# Patient Record
Sex: Female | Born: 1980
Health system: Midwestern US, Academic
[De-identification: ages and names within clinical notes are randomized; demographics above are authoritative.]

## PROBLEM LIST (undated history)

## (undated) DIAGNOSIS — E119 Type 2 diabetes mellitus without complications: Secondary | ICD-10-CM

## (undated) DIAGNOSIS — M1990 Unspecified osteoarthritis, unspecified site: Secondary | ICD-10-CM

## (undated) DIAGNOSIS — S92352A Displaced fracture of fifth metatarsal bone, left foot, initial encounter for closed fracture: Secondary | ICD-10-CM

## (undated) DIAGNOSIS — N939 Abnormal uterine and vaginal bleeding, unspecified: Secondary | ICD-10-CM

## (undated) DIAGNOSIS — D497 Neoplasm of unspecified behavior of endocrine glands and other parts of nervous system: Secondary | ICD-10-CM

## (undated) DIAGNOSIS — N83202 Unspecified ovarian cyst, left side: Secondary | ICD-10-CM

## (undated) HISTORY — PX: OTHER SURGICAL HISTORY: SHX169

## (undated) MED FILL — OXYCODONE-ACETAMINOPHEN 5-325 TABS: 5-325 MG | 7 days supply | Qty: 28 | Fill #0 | Status: AC

---

## 2006-06-20 NOTE — Unmapped (Signed)
Signed by   LinkLogic on 06/20/2006 at 13:59:37    Appointment status changed to no show by  LinkLogic on 06/20/2006 1:59 PM.    No Show Comments  ----------------  (ABI) RE-EVALUATION    Appointment Information  -----------------------  Appt Type:         Date:  Monday, June 20, 2006       Time:  1:20 PM for 20 min    Urgency:  Routine    Made By:  LinkLogic   To Visit:  Jamise Pentland L MD     Reason:  (ABI) RE-EVALUATION    Appt Comments  -------------  -- 06/20/06 13:59: (ROYSEMM) NO SHOW --  (ABI) RE-EVALUATION  3 MONTH F/U    -- 03/21/06 15:54: (ROYSEMM) BOOKED --  Routine  at 06/20/2006 1:20 PM for 20 min  (ABI) RE-EVALUATION  3 MONTH F/U

## 2006-07-01 NOTE — Unmapped (Signed)
Signed by   LinkLogic on 07/01/2006 at 05:23:07  Patient: Anne Goodwin  Note: All result statuses are Final unless otherwise noted.    Tests: (1)  (MR)    Order Note:                                        THE [facility]     PATIENT NAME:   Anne Goodwin, Anne Goodwin              MR #:  [id number]  DATE OF BIRTH:  Oct 25, 1981                         ACCOUNT #:  [id number]  ED PHYSICIAN:   Cherly Anderson, M.D.                  ROOM #:  PRIMARY:        [name]. [name], M.D.         NURSING UNIT:  ED  REFERRING:      [name]. [name], M.D.         [facility] : [facility]:  D  DICTATED BY:    Cherly Anderson, M.D.                  ADMIT DATE:  07/01/2006  VISIT DATE:     07/01/2006                         DISCHARGE DATE:                           EMERGENCY DEPARTMENT DISCHARGE NOTE     TIME OF INITIAL EVALUATION:  4:00 a.m.     FINAL IMPRESSION:     1.  Dental pain with a fractured tooth.     HISTORY OF PRESENT ILLNESS:  The patient is a 25 year old patient who  presented to the emergency department with tooth pain.  She has had  intermittent tooth pain of her right molar which has been going on for the  last two to three month period.  She denies any fevers or chills, no  shortness of breath, denies difficulty with breathing.  Denies any problems  with dizziness or lightheadedness.  Denies any problem passing out, blacking  out, fevers or chills.  She has had progressive problems with pain and  discomfort over her right jaw and molar area.  For that reason she came to  the ER to be seen today.     ALLERGIES:  None.     MEDICATIONS:  None.     PAST MEDICAL HISTORY:     1.  Last menstrual flow was one month ago.     SOCIAL HISTORY:  The patient does smoke.  Denies alcohol or drugs of abuse.     FAMILY HISTORY:  Negative for diabetes, coronary artery disease.     REVIEW OF SYSTEMS:  All other review of systems as it pertains to the  patient's chief complaint are negative and noncontributory.     PHYSICAL EXAMINATION:     VITAL SIGNS:   Temperature 98, blood pressure 120/70, pulse 61, respirations  18.  GENERAL:  Well-developed, well-nourished 25 year old.  Awake, alert, lucid,  cooperative with examination.  HEENT:  TMs are clear.  Nares are patent.  Oral mucosa:  She does have right  molar area of focal tenderness on examination.  No signs of any erythema.  No  signs of acute infection and her airway is patent.  Trachea is midline.     EMERGENCY DEPARTMENT COURSE:  Here in the ER after initial history and  physical here, the patient was given Toradol for pain.  She will be given  outpatient treatment with penicillin, Motrin, Tylenol #3 for breakthrough  pain.     PLAN:     1. Outpatient follow with dental clinic.  I have given her a dental clinic     referral.  2. Follow-up as stated above for further care and treatment as outpatient.                                                       ________________________________________  AW/vwf                                ____  D:  07/01/2006 04:56                  Cherly Anderson, M.D.  T:  07/01/2006 05:15  Job #:  [id number]                              EMERGENCY DEPARTMENT DISCHARGE NOTE                                        COPY                   PAGE    1 of 1    Note: An exclamation mark (!) indicates a result that was not dispersed into   the flowsheet.  Document Creation Date: 07/01/2006 5:23 AM  _______________________________________________________________________    (1) Order result status: Final  Collection or observation date-time: 07/01/2006 00:00  Requested date-time:   Receipt date-time:   Reported date-time:   Referring Physician: Oletta Lamas  Ordering Physician:  Reviewed In [facility])  Specimen Source:   Source: DBS  Filler Order Number: [id number]  Lab site:

## 2006-08-01 NOTE — Unmapped (Signed)
Signed by   LinkLogic on 08/01/2006 at 16:43:47  Patient: Shasha CAMPBELL MOXLEY  Note: All result statuses are Final unless otherwise noted.    Tests: (1)  (MR)    Order Note:                                        THE [facility]     PATIENT NAME:   NIKCOLE, EISCHEID              MR #:  [id number]  DATE OF BIRTH:  02-15-1981                         ACCOUNT #:  [id number]  ED PHYSICIAN:   Berneda Rose, M.D.            ROOM #:  PRIMARY:        [name]. [name], M.D.         NURSING UNIT:  ED  REFERRING:      [name]. [name], M.D.         [facility] Hoops:  D  DICTATED BY:    Haskel Khan, P.A.               ADMIT DATE:  08/01/2006  VISIT DATE:                                        DISCHARGE DATE:                           EMERGENCY DEPARTMENT DISCHARGE NOTE     CHIEF COMPLAINT:  Tooth pain.     HISTORY OF PRESENT ILLNESS:  This is a 25 year old Caucasian female with a  past medical history significant for depression who presents to the emergency  department with the above complaint.  The patient states that over the last  couple of months she has had tooth pain from the back right upper molar,  keeps breaking off.  The patient states she was seen here approximately  three weeks to a month ago and given antibiotics and anti-inflammatories.  The patient states that these were helping, however, a few days ago she was  eating a Snickers ice cream when a portion fractured off more significantly.  The patient states she does have an appointment on October 24th with  [facility], however, would like another dental clinic list  to try for an earlier appointment.  The patient states that she ran out of  the anti-inflammatory and has had increased pain.  The patient denies any  fever or chills.  The patient also denies any nausea or vomiting.  The  patient denies any purulent drainage from this area or airway obstruction.  The patient stated to the triage nurse that she was having some signs  of  depression, however, denies any suicidal ideation to me and states that she  has discussed this with her primary physician who has set her up with Dr.  Adair Patter of psychiatry.  The patient had an appointment with Dr. Adair Patter on Friday  and was placed back on antidepressants.  The patient states she already  feels change.  The patient denies any suicidal or homicidal ideation to me.  PAST MEDICAL HISTORY:     1. Depression.     CURRENT MEDICATIONS:     1. Cymbalta 30 mg.  2. Clonazepam 0.5 mg as needed.     ALLERGIES:  No known drug allergies.     SOCIAL HISTORY:  The patient smokes ten cigarettes a day, denies the use of  alcohol, illicit or IV drugs.     FAMILY HISTORY:  Noncontributory.     REVIEW OF SYSTEMS:  As stated above, otherwise negative per patient.     PHYSICAL EXAMINATION:     VITAL SIGNS:  Blood pressure 107/69, pulse 104, repeat 92, respirations 16,  temperature 98.1, O2 saturation 98% on room air.  GENERAL:  This is a well-developed, well-nourished 25 year old female who is  alert and oriented x3 and appears to be in no acute distress.  The patient is  cooperative, communicates well and was ambulatory into the emergency  department.  SKIN:  Warm and dry to touch.  HEENT:  Normocephalic, atraumatic.  Eyes:  Equal, round and reactive to light  and accommodation.  Extraocular movements are intact bilaterally.  Conjunctivae are pink without discharge.  Sclerae are nonicteric.  TMs appear  clear.  Buccal mucosa is pink and moist.  Pharynx is without erythema or  exudate.  The patient overall has good dentition, however, has a few dental  caries noted.  The patient does have a portion of her upper right back second  molar in which a significant portion has fractured off.  The patient has no  gumline erythema or edema noted.  The patient has no purulent drainage noted.  The patient has no evidence of abscess, has a midline uvula with a patent  airway.  NECK:  Supple without lymphadenopathy.  Trachea is  midline.  LUNGS:  Clear to auscultation bilaterally.  No wheezing, rales or rhonchi  noted.  HEART:  Regular rate and rhythm.  No murmurs, rubs or gallops noted.  EXTREMITIES:  The patient moves all extremities without difficulty.  NEUROLOGIC:  Cranial nerves II-XII are intact.  Patient is alert and oriented  x3.  No deficits noted.     EMERGENCY DEPARTMENT COURSE:  The patient was examined by myself and  presented to my attending who also examined the patient.     IMPRESSION:  This is a 25 year old female who presents to the emergency  department with dental pain.  Upon examination, the patient does have a  portion of a back right upper molar which is fractured away most recently a  few days ago and for this I will discharge her with Pen-Vee K.  The patient  is currently three months pregnant and therefore cannot have  anti-inflammatories so I will discharge her with Tylenol No. 3 and have  warned her that this will cause drowsiness.  She is not to drive or drink  alcohol while taking.     DIAGNOSIS:     1. Dental pain.     PLAN:     Penicillin VK 500 mg.  1. Tylenol #3 quantity 15.  2. Warm salt water gargles.  3. Follow up with oral surgery.  A new list was given.  4. Return for any fever, swelling,  airway obstruction or other concerns.     DISPOSITION:  The patient was discharged home in stable condition.     The patient was also seen by Dr. Delford Field who agrees with this assessment and  plan.  _______________________________________  CB/ljw                                 _____  D:  08/01/2006 16:04                   Haskel Khan, P.A.  T:  08/01/2006 16:39  Job #:  [id number]                                         _______________________________________                                         _____                                         Berneda Rose, M.D.  c:   [name]. [name], M.D.                           EMERGENCY DEPARTMENT DISCHARGE  NOTE                                        COPY                   PAGE    1 of 1    Note: An exclamation mark (!) indicates a result that was not dispersed into   the flowsheet.  Document Creation Date: 08/01/2006 4:43 PM  _______________________________________________________________________    (1) Order result status: Final  Collection or observation date-time: 08/01/2006 00:00  Requested date-time:   Receipt date-time:   Reported date-time:   Referring Physician: Oletta Lamas  Ordering Physician:  Reviewed In [facility])  Specimen Source:   Source: DBS  Filler Order Number: [id number]  Lab site:

## 2006-08-01 NOTE — Unmapped (Signed)
Signed by   LinkLogic on 08/01/2006 at 16:28:57  Patient: Zahari CAMPBELL MOXLEY  Note: All result statuses are Final unless otherwise noted.    Tests: (1)  (MR)    Order Note:                                        THE [facility]     PATIENT NAME:   CHANAH, TIDMORE              MR #:  [id number]  DATE OF BIRTH:  [birth year]                         ACCOUNT #:  [id number]  ED PHYSICIAN:   Berneda Rose, M.D.            ROOM #:  PRIMARY:        [name]. [name], M.D.         NURSING UNIT:  ED  REFERRING:      [name]. [name], M.D.         FC:  D  DICTATED BY:    Berneda Rose, M.D.            ADMIT DATE:  08/01/2006  VISIT DATE:                                        DISCHARGE DATE:                           EMERGENCY DEPARTMENT DISCHARGE NOTE     ADDENDUM     Seen and examined the patient, discussed with the PA, agree with plan and  disposition.                                                                   ________________________________________  SWW/ljw                               ____  D:  08/01/2006 15:51                  Berneda Rose, M.D.  T:  08/01/2006 16:24  Job #:  [id number]                              EMERGENCY DEPARTMENT DISCHARGE NOTE                                        COPY                   PAGE    1 of 1    Note: An exclamation mark (!) indicates a result that was not dispersed into   the flowsheet.  Document Creation Date: 08/01/2006 4:28 PM  _______________________________________________________________________    (1) Order  result status: Final  Collection or observation date-time: 08/01/2006 00:00  Requested date-time:   Receipt date-time:   Reported date-time:   Referring Physician: Oletta Lamas  Ordering Physician:  Reviewed In Hospital [facility])  Specimen Source:   Source: DBS  Filler Order Number: [id number]  Lab site:

## 2006-08-10 NOTE — Unmapped (Signed)
Signed by   LinkLogic on 08/10/2006 at 11:27:14    Appointment status changed to no show by  LinkLogic on 08/10/2006 11:27 AM.    No Show Comments  ----------------  (ABI) FOLLOW UP    Appointment Information  -----------------------  Appt Type:         Date:  Wednesday, August 10, 2006       Time:  10:40 AM for 20 min    Urgency:  Routine    Made By:  LinkLogic   To Visit:  Cidney Kirkwood L MD     Reason:  (ABI) FOLLOW UP    Appt Comments  -------------  -- 08/10/06 11:27: (ROYSEMM) NO SHOW --  (ABI) FOLLOW UP  FOLLOW UP    -- 08/01/06 8:54: (FISCHETC) BOOKED --  Routine  at 08/10/2006 10:40 AM for 20 min  (ABI) FOLLOW UP  FOLLOW UP

## 2006-08-29 ENCOUNTER — Inpatient Hospital Stay: Primary: Family

## 2006-09-13 NOTE — Unmapped (Signed)
Signed by   LinkLogic on 09/13/2006 at 16:12:30    Appointment status changed to no show by  LinkLogic on 09/13/2006 4:12 PM.    No Show Comments  ----------------  (ABI) RE-EVALUATION    Appointment Information  -----------------------  Appt Type:         Date:  Tuesday, September 13, 2006       Time:  3:20 PM for 20 min    Urgency:  Routine    Made By:  LinkLogic   To Visit:  Darienne Belleau L MD     Reason:  (ABI) RE-EVALUATION    Appt Comments  -------------  -- 09/13/06 16:12: (PEAKDL) NO SHOW --  (ABI) RE-EVALUATION  F/U    -- 08/29/06 10:26: (ROYSEMM) BOOKED --  Routine  at 09/13/2006 3:20 PM for 20 min  (ABI) RE-EVALUATION  F/U

## 2006-10-20 ENCOUNTER — Inpatient Hospital Stay: Primary: Family

## 2006-12-15 NOTE — Unmapped (Signed)
Signed by   LinkLogic on 12/15/2006 at 15:47:11  Patient: Anne Goodwin  Note: All result statuses are Final unless otherwise noted.    Tests: (1) DIAG-C-SPINE 2 OR 3-VIEWS [phone])    Order NotePricilla Handler Order Number: [id number]    Non-EMR Ordering Provider: Octavia Bruckner     Order Note:     *** VERIFIED [facility]  Reason:  UPPER BACK PAIN  Dict.Staff: Fulton Reek [phone]  Dict.Res: Beryl Meager [id number]  Verified By: Fulton Reek        Ver: 12/15/06   3:47 pm  Exams:  DIAG-C-SPINE 2 OR 3-VIEWS      Thoracic spine, 2 views 12/15/2006.    Indication: Upper back pain.    Comparison: None.    Findings:    The thoracic spine is visualized from T4 to T12. No fracture or  malalignment is identified. Visualized portions of the lungs are  clear.    Impression:    No fractures or malalignment from T4 to T12 are seen.  **** end of result ****    Note: An exclamation mark (!) indicates a result that was not dispersed into   the flowsheet.  Document Creation Date: 12/15/2006 3:47 PM  _______________________________________________________________________    (1) Order result status: Final  Collection or observation date-time: 12/15/2006 10:02:21  Requested date-time: 12/15/2006 09:43:00  Receipt date-time:   Reported date-time: 12/15/2006 15:47:05  Referring Physician: Nelida Gores REFERRAL PT  Ordering Physician:  Non-EMR Physician [facility])  Specimen Source:   Source: QRS  Filler Order Number: [id number]  Lab site: [facility]
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
## 2006-12-15 NOTE — Unmapped (Signed)
Signed by   LinkLogic on 12/16/2006 at 06:02:41  Patient: Anne Goodwin  Note: All result statuses are Final unless otherwise noted.    Tests: (1) DIAG-T-SPINE 2-VIEWS [phone])    Order Note: Pricilla Handler Order Number: [id number]    Non-EMR Ordering Provider: Octavia Bruckner     Order Note:     *** VERIFIED [facility]  Reason:  UPPER BACK PAIN  Dict.Staff: Harvel Ricks [phone]  Dict.Res: Beryl Meager [id number]  Verified By: Harvel Ricks       Ver: 12/16/06   6:02 am  Exams:  DIAG-T-SPINE 2-VIEWS      Cervical spine, 3 views on 12/15/2006.    Indication: Upper back pain.    Comparison: None.    Findings:    The cervical spine is visualized in the skullbase to the  superior endplate of C7, C7 and T1 are incompletely seen. There  has been posterior atlantooccipital fusion using lateral mass  screws at C2. Cervical vertebral body height and alignment is  maintained from the skullbase through C6. No fractures are  identified. The dens is not visualized secondary to overlying  fixation hardware.    Impression cervical spine:    Limited cervical spine with no evidence of fracture or  malalignment from the skull base to C6, however the dens is not  seen secondary to overlying hardware. C7-T1 are excluded.  **** end of result ****    Note: An exclamation mark (!) indicates a result that was not dispersed into   the flowsheet.  Document Creation Date: 12/16/2006 6:02 AM  _______________________________________________________________________    (1) Order result status: Final  Collection or observation date-time: 12/15/2006 10:02:25  Requested date-time: 12/15/2006 09:43:00  Receipt date-time:   Reported date-time: 12/16/2006 06:02:36  Referring Physician: Nelida Gores REFERRAL PT  Ordering Physician:  Non-EMR Physician [facility])  Specimen Source:   Source: QRS  Filler Order Number: [id number]  Lab site: [facility]

## 2006-12-16 ENCOUNTER — Observation Stay
Admit: 2006-12-16 | Discharge: 2006-12-16 | Disposition: A | Discharge status: Home / Self Care | Source: Home / Self Care
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
## 2007-04-20 NOTE — Unmapped (Signed)
Signed by   [employer] on 04/20/2007 at 17:30:28  Patient: Anne Goodwin  Note: All result statuses are Final unless otherwise noted.    Tests: (1) DIAG-C-SPINE 2 OR 3-VIEWS [phone])    Order NotePricilla Handler Order Number: [id number]    Non-EMR Ordering Provider: Melody Comas      Order Note:     *** VERIFIED [facility]  Reason:  H/O SPINAL FUSION  Dict.Staff: Azucena Cecil [phone]  Dict.Res: Marjo Bicker [id number]  Verified By: Azucena Cecil        Ver: 04/20/07   5:30 pm  Exams:  DIAG-C-SPINE 2 OR 3-VIEWS      Three views of the cervical spine 04/20/2007    Indication: Spinal fusion    Comparison: 12/16/2006 and multiple prior studies.    Findings:  The cervical spine is visualized and skullbase through the top  of T1. Postsurgical changes are again noted with posterior  fusion of C2 to the base of the occiput. No acute fracture or  gross alignment is seen. There is straightening of the normal  cervical lordosis. No prevertebral soft tissue swelling is  present. 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    Impression:  Posterior fusion of C2 to the base of the occiput is again  noted. No fracture or malalignment.  **** end of result ****    Note: An exclamation mark (!) indicates a result that was not dispersed into   the flowsheet.  Document Creation Date: 04/20/2007 5:30 PM  _______________________________________________________________________    (1) Order result status: Final  Collection or observation date-time: 04/20/2007 17:27:16  Requested date-time: 04/20/2007 14:46:00  Receipt date-time:   Reported date-time: 04/20/2007 17:30:22  Referring Physician: Nelida Gores REFERRAL PT  Ordering Physician:  Non-EMR Physician [facility])  Specimen Source:   Source: QRS  Filler Order Number: [id number]  Lab site: [facility]
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
## 2007-04-24 NOTE — Unmapped (Signed)
Signed by   LinkLogic on 04/25/2007 at 00:11:38  Patient: Anne Goodwin  Note: All result statuses are Final unless otherwise noted.    Tests: (1)  (MR)    Order Note:                                   [facility]     PATIENT NAME:   Anne, Goodwin             MR #:  [id number]  DATE OF BIRTH:  Feb 11, 1981                        ACCOUNT #:  [id number]  ED PHYSICIAN:   Frederik Schmidt, M.D.       ROOM #:  PRIMARY:        [name]. [name], M.D.        NURSING UNIT:  ED  REFERRING:      Referring Nonstaff                FC:  S  DICTATED BY:    Misty Stanley (Kym Groom) Shadix, P.A.     ADMIT DATE:  04/24/2007  VISIT DATE:     04/24/2007                        DISCHARGE DATE:                           EMERGENCY DEPARTMENT DISCHARGE NOTE     CHIEF COMPLAINT:     1. Headache.  2. Neck pain.     HISTORY OF PRESENT ILLNESS:  This is a 26 year old female who presents with a  migraine headache over the last six hours.  The patient reports she has had a  history of headaches previously and this one is in her right frontal area.  She states she does have photophobia as well some slight nausea but denies  any vomiting.  The patient also complains of some neck pain which has been  occurring over the last several years.  She states she was seen here for this  last week and at that time given Valium and ibuprofen.  She states she was  only given six Valium and is now out of that.  She has been attempting to  make appointments with community clinics but they are unable to see her until  the end of July.  She denies any numbness, tingling or weakness of her  extremities.  She denies any bowel or bladder incontinence or retention.     PAST MEDICAL HISTORY:     1. Bipolar disorder.  2. C1-C2 fusion.     MEDICATIONS:     1. Klonopin.  2. Lithium.  3. Seroquel.  4. Remeron.  5. Ibuprofen.  6. Valium.     ALLERGIES:     1. Latex.     FAMILY HISTORY:  Noncontributory.     SOCIAL HISTORY:  Smokes half a pack of  cigarettes per day but denies alcohol  or illicit drug use.     REVIEW OF SYSTEMS:  As above, otherwise negative per patient.     PHYSICAL EXAMINATION:     VITAL SIGNS:  Blood pressure 116/70, pulse 90, respirations 16, temperature  98.8, and O2 saturation 98% on room air.  GENERAL:  This  is a well-developed, well-nourished female in no acute  distress.  HEENT:  Normocephalic and atraumatic.  PERRLA.  EOMI.  Posterior oropharynx  is pink and moist without erythema or exudate.  NECK:  Supple.  No midline tenderness but she does have some right-sided  paraspinous muscle tenderness and slight spasm noted.  HEART:  Regular rate and rhythm.  No murmurs, gallops or rubs.  LUNGS:  Clear to auscultation bilaterally.  EXTREMITIES:  She has full range of motion of all extremities with 2+ pulses  in all extremities.  No clubbing, cyanosis or edema.  NEUROLOGIC:  She is alert and oriented x 4.  Gross sensation is intact.  Strength is 5/5 bilaterally.     EMERGENCY DEPARTMENT COURSE:  The patient's nursing notes and [facility]  records were reviewed.  She received two Percocet po for pain as well as 60  mg of Toradol IM.     IMPRESSION/MEDICAL DECISION MAKING:  The patient presents with a headache  that started this morning which she believes was due to her chronic neck  pain.  There is no evidence of any infectious etiology of her headache and  there are no meningeal signs.  She will be discharged with some Fioricet for  her pain.  In addition, she states she is out of her Valium and needs some  type of muscle relaxer for her neck pain.  I advised her that we do not  refill Valium here in the emergency department but I would discharge her with  some Flexeril.  She is advised to keep her appointment at the end of July.     DIAGNOSES:     1. Cephalgia.  2. Neck pain.     PLAN:     1. The patient is given Fioricet number 12 and Flexeril number 30.  2. She is to apply ice or heat as needed.  3. Follow up with the community clinic as  scheduled.  4. Return to the ER for any worsening symptoms.     DISPOSITION:  The patient was discharged home in good condition.     The patient was also seen and evaluated by Dr. Clarene Critchley who agrees with the  assessment and plan.                                                                ______________________________________  SS/sm                                 ______  D:  04/24/2007 17:55                  Stacey (Kym Groom) [employer], P.A.  T:  04/24/2007 23:55  Job #:  [id number]                                        ______________________________________                                        ______  Frederik Schmidt, M.D.                              EMERGENCY DEPARTMENT DISCHARGE NOTE                                       COPY                    PAGE    1 of   1    Note: An exclamation mark (!) indicates a result that was not dispersed into   the flowsheet.  Document Creation Date: 04/25/2007 12:11 AM  _______________________________________________________________________    (1) Order result status: Final  Collection or observation date-time: 04/24/2007 00:00  Requested date-time:   Receipt date-time:   Reported date-time:   Referring Physician: Referring Nonstaff  Ordering Physician:  Reviewed In Hospital [facility])  Specimen Source:   Source: DBS  Filler Order Number: [id number]  Lab site:

## 2007-04-25 NOTE — Unmapped (Signed)
THE [facility]     PATIENT NAME:   Anne Goodwin, Anne Goodwin             MR #:  [id number]   DATE OF BIRTH:  [birth year]                        ACCOUNT #:  [id number]   ED PHYSICIAN:   Frederik Schmidt, M.D.       ROOM #:   PRIMARY:        [name]. [name], M.D.        NURSING UNIT:  ED   REFERRING:      Referring Nonstaff                FC:  S   DICTATED BY:    Misty Stanley (Kym Groom) Alyx Gee, P.A.     ADMIT DATE:  04/24/2007   VISIT DATE:     04/24/2007                        DISCHARGE DATE:                           EMERGENCY DEPARTMENT DISCHARGE NOTE     CHIEF COMPLAINT:     1. Headache.   2. Neck pain.     HISTORY OF PRESENT ILLNESS:  This is a 26 year old female who presents with a   migraine headache over the last six hours.  The patient reports she has had a   history of headaches previously and this one is in her right frontal area.   She states she does have photophobia as well some slight nausea but denies   any vomiting.  The patient also complains of some neck pain which has been   occurring over the last several years.  She states she was seen here for this   last week and at that time given Valium and ibuprofen.  She states she was   only given six Valium and is now out of that.  She has been attempting to   make appointments with community clinics but they are unable to see her until   the end of July.  She denies any numbness, tingling or weakness of her   extremities.  She denies any bowel or bladder incontinence or retention.     PAST MEDICAL HISTORY:     1. Bipolar disorder.   2. C1-C2 fusion.     MEDICATIONS:     1. Klonopin.   2. Lithium.   3. Seroquel.   4. Remeron.   5. Ibuprofen.   6. Valium.     ALLERGIES:     1. Latex.     FAMILY HISTORY:  Noncontributory.     SOCIAL HISTORY:  Smokes half a pack of cigarettes per day but denies alcohol   or illicit drug use.     REVIEW OF SYSTEMS:  As above, otherwise negative per patient.     PHYSICAL EXAMINATION:     VITAL  SIGNS:  Blood pressure 116/70, pulse 90, respirations 16, temperature   98.8, and O2 saturation 98% on room air.   GENERAL:  This is a well-developed, well-nourished female in no acute   distress.   HEENT:  Normocephalic and atraumatic.  PERRLA.  EOMI.  Posterior oropharynx   is pink and moist without erythema or exudate.   NECK:  Supple.  No midline tenderness  but she does have some right-sided   paraspinous muscle tenderness and slight spasm noted.   HEART:  Regular rate and rhythm.  No murmurs, gallops or rubs.   LUNGS:  Clear to auscultation bilaterally.   EXTREMITIES:  She has full range of motion of all extremities with 2+ pulses   in all extremities.  No clubbing, cyanosis or edema.   NEUROLOGIC:  She is alert and oriented x 4.  Gross sensation is intact.   Strength is 5/5 bilaterally.     EMERGENCY DEPARTMENT COURSE:  The patient's nursing notes and [facility]   records were reviewed.  She received two Percocet po for pain as well as 60   mg of Toradol IM.     IMPRESSION/MEDICAL DECISION MAKING:  The patient presents with a headache   that started this morning which she believes was due to her chronic neck   pain.  There is no evidence of any infectious etiology of her headache and   there are no meningeal signs.  She will be discharged with some Fioricet for   her pain.  In addition, she states she is out of her Valium and needs some   type of muscle relaxer for her neck pain.  I advised her that we do not   refill Valium here in the emergency department but I would discharge her with   some Flexeril.  She is advised to keep her appointment at the end of July.     DIAGNOSES:     1. Cephalgia.   2. Neck pain.     PLAN:     1. The patient is given Fioricet number 12 and Flexeril number 30.   2. She is to apply ice or heat as needed.   3. Follow up with the community clinic as scheduled.   4. Return to the ER for any worsening symptoms.     DISPOSITION:  The patient was discharged home in good condition.     The  patient was also seen and evaluated by Dr. Clarene Critchley who agrees with the   assessment and plan.                                                         ______________________________________   SS/sm                                 ______   D:  04/24/2007 17:55                  Lylianna Fraiser (Kym Groom) Sumpter, P.A.   T:  04/24/2007 23:55   Job #:  [id number]                                         ______________________________________                                         ______  Frederik Schmidt, M.D.                             EMERGENCY DEPARTMENT DISCHARGE NOTE                                        COPY                    PAGE    1 of   1                                         Frederik Schmidt, M.D.                             EMERGENCY DEPARTMENT DISCHARGE NOTE                                        COPY                    PAGE    1 of   1

## 2007-05-04 ENCOUNTER — Inpatient Hospital Stay: Primary: Family

## 2007-05-09 ENCOUNTER — Inpatient Hospital Stay: Primary: Family

## 2007-05-09 LAB — LIPID PANEL
Cholesterol, Total: 196 mg/dL (ref 125–200)
HDL: 40 mg/dL (ref >=40)
LDL Cholesterol: 88 mg/dL (ref <130)
Triglycerides: 338 mg/dL — ABNORMAL HIGH (ref <150)

## 2007-05-09 LAB — TSH: TSH: 1.27 u[IU]/mL

## 2007-05-09 LAB — LITHIUM LEVEL: Lithium Lvl: 0.8 meq/L (ref 0.5–1.3)

## 2007-05-09 NOTE — Unmapped (Signed)
Signed by   LinkLogic on 05/09/2007 at 20:56:57  Patient: Anne Goodwin  Note: All result statuses are Final unless otherwise noted.    Tests: (1) LIPID PROFILE (FATS)    Triglyceride         [H]  338 mg/dL                   <093      Test performed at [facility]      [address]      [place], [place]  [address]      Anselm Pancoast BECKER,D.O.    Cholesterol               196 mg/dL                   254-270      Test performed at [facility]      [address]      [place], [place]  [address]      Anselm Pancoast BECKER,D.O.    HDL                       40 mg/dL                    > OR = 40      Test performed at [facility]      [address]      [place], [place]  [address]      Anselm Pancoast BECKER,D.O.    LDL, calc                 88 mg/dL (calc)             <710      DESIRABLE RANGE <100 MG/DL FOR PATIENTS WITH CHD OR      DIABETES AND <70 MG/DL FOR DIABETIC PATIENTS WITH      KNOWN HEART DISEASE.      Test performed at [facility]      [address]      [place], [place]  [address]      Anselm Pancoast BECKER,D.O.      The value 88 was changed by IF on 05/09/2007 20:56 from: no value  ! CHOL/HDLC RATIO           4.9 (calc)                  < OR = 5.0      Test performed at [facility]      [address]      [place], [place]  [address]      Anselm Pancoast BECKER,D.O.      The value 4.9 was changed by IF on 05/09/2007 20:56 from: no value    Note: An exclamation mark (!) indicates a result that was not dispersed into   the flowsheet.  Document Creation Date: 05/09/2007 8:56 PM  _______________________________________________________________________    (1) Order result status: Final  Collection or observation date-time: 05/09/2007 12:26  Requested date-time: 05/09/2007 12:26  Receipt date-time: 05/09/2007 20:56  Reported date-time: 05/09/2007 20:56  Referring Physician:    Ordering Physician: Gardiner Coins [phone])  Specimen  Source: S&SERUM     SST  ROOM T&SST (serum Rm Tmp)  Source: Butler Denmark Order Number: [id number]  Lab site: [facility]      [address]      [place] [address]  [phone]      -----------------  The following lab values were dispersed to the flowsheet  with no units conversion:      LDL, calc, 88 MG/DL (CALC), (F)  expected units: mg/dL

## 2007-05-09 NOTE — Unmapped (Signed)
Signed by   LinkLogic on 05/10/2007 at 01:43:39  Patient: Anne Goodwin  Note: All result statuses are Final unless otherwise noted.    Tests: (1) LITHIUM (LI)    Lithium                   0.8 mEq/L                   0.5-1.3      Test performed at [facility]      [address]      [place], [place]  [address]      Anselm Pancoast BECKER,D.O.    Note: An exclamation mark (!) indicates a result that was not dispersed into   the flowsheet.  Document Creation Date: 05/10/2007 1:43 AM  _______________________________________________________________________    (1) Order result status: Final  Collection or observation date-time: 05/09/2007 12:26  Requested date-time: 05/09/2007 12:26  Receipt date-time: 05/10/2007 01:43  Reported date-time: 05/10/2007 01:43  Referring Physician:    Ordering Physician: Gardiner Coins [phone])  Specimen Source: S&SERUM     SST  ROOM T&SST (serum Rm Tmp)  Source: Butler Denmark Order Number: [id number]  Lab site: [facility]      [address]      [place] [address]  [phone]

## 2007-05-09 NOTE — Unmapped (Signed)
Signed by Lindwood Qua, LISW-S on 05/10/2007 at 09:08:27    Demographic Update     Social Work Consultation   Referred by: Lorenza Chick, NP  Practice: [facility]  Reason for Referral:     Assessment and referral to mental health resource  Assessment:  Living Situation/Support System     This 26 y.o. currently resides with her father in [place]. A former cocaine and M.J. user, she participates in [facility] outpatient drug treatment, having recently completed their 48 day [facility]) program.  Sees counselor daily and attends daily group sessions.      Has 2 daughters: 50 month old, Tora Perches, who is in the custody of her biological father, and 58 month old, Guadlupe Spanish, who has spina bifida and is in foster care.  Children's Services is involved.    Ms. Bonsall was diagnosed with Bipolar Affective Disorder while at [facility] and sees psychiatrist-Dr. Lu-who prescribes psychotropics. Reports she has been feeling much better since starting her psychotropic medications.  Frequents [facility] for neck pain, dental pain, headaches.  Most recently in [facility] yesterday because she ran out of her psych medications and unable to reach her psychiatrist.  Social work contact with Alinda Dooms, LISW, in [facility], reflects patient was given 2 week supply of Seroquel and Remeron and referred to [facility] for ongoing mental health services following completion of her [facility] program.  Patient reports that she has already contacted [facility] and is waitlisted for services there.      The youongest of 3 siblings, Ms. Campbell-Moxley supports herself on food stamps.  Her father, friends assist with purchasing gasoline, other needs. Father is said to be alcoholic.   IDENTIFIED NEEDS    Case Management/Care Coord.   Chemical Dependency   Mental Health/Dev. Disability  SOCIAL WORK INTERVENTIONS    Brief Counseling/Support   Publishing copy)   Education/Information  Other  interventions:  Strongly encouraged to get her psych med refills through her [facility] psychiatrist  SOCIAL WORK FOLLOW UP PLAN    No further intervention needed.   Patient/family to follow up with SW as needed.  Given phone #.  ADDITIONAL COMMENTS; RECOMMENDATIONS      Encouraged follow through on [facility] mental health services.   Pt/Family aware of options   Patient/Family participated in decision making/plan   Discussed with MD/staff      Today's Orders   Complex Data Collection   (2 points) [IMS-11111]  Emotional Support  (1 point) [IMS-11111]  Multiple Topics  (2 points) [IMS-11111]

## 2007-05-09 NOTE — Unmapped (Signed)
Signed by   LinkLogic on 05/10/2007 at 01:12:12  Patient: Anne Goodwin  Note: All result statuses are Final unless otherwise noted.    Tests: (1) THYROID STIMULATING HORMONE (TSH)    TSH, 3rd Generation       1.27 mIU/L      REFERENCE RANGE:       > OR = 20 YEARS: 0.40-4.50            PREGNANCY RANGES       FIRST TRIMESTER    0.20-4.70       SECOND TRIMESTER   0.30-4.10       THIRD TRIMESTER    0.40-2.70      Test performed at [facility]      [address]      [place], [place]  [address]      Anselm Pancoast BECKER,D.O.    Note: An exclamation mark (!) indicates a result that was not dispersed into   the flowsheet.  Document Creation Date: 05/10/2007 1:12 AM  _______________________________________________________________________    (1) Order result status: Final  Collection or observation date-time: 05/09/2007 12:26  Requested date-time: 05/09/2007 12:26  Receipt date-time: 05/10/2007 01:11  Reported date-time: 05/10/2007 01:11  Referring Physician:    Ordering Physician: Gardiner Coins [phone])  Specimen Source: S&SERUM     SST  ROOM T&SST (serum Rm Tmp)  Source: Butler Denmark Order Number: [id number]  Lab site: [facility]      [address]      [place] [address]  [phone]

## 2007-07-04 ENCOUNTER — Inpatient Hospital Stay: Primary: Family

## 2007-07-06 ENCOUNTER — Inpatient Hospital Stay: Primary: Family
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
## 2007-07-10 NOTE — Unmapped (Signed)
Signed by Gardiner Coins NP on 07/10/2007 at 10:52:00    Phone Note   Patient Call  Call back at Home Phone: [phone]  Caller: patient  Call for: Kwinton Maahs    Reason for Call: speak with provider  Summary of Call: seen week ago. verapmil makes me dizzy. on lithium for bipolar. was told it can decrease my lithium level. ankle pain and knee pain and shoulder pain. not want to take it anymore. i don't like the way it makes me feel. started new job at [employer] so stand on feet all day. what to do? can i stop the med?     Initial call taken by: Olin Pia RN,  July 10, 2007 10:20 AM      Follow-up for Phone Call   Spoke with patient regarding her new medications.  Plan is to stop Verapimil, Ibuprofen and Excedrin Migraine as they can all interfere with Litihium.  Pt has no suicidal ideation, but she does feel more on edge (her friends around her have noticed this).  Advised pt to make f/u appt to see [place] and also to f/u with he rtherapist and psychiatrist as asap.  She has appt with [facility] 09/24, asked pt to call for sooner appt.   phone call completed  Follow-up by: Gardiner Coins NP,  July 10, 2007 10:51 AM

## 2007-07-12 ENCOUNTER — Inpatient Hospital Stay: Primary: Family
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
## 2007-07-20 NOTE — Unmapped (Signed)
Signed by Gardiner Coins NP on 07/21/2007 at 18:35:27    Phone Note   Patient Call  Caller: patient  Call for: Anne Goodwin    Reason for Call: acute illness  Reason for call: headaches  Complaint: headache  Summary of Call: pt having bad headaches more in the morning and at night what to do waiting to hear from headache clinic     Initial call taken by: Merri Ray,  July 20, 2007 9:43 AM      New Medications:  AMITRIPTYLINE HCL 25 MG TABS (AMITRIPTYLINE HCL) Take half a tab at bedtime for one week then start taking one whole tab qhs    Follow-up for Phone Call   [facility],  can you call the patient back and tell her that if she is having these bad of headaces and they are intolerable, she can go to the ER, or if she wants she can come in for a visit to be seen on the acute list.  Follow-up by: Gardiner Coins NP,  July 20, 2007 10:56 AM    Additional Follow-up for Phone Call   pt called back, she wants to go to pain clinic - she got a call and was told it would take 4 month to get an appoint. She is wondering if you talk to the pain clinic yet. She want to talk to [name]. She can't just go to the er she has a child to take care of.  please call [phone]  Additional Follow-up by: Aurea Graff,  July 21, 2007 1:47 PM    Additional Follow-up for Phone Call   Called patient at [phone], emphasized pt trying to obtain tax levy so we can get her into headache clinic, pm&r.  Called in elavil for patient for headache prevention.  Discussed side effects.  Pt has appt with psychiatry 09/24.  Pt to stop medications if she feels manic/moody.  Warned that headache prevention may take a few weeks.  phone call completed  Additional Follow-up by: Gardiner Coins NP,  July 21, 2007 6:21 PM    Prescriptions:  AMITRIPTYLINE HCL 25 MG TABS (AMITRIPTYLINE HCL) Take half a tab at bedtime for one week then start taking one whole tab qhs  #30 x 1   Entered and Authorized by: Gardiner Coins NP   Signed  by: Gardiner Coins NP on 07/21/2007   Method used: Telephoned to ...        RxID: [id number]

## 2007-08-07 ENCOUNTER — Inpatient Hospital Stay: Primary: Family

## 2007-10-20 NOTE — Unmapped (Signed)
Signed by   LinkLogic on 10/20/2007 at 16:07:24    Appointment status changed to no show by  LinkLogic on 10/20/2007 4:07 PM.    No Show Comments  ----------------      Appointment Information  -----------------------  Appt Type:         Date:  Friday, October 20, 2007       Time:  2:20 PM for 20 min    Urgency:  Routine    Made By:  Jannett Celestine   To Visit:  Gardiner Coins NP     Reason:      Appt Comments  -------------  -- 10/20/07 16:07: ([facility]) NO SHOW --  ;    -- 10/20/07 6:11: [facility]) BOOKED --  Routine  at 10/20/2007 2:20 PM for 20 min  migraines, neck pain; per pt- will bring required fanan. info    -- 10/20/07 6:11: [facility]) BOOKED --  Routine  at 10/20/19

## 2008-07-24 NOTE — Unmapped (Signed)
Signed by   LinkLogic on 08/03/2008 at 04:27:50  Patient: Anne Goodwin  Note: All result statuses are Final unless otherwise noted.    Tests: (1)  (MR)    Order Note:                                   [facility]     PATIENT NAME:   NABA, SNEED             MR #:  [id number]  DATE OF BIRTH:  Mar 20, 1981                        ACCOUNT #:  [id number]  ED PHYSICIAN:   Deno Lunger. Clelia Croft, M.D.              ROOM #:  PRIMARY:        Referring Nonstaff                NURSING UNIT:  ED  REFERRING:      Selected Referral Pt              FC:  S  DICTATED BY:    Deno Lunger. Clelia Croft, M.D.              ADMIT DATE:  07/24/2008  VISIT DATE:     07/24/2008                        DISCHARGE DATE:                           EMERGENCY DEPARTMENT DISCHARGE NOTE        ADDENDUM, 08/02/2008, gs.     The patient was seen and examined, discussed with resident.  I agree with  plan.                                                             _______________________________________  GJS/mm                                 _____  D:  08/02/2008 17:36                  Deno Lunger. Clelia Croft, M.D.  T:  08/03/2008 04:16  Job #:  [id number]                                 EMERGENCY DEPARTMENT DISCHARGE NOTE                                                               PAGE    1 of   1    Note: An exclamation mark (!) indicates a result that was not dispersed into   the flowsheet.  Document Creation Date: 08/03/2008 4:27 AM  _______________________________________________________________________    (  1) Order result status: Final  Collection or observation date-time: 07/24/2008 00:00  Requested date-time:   Receipt date-time:   Reported date-time:   Referring Physician: Selected Pt  Ordering Physician:  Reviewed In Hospital [facility])  Specimen Source:   Source: DBS  Filler Order Number: [id number]  Lab site:

## 2008-07-24 NOTE — Unmapped (Signed)
THE [facility]     PATIENT NAME:   Anne Goodwin, Anne Goodwin             MR #:  [id number]   DATE OF BIRTH:  1980/12/22                        ACCOUNT #:  [id number]   ED PHYSICIAN:   Deno Lunger. Clelia Croft, M.D.              ROOM #:   PRIMARY:        Referring Nonstaff                NURSING UNIT:  ED   REFERRING:      Selected Referral Pt              FC:  S   DICTATED BY:    Kenichi Cassada H. Ralph Leyden, M.D.            ADMIT DATE:  07/24/2008   VISIT DATE:                                       DISCHARGE DATE:                           EMERGENCY DEPARTMENT DISCHARGE NOTE       R4 RESIDENT PHYSICIAN:  Dr. Lily Peer.     HISTORY OF PRESENT ILLNESS:  This is a 27 year old female who comes into the   emergency department complaining of Heroin withdrawal.  The patient states   that she has been an opiate abuser for years now and she has not had any   Heroin for the last two days.  When asked how much she normally did in the   past, she admits to one to two grams of Heroin, which she would sniff.  She   admits also to smoking 10 cigarettes a day.  No alcohol.  She admits to no   other drug use.  Today she says that she is feeling nauseous.   She has not   yet vomited or had any diarrhea.  She does feel tremulous and shaky.  She has   been sweating and has a very runny nose.  She does not have any chest pain or   any shortness of breath.  She denies diarrhea or vomiting.  She denies any   headache or blurry vision.  She does have some cramping abdominal pain and   nothing has been making her feel better in the last two days.     PAST MEDICAL HISTORY:     1. Bipolar disorder.   2. Denies any other medical history.     ALLERGIES: She denies any allergies.     FAMILY HISTORY:  Noncontributory.     SOCIAL HISTORY:  She smokes 10 cigarettes per day.  She denies alcohol use.   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  No other drugs.     MEDICATIONS:     1. The patient admits to being on Seroquel.   2. Ritalin.   3.  Valium.  (Unknown doses.)     REVIEW OF SYSTEMS:  She admits to nausea.  No vomiting.  No diarrhea.  She   admits to tremulousness, shakiness, sweating and a runny nose.  She denies   any other symptoms.  PHYSICAL EXAMINATION:     VITAL SIGNS:  The patient has a blood pressure 128/86.  Pulse of 100.   Respirations 18.  Temperature 97.2.  Oxygen saturation is 98% on room air.   GENERAL:  This is a 27 year old female who is lying in bed and looking in no   apparent distress though in some discomfort.   HEENT:  The patient's eyes show extraocular movements are intact.  Her pupils   are equally reactive to light.  ENT:  Her tympanic membranes show no bulging   and no retraction.  Pharynx shows no erythema, no edema and no exudates.   NECK:  Supple.  No JVD.  No lymphadenopathy.   RESPIRATORY:  Clear to auscultation bilaterally.   CARDIOVASCULAR:  S1, S2.  Regular rate and rhythm.   GASTROINTESTINAL:  Bowel sounds are in all four quadrants.  Nontender.   Nondistended.   NEUROLOGIC:  Cranial nerves VII through XII are intact.  No deficits.     LABORATORY DATA/RADIOLOGICAL STUDIES:  None.     EKG:  EKG does show no acute findings though it does show chronic findings of   right atrial hypertrophy, some right axis deviation and S-wave enlargement   showing possible chronic pulmonary disease.     EMERGENCY DEPARTMENT COURSE:  This is a 27 year old female who comes in   looking to feel better during Heroin withdrawal.   The patient is having   symptoms of nausea, shakiness, sweating and runny nose.  While in the   emergency department she was given 1 liter bolus of IV fluid.  She was also   given 25 mg of Phenergan IV.  The patient's vitals were monitored and   remained stable.     DISPOSITION:  At this point, I am going to end the dictation and the   dictation will be  followed up with an addendum.                                                 _______________________________________   VHD/rc                                  _____   D:  07/24/2008 06:17                  Erie Noe H. Ralph Leyden, M.D.   T:  07/24/2008 14:26   Job #:  [id number]                                         _______________________________________                                          _____                                         Deno Lunger. Liliane Shi.D.  EMERGENCY DEPARTMENT DISCHARGE NOTE                                                                PAGE    1 of   1                                          _____                                         Deno Lunger. Clelia Croft, M.D.                             EMERGENCY DEPARTMENT DISCHARGE NOTE                                                                PAGE    1 of   1

## 2008-07-24 NOTE — Unmapped (Signed)
Signed by   LinkLogic on 07/24/2008 at 14:41:29  Patient: Anne Goodwin  Note: All result statuses are Final unless otherwise noted.    Tests: (1)  (MR)    Order Note:                                   [facility]     PATIENT NAME:   SALIA, CANGEMI             MR #:  [id number]  DATE OF BIRTH:  1981-04-13                        ACCOUNT #:  [id number]  ED PHYSICIAN:   Deno Lunger. Clelia Croft, M.D.              ROOM #:  PRIMARY:        Referring Nonstaff                NURSING UNIT:  ED  REFERRING:      Selected Referral Pt              FC:  S  DICTATED BY:    Vanessa H. Ralph Leyden, M.D.            ADMIT DATE:  07/24/2008  VISIT DATE:                                       DISCHARGE DATE:                           EMERGENCY DEPARTMENT DISCHARGE NOTE        R4 RESIDENT PHYSICIAN:  Dr. Lily Peer.     HISTORY OF PRESENT ILLNESS:  This is a 27 year old female who comes into the  emergency department complaining of Heroin withdrawal.  The patient states  that she has been an opiate abuser for years now and she has not had any  Heroin for the last two days.  When asked how much she normally did in the  past, she admits to one to two grams of Heroin, which she would sniff.  She  admits also to smoking 10 cigarettes a day.  No alcohol.  She admits to no  other drug use.  Today she says that she is feeling nauseous.   She has not  yet vomited or had any diarrhea.  She does feel tremulous and shaky.  She has  been sweating and has a very runny nose.  She does not have any chest pain or  any shortness of breath.  She denies diarrhea or vomiting.  She denies any  headache or blurry vision.  She does have some cramping abdominal pain and  nothing has been making her feel better in the last two days.     PAST MEDICAL HISTORY:     1. Bipolar disorder.  2. Denies any other medical history.     ALLERGIES: She denies any allergies.     FAMILY HISTORY:  Noncontributory.     SOCIAL HISTORY:  She smokes 10 cigarettes per day.   She denies alcohol use.  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  No other drugs.     MEDICATIONS:     1. The patient admits to being  on Seroquel.  2. Ritalin.  3. Valium.  (Unknown doses.)     REVIEW OF SYSTEMS:  She admits to nausea.  No vomiting.  No diarrhea.  She  admits to tremulousness, shakiness, sweating and a runny nose.  She denies  any other symptoms.     PHYSICAL EXAMINATION:     VITAL SIGNS:  The patient has a blood pressure 128/86.  Pulse of 100.  Respirations 18.  Temperature 97.2.  Oxygen saturation is 98% on room air.  GENERAL:  This is a 27 year old female who is lying in bed and looking in no  apparent distress though in some discomfort.  HEENT:  The patient's eyes show extraocular movements are intact.  Her pupils  are equally reactive to light.  ENT:  Her tympanic membranes show no bulging  and no retraction.  Pharynx shows no erythema, no edema and no exudates.  NECK:  Supple.  No JVD.  No lymphadenopathy.  RESPIRATORY:  Clear to auscultation bilaterally.  CARDIOVASCULAR:  S1, S2.  Regular rate and rhythm.  GASTROINTESTINAL:  Bowel sounds are in all four quadrants.  Nontender.  Nondistended.  NEUROLOGIC:  Cranial nerves VII through XII are intact.  No deficits.     LABORATORY DATA/RADIOLOGICAL STUDIES:  None.     EKG:  EKG does show no acute findings though it does show chronic findings of  right atrial hypertrophy, some right axis deviation and S-wave enlargement  showing possible chronic pulmonary disease.     EMERGENCY DEPARTMENT COURSE:  This is a 27 year old female who comes in  looking to feel better during Heroin withdrawal.   The patient is having  symptoms of nausea, shakiness, sweating and runny nose.  While in the  emergency department she was given 1 liter bolus of IV fluid.  She was also  given 25 mg of Phenergan IV.  The patient's vitals were monitored and  remained stable.     DISPOSITION:  At this point, I am going to end the dictation and the  dictation will be  followed up with  an addendum.                                                    _______________________________________  VHD/rc                                 _____  D:  07/24/2008 06:17                  Erie Noe H. Ralph Leyden, M.D.  T:  07/24/2008 14:26  Job #:  [id number]                                        _______________________________________                                         _____  Deno Lunger. Clelia Croft, M.D.                              EMERGENCY DEPARTMENT DISCHARGE NOTE                                                               PAGE    1 of   1    Note: An exclamation mark (!) indicates a result that was not dispersed into   the flowsheet.  Document Creation Date: 07/24/2008 2:41 PM  _______________________________________________________________________    (1) Order result status: Final  Collection or observation date-time: 07/24/2008 00:00  Requested date-time:   Receipt date-time:   Reported date-time:   Referring Physician: Selected Pt  Ordering Physician:  Reviewed In Hospital [facility])  Specimen Source:   Source: DBS  Filler Order Number: [id number]  Lab site:

## 2008-08-03 NOTE — Unmapped (Signed)
THE [facility]     PATIENT NAME:   Anne Goodwin, Anne Goodwin             MR #:  [id number]   DATE OF BIRTH:  Nov 01, 1981                        ACCOUNT #:  [id number]   ED PHYSICIAN:   Deno Lunger. Clelia Croft, M.D.              ROOM #:   PRIMARY:        Referring Nonstaff                NURSING UNIT:  ED   REFERRING:      Selected Referral Pt              FC:  S   DICTATED BY:    Deno Lunger. Clelia Croft, M.D.              ADMIT DATE:  07/24/2008   VISIT DATE:     07/24/2008                        DISCHARGE DATE:                           EMERGENCY DEPARTMENT DISCHARGE NOTE       ADDENDUM, 08/02/2008, gs.     The patient was seen and examined, discussed with resident.  I agree with   plan.                                                       _______________________________________   GJS/mm                                 _____   D:  08/02/2008 17:36                  Deno Lunger. Clelia Croft, M.D.   T:  08/03/2008 04:16   Job #:  [id number]                               EMERGENCY DEPARTMENT DISCHARGE NOTE                                                                PAGE    1 of   1

## 2009-05-17 NOTE — ED Provider Notes (Unsigned)
PATIENT NAME                  PA #             MR #                  MISA, FEDORKO                  [id number]       [id number]            EMERGENCY ROOM PHYSICIAN                 ADM DATE                     Jerl Santos, MD                        05/17/2009                   DATE OF BIRTH    AGE            PATIENT TYPE      RM #               [birth year]       28             ERK                                     CLINICAL TESTING:  This patients C-spine films were basically negative, no  evidence of any hardware dysfunction or new fractures.  She was given a  tetanus booster.  Wounds were cleaned.     FINAL CLINICAL IMPRESSION:   1.  Cervical neck strain.  2.  Lower extremity abrasions.     PLAN:    1.  Routine wound care with [facility].  2.  Lortab number 12.  3.  Follow up with PCP in 1-week.                                Jerl Santos, MD     RG /[id number]  DD: 05/17/2009 18:34  DT: 05/18/2009 13:19  Job #: [id number]  CC:
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
## 2009-06-14 NOTE — ED Provider Notes (Unsigned)
PATIENT NAME                  PA #             MR #                  [name] [name]                  [id number]       [id number]            EMERGENCY ROOM PHYSICIAN                 ADM DATE                     [name] Santos, MD                        06/14/2009                   DATE OF BIRTH    AGE            PATIENT TYPE      RM #               [birth year]       28             ERK                                     DICTATED BY:  Barbarann Ehlers, PA-C     CHIEF COMPLAINT:  Fall down steps.     HISTORY OF PRESENT ILLNESS:  The patient is a pleasant 28 year old female who  comes in with pain in the right shoulder, right elbow and neck.  Apparently,  she fell down some stairs.  She has a history of cervical spinal fusion.  She  denies any muscle weakness or paresthesias.  She did not strike her head or  any loss of consciousness.  She denies any nausea or vomiting.       DRUG ALLERGIES:  LATEX.     CURRENT MEDICATIONS:  Seroquel.  Celexa.  Valium.  Lithium.     PAST MEDICAL AND SURGICAL HISTORY:    1.  C2 fusion at L4.  2.  Bipolar.  3.  Panic attacks, anxiety and paranoia.     SOCIAL HISTORY:  The patient smokes several cigarettes a day.  She drinks  occasionally.       FAMILY HISTORY:  Reviewed and noncontributory.       REVIEW OF SYSTEMS:  General:  The patient denies any history of fever,  chills, fatigue, weakness, or weight loss.  Neurologic:  The patient denies  any headache, dizziness, weakness, confusion, dysphasia, numbness, tingling  or balance problems.   Musculoskeletal:  Pain in the arm.  The patient denies any muscle weakness,  arthralgias, myalgias, or edema.       PHYSICAL EXAMINATION:  GENERAL:  The patient is a 28 year old female who is  alert and oriented x3.  Well-developed, well-nourished, well-hydrated, in no  acute distress.  VITAL SIGNS:  Within normal limits.  NEUROLOGIC:  Alert and  oriented to person, place and time.  Cranial nerves II through XII grossly  intact.  Normal muscular strength, +5  in all proximal and distal extremities.   All deep tendon reflexes 2+ and symmetrical. Negative for  gross sensory  defect.  Romberg test negative.  Toes downward pointing with Babinski.  NECK:   There is diffuse tenderness to the cervical spine.  She has pain with  movement of the neck as well.  RIGHT UPPER EXTREMITY:  She has diffuse  tenderness throughout the entire shoulder.  She has limited range of motion  in the shoulder due to pain.  She has a large amount of radial head  tenderness and pain in the radial head area of the elbow with supination and  pronation of the hand and wrist.  _____ is negative for any tenderness of the  wrist or hand.  Distal extremity is completely neurovascularly intact.       EMERGENCY DEPARTMENT COURSE AND MEDICAL DECISION MAKING:  X-rays of the  C-spine were read as negative.  X-rays of the shoulder were read as negative.   X-rays of the right elbow do show a questionable area of anterior fat pad  sign indicative of occult radial head fracture.  At this point, we will  discharge her in a posterior long-arm splint and sling.  We will give her  prescriptions for Vicodin and Robaxin to take as needed for pain and spasm.   She was instructed to stay in the splint.  Ice and elevation.  We will have  her follow up with Orthopaedics for further evaluation of possible radial  head fracture as well as occult radial head fracture.       Dr. Kristen Cardinal did provide face-to-face time with the patient.                                   [name] Santos, MD     [name] [name] /[id number]  DD: 06/14/2009 16:20  DT: 06/15/2009 08:30  Job #: [id number]  CC:

## 2009-06-17 NOTE — ED Provider Notes (Unsigned)
PATIENT NAME                  PA #             MR #                  [name] [name], [name] [name]                  [id number]       [id number]            EMERGENCY ROOM PHYSICIAN                 ADM DATE                     Jerl Santos, MD                        06/17/2009                   DATE OF BIRTH    AGE            PATIENT TYPE      RM #               February 07, 1981       28             ERK                                     Dictated by Esperanza Richters, Nurse Practitioner     CHIEF COMPLAINT:  Arm injury.     HISTORY OF PRESENT ILLNESS:  This 28 year old Caucasian female presents to  the emergency room stating that she has had increase in swelling in her right  hand, and her ring is stuck on her finger.  She was here on 08/14 with a  possible radial head fracture, and she is due to see the orthopedist  tomorrow.  She states she is out of the Vicodin that was originally  prescribed to her.  She denies any numbness and tingling.  No nausea,  vomiting, fever, diarrhea, or dizziness.  She rates the pain as an 8/10.       ALLERGIES:  LATEX.     OB/GYN:  Her last menstrual cycle was 08/09.     HOME MEDICATIONS:    Robaxin.  Vicodin, which she is out of.  Seroquel.  Valium.  Citalopram.  Lithium.     PAST MEDICAL HISTORY:    1.  Depression.  2.  Anxiety.  3.  Bipolar.  4.  Paranoia.     PSYCHOSOCIAL HISTORY:  A one-quarter pack a day smoker.  Occasional alcohol.   No illicit drug use.     REVIEW OF SYSTEMS:  General:  Patient denies recent history of fever, chills,  fatigue, weakness, or weight loss.  Cardiovascular:  Patient denies chest  pain or tightness, palpitations, or dyspnea on exertion.  Respiratory:   Patient denies shortness of breath, wheezing, cough, or pleuritic pain.  Gastrointestinal:  Patient denies abdominal pain, nausea, vomiting, diarrhea,  constipation, hematochezia, or melena.  Genitourinary:  No suprapubic or CVA  tenderness.  Musculoskeletal:  Complains of right arm pain and swelling.   Skin:  ________       .      PHYSICAL EXAMINATION:  CARDIOVASCULAR:  Regular rate and rhythm.  Normal S1,  S2 sounds.  No murmurs, rubs, or gallops. Peripheral pulses are symmetric and  strong. No lower extremity edema.  RESPIRATORY:  Respirations are visibly  unlabored and regular. No retractions or accessory muscle use is appreciated.   Lungs are clear to auscultation bilaterally, without wheezing, rales, or  rhonchi.  GASTROINTESTINAL:  Soft, non-tender, non-distended with positive  bowel sounds.  No masses, hepatosplenomegaly, or ascites noted. No rebound  tenderness or guarding.  GENITOURINARY:  No suprapubic or CVA tenderness.   MUSCULOSKELETAL:  Patient has full range of motion to her right wrist.   Pulses are positive.  She is currently in a long splint with a sling.  SKIN:   Moderate soft tissue swelling is noted to her hand.  She does have brisk  capillary refill.  There is no erythema, no ecchymosis noted.  Skin is  intact.  VITAL SIGNS:  Temperature 97.8, blood pressure 117/76, heart rate  101, respirations 20, saturations 98%.     EMERGENCY ROOM COURSE:  The patient was evaluated by Dr. Kristen Cardinal and myself.   The Ace wrap had been removed from the patient for the evaluation.  It was  loosely placed back on.  She does have an appointment with the orthopedist  tomorrow at 9 a.m.  We also did remove the ring with a ring cutter, and it  was given back to the patient.  She was instructed to continue applying ice,  elevate, and to keep her appointment tomorrow with the orthopedist.  She was  given a prescription for Vicodin 12 tablets.  She was instructed to return to  the emergency room with increased pain or other concerns.  She will be  discharged home in good condition, without restriction.     IMPRESSION:  Right arm swelling.      The above assessment and plan was discussed with Dr. Kristen Cardinal; he is in full  agreement.                                     Jerl Santos, MD     Edmonia Caprio /[id number]  DD: 06/17/2009 16:23  DT: 06/18/2009 13:23  Job  #: [id number]  CC:

## 2009-06-18 MED ORDER — HYDROCODONE-ACETAMINOPHEN 5-500 MG PO TABS
5-500 MG | ORAL_TABLET | Freq: Four times a day (QID) | ORAL | Status: DC | PRN
Start: 2009-06-18 — End: 2012-06-22

## 2009-06-19 NOTE — Progress Notes (Signed)
Subjective:      Patient ID: [name] [name] is a 28 y.o. Caucasian female.    Chief Complaint   Patient presents with   ??? Arm Injury     right arm pain fell down 9 stairs on saturday. went to the er at [facility]       Elbow Pain  Patient here for follow up of a right elbow pain. The pain has been present for 4 days. Pain described as sharp.  Pain confined to lateral and  also associated with pain radiating to the forearm and swelling.  Mechanism of injury : falling.  Patient denies history of prior problems with this elbow.    Additional Complaints:  Wrist Pain  Patient complaints of right wrist pain. This is evaluated as a personal injury. The pain began 4 days ago. The pain is located primarily in the radial area.  She describes the symptoms as sharp. Symptoms improve with rest, ice. The symptoms are worse with movement. The patient  has neck pain.     Social History   Occupational History   ??? Not on file.   Social History Main Topics   ??? Tobacco Use: Yes -- 0.5 packs/day for 6 years   ??? Alcohol Use: Yes   ??? Drug Use: No   ??? Sexually Active: Not on file        Review of Systems   Constitutional: Negative.    HENT: Positive for neck pain.    Eyes: Negative.    Respiratory: Positive for shortness of breath.    Cardiovascular: Positive for chest pain.   Gastrointestinal: Negative.    Genitourinary: Negative.    Skin: Negative.    Neurological: Positive for dizziness, seizures and headaches.   Endo/Heme/Allergies: Negative.    Psychiatric/Behavioral: Positive for depression.       Objective:   Physical Exam   Constitutional: She appears well-developed and well-nourished. No distress.   HENT:   Head: Normocephalic.   Eyes: Conjunctivae are normal.   Musculoskeletal:        Right shoulder: Normal.        Right elbow: She exhibits decreased range of motion, swelling and effusion. She exhibits no deformity and no laceration. tenderness found. Radial head tenderness noted. No medial epicondyle, no lateral epicondyle and no  olecranon process tenderness noted.        Right wrist: She exhibits decreased range of motion, tenderness and bony tenderness (distal radius). She exhibits no swelling, no effusion and no deformity.     X rays in the ER  Probable radial head fracture, non displaced.  Right wrist- No acute fractures.    Assessment:     1. Wrist Sprain (842.00B)    2. Radial Head Fracture (813.05H)            Plan:     I believe the radial head fracture is present on x ray.    I discussed that the overall alignment of this fracture is good and that the treatment is non-operative.  We discussed the risk of nonunion and or malunion.  We applied a sling today in the office and instructed her  in care.  We will see her  back in 1-2 weeks at which time we will get a new xray of the right elbow and if the wrist is still pain full we will obtain x rays of he wrist..  I will continue to keep you apprised of any further progress.  Thank you.  Vicodin for pain.

## 2009-06-25 NOTE — Progress Notes (Signed)
[name] [name] returns today for follow-up of a right radial head fracture and right wrist sprain..  Date of injury was 06-14-09.  she reports getting a little bit better.Marland Kitchen    Physical Exam:  tender to palpation at the area of the fracture site.          ROM   Right elbow -20 ext and 100* flexion          The skin overlying the elbow is intact without evidence of scar, lesion, laceration or abrasion.          Pulses are intact and symmetric bilaterally     Mild distal radius tenderness with ROM. No snuff box tenderness.              Xrays: see XRAY report dated 06/25/2009    Impression: healing radial head fracture  Resolving wrist sprain.     Plan: Out Patient PT.  Recheck in 4 weeks.

## 2009-11-28 NOTE — ED Notes (Unsigned)
PATIENT NAME:                 PA #:            MR #[name] [name], [name] [name]         [id number]       [id number]            EMERGENCY ROOM PHYSICIAN:                ADM DATE:                    Emergency Department Physician           11/28/2009                   PRIMARY CARE PHYSICIAN:                  REFERRING PHYSICIAN:         SEND NO RESULT NO PCP                                                 DATE OF BIRTH:   AGE:           PATIENT TYPE:     RM #:              10-18-1981       28             ERJ                                     CHIEF COMPLAINT:  Chest pain.       HISTORY OF PRESENT ILLNESS:  The patient is a 29 year old female, otherwise  healthy, coming in today with complaints of 4 to 5 day history of migratory  anterior chest wall pain.  She states it started on the right and moved to  the left and was back to the right and migrating toward her right axilla.   She has not noted any rash.  The pain is worse when she breathes, moves, or  touches the area.  She is not complaining of any fevers or chills.  She does  report a cough, but nothing productive.  She also reports a sore throat that  has been going on for 5 days as well and a recent viral illness.  She also  complains of some mild headache and also a rash on the right buttock.  The  rash is itchy.       PAST MEDICAL HISTORY:  None.       MEDICATIONS:  None.       ALLERGIES:  LATEX CAUSED A RASH WITH ITCHING.       SOCIAL HISTORY:  She is a 1-1/2 pack per day smoking, occasional alcohol.       REVIEW OF SYSTEMS:  As above, otherwise negative for nausea and vomiting.   She has no shortness of breath.  All other systems negative.       PHYSICAL EXAMINATION:    VITAL SIGNS:  Blood pressure 118/79, pulse 87, respirations 18, temperature  98, oxygen saturation 98%.    GENERAL:  The patient is awake, alert, oriented appears in no acute distress.   I had to wake her up.    SKIN:  Skin is warm and dry.  She has a small patch of slightly  raised  erythematous rash on her right buttocks measuring about the size of a nickel,  which is pruritic.  Appears to be a dermatitis.    HEENT:  Head normocephalic, atraumatic.  Eyes normal.  Oropharynx:  There is  slight cobblestone appearance of the posterior pharyngeal wall, but there is  no exudate noted.    NECK:  Supple without any adenopathy.    LUNGS:  Clear and symmetric chest.  There is no wheezing.    HEART:  Regular rate and rhythm.  She is not tachycardic.  Chest wall tender  to palpation over the whole anterior upper chest wall reproducing  identically, her described chest pain.                EMERGENCY DEPARTMENT COURSE:  X-ray of the chest PA and lateral was obtained  and shows no evidence of any abnormalities as read by me initially overnight.   She was given 800 of ibuprofen with no change in her pain.       MEDICAL DECISION-MAKING:   It is my impression the patient has a chest wall  pain, possibly a costochondritis that appears to be migratory.  Negative  chest x-ray.  I did not feel any further tests were necessary given her  normal vital signs.  She does not appear to have infectious process that  warrants antibiotics.  At this time, therefore, I think he can be discharged  home on a course of antiinflammatories.       IMPRESSION/DIAGNOSIS(ES):  Chest wall pain.       PLAN:  Naprosyn b.i.d.  Obtain primary care physician.  Return if anything  worsens.  Topical hydrocortisone cream recommended for her buttocks rash.                                  [name] Bussing Andyn Sales, MD     [id number]  DD: 11/28/2009 03:16  DT: 11/28/2009 06:46  Job #: [id number]  CC:

## 2009-12-15 NOTE — ED Notes (Unsigned)
PATIENT NAME:                 PA #:            MR #[name] [name], [name] [name]         [id number]       [id number]            EMERGENCY ROOM PHYSICIAN:                ADM DATE:                    Emergency Department Physician           12/15/2009                   PRIMARY CARE PHYSICIAN:                  REFERRING PHYSICIAN:         SEND NO RESULT NO PCP                                                 DATE OF BIRTH:   AGE:           PATIENT TYPE:     RM #:              April 30, 1981       29             ERJ                                     CHIEF COMPLAINT:  Abdominal pain.       HISTORY OF PRESENT ILLNESS:  This patient is a 29 year old African American  female who presents to the emergency room complaining of abdominal pain and  vaginal bleeding.  She says it has been going on for the last 2 days.  She  initially thought it was her menstrual cramps, but the pain became more  severe, so that prompted her to take a pregnancy test yesterday which was  positive, and she decided today to come to the emergency department.  She has  not seen an OB/GYN since she found out she was pregnant only yesterday.  She  denies any nausea or vomiting.  She denies any burning with urination, pain  with urination or increased urinary frequency.       PAST MEDICAL HISTORY:  Patient has a history of bipolar disorder.       ALLERGIES:  LATEX.       CURRENT MEDICATIONS:  Seroquel.       SOCIAL HISTORY:  Patient admits to 1/2 pack of cigarettes per day.  Admits to  occasional ETOH.  She does admit to occasional marijuana and cocaine use.   Last use was yesterday.  Denies any IV drug use.       GYNECOLOGIC HISTORY:  Patient is G5, P2-0-2-0 with 1 miscarriage and 1  ectopic pregnancy which was treated with methotrexate.       FAMILY HISTORY:  Unremarkable.       REVIEW OF SYSTEMS:    CONSTITUTIONAL:  Patient denies fevers, chills or weakness.  CARDIOVASCULAR:  Patient denies chest pain or palpitations.    RESPIRATORY:  Patient  denies shortness of breath or cough.       Otherwise review of systems was negative.       PHYSICAL EXAMINATION:    VITAL SIGNS:  Blood pressure 129/75, pulse of 84, respiratory rate 18,  temperature 97.7, O2 saturation 100% on room air.    GENERAL:  A thin African American female not in acute distress.  She appears  comfortable lying in bed.    HEENT:  Head is normocephalic, atraumatic.  Pupils equal, round, reactive to  light and accommodation.  Extraocular muscles are intact.  No nystagmus  noted.  No scleral icterus noted.  Oral mucosa was moist and pink without  erythema or exudate.    NECK:  Supple.  No JVD, no cervical lymphadenopathy noted.    RESPIRATORY:  Clear to auscultation bilaterally with equal chest wall  expansion.    CARDIOVASCULAR:  Regular rate and rhythm.  Normal S1 and S2.  No murmurs,  rubs or gallops noted.    ABDOMEN:  Soft.  She is complaining of tenderness to palpation in the lower  portion of the abdomen with some voluntary guarding present.  There is no  rigidity present.    EXTREMITIES:  No clubbing, cyanosis or edema noted.    NEUROLOGICAL:  Patient has GCS of 15.  Cranial nerves 2 through 12 intact.   No focal neurologic deficits.        EMERGENCY DEPARTMENT COURSE:  Patient placed in a bed, IV access was  obtained.  CBC, type and screen, quantitative hCG were written for.   Urinalysis _____.  Beta hCG was positive.  Pelvic ultrasound was written for.   At this time, care of this patient is being turned over to the oncoming  physician who will complete the patient workup and disposition pending  results of the ultrasound and quantitative hCG.                                  Leota Sauers, MD     [id number]  DD: 12/15/2009 06:49  DT: 12/15/2009 13:59  Job #: [id number]  CC:

## 2009-12-15 NOTE — ED Notes (Unsigned)
PATIENT NAME:                 PA #:            MR #ALLIEN, BEARUP         [id number]       [id number]            EMERGENCY ROOM PHYSICIAN:       ADMIT DATE:     DIS DATE:             Emergency Department Physician  12/15/2009                            PRIMARY CARE PHYSICIAN:         REFERRING PHYSICIAN:                   SEND NO RESULT NO PCP                                                  DATE OF BIRTH:   AGE:           PATIENT TYPE:     RM #:              1980/12/21       28             ERJ                                     This patient was seen primarily by Dr. Cliffton Asters and turned over to me.      HISTORY OF PRESENT ILLNESS:  In brief, she was awaiting her labs and pelvic  ultrasound. The pelvic ultrasound showed an intrauterine pregnancy and a  hemorrhagic cyst on the right ovary consistent with her pain.       PHYSICAL EXAMINATION:  On pelvic exam, she had no bleeding.  She had slight  discharge.  She had pain in the right adnexa on palpation where I could feel  the ovary.  This is a very thin lady, so the exam was very easy.  No cervical  motion tenderness, no pain on the left.       IMPRESSION/DIAGNOSIS(ES):    1.  Intrauterine pregnancy.  2.  Hemorrhagic cyst.       PLAN:  The patient is Rh negative, and I have written for RhoGAM.  She has  required 3 doses of morphine 4 mg for pain control.  I will send her home on  Percocet.  She is going to follow up with [facility] and feel she  can make that appointment herself.  She is going to return here if worse.                                  Berneda Rose, MD     [id number]  DD: 12/15/2009 11:02  DT: 12/15/2009 11:08  Job #: [id number]  CC:

## 2009-12-29 NOTE — ED Provider Notes (Signed)
**THIS DOCUMENT IS UNAUTHENTICATED AND IS NOT THE FINAL LEGAL DOCUMENT OR   MEDICAL OPINION.  PLEASE REFERENCE THE MEDICAL RECORD TAB IN DOCVIEW FOR ALL   EDITED AND SIGNED REPORT UPDATES.**     PATIENT NAME:                 PA #:            MR #[name] [name], PETRIELLO                  [id number]       [id number]            EMERGENCY ROOM PHYSICIAN:                ADM DATE:                    Octavio Graves, MD                     12/29/2009                   DATE OF BIRTH:   AGE:           PATIENT TYPE:     RM #:              January 27, 1981       28             ERK                                     CHIEF COMPLAINT:  Abdominal pain.     HISTORY OF PRESENT ILLNESS:  This is a 29 year old female who comes in  complaining of lower abdominal pain.  It is rated as severe.  It is basically  bilateral.  She was seen at [facility] 2 weeks ago and had an  ultrasound, which showed an intrauterine pregnancy at about 5 weeks.  She  said there was no fetal heart activity at that point.  It also showed she had  an ovarian cyst.  She has an appointment to see [facility], but not  until January 28, 2010, and did not call this past week or last week to make an  appointment earlier.  She was given a prescription for 30 Percocet when she  was seen at [facility] and has gone through all of these over the last 2  weeks.  No reported fevers, no vaginal bleeding.  She has had some nausea and  vomiting.       PAST MEDICAL HISTORY:  Depression, endometriosis, ovarian cyst, chronic neck  pain with C1, C2 fusion.       OB HISTORY:  G5 2-0-2-2 with 2 live births, 1 vaginal, 1 C-section, 1 tubal  pregnancy, which was treated it sounds with methotrexate and 1 spontaneous  abortion.       FAMILY HISTORY:  Noncontributory.     SOCIAL HISTORY:  She smokes cigarettes 1/4 pack a day, drinks socially,  unclear whether or not she is doing that recently.     ALLERGIES:  LATEX.     REVIEW OF SYSTEMS:  All other systems reviewed and  negative except as  mentioned above.     PHYSICAL EXAMINATION:  VITAL SIGNS:  Afebrile.  Pulse  96, respirations 16, blood pressure 105/65.    GENERAL:  This is a thin female, lying in bed, appears uncomfortable but  nontoxic.  ENT:  Mucous membranes are slightly dry.  There is no focal ENT infection.  NECK:  Supple.  CARDIOVASCULAR:  Normal S1, S2.  No murmurs, rubs or gallops.    LUNGS:  Sounds are clear without wheezing.  ABDOMEN:  Soft, mildly tender to deep palpation in the pelvic region  bilaterally with no guarding, rebound or masses, no CVA tenderness.  SKIN:  Intact, no generalized rash.    NEUROLOGIC:  Nonfocal.     MEDICAL DECISION MAKING AND ER COURSE:  The patient just recently had a  pelvic examination and ultrasound and without any reported vaginal discharge  or bleeding, I did not feel that doing a pelvic exam at this point would lend  much to her management.                                     Dalbert Batman, MD     [id number]  DD: 12/29/2009 20:44   DT: 12/30/2009 00:03   Job #: [id number]  CC:

## 2009-12-29 NOTE — ED Provider Notes (Signed)
**THIS DOCUMENT IS UNAUTHENTICATED AND IS NOT THE FINAL LEGAL DOCUMENT OR   MEDICAL OPINION.  PLEASE REFERENCE THE MEDICAL RECORD TAB IN DOCVIEW FOR ALL   EDITED AND SIGNED REPORT UPDATES.**     PATIENT NAME:                 PA #:            MR #[name] [name], [name] [name]                  [id number]       [id number]            EMERGENCY ROOM PHYSICIAN:                ADM DATE:                    Octavio Graves, MD                     12/29/2009                   DATE OF BIRTH:   AGE:           PATIENT TYPE:     RM #:              [birth year]       28             ERK                                     ADDENDUM     PHYSICAL EXAMINATION:  EXTREMITIES:  There is no edema.  NEUROLOGIC:  Exam is nonfocal.     MEDICAL DECISION MAKING/EMERGENCY ROOM COURSE:  Urinalysis is negative for  infection.  Again, she has a nonsurgical abdomen.  I obtained records from  [facility] regarding her visit there 2 weeks ago.  She did have a 5-week  intrauterine pregnancy and was diagnosed with an ovarian cyst, as well.   Apparently patient had a little bit of bleeding at that time and was given  RhoGAM and morphine, and discharged with again 30 Percocet.  At this point  again I believe her abdomen is benign.  She is probably having some ongoing  ovarian cyst pain, but she is hemodynamically stable, not orthostatic.  She  was given 1 L of fluids, 1 mg of IV Dilaudid, and 4 mg of Zofran, and is  comfortable.  I will also discharge her home at this time with a prescription  for Norco, 20 of these.  She can use these sparingly for pain.  She also  should be taking a multivitamin.  She is instructed by discharge instructions  to avoid any smoking or alcohol use, use minimal caffeine and eat regular  meals, and she is to call her doctors at [facility] obstetric[facility]gy[facility]ologic  clinic tomorrow for a followup this week.       IMPRESSION:    1.  Acute abdominal pain.  2.  Intrauterine pregnancy.  3.  Ovarian cyst.     ADDENDUM:  Patient  actually had trace leukocytes in her urine.  Though this  is likely insignificant pending culture, I am going to treat her empirically  with Macrobid for 7 days.  Dalbert Batman, MD     [id number]  DD: 12/29/2009 20:49   DT: 12/30/2009 00:16   Job #: [id number]  CC:

## 2010-01-11 LAB — CBC WITH AUTO DIFFERENTIAL
Basophils %: 0.4 % (ref 0.0–2.0)
Basophils Absolute: 0 10*3 (ref 0.0–0.2)
Eosinophils %: 0.1 % (ref 0.0–5.0)
Eosinophils Absolute: 0 10*3 (ref 0.0–0.6)
Granulocyte Absolute Count: 6.6 10*3 (ref 1.7–7.7)
Hematocrit: 35.8 % — ABNORMAL LOW (ref 36.0–48.0)
Hemoglobin: 12.5 g/dL (ref 12.0–16.0)
Lymphocytes %: 16.7 % — ABNORMAL LOW (ref 25.0–40.0)
Lymphocytes Absolute: 1.4 10*3 (ref 1.0–5.1)
MCH: 31.9 pg (ref 26–34)
MCHC: 34.8 g/dL (ref 31–36)
MCV: 91.5 fl (ref 80–100)
MPV: 8.2 fl (ref 5.0–10.5)
Monocytes %: 6.1 %
Monocytes Absolute: 0.5 10*3
Platelets: 174 10*3 (ref 135–450)
RBC: 3.92 10*6 — ABNORMAL LOW (ref 4.0–5.2)
RDW: 13 % (ref 11.5–14.5)
Segs Relative: 76.7 % — ABNORMAL HIGH (ref 42.0–63.0)
WBC: 8.6 10*3 (ref 4.0–11.0)

## 2010-01-11 LAB — BASIC METABOLIC PANEL
BUN: 11 mg/dL (ref 7–18)
CO2: 22 meq/L (ref 21–32)
Calcium: 9.1 mg/dL (ref 8.3–10.6)
Chloride: 108 meq/L (ref 99–110)
Creatinine: 0.5 mg/dL — ABNORMAL LOW (ref 0.6–1.1)
GFR Est, African/Amer: >60
GFR, Estimated: >60 (ref >60)
Glucose: 144 mg/dL — ABNORMAL HIGH (ref 70–99)
Potassium: 3.8 meq/L (ref 3.5–5.1)
Sodium: 140 meq/L (ref 136–145)

## 2010-01-11 LAB — WET PREP, GENITAL
Trichomonas Prep: NONE SEEN
Yeast, Wet Prep: NONE SEEN

## 2010-01-11 NOTE — ED Provider Notes (Signed)
**THIS DOCUMENT IS UNAUTHENTICATED AND IS NOT THE FINAL LEGAL DOCUMENT OR   MEDICAL OPINION.  PLEASE REFERENCE THE MEDICAL RECORD TAB IN DOCVIEW FOR ALL   EDITED AND SIGNED REPORT UPDATES.**     PATIENT NAME:                 PA #:            MR #[name] [name], NAGAI                  [id number]       [id number]            EMERGENCY ROOM PHYSICIAN:                ADM DATE:                    Windell Moment, MD                   01/11/2010                   DATE OF BIRTH:   AGE:           PATIENT TYPE:     RM #:              05-26-1981       29             ERK                                     CHIEF COMPLAINT:  Abdominal pain.       HISTORY OF PRESENT ILLNESS:   This is a 29 year old female with a positive  pregnancy; she is about [redacted] weeks pregnant.  She comes in with severe abdominal  pain, right lower quadrant, suprapubic and severe vaginal discharge.  Does  report some vomiting as well. She denies any chest pain, shortness of breath.   She has an OB/GYN doctor, Dr. Freida Busman, but does not have an appointment until  next week.  She denies any fevers, chills, headaches.  She rates the pain as  8/10, sharp.  She reports no back pain or other complaints at this time.   Denies dysuria, urgency or frequency.        PRIMARY CARE PHYSICIAN:  Dr. Freida Busman.       PAST MEDICAL HISTORY:  Depression.      MEDICATIONS:  Seroquel.      ALLERGIES:  LATEX.       SOCIAL HISTORY:  She has a history of cigarette smoking, no alcohol or drug  use.      LAST PERIOD:  9 weeks ago.     PHYSICAL EXAMINATION:       VITAL SIGNS:  Blood pressure 92/48, pulse 102, respirations 18, temperature  98.4, and O2 saturation 99%.    HEENT:  Oral mucosa is moist on exam.   NECK:  Supple, trachea is midline.   CARDIAC:  Regular rate and rhythm.   ABDOMEN:  Soft, _____ right lower quadrant.  No rebound or guarding is noted.     GU:  Shows tenderness in the adnexa bilaterally with a thick whitish  discharge.    EXTREMITIES:  Sensation symmetrical.     NEUROLOGIC:  No focal deficits on examination.  MEDICAL DECISION MAKING:  The patient does have wet prep positive for clue  cells and bacteria, consistent with bacterial vaginosis.  Urine is negative.   Pregnancy is obviously positive.  CBC, electrolytes are all within normal  limits.  At this time the patient has symptoms consistent with bacterial  vaginosis.  It does not appear to be PID.  She has no cervical motion  tenderness, fever or white count.  We are going to treat her with IV medicine  for pain and nausea here.  She will be discharged home with some Vicodin for  pain, Flagyl and follow up with her OB/GYN doctor.      FINAL IMPRESSION:  Bacterial vaginosis.                                               Clovia Cuff, MD     [id number]  DD: 01/11/2010 16:22   DT: 01/11/2010 17:27   Job #: [id number]  CC:

## 2010-01-18 LAB — CBC WITH AUTO DIFFERENTIAL
Basophils %: 0.4 % (ref 0.0–2.0)
Basophils Absolute: 0 10*3 (ref 0.0–0.2)
Eosinophils %: 0 % (ref 0.0–5.0)
Eosinophils Absolute: 0 10*3 (ref 0.0–0.6)
Granulocyte Absolute Count: 6.5 10*3 (ref 1.7–7.7)
Hematocrit: 37.9 % (ref 36.0–48.0)
Hemoglobin: 13 g/dL (ref 12.0–16.0)
Lymphocytes %: 22.8 % — ABNORMAL LOW (ref 25.0–40.0)
Lymphocytes Absolute: 2.1 10*3 (ref 1.0–5.1)
MCH: 31.4 pg (ref 26–34)
MCHC: 34.3 g/dL (ref 31–36)
MCV: 91.7 fl (ref 80–100)
MPV: 9 fl (ref 5.0–10.5)
Monocytes %: 5.5 % (ref 0.0–12.0)
Monocytes Absolute: 0.5 10*3 (ref 0.0–1.3)
Platelets: 206 10*3 (ref 135–450)
RBC: 4.13 10*6 (ref 4.0–5.2)
RDW: 13.5 % (ref 11.5–14.5)
Segs Relative: 71.3 % — ABNORMAL HIGH (ref 42.0–63.0)
WBC: 9.1 10*3 (ref 4.0–11.0)

## 2010-01-18 LAB — COMPREHENSIVE METABOLIC PANEL
ALT: 16 U/L (ref 10–40)
AST: 20 U/L (ref 15–37)
Albumin/Globulin Ratio: 2 (ref 1.1–2.2)
Albumin: 4.7 g/dL (ref 3.4–5.0)
Alkaline Phosphatase: 58 U/L (ref 45–129)
BUN: 15 mg/dL (ref 7–18)
CO2: 24 meq/L (ref 21–32)
Calcium: 9.8 mg/dL (ref 8.3–10.6)
Chloride: 105 meq/L (ref 99–110)
Creatinine: 0.5 mg/dL — ABNORMAL LOW (ref 0.6–1.1)
GFR Est, African/Amer: >60
GFR, Estimated: >60 (ref >60)
Glucose: 91 mg/dL (ref 70–99)
Potassium: 3.5 meq/L (ref 3.5–5.1)
Sodium: 139 meq/L (ref 136–145)
Total Bilirubin: 0.3 mg/dL (ref 0.0–1.0)
Total Protein: 7.1 g/dL (ref 6.4–8.2)

## 2010-01-18 LAB — LIPASE: Lipase: 45 U/L (ref 5.6–51.3)

## 2010-01-18 LAB — AMYLASE: Amylase: 59 U/L (ref 25–115)

## 2010-01-18 NOTE — ED Provider Notes (Signed)
**THIS DOCUMENT IS UNAUTHENTICATED AND IS NOT THE FINAL LEGAL DOCUMENT OR   MEDICAL OPINION.  PLEASE REFERENCE THE MEDICAL RECORD TAB IN DOCVIEW FOR ALL   EDITED AND SIGNED REPORT UPDATES.**     PATIENT NAME:                 PA #:            MR #[name] [name], [name] [name]                  [id number]       [id number]            EMERGENCY ROOM PHYSICIAN:                ADM DATE:                    Sloka Volante C. Elon Alas, MD                     01/17/2010                   DATE OF BIRTH:   AGE:           PATIENT TYPE:     RM #:              [birth year]       29             ERK                                     REASON FOR VISIT:  Multiple complaints.     HISTORY OF PRESENT ILLNESS:  The patient is a 29 year old patient who  initially came in complaining that she started Flagyl today and now she has  tongue swelling and difficulty breathing.  She states she had recently been  diagnosed with bacterial vaginosis.  She is pregnant.  She also states that  she was having some right lower quadrant pain, which she has been having for  over 2 months and was recently diagnosed with a right ovarian cyst, but the  pain has been there constant for almost 2 months now.  No fevers or chills.   No nausea or vomiting.  No _____.  No back pain.  Denies vaginal bleeding or  discharge.  No discomfort with urination.      PAST MEDICAL HISTORY:  Right ovarian cyst on ultrasound here about 10 days  ago.     MEDICATIONS:  Flagyl.     ALLERGIES:  LATEX.     SOCIAL HISTORY:  The patient does not smoke, does drink.     FAMILY HISTORY:  Noncontributory.     REVIEW OF SYSTEMS:  As above.   CONSTITUTIONAL:  No fevers or chills.   HEENT:  No headache, ears, nose or throat pain.  Denies visual complaints.   NECK:  No pain.  CHEST:  No chest pain or palpitations.  LUNGS:  No cough, congestion or shortness of breath.   ABDOMEN:  Complains of abdominal pain on the right side.  No nausea or  vomiting, no diarrhea.  No incontinence of bowel or bladder.     EXTREMITIES:  No myalgias.  No DVT.      PHYSICAL EXAMINATION:  VITAL SIGNS:  Temp of 97.7,  pulse 89, respirations 20, blood pressure 136/73,  O2 sat is _____% on room air.   GENERAL:  She is awake, alert, well-developed, well-nourished, in no  distress.  HEENT:  Normocephalic, atraumatic.  Pupils equal, round, and reactive.  Ears,  nose, and throat were clear.  Tongue questionably enlarged, not very obvious.     NECK:  Supple, nontender.   HEART:  Regular rate and rhythm.   LUNGS:  Clear.  ABDOMEN:  Soft, nondistended with positive bowel sounds.  Some discomfort  palpated in the right suprapubic region.    BACK:  Normal to inspection.  EXTREMITIES:  No clubbing, no cyanosis.   Rest of the exam was unremarkable.      LABORATORIES:  CBC and CMP were unremarkable.  Urinalysis positive for  pregnancy, otherwise negative.       ASSESSMENT AND PLAN:  This is a 29 year old with an allergic reaction  complaining of chronic abdominal pain for almost 2 months and was diagnosed  with a cyst on her right ovary.  She had an ultrasound done a week ago that  she a 9 week intrauterine pregnancy.  She has nothing that I would obviously  pick up as tongue swelling, but she states it feels enlarged to her, so I  gave Benadryl and Solu-Medrol.  I did give her morphine for the pain.  I am  going to continue her on Benadryl and Solu-Medrol and have her follow up with  her primary care physician.  I will switch her to Keflex.                                              Burr Medico, MD     [id number]  DD: 01/18/2010 05:25   DT: 01/18/2010 11:28   Job #: [id number]  CC:

## 2010-01-19 NOTE — ED Provider Notes (Signed)
**THIS DOCUMENT IS UNAUTHENTICATED AND IS NOT THE FINAL LEGAL DOCUMENT OR   MEDICAL OPINION.  PLEASE REFERENCE THE MEDICAL RECORD TAB IN DOCVIEW FOR ALL   EDITED AND SIGNED REPORT UPDATES.**     PATIENT NAME:                 PA #:            MR #:                 [name] [name]                  [id number]       [id number]            EMERGENCY ROOM PHYSICIAN:                ADM DATE:                    Muzamil Harker C. Ulus Hazen, MD                     01/19/2010                   DATE OF BIRTH:   AGE:           PATIENT TYPE:     RM #:              05/06/1981       29             ERK                                        REASON FOR VISIT:  Allergic reaction.     HISTORY OF PRESENT ILLNESS:  The patient is a 29-year-old who took a dose of  KEFLEX and felt like her tongue was swelling and she got short of breath.  No  fevers or chills.  No nausea or vomiting.  No headaches or neck pain.  No  chest pain.  No abdominal pain.  She is pregnant.     PAST MEDICAL HISTORY:  Bipolar.     MEDICATIONS:  1.  KEFLEX.  2.  Tylenol.  3.  Seroquel.     ALLERGIES:  FLAGYL, LASIX, and KEFLEX.     SOCIAL HISTORY:  The patient does smoke.     FAMILY HISTORY:  Noncontributory.     REVIEW OF SYSTEMS:  As above.  CONSTITUTIONAL:  No fever or chills.  HEENT:  No headache or ears, nose, or throat pain.  NECK:  Denies neck pain.  CHEST:  Denies chest pain or palpitations.  LUNGS:  No cough, congestion, or shortness of breath.  ABDOMEN:  No anorexia, nausea, or vomiting.     PHYSICAL EXAMINATION:    VITAL SIGNS:  Temperature 97, pulse 90, respirations 18, blood pressure  107/60, O2 saturation 100% on room air.  GENERAL:  She is awake and alert.  She is in no obvious distress.  HEENT:  Normocephalic, atraumatic.  Pupils equal, round, and reactive.  Ears,  nose, and throat were clear.  She does have some tongue swelling noted.    NECK:  Supple, nontender.  HEART:  Regular.  LUNGS:  Clear.  ABDOMEN:  Soft.  SKIN:  No rashes.  The rest of the exam is  unremarkable.     ASSESSMENT/PLAN:  A 29-year-old   with angioedema.  I gave her Solu-Medrol,  Pepcid, and Benadryl.  Her tongue swelling has improved quite a bit.  I am  going to stop the KEFLEX and keep her on a Medrol Dosepak, Pepcid, and  Benadryl.                                            Trayson Stitely, MD     [id number]  DD: 01/19/2010 03:28   DT: 01/19/2010 06:41   Job #: [id number]  CC:

## 2010-01-19 NOTE — ED Provider Notes (Signed)
**THIS DOCUMENT IS UNAUTHENTICATED AND IS NOT THE FINAL LEGAL DOCUMENT OR   MEDICAL OPINION.  PLEASE REFERENCE THE MEDICAL RECORD TAB IN DOCVIEW FOR ALL   EDITED AND SIGNED REPORT UPDATES.**     PATIENT NAME:                 PA #:            MR #[name] [name], [name] [name]                  [id number]       [id number]            EMERGENCY ROOM PHYSICIAN:                ADM DATE:                    Celester Lech C. Elon Alas, MD                     01/19/2010                   DATE OF BIRTH:   AGE:           PATIENT TYPE:     RM #:              [birth year]       29             ERK                                        REASON FOR VISIT:  Allergic reaction.     HISTORY OF PRESENT ILLNESS:  The patient is a 29 year old who took a dose of  KEFLEX and felt like her tongue was swelling and she got short of breath.  No  fevers or chills.  No nausea or vomiting.  No headaches or neck pain.  No  chest pain.  No abdominal pain.  She is pregnant.     PAST MEDICAL HISTORY:  Bipolar.     MEDICATIONS:  1.  KEFLEX.  2.  Tylenol.  3.  Seroquel.     ALLERGIES:  FLAGYL, LASIX, and KEFLEX.     SOCIAL HISTORY:  The patient does smoke.     FAMILY HISTORY:  Noncontributory.     REVIEW OF SYSTEMS:  As above.  CONSTITUTIONAL:  No fever or chills.  HEENT:  No headache or ears, nose, or throat pain.  NECK:  Denies neck pain.  CHEST:  Denies chest pain or palpitations.  LUNGS:  No cough, congestion, or shortness of breath.  ABDOMEN:  No anorexia, nausea, or vomiting.     PHYSICAL EXAMINATION:    VITAL SIGNS:  Temperature 97, pulse 90, respirations 18, blood pressure  107/60, O2 saturation 100% on room air.  GENERAL:  She is awake and alert.  She is in no obvious distress.  HEENT:  Normocephalic, atraumatic.  Pupils equal, round, and reactive.  Ears,  nose, and throat were clear.  She does have some tongue swelling noted.    NECK:  Supple, nontender.  HEART:  Regular.  LUNGS:  Clear.  ABDOMEN:  Soft.  SKIN:  No rashes.  The rest of the exam is  unremarkable.     ASSESSMENT/PLAN:  A 29 year old  with angioedema.  I gave her Solu-Medrol,  Pepcid, and Benadryl.  Her tongue swelling has improved quite a bit.  I am  going to stop the [facility] and keep her on a Medrol Dosepak, Pepcid, and  Benadryl.                                            Burr Medico, MD     [id number]  DD: 01/19/2010 03:28   DT: 01/19/2010 06:41   Job #: [id number]  CC:

## 2010-02-01 NOTE — ED Provider Notes (Signed)
**THIS DOCUMENT IS UNAUTHENTICATED AND IS NOT THE FINAL LEGAL DOCUMENT OR   MEDICAL OPINION.  PLEASE REFERENCE THE MEDICAL RECORD TAB IN DOCVIEW FOR ALL   EDITED AND SIGNED REPORT UPDATES.**     PATIENT NAME:                 PA #:            MR #[name] [name], [name] [name]         [id number]       [id number]            EMERGENCY ROOM PHYSICIAN:                ADM DATE:                    Thayer Headings, MD                01/21/2010                   DATE OF BIRTH:   AGE:           PATIENT TYPE:     RM #:              1981/10/25       29             ERK                                     DICTATED BY:  Parke Poisson, NP dictating for Romelle Starcher, MD     CHIEF COMPLAINT:  Allergic reaction.     HISTORY OF PRESENT ILLNESS:  This is a 29 year old female who comes to the  emergency room, states that she had an allergic reaction on the 21st.  She  was seen here in the emergency room.  She was given a Medrol Dosepak and a  prescription for Pepcid.  She states that she did not get those prescriptions  filled.  She has not been taking the medicine but she feels like her tongue  is still swollen.  She rates her abdominal pain as 7/10.     ALLERGIES:  KEFLEX, FLAGYL and LATEX.     She is G5, P2.     HOME MEDICATIONS:  1.  Seroquel.  2.  Medrol Dosepak.  3.  Prilosec.  However, she has not filled those prescriptions.     PAST MEDICAL HISTORY:  Psych, ovarian cyst, bipolar and pregnancy.     PSYCHOSOCIAL HISTORY:  She smokes 3 cigarettes a day.  No alcohol, no illicit  drug use.  She is approximately 11-weeks pregnant.     REVIEW OF SYSTEMS:  GENERAL:  Patient denies recent history of fever, chills, fatigue, weakness,  or weight loss.  OROPHARYNX:  She states like she feels like her tongue is swollen.     All other systems are negative.     PHYSICAL EXAMINATION:  VITAL SIGNS:  97.8, blood pressure 111/72, heart rate 101, respirations 18.  GENERAL:  Well developed, well nourished. Alert and oriented x 3, in  no  apparent distress.   EYES:  Pupils are equal, round, and reactive. Extraocular movements intact.   Sclera anicteric. No conjunctival erythema or discharge.   OROPHARYNX:  Oral mucosa is moist.  There  are no lesions on the tongue, soft  palate, or buccal mucosa.  Posterior pharynx is benign.  Tonsils are not  enlarged.  There is no erythema or exudates.  Uvula is not deviated.  CARDIOVASCULAR:  Regular rate and rhythm.  Normal S1, S2 sounds.  No murmurs,  rubs, or gallops. Peripheral pulses are symmetric and strong. No lower  extremity edema.   RESPIRATORY:  Respirations are visibly unlabored and regular. No retractions  or accessory muscle use is appreciated.  Lungs are clear to auscultation  bilaterally, without wheezing, rales, or rhonchi.   SKIN:  Warm and dry with good color.  There is no rash.  No significant  abrasions or lacerations.     EMERGENCY ROOM COURSE:  The patient was evaluated by Dr. Ezequiel Kayser and myself.   We did give the patient one Pepcid.  She has been instructed to get her  medications filled, to take them appropriately, that she can also take  Benadryl q.6 x4 doses.  She was instructed on the importance of taking her  medications as prescribed, given the fact that we are trying to stop the  allergic reaction.  The patient was unsure of what she was exposed to, to  begin with.  I instructed the patient to try and figure out originally what  she could have been exposed to so she does not have a second exposure.  She  will be instructed to follow up with her primary care physician in 1 to 2  days if no improvement, return for increased swelling.  She will be  discharged home in good condition without restriction.     IMPRESSION:  Allergic reaction.     The above assessment and plan was discussed with Dr. Ezequiel Kayser and he is in  agreement.                                            Thayer Headings, MD     [id number]  DD: 02/01/2010 20:21   DT: 02/02/2010 04:19   Job #: [id number]  CC:

## 2010-03-09 NOTE — Unmapped (Signed)
[facility]     PATIENT NAME:   Anne Goodwin, Anne Goodwin             MR #:  [id number]   DATE OF BIRTH:  09/26/1981                        ACCOUNT #:  [id number]   ED PHYSICIAN:   Loralie Champagne, M.D.          ROOM #:   PRIMARY:        No Pcp No Pcp                     NURSING UNIT:  ED   REFERRING:      Selected Referral Pt              FC:  D   DICTATED BY:    Melissa Montane, M.D.        ADMIT DATE:  02/25/2010   VISIT DATE:                                       DISCHARGE DATE:                               EMERGENCY DEPARTMENT NOTE     *-*-*      CHIEF COMPLAINT:  Right lower and left lower quadrant pain in the context of    pregnancy.      HISTORY OF PRESENT ILLNESS:  The patient is a 29 year old female who is now    G5 P2 (she has two living children with one spontaneous abortion in 2005 and    one ectopic pregnancy in 2008) who presents [redacted] weeks pregnant with right    lower to left lower quadrant pain that has been worse over the past few   days.    In the past two days, she has vomited three times total.  Other than this,    she is tolerating pretty good oral intake.  She really has slight decrease   in    her urine output.  She is having no diarrhea.  She is having no fevers.  She    is sexually active with the partner with which she is pregnant only.  She   had    no new partners recently.  She does have history of sexually transmitted    infections in the past.  Her last one that she knows of was in 2001.  She    believes it was gonorrhea.  She has been tested since, but there has been    nothing positive.  She has never had pelvic inflammatory disease that she    knows of.  She has had no vaginal bleeding.  She does say that her vaginal    discharge has been more elevated than it has been thicker.      REVIEW OF SYSTEMS:  A 12-point review of systems is pertinent for the points    discussed previously and is otherwise unremarkable.      ALLERGIES:      1.  Keflex.    2. Latex.    3. Flagyl.      MEDICATIONS:      1. Multivitamin.      PAST MEDICAL AND SURGICAL HISTORY:  1. Bipolar disorder.    2. Ectopic pregnancy.    3. Gestational diabetes with a fingerstick blood sugar of 72.      FAMILY HISTORY:  Noncontributory.      SOCIAL HISTORY:  The patient smokes two cigarettes a day.  She denies   alcohol    or other drugs.      PHYSICAL EXAMINATION:      VITAL SIGNS:  Temperature 97.7 degrees Fahrenheit, pulse 77, respiratory   rate    18, blood pressure 105/65, oxygen saturation 100% on room air.    GENERAL:  The patient is a well-appearing, alert, oriented, 29 year old   woman    in no acute distress, but she does appear mildly uncomfortable lying on the    bed.    HEENT:  Conjunctivae and sclerae are clear without injection.  Oropharynx is    clear without erythema, lesion or exudate.    HEART:  Regular rate and rhythm without murmur.    LUNGS:  Clear to auscultation bilaterally without wheezes, rales or rhonchi.    ABDOMEN:  Soft, nondistended.  She has both left lower and right lower    quadrant tenderness to palpation.  At this time, her right lower quadrant    tenderness is slightly worse than her left.  She has no rebound or guarding    present.  She has no peritoneal signs present.    GENITOURINARY:  A pelvic exam was performed and she does have a moderate    amount of white to yellow purulent discharge present in her vagina and   coming    from her cervical os.  She also has a few strawberry spots on her cervix.  A    bimanual exam reveals no cervical motion or adnexal tenderness.  Repeat exam    of her abdomen reveals that she really is equally tender on her left and    right lower quadrants.    EXTREMITIES:  Warm and well-perfused with a brisk capillary refill.  She has    good peripheral perfusion.  There is no extremity edema noted.      EMERGENCY DEPARTMENT COURSE/MEDICAL DECISION MAKING:  The patient was   brought    to A pod where an IV was  established and she was given normal saline,    Phenergan and Tylenol for her pain.  Tylenol did help her pain, but after   her    pelvic exam, she is having more pain, so she was given a dose of oxycodone    with some good response to decrease her pain.  I believe that her symptoms    and her pelvic exam are consistent with a sexually transmitted infection or    cervicitis at this time.  There is no concern for pelvic inflammatory    disease.  Urine pregnancy was indeed positive.  A urinalysis showed large    leukocyte esterase, no nitrite, occasional bacteria, 6 white cells, 1 red    cell and 3 squamous cells.  This urinalysis on the context of someone who is    not having urinary symptoms certainly can be consistent with a sexually    transmitted infection, which I believe that she has.  The patient was given   a    one time dose of ceftriaxone IM and azithromycin by mouth.  I was noted that    she had an allergy to Keflex.  She did complain of mild tongue swelling    without  any lip swelling, difficulty breathing, hives or other problems    approximately 15-20 minutes after she received the ceftriaxone.  Due to the    nursing demands in the emergency department at this time, the patient was    unable to receive any Benadryl or prednisone for approximately one hour   after    she developed these symptoms.  During this time, the symptoms also began to    resolve on their own and she was then given prednisone and Benadryl.  By the    time that she left approximately 20 minutes after she received these    medications, she was only having minimal tongue swelling and she continued   to    have no respiratory difficulties, no hives, no lip or other swelling, had    only subjective tongue swelling.  On my exam, I could not really appreciate    her tongue being swollen.  I am reassured that the patient has been seen by    her OB already and that they saw intrauterine pregnancy.  Given that she is    not having any  vaginal bleeding at this time, we did not feel that further    evaluation with ultrasound or other studies to evaluate her pregnancy was    warranted at this time.  I believe that all her pain could be explained by    her likely sexually transmitted infection.  A wet prep showed no Trichomonas    and no yeast.  GC and chlamydia are pending.  The patient is being followed    by the [facility].  I asked that she call them today or    tomorrow to make an appointment for followup with them and that she should    certainly return to the emergency department sooner if she is having    worsening or new concerns.  She verbalized understanding of this and agrees    with that plan.      IMPRESSION:      1. Cervicitis.      CONDITION:  Stable.      DISPOSITION:  Home.      PLAN:      1. The patient is to follow up with her OB/GYN in the near future.  I asked      that she call her OB to make an appointment.    2. Certainly, she is having worsening or new concerns, she should not      hesitate to return to the emergency department for further evaluation.      This patient was discussed and evaluated by the attending physician, Dr.    Loralie Champagne, who is in agreement with the medical decision making and    the plan.     *-*-*                   *** THIS IS AN ELECTRONICALLY VERIFIED REPORT ***   Melissa Montane, M.D. at 03/09/2010 8:41 AM                                           _______________________________________   DJS/cm                                 _____   D:  02/25/2010 17:46                  Melissa Montane, M.D.   T:  02/26/2010 04:37   Job #:  [id number]                                         _______________________________________                                          _____                                         Loralie Champagne, M.D.                                  EMERGENCY DEPARTMENT NOTE                                                                PAGE    1 of   1

## 2010-04-07 LAB — POC URINALYSIS
Bilirubin, Urine: NEGATIVE mg/dL
Blood, Urine: NEGATIVE
Glucose, UA: NEGATIVE mg/dL
Ketones, Urine: NEGATIVE mg/dL
Leukocyte Esterase, Urine: NEGATIVE
Nitrite, Urine: NEGATIVE
Protein, UA: NEGATIVE mg/dL
Specific Gravity, UA: 1.025 (ref 1.005–1.035)
Urobilinogen, Urine: 0.2 EU/dL (ref 0.2–1.0)
pH, UA: 5.5 (ref 5.0–8.0)

## 2010-04-07 NOTE — ED Notes (Unsigned)
PATIENT NAME:                 PA #:            MR #[name] [name], [name] [name]         [id number]       [id number]            EMERGENCY ROOM PHYSICIAN:                ADM DATE:                    Emergency Department Physician           04/07/2010                   PRIMARY CARE PHYSICIAN:                  REFERRING PHYSICIAN:         Reginia Naas, MD                                                DATE OF BIRTH:   AGE:           PATIENT TYPE:     RM #:              [birth year]       29             ERJ                                     CHIEF COMPLAINT:  Back pain.      HISTORY OF PRESENT ILLNESS:  The patient is a 29 year old, G3, P1-0-0-1, at  approximately [redacted] weeks gestational age, who had an ultrasound at [redacted] weeks  gestational age that confirmed intrauterine pregnancy and thus far has had no  complications with her pregnancy.  She said she was moving over the last  weekend, moving multiple boxes, and thinks she may have pulled a muscle in  her back.  She said that she has been having increasing amount of pains to  her left lower back region that radiate down the posterior aspect of her  thigh and occasionally to the anterior part.  There are times when she is  sitting that it feels like the left buttock region is tingling and she is  having more and more significant pain.  She says she has been trying to take  ibuprofen, but it has not eased her symptoms.  Otherwise, she has no other  complaints.  No other injury or trauma to report.  She denies any vaginal  gush of fluid or vaginal bleeding.  She does report she still feels the baby  move.  She said she has noticed a little bit more vaginal discharge lately  than normal.  No odor.  She complained of a little bit of dysuria.  No  hematuria.  No fevers or chills, nausea, vomiting, diarrhea or abdominal  pain, runny nose, cough or congestion, chest pain, difficulty breathing.  She  has no swelling in one leg or the other.  She has no other  complaints or  questions or concerns to report at this time.      PAST MEDICAL HISTORY:  Bipolar disorder, gestational diabetes. The remainder  as stated in the history of present illness.      ALLERGIES:  KEFLEX, FLAGYL and LATEX.     MEDICATIONS:    1.  Prenatal vitamins.  2.  Ibuprofen.   3.  Folic acid.      SOCIAL HISTORY:  She smokes 2 cigarettes a day.  No alcohol.  No drug use.      FAMILY HISTORY:  Noncontributory.      REVIEW OF SYSTEMS:  As stated in the history of present illness.  All systems  are reviewed and are negative.      PHYSICAL EXAMINATION:    VITAL SIGNS:  On ED admission, blood pressure 113/64, pulse 83, respiratory  rate  20, temperature 98.3, oxygen 100% on room air.   GENERAL:  The patient is a well-developed, well-nourished-appearing female  who is seated comfortably on the stretcher.  She is awake, alert and oriented  to the circumstances surrounding her ED presentation today.   HEENT:  Pupils are equally round and reactive to accommodation.  Extraocular  muscles are intact.  Oropharynx is clear and moist without petechia,  induration or exudate.   NECK:  Soft and supple without any lymphadenopathy.  She has pain-free full  range of motion.  RESPIRATORY:  Lungs are clear to auscultation bilaterally with equal air  entry.  Respiratory effort is unlabored.   CARDIOVASCULAR:  Regular rate and rhythm without murmurs, rubs or gallops.   She has 2+ pulses in all 4 extremities.  ABDOMEN:  Gravid, soft, nontender, nondistended with normoactive bowel  sounds.   SKIN:  There is no cyanosis, clubbing or edema, petechia,  or purpura in the  lower extremities.   NEUROLOGIC:  Cranial nerves 2-12 are grossly intact.  Motor is intact in all  4 extremities.  She had 2+ out of 4 deep tendon reflexes in the Achilles.   Motor is 5/5 in the upper and lower extremities proximally and distally  bilaterally.  Sensation is  intact in upper and lower extremities proximally  and distally bilaterally.  The patient  is able to ambulate without antalgia  or ataxia.    MUSCULOSKELETAL:  She has no CVA tenderness to percussion.  She has no  cervical, thoracic or lumbosacral tenderness to palpation of any spinous  process or step-offs.  She has 2+ dorsalis pedis and posterior tibial pulse  to her lower extremities bilaterally.  GENITOURINARY:  Normal external female genitalia without external lesions or  inguinal lymphadenopathy.  Speculum examination reveals a non-friable cervix  with minimal what appears to be normal physiologic discharge.  She had no  cervical motion tenderness, adnexal tenderness or fullness on examination.       Urinalysis and wet prep are pending.  Gonorrhea and chlamydia cultures have  also been sent.       MEDICAL DECISION-MAKING:  The patients physical exam does _____ with what  appears to be low back pain, likely musculoskeletal in nature.  Once the  patients urinalysis and trichomonas have been returned, if there is anything  positive, will need to be treated; otherwise, if they are negative, she will  be safely discharged home.  She already has a followup appointment with her  obstetrician within the next week.  She has already been advised of range of  motion and strengthening exercises for her back, and otherwise symptomatic  care.  It was advised to the patient that we can prescribe her Percocet, but  any narcotic pain medication will go directly to the baby, that it will  affect the baby as well, that it should only be used if absolutely necessary,  and she stated understanding regarding this.       IMPRESSION/DIAGNOSIS(ES):  Low back pain, likely musculoskeletal in nature.      PLAN:  The patient will be discharged home in stable condition.  She will be  advised to follow up with her gynecologist.  There will be an addendum to  this dictation only if her urinalysis or her trichomonas swab should become  positive.  She is advised that the cultures for gonorrhea and chlamydia will  be outstanding and  pending, and she will need to follow up on these results  with her obstetrician.  She stated understanding and agreed to comply with  the plan as outlined above.       DISCHARGE MEDICATIONS:  At this point in time will include 15 tablets of  Percocet.                                             Tally Joe, MD     /[id number]  DD: 04/07/2010 02:43  DT: 04/09/2010 18:19  Job #: [id number]  CC:

## 2010-04-08 LAB — C.TRACHOMATIS N.GONORRHOEAE DNA
C. Trachomatis Amplified: NEGATIVE
N. Gonorrhoeae Amplified: NEGATIVE

## 2010-05-13 ENCOUNTER — Inpatient Hospital Stay: Disposition: A | Discharge status: Home / Self Care

## 2010-05-13 LAB — POCT GLUCOSE MONITORING DEVICE: POC Glucose: 142 mg/dL — ABNORMAL HIGH (ref 65–99)

## 2010-05-13 LAB — POC URINALYSIS
Bilirubin, Urine: NEGATIVE mg/dL
Blood, Urine: NEGATIVE
Glucose, UA: NEGATIVE mg/dL
Ketones, Urine: NEGATIVE mg/dL
Leukocyte Esterase, Urine: NEGATIVE
Nitrite, Urine: NEGATIVE
Protein, UA: NEGATIVE mg/dL
Specific Gravity, UA: 1.02 (ref 1.005–1.035)
Urobilinogen, Urine: 0.2 EU/dL (ref 0.2–1.0)
pH, UA: 7 (ref 5.0–8.0)

## 2010-05-13 MED ORDER — HYDROCODONE-ACETAMINOPHEN 5-500 MG PO TABS
5-500 MG | ORAL_TABLET | Freq: Four times a day (QID) | ORAL | Status: AC | PRN
Start: 2010-05-13 — End: 2010-05-20

## 2010-05-13 MED ORDER — HYDROCODONE-ACETAMINOPHEN 5-500 MG PO TABS
5-500 MG | Freq: Once | ORAL | Status: AC
Start: 2010-05-13 — End: 2010-05-13
  Administered 2010-05-13: 18:00:00 1 via ORAL

## 2010-05-13 MED FILL — HYDROCODONE-ACETAMINOPHEN 5-500 MG PO TABS: 5-500 MG | ORAL | Qty: 1

## 2010-05-13 NOTE — ED Notes (Signed)
To ED 22.  Dr. Glynis Smiles at bedside with patient.     Zollie Beckers, RN  05/13/10 1400

## 2010-05-13 NOTE — ED Provider Notes (Signed)
HPI  Chief Complaint:    Chief Complaint   Patient presents with   ??? Back Pain     Lower back pain       Nursing Notes, Past Medical Hx, Past Surgical Hx, Social Hx, Allergies, and Family Hx were all reviewed and agreed with or any disagreements were addressed in the HPI.    HPI:    [name] [name] is a 29 y.o. female that is [redacted] weeks pregnant who presents to the emergency department with a complaint of  Lower back pain that radiates to the left buttock and down the left posterior leg times several days. The patient states that she has been pregnant previously however she has not had back pain similar to this in the past. She states that it is made worse with sitting directly onto her buttocks. She describes it as a burning, shooting pain that is relieved by keeping pressure off of the left side of the buttock. She denies any vaginal bleeding, abdominal pain or cramps, she denies any chest pain or shortness of breath. She states that the pain she is having in her back does not feel like abdominal cramps due to labor. She does have an OB at [facility] who she has followup with on the 26th. She states that she has been seen in the past for this complaint of back pain, was admitted overnight to the hospital with continuous fetal monitoring, and that the physician determined that her pain and symptoms is not due to an OB etiology.      Review of Systems   Constitutional: Negative for fever and chills.   Respiratory: Negative for cough, chest tightness and shortness of breath.    Cardiovascular: Negative for chest pain.   Gastrointestinal: Negative for nausea, vomiting, abdominal pain, diarrhea and constipation.   Genitourinary: Negative for dysuria, frequency, hematuria, flank pain, vaginal bleeding, vaginal discharge, difficulty urinating, vaginal pain and pelvic pain.   Musculoskeletal: Positive for back pain.   [all other systems reviewed and are negative        Physical Exam   [nursing  notereviewed.  Constitutional: She is oriented to person, place, and time. She appears well-developed and well-nourished. No distress.   HENT:   Head: Normocephalic and atraumatic.   Right Ear: External ear normal.   Left Ear: External ear normal.   Nose: Nose normal.   Eyes: Right eye exhibits no discharge. Left eye exhibits no discharge.   Neck: Normal range of motion. Neck supple.   Cardiovascular: Regular rhythm.    Pulmonary/Chest: Effort normal and breath sounds normal.   Abdominal: Bowel sounds are normal. No tenderness.        Fetal heart tones are 150 he eats per minute.   Genitourinary: Vagina normal and uterus normal.        The cervix is normal appearance. The cervical os is closed. No blood in the vaginal vault.   Musculoskeletal: Normal range of motion. She exhibits tenderness. She exhibits no edema.        Lumbar back: She exhibits normal range of motion, no tenderness, no bony tenderness, no deformity and no pain.        The patient has no midline tenderness of the lumbar spine. She has paraspinal muscle tenderness in the left lumbar region.   Neurological: She is alert and oriented to person, place, and time.   Skin: Skin is warm and dry.   Psychiatric: She has a normal mood and affect. Her behavior  is normal.       Procedures    MDM  MEDICAL DECISION MAKING    Vitals:    Filed Vitals:    05/13/10 1337   BP: 110/67   Pulse: 110   Temp: 97.5 ??F (36.4 ??C)   TempSrc: Oral   Resp: 16   Height: 5\' 5"  (1.651 m)   Weight: 130 lb (58.968 kg)   SpO2: 100%       LABS:   Labs Reviewed   POCT GLUCOSE MONITORING DEVICE - Abnormal; Notable for the following:     POC Glucose 142 (*)     All other components within normal limits   POC URINALYSIS   POCT GLUCOSE        Remainder of labs reviewed and were negative at this time or not returned yet.      EKG: N/A    X-RAYS: The attending, Leighton Roach, MD, and I have reviewed radiologic plain film image(s).  The plain films will be read or overread by the  radiologist.  ALL OTHER NON-PLAIN FILM IMAGES SUCH AS CT, ULTRASOUND AND MRI HAVE BEEN READ BY THE RADIOLOGIST.        No results found.     PROCEDURES: N/A    CRITICAL CARE:  As a Corporate treasurer I am not authorized to provide.  Please see the attending note for any critical care.    CONSULTATIONS: N/A    MEDICAL DECISION MAKING / ED COURSE:      In the ED today the patient was seen and evaluated by my self as well as my attending. The patient's pain is felt to be due to sciatica rather than obstetric in origin. She does have a history of gestational diabetes and because of this was considered high risk so she was recently advised by her OB to obtain a new OB doctor and follow her. [facility] and has a followup appointment for July 26. Immediate today she had a fingerstick blood sugar that was 142 with a UA that had no urine glucose. She had normal fetal heart tones. She was given one Vicodin p.o. For her pain immediately. As well as a prescription for 5 Vicodin to take only if needed for severe pain. The patient has been explained the risks and benefits of taking this medication while pregnant. All of her questions were answered prior to discharge.    The patient tolerated their visit well.  They were seen and evaluated by the attending physician who agreed with the assessment and plan.  The patient and / or the family were informed of the results of any tests, a time was given to answer questions, a plan was proposed and they agreed with plan.      DISCHARGE DIAGNOSIS:  1. Sciatica        DISCHARGE MEDICATIONS:  New Prescriptions    HYDROCODONE-ACETAMINOPHEN (VICODIN) 5-500 MG PER TABLET    Take 1 tablet by mouth every 6 hours as needed for Pain for 5 doses. WARNING:  May cause drowsiness.  May impair ability to operate vehicles or machinery.  Do not use in combination with alcohol.         (Please note the MDM and HPI sections of this note were completed with a voice recognition program.   Efforts were made to edit the dictations but occasionally words are mis-transcribed.)        [place], [place]  05/13/10 1543    Jomarie Longs  Durene Romans, MD  05/13/10 2246

## 2010-05-13 NOTE — ED Notes (Signed)
Finger stick glucose 142 and L Bloand pac aware.    [place] Aronda Burford, RN  05/13/10 1446

## 2010-05-13 NOTE — ED Notes (Signed)
Fetal heart beat 150 middle of lower abdomen    [name] Lameka Disla, RN  05/13/10 [phone]

## 2010-05-13 NOTE — ED Notes (Signed)
This patient was seen by the mid-level provider.  I have seen and examined the patient, agree with the workup, evaluation, management and diagnosis.  Care plan has been discussed.      Leighton Roach, MD  05/13/10 [phone]

## 2010-05-13 NOTE — Discharge Instructions (Signed)
Lumbar Radiculopathy, Sciatica     Sciatica is a weakness and/or changes in sensation (tingling, jolts, hot and cold, numbness) along the path the sciatic nerve travels. Irritation or damage to lumbar nerve roots is often also referred to as lumbar radiculopathy.      Lumbar radiculopathy (Sciatica) is the most common form of this problem. Radiculopathy can occur in any of the nerves coming out of the spinal cord. The problems caused depend on which nerves are involved. The sciatic nerve is the large nerve supplying the branches of nerves going from the hip to the toes. It often causes a numbness or weakness in the skin and/or muscles that the sciatic nerve serves. It also may cause symptoms (problems) of pain, burning, tingling, or electric shock-like feelings in the path of this nerve. This usually comes from injury to the fibers that make up the sciatic nerve. Some of these symptoms are low back pain and/or unpleasant feelings in the following areas:   From the mid-buttock down the back of the leg to the back of the knee.   And/or the outside of the calf and top of the foot.   And/or behind the inner ankle to the sole of the foot.     CAUSES   Herniated or slipped disc. Discs are the little cushions between the bones in the back.   Pressure by the piriformis muscle in the buttock on the sciatic nerve (Piriformis Syndrome).   Misalignment of the bones in the lower back and buttocks (Sacroiliac Joint Derangement).   Narrowing of the spinal canal that puts pressure on or pinches the fibers that make up the sciatic nerve.   A slipped vertebra that is out of line with those above or beneath it.   Abnormality of the nervous system itself so that nerve fibers do not transmit signals properly, especially to feet and calves (neuropathy).   Tumor (this is rare).     Your caregiver can usually determine the cause of your sciatica and begin the treatment most likely to help you.     TREATMENT  Taking  over-the-counter painkillers, physical therapy, rest, exercise, spinal manipulation, and injections of anesthetics, steroids and/or Botulinum toxins may be used. Surgery, acupuncture, and Yoga can also be effective. Mind over matter techniques, mental imagery, and changing factors such as your bed, chair, desk height, posture, and activities are other treatments that may be helpful. You and your caregiver can help determine what is best for you. With proper diagnosis (learning what is wrong), the cause of most sciatica can be identified and removed. Communication and cooperation between your caregiver and you is essential. If you are not successful immediately, do not be discouraged. With time, a proper treatment can be found that will make you comfortable.     HOME CARE INSTRUCTIONS   If the pain is coming from a problem in the back, applying ice to that area for 15 to 20 minutes, four times per day while awake, may be helpful. Put the ice in a plastic bag. Place a towel between the bag of ice and your skin.   You may exercise or perform your usual activities if these do not aggravate your pain, or as suggested by your caregiver.   Only take over-the-counter or prescription medicines for pain, discomfort, or fever as directed by your caregiver.    If your caregiver has given you a follow-up appointment, it is very important to keep that appointment. Not keeping the appointment could result in   a chronic or permanent injury, pain, and disability. If there is any problem keeping the appointment, you must call back to this facility for assistance.      SEEK IMMEDIATE MEDICAL CARE IF:   You experience loss of control of bowel or bladder.   You have increasing weakness in the trunk, buttocks, or legs.   There is numbness in any areas from the hip down to the toes.   You have difficulty walking or keeping your balance.   You have any of the above, with fever or forceful vomiting.     Document Released: 11/07/2007   Document Re-Released: 01/14/2009  [employer] Patient Information 2011 [employer].

## 2010-05-13 NOTE — ED Notes (Signed)
Pain number 8 out of 10, medicated for pain with vicodin 5/500 mg as per order    [place] Delorice Bannister, RN  05/13/10 1433

## 2010-05-13 NOTE — ED Notes (Signed)
Patient is [redacted] weeks pregnant. Dr Glynis Smiles and Phineas Douglas pac in with patient    [place] Juni Glaab, RN  05/13/10 1348

## 2010-08-19 ENCOUNTER — Inpatient Hospital Stay
Admit: 2010-08-19 | Discharge: 2010-08-19 | Disposition: A | Discharge status: Home / Self Care | Attending: Emergency Medicine

## 2010-08-19 MED ORDER — MORPHINE SULFATE 4 MG/ML IJ SOLN
4 MG/ML | Freq: Once | INTRAMUSCULAR | Status: AC
Start: 2010-08-19 — End: 2010-08-19
  Administered 2010-08-19: 18:00:00 4 mg via INTRAMUSCULAR

## 2010-08-19 MED ORDER — IBUPROFEN 800 MG PO TABS
800 MG | Freq: Once | ORAL | Status: AC
Start: 2010-08-19 — End: 2010-08-19
  Administered 2010-08-19: 18:00:00 800 mg via ORAL

## 2010-08-19 MED ORDER — HYDROCODONE-ACETAMINOPHEN 5-500 MG PO TABS
5-500 MG | ORAL_TABLET | Freq: Four times a day (QID) | ORAL | Status: DC | PRN
Start: 2010-08-19 — End: 2010-10-08

## 2010-08-19 MED FILL — MORPHINE SULFATE 4 MG/ML IJ SOLN: 4 mg/mL | INTRAMUSCULAR | Qty: 1

## 2010-08-19 MED FILL — IBUPROFEN 800 MG PO TABS: 800 MG | ORAL | Qty: 1

## 2010-08-19 NOTE — Discharge Instructions (Signed)
Headaches, FAQ's  (Frequently Asked Questions, NHF)     MIGRAINE HEADACHES  Q: What is migraine? What causes it? How can I treat it?  A: Generally, migraine headaches begin as a dull ache. Then they develop into a constant, throbbing, and pulsating pain. You may experience pain at the temples. You may experience pain at the front or back of one or both sides of the head. The pain is usually accompanied by a combination of:   Nausea.  Vomiting.   Sensitivity to light and noise.   Some people (about 15%) experience an aura (see below) before an attack. The cause of migraine is believed to be chemical reactions in the brain. Treatment for migraine may include over-the-counter or prescription medications. It may also include self-help techniques. These include relaxation training and biofeedback.      Q: What is an aura?   A: About 15% of people with migraine get an "aura". This is a sign of neurological symptoms that occur before a migraine headache. You may see wavy or jagged lines, dots, or flashing lights. You might experience tunnel vision or blind spots in one or both eyes. The aura can include visual or auditory hallucinations (something imagined). It may include disruptions in smell (such as strange odors), taste or touch. Other symptoms include:    Numbness.    A "pins and needles" sensation.    Difficulty in recalling or speaking the correct word.  These neurological events may last as long as 60 minutes. These symptoms will fade as the headache begins.     Q: What is a trigger?  A: Certain physical or environmental factors can lead to or "trigger" a migraine. These include:   Foods.    Hormonal changes.   Weather.   Stress.    It is important to remember that triggers are different for everyone. To help prevent migraine attacks, you need to figure out which triggers affect you. Keep a headache diary. This is a good way to track triggers. The diary will help you talk to your healthcare professional  about your condition.     Q: Does weather affect migraines?  A: Bright sunshine, hot, humid conditions, and drastic changes in barometric pressure may lead to, or "trigger," a migraine attack in some people. But studies have shown that weather does not act as a trigger for everyone with migraines.     Q: What is the link between migraine and hormones?  A: Hormones start and regulate many of your body's functions. Hormones keep your body in balance within a constantly changing environment. The levels of hormones in your body are unbalanced at times. Examples are during menstruation, pregnancy, or menopause. That can lead to a migraine attack. In fact, about three quarters of all women with migraine report that their attacks are related to the menstrual cycle.      Q: Is there an increased risk of stroke for migraine sufferers?  A: The likelihood of a migraine attack causing a stroke is very remote. That is not to say that migraine sufferers cannot have a stroke associated with their migraines. In persons under age 11, the most common associated factor for stroke is migraine headache. But over the course of a person's normal life span, the occurrence of migraine headache may actually be associated with a reduced risk of dying from cerebrovascular disease due to stroke.      Q: What are acute medications for migraine?  A: Acute medications are used to treat  the pain of the headache after it has started. Examples over-the-counter medications, NSAIDs, ergots, and triptans.      Q: What are the triptans?  A: Triptans are the newest class of abortive medications. They are specifically targeted to treat migraine. Triptans are vasoconstrictors. They moderate some chemical reactions in the brain. The triptans work on receptors in your brain. Triptans help to restore the balance of a neurotransmitter called serotonin. Fluctuations in levels of serotonin are thought to be a main cause of migraine.      Q: Are over-the-counter  medications for migraine effective?  A: Over-the-counter, or "OTC," medications may be effective in relieving mild to moderate pain and associated symptoms of migraine. But you should see your caregiver before beginning any treatment regimen for migraine.      Q: What are preventive medications for migraine?  A: Preventive medications for migraine are sometimes referred to as "prophylactic" treatments. They are used to reduce the frequency, severity, and length of migraine attacks.  Examples of preventive medications include antiepileptic medications, antidepressants, beta-blockers, calcium channel blockers, and NSAIDs (nonsteroidal anti-inflammatory drugs).     Q: Why are anticonvulsants used to treat migraine?  A: During the past few years, there has been an increased interest in antiepileptic drugs for the prevention of migraine. They are sometimes referred to as "anticonvulsants". Both epilepsy and migraine may be caused by similar reactions in the brain.      Q: Why are antidepressants used to treat migraine?  A: Antidepressants are typically used to treat people with depression. They may reduce migraine frequency by regulating chemical levels, such as serotonin,  in the brain.      Q: What alternative therapies are used to treat migraine?  A: The term "alternative therapies" is often used to describe treatments considered outside the scope of conventional Western medicine. Examples of alternative therapy include acupuncture, acupressure, and yoga. Another common alternative treatment is herbal therapy. Some herbs are believed to relieve headache pain. Always discuss alternative therapies with your caregiver before proceeding. Some herbal products contain arsenic and other toxins.     TENSION HEADACHES  Q: What is a tension-type headache? What causes it? How can I treat it?  A: Tension-type headaches occur randomly. They are often the result of temporary stress, anxiety, fatigue, or anger. Symptoms include  soreness in your temples, a tightening band-like sensation around your head (a "vice-like" ache). Symptoms can also include a pulling feeling, pressure sensations, and contracting head and neck muscles. The headache begins in your forehead, temples, or the back of your head and neck. Treatment for tension-type headache may include over-the-counter or prescription medications. Treatment may also include self-help techniques such as relaxation training and biofeedback.     CLUSTER HEADACHES  Q: What is a cluster headache? What causes it? How can I treat it?  A: Cluster headache gets its name because the attacks come in groups. The pain arrives with little, if any, warning. It is usually on one side of the head. A tearing or bloodshot eye and a runny nose on the same side of the headache may also accompany the pain. Cluster headaches are believed to be caused by chemical reactions in the brain. They have been described as the most severe and intense of any headache type. Treatment for cluster headache includes prescription medication and oxygen.     SINUS HEADACHES  Q: What is a sinus headache? What causes it? How can I treat it?  A: When a cavity in  the bones of the face and skull (a sinus) becomes inflamed, the inflammation will cause localized pain. This condition is usually the result of an allergic reaction, a tumor, or an infection. If your headache is caused by a sinus blockage, such as an infection, you will probably have a fever. An x-ray will confirm a sinus blockage. Your caregiver's treatment might include antibiotics for the infection, as well as antihistamines or decongestants.      REBOUND HEADACHES  Q: What is a rebound headache? What causes it? How can I treat it?  A: A pattern of taking acute headache medications too often can lead to a condition known as "rebound headache." A pattern of taking too much headache medication includes taking it more than 2 days per week or in excessive amounts. That  means more than the label or a caregiver advises. With rebound headaches, your medications not only stop relieving pain, they actually begin to cause headaches. Doctors treat rebound headache by tapering the medication that is being overused. Sometimes your caregiver will gradually substitute a different type of treatment or medication. Stopping may be a challenge. Regularly overusing a medication increases the potential for serious side effects. Consult a caregiver if you regularly use headache medications more than 2 days per week or more than the label advises.     ADDITIONAL QUESTIONS AND ANSWERS  Q: What is biofeedback?  A: Biofeedback is a self-help treatment. Biofeedback uses special equipment to monitor your body's involuntary physical responses. Biofeedback monitors:   Breathing.    Pulse.    Heart rate.   Temperature.    Muscle tension.    Brain activity.    Biofeedback helps you refine and perfect your relaxation exercises. You learn to control the physical responses that are related to stress. Once the technique has been mastered, you do not need the equipment any more.     Q: Are headaches hereditary?  A: Four out of five (80%) of people that suffer report a family history of migraine. Scientists are not sure if this is genetic or a family predisposition. Despite the uncertainty, a child has a 50% chance of having migraine if one parent suffers. The child has a 75% chance if both parents suffer.      Q: Can children get headaches?  A: By the time they reach high school, most young people have experienced some type of headache. Many safe and effective approaches or medications can prevent a headache from occurring or stop it after it has begun.      Q: What type of doctor should I see to diagnose and treat my headache?  A: Start with your primary caregiver. Discuss his or her experience and approach to headaches. Discuss methods of classification, diagnosis, and treatment. Your caregiver may decide  to recommend you to a headache specialist, depending upon your symptoms or other physical conditions. Having diabetes, allergies, etc., may require a more comprehensive and inclusive approach to your headache. The National Headache Foundation will provide, upon request, a list of [facility] physician members in your state.      This information courtesy of the [facility] Headache Foundation 1-888-NHF-5552  internet: www.headaches.org     Document Released: 01/08/2004  Document Re-Released: 11/09/2009  [facility] Patient Information 2011 [place], [place]

## 2010-08-19 NOTE — ED Provider Notes (Signed)
HPI  Chief Complaint:    Chief Complaint   Patient presents with   ??? Headache       Nursing Notes, Past Medical Hx, Past Surgical Hx, Social Hx, Allergies, and Family Hx were all reviewed and agreed with or any disagreements were addressed in the HPI.    HPI:    [name] [name] is a 29 y.o. female Planning of a headache. She does have a history migraine headaches. She had a baby several weeks ago and had an epidural. She states she did not have any headaches at that time. However yesterday she developed the onset of her usual typical frontal migraine headache. She also has pain in the left upper neck. She denies any nausea or vomiting. She denies any fever. She denies any weakness or numbness. She is not currently breast-feeding.    Review of Systems   Constitutional: Negative for fever and chills.   HENT: Positive for neck pain. Negative for ear pain, sore throat, neck stiffness and ear discharge.    Respiratory: Negative for cough and shortness of breath.    Cardiovascular: Negative for chest pain.   Gastrointestinal: Negative for abdominal pain.   Genitourinary: Negative for dysuria, urgency, flank pain, difficulty urinating and pelvic pain.   Musculoskeletal: Negative for back pain.   Neurological: Positive for headaches. Negative for dizziness, weakness and numbness.   All other systems reviewed and are negative.        Physical Exam   Nursing note and vitals reviewed.  Constitutional: She is oriented to person, place, and time. She appears well-developed and well-nourished. No distress.   HENT:   Head: Normocephalic and atraumatic.   Right Ear: External ear normal.   Left Ear: External ear normal.   Nose: Nose normal.   Eyes: EOM are normal. Pupils are equal, round, and reactive to light. Right eye exhibits no discharge. Left eye exhibits no discharge.   Neck: Normal range of motion. Neck supple.   Cardiovascular: Normal rate, regular rhythm and normal heart sounds.    Pulmonary/Chest: Effort normal and  breath sounds normal. No respiratory distress. She has no wheezes.   Musculoskeletal: Normal range of motion. She exhibits no edema.   Neurological: She is alert and oriented to person, place, and time. She displays normal reflexes. No cranial nerve deficit. Coordination normal.   Skin: Skin is warm and dry.   Psychiatric: She has a normal mood and affect. Her behavior is normal.       Procedures    MDM    MEDICAL DECISION MAKING    Vitals:  BP 131/84   Pulse 81   Temp(Src) 97.3 ??F (36.3 ??C) (Oral)   Resp 18   Ht 5\' 3"  (1.6 m)   Wt 140 lb (63.504 kg)   BMI 24.80 kg/m2   SpO2 99%   Breastfeeding? No    Labs:  Labs Reviewed - No data to display    Remainder of labs reviewed and were negative at this time.    EKG:       X-RAYS:        PROCEDURES: N/A    CRITICAL CARE:  Not applicable    CONSULTATIONS: N/A    MEDICAL DECISION MAKING / ED COURSE:    She was given a shot of morphine for pain and is feeling better.    The patient tolerated their visit well.  The patient and / or the family were informed of the results of any tests, a time was  given to answer questions, a plan was proposed and they agreed with plan.      DISCHARGE DIAGNOSIS:  1. Migraine        (Please note the MDM and HPI sections of this note were completed with a voice recognition program.  Efforts were made to edit the dictations but occasionally words are mis-transcribed.)          Fenton Malling, MD  08/19/10 1422

## 2010-08-19 NOTE — ED Notes (Signed)
Upon discharge, pt requesting to have 800 mg of Ibuprofen. MD notified. New orders placed.     Berneice Heinrich, RN  08/19/10 1424

## 2010-09-23 NOTE — ED Provider Notes (Signed)
Patient was seen examined, and discussed with the mid-level provider, I agree with their assessment and plan.      Vasiliy Mccarry T Chrisopher Pustejovsky, MD  09/23/10 2240

## 2010-09-23 NOTE — ED Provider Notes (Signed)
HPI Comments: Chief Complaint:  Patient presents with:    Ankle Pain - chronic over last few months      Nursing Notes, Past Medical Hx, Past Surgical Hx, Social Hx, Allergies, and Family Hx were all reviewed and agreed with or any disagreements were addressed in the HPI.    HPI:    [name] [name] is a 29 y.o. female This is to the ER complaining of right ankle pain. She states she has had right ankle pain for many months which is chronic in nature. However today she had an incident where she rolled her right ankle when she was walking and heels. She states that she denies immediate effusion or a pop to the ankle. She is able to ambulate with minimal pain. She denies numbness or tingling. She denies any pain in her past. She states that she has been seen by her primary care physician for this in the past and she is scheduled to see a pain specialist for her chronic neck pain. She takes Vicodin at home which did not help her pain. She has no other complaints.         Review of Systems   All other systems reviewed and are negative.        Physical Exam   Nursing note and vitals reviewed.  Constitutional: She is oriented to person, place, and time. She appears well-developed and well-nourished. No distress.   HENT:   Head: Normocephalic.   Eyes: EOM are normal. Pupils are equal, round, and reactive to light.   Neck: Neck supple.   Cardiovascular: Normal rate, regular rhythm, normal heart sounds and intact distal pulses.    Pulmonary/Chest: Breath sounds normal. She has no rales.   Abdominal: Soft. Bowel sounds are normal. She exhibits no distension. No tenderness. She has no guarding.   Musculoskeletal: She exhibits no edema.        There is no tenderness to the lateral or medial ankle. She has minimal tenderness of the ATF ligament on the right. No metatarsal tenderness. She has 2+ pulses and reflexes. Neurovascularly intact. Negative Thompson Squeeze. Able to bear weight.   Lymphadenopathy:     She has no  cervical adenopathy.   Neurological: She is alert and oriented to person, place, and time.   Skin: Skin is warm and dry. She is not diaphoretic.   Psychiatric: She has a normal mood and affect.       Procedures    MDM  Number of Diagnoses or Management Options  Diagnosis management comments: MEDICAL DECISION MAKING    Vitals:  BP 130/90   Pulse 67   Temp(Src) 98.1 ??F (36.7 ??C) (Oral)   Resp 18   Ht 5\' 5"  (1.651 m)   Wt 132 lb (59.875 kg)   BMI 21.97 kg/m2   SpO2 100%    MEDICAL DECISION MAKING / ED COURSE:    This 29 year old female presents to the ER with right ankle pain. She has a history of chronic right ankle pain. She did have an acute injury today where she rolled her right ankle while wearing heels. Her exam was very benign but given the fact that she did have an injury, I performed an x-ray of her right ankle. This was negative for any acute bony abnormalities. She was able to bear weight easily and has excellent range of motion. I placed her in an Ace wrap and crutches and will have her use over-the-counter Motrin. She is to followup with her primary  care physician. Of note she does have Vicodin at home that she can take for breakthrough pain.    The patient tolerated their visit well.  The patient and / or the family were informed of the results of any tests, a time was given to answer questions, a plan was proposed and they agreed with plan.      DISCHARGE DIAGNOSIS:  Right ankle sprain    PLAN:  1.Rest, ice, elevation  2. OTC Motrin for pain  3. Followup with her primary care physician    (Please note the MDM and HPI sections of this note were completed with a voice recognition program.  Efforts were made to edit the dictations but occasionally words are mis-transcribed.)              Sherian Rein, PA  09/23/10 2219    Mardi Mainland, MD  09/23/10 2241

## 2010-09-23 NOTE — Discharge Instructions (Signed)
Ankle Sprain     An ankle sprain is an injury to the ligaments that hold the ankle joint together.      CAUSE  The injury is usually caused by a fall or by twisting the ankle. It is important to tell your caregiver how the injury occurred and whether or not you were able to walk immediately after the injury.      SYMPTOMS  Pain is the primary symptom. It may be present at rest or only when you are trying to stand or walk. The ankle will likely be swollen. Bruising may develop immediately or after 1 or 2 days. It may be difficult or impossible to stand or walk. This depends on the severity of the sprain.     DIAGNOSIS  Your caregiver can determine if a sprain has occurred based on the accident details and on examination of your ankle. Examination will include pressing and squeezing areas of the foot and ankle. Your caregiver will try to move the ankle in certain ways. X-rays may be used to be sure a bone was not broken, or that the ligament did not pull off of a bone (avulsion). There are standard guidelines that can reliably determine if an x-ray is needed.     RISKS AND COMPLICATIONS  A person who has sprained their ankle will be more prone to a repeat sprain. Long term pain with standing or walking or difficulty walking (chronic instability of the ankle) may result from an ankle sprain.     TREATMENT  Rest, ice, elevation, and compression are the basic modes of treatment (see home care instructions, below). Certain types of braces can help stabilize the ankle and allow early return to walking. Your caregiver can make a recommendation for this. Medication may be recommended for pain. You may be referred to an orthopedist or a physical therapist for certain types of severe sprains.     HOME CARE INSTRUCTIONS   Apply ice to the sore area for 15 to 20 minutes four times per day. Do this while you are awake for the first 2 days, or as directed. This can be stopped when the swelling goes away. Put the ice in a plastic  bag and place a towel between the bag of ice and your skin.   Keep your leg elevated when possible to lessen swelling.   If your caregiver recommends crutches, use them as instructed with a non-weight bearing cast for 1 week. Then, you may walk on your ankle as the pain allows, or as instructed. Gradually, put weight on the affected ankle. Continue to use crutches or cane until you can walk without causing pain.   If a plaster splint was applied, wear the splint until you are seen for a follow-up examination. Rest it on nothing harder than a pillow the first 24 hours. DO NOT put weight on it. DO NOT get it wet. You may take it off to take a shower or bath.    You may have been given an elastic bandage to use with the plaster splint, or you may have been given a elastic bandage to use alone. The elastic bandage is too tight if you have numbness, tingling, or if your foot becomes cold and blue. Adjust the bandage to make it comfortable.   If an air splint was applied, you may blow more air into it or take some out to make it more comfortable. You may take it off at night and to take a shower  or bath. Wiggle your toes in the splint several times per day if you are able.   Only take over-the-counter or prescription medicines for pain, discomfort, or fever as directed by your caregiver.    Do not drive a vehicle until your caregiver specifically tells you it is safe to do so.      SEEK MEDICAL CARE IF:   You have an increase in bruising, swelling, or pain.   You notice coldness of your toes.   Pain relief is not achieved with medications.     SEEK IMMEDIATE MEDICAL CARE IF: your toes are numb or blue or you have severe pain.     MAKE SURE YOU:    Understand these instructions.    Will watch your condition.   Will get help right away if you are not doing well or get worse.     Document Released: 11/07/2007  Document Re-Released: 01/14/2009  [facility] Patient Information 2011 [place], [place].    PLAN:  1. Rest,  Ice, elevation  2. OTC Motrin  3. Follow up with your PCP.

## 2010-09-24 ENCOUNTER — Inpatient Hospital Stay
Admit: 2010-09-24 | Discharge: 2010-09-24 | Disposition: A | Discharge status: Home / Self Care | Attending: Emergency Medicine

## 2010-09-24 MED ORDER — IBUPROFEN 800 MG PO TABS
800 MG | Freq: Once | ORAL | Status: AC
Start: 2010-09-24 — End: 2010-09-23
  Administered 2010-09-24: 03:00:00 800 mg via ORAL

## 2010-09-24 MED ORDER — OXYCODONE-ACETAMINOPHEN 5-325 MG PO TABS
5-325 MG | Freq: Once | ORAL | Status: AC
Start: 2010-09-24 — End: 2010-09-24
  Administered 2010-09-24: 22:00:00 2 via ORAL

## 2010-09-24 MED ORDER — OXYCODONE-ACETAMINOPHEN 5-325 MG PO TABS
5-325 MG | Freq: Once | ORAL | Status: DC
Start: 2010-09-24 — End: 2010-09-23

## 2010-09-24 MED FILL — PERCOCET 5-325 MG PO TABS: 5-325 MG | ORAL | Qty: 2

## 2010-09-24 MED FILL — IBUPROFEN 800 MG PO TABS: 800 MG | ORAL | Qty: 1

## 2010-09-24 NOTE — ED Provider Notes (Signed)
HPI Comments: Incidentally the patient was here yesterday for similar complaint of right ankle pain. She had radiographs done and showed no evidence of a fracture and she was diagnosed with a right ankle sprain. The patient states that today while stepping off a stair she twisted her ankle again. Based on that the patient decided to come back to the emergency room for evaluation of her right ankle. No other injuries or complaints this time.    Patient is a 29 y.o. female presenting with ankle problem.   Ankle Problem   The incident occurred less than 1 hour ago. The incident occurred at home. The injury mechanism was a fall. The pain is present in the right ankle. The quality of the pain is described as aching and throbbing. The pain is severe. The pain has been constant since onset. Pertinent negatives include no numbness, no inability to bear weight, no loss of motion, no muscle weakness, no loss of sensation and no tingling. The symptoms are aggravated by bearing weight, activity and palpation. She has tried nothing for the symptoms.       Review of Systems   Constitutional: Negative.    HENT: Negative.    Eyes: Negative.    Respiratory: Negative.    Cardiovascular: Negative.    Gastrointestinal: Negative.    Genitourinary: Negative.    Musculoskeletal: Positive for arthralgias.        See history of present illness   Skin: Negative.    Neurological: Negative.  Negative for tingling and numbness.   Hematological: Negative.    Psychiatric/Behavioral: Negative.    All other systems reviewed and are negative.        Physical Exam   Nursing note and vitals reviewed.  Constitutional: She appears well-developed and well-nourished. No distress.   HENT:   Head: Normocephalic.   Nose: Nose normal.   Eyes: Conjunctivae and EOM are normal. No scleral icterus.   Neck: Normal range of motion. No tracheal deviation present.   Musculoskeletal: She exhibits tenderness.        Right ankle: She exhibits decreased range of  motion. She exhibits no swelling, no ecchymosis and no deformity. tenderness. No lateral malleolus and no medial malleolus tenderness found.   Neurological: She is alert. She exhibits normal muscle tone. Coordination normal.   Skin: Skin is warm. No rash noted. She is not diaphoretic.   Psychiatric: She has a normal mood and affect. Her behavior is normal.       Procedures    MDM  Number of Diagnoses or Management Options  Diagnosis management comments: The patient presents to the emergency department for right ankle pain after twisting an already sprained right ankle. Her exam is concerning for fracture at this point seems to be more consistent with a right ankle sprain. The patient was given 2 Percocet during her time in the department. Provided there are no radiographic abnormalities On her right ankle film the patient will be discharged to home with recommend followup with a primary care physician as needed and a request to return to the emergency department for any further questions concerns or emergencies that she may have.      Labs      Radiology       EKG Interpretation      Valente David, MD  Resident  09/24/10 1754    Leota Sauers, MD  09/25/10 [phone]

## 2010-09-24 NOTE — ED Notes (Signed)
Pt awaiting dr was here yesterday with fall and now here for reinjury of rt foot    Berniece Andreas, RN  09/24/10 [phone]

## 2010-09-24 NOTE — Discharge Instructions (Signed)
Ankle Sprain     An ankle sprain is an injury to the ligaments that hold the ankle joint together.      CAUSE  The injury is usually caused by a fall or by twisting the ankle. It is important to tell your caregiver how the injury occurred and whether or not you were able to walk immediately after the injury.      SYMPTOMS  Pain is the primary symptom. It may be present at rest or only when you are trying to stand or walk. The ankle will likely be swollen. Bruising may develop immediately or after 1 or 2 days. It may be difficult or impossible to stand or walk. This depends on the severity of the sprain.     DIAGNOSIS  Your caregiver can determine if a sprain has occurred based on the accident details and on examination of your ankle. Examination will include pressing and squeezing areas of the foot and ankle. Your caregiver will try to move the ankle in certain ways. X-rays may be used to be sure a bone was not broken, or that the ligament did not pull off of a bone (avulsion). There are standard guidelines that can reliably determine if an x-ray is needed.     RISKS AND COMPLICATIONS  A person who has sprained their ankle will be more prone to a repeat sprain. Long term pain with standing or walking or difficulty walking (chronic instability of the ankle) may result from an ankle sprain.     TREATMENT  Rest, ice, elevation, and compression are the basic modes of treatment (see home care instructions, below). Certain types of braces can help stabilize the ankle and allow early return to walking. Your caregiver can make a recommendation for this. Medication may be recommended for pain. You may be referred to an orthopedist or a physical therapist for certain types of severe sprains.     HOME CARE INSTRUCTIONS   Apply ice to the sore area for 15 to 20 minutes four times per day. Do this while you are awake for the first 2 days, or as directed. This can be stopped when the swelling goes away. Put the ice in a plastic  bag and place a towel between the bag of ice and your skin.   Keep your leg elevated when possible to lessen swelling.   If your caregiver recommends crutches, use them as instructed with a non-weight bearing cast for 1 week. Then, you may walk on your ankle as the pain allows, or as instructed. Gradually, put weight on the affected ankle. Continue to use crutches or cane until you can walk without causing pain.   If a plaster splint was applied, wear the splint until you are seen for a follow-up examination. Rest it on nothing harder than a pillow the first 24 hours. DO NOT put weight on it. DO NOT get it wet. You may take it off to take a shower or bath.    You may have been given an elastic bandage to use with the plaster splint, or you may have been given a elastic bandage to use alone. The elastic bandage is too tight if you have numbness, tingling, or if your foot becomes cold and blue. Adjust the bandage to make it comfortable.   If an air splint was applied, you may blow more air into it or take some out to make it more comfortable. You may take it off at night and to take a shower   or bath. Wiggle your toes in the splint several times per day if you are able.   Only take over-the-counter or prescription medicines for pain, discomfort, or fever as directed by your caregiver.    Do not drive a vehicle until your caregiver specifically tells you it is safe to do so.      SEEK MEDICAL CARE IF:   You have an increase in bruising, swelling, or pain.   You notice coldness of your toes.   Pain relief is not achieved with medications.     SEEK IMMEDIATE MEDICAL CARE IF: your toes are numb or blue or you have severe pain.     MAKE SURE YOU:    Understand these instructions.    Will watch your condition.   Will get help right away if you are not doing well or get worse.     Document Released: 11/07/2007  Document Re-Released: 01/14/2009  [employer] Patient Information 2011 [employer].

## 2010-09-25 NOTE — ED Provider Notes (Signed)
Patient seen and examined.  Discussed with the resident.  Agree with assessment and plan.    Leota Sauers, MD  09/25/10 [phone]

## 2010-10-08 ENCOUNTER — Inpatient Hospital Stay
Admit: 2010-10-08 | Discharge: 2010-10-08 | Disposition: A | Discharge status: Home / Self Care | Attending: Emergency Medicine

## 2010-10-08 MED ORDER — HYDROCODONE-ACETAMINOPHEN 5-500 MG PO TABS
5-500 MG | ORAL_TABLET | Freq: Four times a day (QID) | ORAL | Status: AC | PRN
Start: 2010-10-08 — End: 2010-10-11

## 2010-10-08 MED ORDER — CYCLOBENZAPRINE HCL 10 MG PO TABS
10 MG | Freq: Once | ORAL | Status: AC
Start: 2010-10-08 — End: 2010-10-08
  Administered 2010-10-08: 16:00:00 10 mg via ORAL

## 2010-10-08 MED ORDER — NAPROXEN 500 MG PO TABS
500 MG | ORAL_TABLET | Freq: Two times a day (BID) | ORAL | Status: DC
Start: 2010-10-08 — End: 2013-10-19

## 2010-10-08 MED ORDER — OXYCODONE-ACETAMINOPHEN 5-325 MG PO TABS
5-325 MG | Freq: Once | ORAL | Status: AC
Start: 2010-10-08 — End: 2010-10-08
  Administered 2010-10-08: 16:00:00 2 via ORAL

## 2010-10-08 MED ORDER — CYCLOBENZAPRINE HCL 10 MG PO TABS
10 MG | ORAL_TABLET | Freq: Three times a day (TID) | ORAL | Status: AC | PRN
Start: 2010-10-08 — End: 2010-10-12

## 2010-10-08 MED FILL — CYCLOBENZAPRINE HCL 10 MG PO TABS: 10 MG | ORAL | Qty: 1

## 2010-10-08 MED FILL — PERCOCET 5-325 MG PO TABS: 5-325 MG | ORAL | Qty: 2

## 2010-10-08 NOTE — ED Provider Notes (Signed)
HPI  The patient is a 29 year old female presenting with right leg pain. The patient reports that she had a C1-C2 spinal fusion surgery 7 years ago, and since that time as a chronic history of right neck pain. She has followed with a pain specialist in the past, but has lost that followup since that time. The patient reports that over the past one to 2 days she's had worsening of right-sided neck pain which she describes in the paraspinal muscles of the cervical spine and trapezius area. She denies any trauma or other acute event leading to the current pain.  She states that she has been taking over the counter anti-inflammatory medication on a non-scheduled basis for treatment of the pain.      At the time of history and physical examination today the patient denies any headache, change vision, chest pain, difficulty breathing, abdominal pain, recent vomiting, recent diarrhea, recent fevers, chills, sweats, and also denies any pain, weakness number numbness in the upper or lower extremities.    Review of Systems  Please see history of present illness section of this note for pertinent positive and negative review of systems.  All other systems are negative except as noted above.    Family history, social history, and past medical history have been reviewed    Physical Exam   Constitutional: She is oriented to person, place, and time. She appears well-developed and well-nourished. No distress.   HENT:   Head: Normocephalic and atraumatic.   Right Ear: External ear normal.   Left Ear: External ear normal.   Nose: Nose normal.   Mouth/Throat: Oropharynx is clear and moist. No oropharyngeal exudate.   Eyes: Conjunctivae and EOM are normal. Pupils are equal, round, and reactive to light. Right eye exhibits no discharge. Left eye exhibits no discharge. No scleral icterus.   Neck: Normal range of motion. Neck supple.   Cardiovascular: Normal rate, regular rhythm, normal heart sounds and intact distal pulses.  Exam  reveals no gallop and no friction rub.    No murmur heard.  Pulmonary/Chest: Effort normal and breath sounds normal. No respiratory distress. She has no wheezes. She has no rales.   Abdominal: Soft. Bowel sounds are normal. She exhibits no distension. No tenderness. She has no rebound and no guarding.   Musculoskeletal: Normal range of motion. She exhibits no edema and no tenderness.        The patient has mild right cervical paraspinal tenderness and right trapezius tenderness to palpation, there is no tenderness to palpation of the cervical spine. The patient has full range of motion of the cervical spine including flexion, extension, lateral flexion to the left and right, and turning of the head to the left and right with minimal discomfort.   Neurological: She is alert and oriented to person, place, and time. She has normal reflexes. No cranial nerve deficit.        The patient has 5 out of 5 motor strength and normal sensation the upper and lower extremities. Cranial nerves II through XII are intact.   Skin: Skin is warm and dry. No rash noted. She is not diaphoretic. No erythema. No pallor.   Psychiatric: She has a normal mood and affect. Her behavior is normal.       Procedures    MDM  The patient was treated symptomatically for pain with one tablet of Flexeril 10 mg p.o. and 2 tablets of Percocet 5/325 p.o.  Following this she was reassessed and stated  her pain had improved. As described above the patient's pain is consistent with her chronic neck pain, it is located in the paraspinal muscles of the cervical spine on the right side as well as the right trapezius area, she has full range of motion of her neck, she has no C-spine tenderness palpation, she has no recent history of trauma or other acute event. She has no neurologic findings on physical exam.  She has no symptoms consistent with meningitis. Considering lack of concerning findings and history and physical examination, as well as the patient's  symptomatic improvement throughout her time in the emergency department, it was determined that she was appropriate for discharge.    Clinical impression:  Neck pain    Disposition:  The patient was discharged home in good condition    Plan:  1) The patient was provided with a prescription for 5 tablets of Vicodin 5/500 to be used for breakthrough pain, as well as 10 tablets of Flexeril 10 mg be taken one tablet every 6 hours as needed for muscle spasm. She was also given a prescription for 7 day course of Naprosyn 500 mg one tablet twice a day.  It was explained to the patient that she would only be provided with 5 tablets of narcotics for breakthrough pain, that the emergency department is not the correct location for management of chronic pain, and that she should continue to follow with her primary care provider and other outpatient specialist for management of chronic pain.  2) The patient was instructed to follow up with her primary care provider, spine specialist, and to present to the MRI scheduling area of the hospital for scheduling of an MRI that she states her spine specialist has ordered.  3) The patient was instructed to return to emergency department for worsening neck pain associated with fevers, vomiting, change in vision, weakness or numbness of the extremities, or any other symptoms concerning to her.    Gayla Medicus, MD  Resident  10/08/10 1215    Fenton Malling, MD  10/08/10 [phone]

## 2010-10-08 NOTE — ED Notes (Signed)
D/C amb with Rx's et written instructions. Has appt for MRI tom.  Verbalized understanding. NAD. Requested 1 week's worth of pain meds. Declined per Dr. Despina Arias.    Gordy Levan, RN  10/08/10 [phone]

## 2010-10-08 NOTE — ED Notes (Signed)
Pt provided with number to schedule her mri. Pt states ride is on the way.     Cecile Sheerer, RN  10/08/10 1140

## 2010-10-08 NOTE — ED Notes (Signed)
Pt advised to call ride home prior to discharge.     Cecile Sheerer, RN  10/08/10 1131

## 2010-10-19 ENCOUNTER — Inpatient Hospital Stay: Admit: 2012-02-25 | Disposition: A | Discharge status: Home / Self Care

## 2010-10-19 MED ORDER — CYCLOBENZAPRINE HCL 5 MG PO TABS
5 MG | ORAL_TABLET | Freq: Three times a day (TID) | ORAL | Status: DC | PRN
Start: 2010-10-19 — End: 2010-12-06

## 2010-10-19 MED ORDER — AMOXICILLIN 500 MG PO CAPS
500 MG | ORAL_CAPSULE | Freq: Three times a day (TID) | ORAL | Status: AC
Start: 2010-10-19 — End: 2010-10-31

## 2010-10-19 MED ORDER — BENZONATATE 100 MG PO CAPS
100 MG | ORAL_CAPSULE | Freq: Three times a day (TID) | ORAL | Status: DC | PRN
Start: 2010-10-19 — End: 2010-12-06

## 2010-10-19 NOTE — ED Notes (Signed)
Pt just now showed up in [facility]. [facility] was called and problem was just now resolved.    Phoebe PerchStephanie Rogue Pautler, RN  10/19/10 [phone]
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
## 2010-10-19 NOTE — Discharge Instructions (Signed)
Take the entire course of antibiotics as prescribed. Take cough medication as needed. Take muscle relaxer as needed, but do not drink alcohol, drive, or operate machinery while taking this medication, and be sure that there is someone responsible to help watch the baby if you takes this medication.  Use the air cast only occasionally, not every day, when your right ankle is feeling particularly unstable.  Call your doctor for a followup appointment.  Call your doctor or return to the emergency department for worsening symptoms or other concerns
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
## 2010-11-13 ENCOUNTER — Inpatient Hospital Stay
Admit: 2010-11-13 | Discharge: 2010-11-13 | Disposition: A | Discharge status: Home / Self Care | Attending: Emergency Medicine

## 2010-11-13 MED ORDER — OXYCODONE-ACETAMINOPHEN 5-325 MG PO TABS
5-325 MG | Freq: Once | ORAL | Status: AC
Start: 2010-11-13 — End: 2010-11-13
  Administered 2010-11-13: 14:00:00 via ORAL

## 2010-11-13 MED ORDER — OXYCODONE-ACETAMINOPHEN 5-325 MG PO TABS
5-325 MG | ORAL_TABLET | ORAL | Status: DC | PRN
Start: 2010-11-13 — End: 2010-12-06

## 2010-11-13 MED FILL — PERCOCET 5-325 MG PO TABS: 5-325 MG | ORAL | Qty: 2

## 2010-11-13 NOTE — ED Provider Notes (Signed)
HPI  30 year old female with history of a high cervical fusion and presented today with the chief complaint of neck pain and headache. Patient states she had worsening of her chronic pain that started approximately midnight last night. She states that nothing alleviates or exacerbates the pain it is located at the base of her skull posteriorly and has pain radiating to the top of her scalp. Patient states she frequently has exacerbations of her pain when the weather changes. There are no unusual symptoms for her at this time. There is no numbness, tingling, paresthesias, fevers, any recent signs or symptoms of infection. Patient states she was seen by her neurologist at [facility] on Wednesday and was started on nortriptyline. She was also sent a referral to a pain management doctor but has not been able to see him. Patient has tried over-the-counter NSAIDs this week that are not helping. Patient had a recent MRI done in December for a referral to [facility].  Review of Systems   Constitutional: Negative for fever and chills.   HENT: Positive for neck pain. Negative for congestion, sore throat, rhinorrhea and neck stiffness.    Respiratory: Negative for cough.    Gastrointestinal: Negative for abdominal pain.   Neurological: Positive for headaches. Negative for weakness and numbness.   All other systems reviewed and are negative.        Physical Exam   Constitutional: She is oriented to person, place, and time. She appears well-developed and well-nourished. No distress.   HENT:   Head: Normocephalic and atraumatic.   Mouth/Throat: Oropharynx is clear and moist.   Eyes: EOM are normal. Pupils are equal, round, and reactive to light.   Neck:        Patient has some limited range of motion secondary to pain. There is tenderness to palpation along the posterior occipital scalp line or the paraspinous muscles attach. There is no midline C-spine tenderness.   Cardiovascular: Normal rate, regular rhythm, normal  heart sounds and intact distal pulses.    No murmur heard.  Pulmonary/Chest: Effort normal and breath sounds normal. No respiratory distress. She has no wheezes.   Abdominal: Soft. She exhibits no distension. No tenderness.   Musculoskeletal: Normal range of motion. She exhibits no edema and no tenderness.   Lymphadenopathy:     She has no cervical adenopathy.   Neurological: She is alert and oriented to person, place, and time. She has normal reflexes. No cranial nerve deficit. Coordination normal.        Patient has 5 out of 5 strength in the bilateral upper and lower extremity. Sensory exam grossly intact.   Skin: Skin is warm and dry. She is not diaphoretic.   Psychiatric: She has a normal mood and affect.       Procedures    MDM  Patient was examined myself and the attending physician. After history and physical exam he does appear this is likely acute on chronic neck pain with this patient. I did evaluate patient's previous chart and imaging which does show a recent MRI findings consistent with previous spinal fusion surgery. Patient was evaluated by a neurologist on Wednesday at the [facility] I do believe she does have referral to a pain management clinic. Patient has good judgment and seems a reasonable I did provide her to Percocet tablets by mouth for pain control while in the emergency department. There are no red flags on patient's history or physical exam that make me concerned 4 any further imaging or  laboratory tests are necessary at this time. Patient has an afocal neurologic exam and presents with symptoms very familiar to her.    Clinical impression is acute on chronic neck pain    She is to followup with her physician this afternoon as scheduled. She is to follow up with her pain management physician as scheduled. Patient is not currently on a pain contract she is starting with a new specialist.patient is to return to the emergency department immediately if she develops any fevers, weakness,  paresthesias, numbness or has any other concerns or emergencies. She'll be provided a short course of Percocet for pain control over the next couple of days.  Labs      Radiology  The patient's MRI was reviewed from December which shows postoperative changes in the upper cervical spine with fusion between occiput and C2 by means of metal plate and screws.     EKG Interpretation      Carlus Pavlov, MD  Resident  11/13/10 0454    Berneda Rose, MD  11/13/10 [phone]

## 2010-11-13 NOTE — ED Provider Notes (Signed)
This patient was seen by the resident.?? I have seen and examined the patient, agree with the workup, evaluation, management and diagnosis.  Care plan has been discussed.      Berneda Rose, MD  11/13/10 [phone]

## 2010-11-13 NOTE — Discharge Instructions (Signed)
-  Follow up with your doctor this afternoon as scheduled.  -Would like you to follow up with your neurologist if the pain worsens or is uncontrollable for further management or until you can get into your pain management specialist.  -Do not drink or drive while taking pain medication, it will make you drowsy.  I suggest taking stool softeners while you are on them.  -Return immediately to ED if you develop fevers, numbness, tingling, difficulty walking, visual changes, or you have other concerns or emergencies.

## 2010-12-06 ENCOUNTER — Inpatient Hospital Stay
Admit: 2010-12-06 | Discharge: 2010-12-06 | Disposition: A | Discharge status: Home / Self Care | Attending: Emergency Medicine

## 2010-12-06 MED ORDER — DEXAMETHASONE SODIUM PHOSPHATE 4 MG/ML IJ SOLN
4 MG/ML | Freq: Once | INTRAMUSCULAR | Status: AC
Start: 2010-12-06 — End: 2010-12-06
  Administered 2010-12-06: 06:00:00 via INTRAMUSCULAR

## 2010-12-06 MED FILL — DEXAMETHASONE SODIUM PHOSPHATE 4 MG/ML IJ SOLN: 4 MG/ML | INTRAMUSCULAR | Qty: 5

## 2010-12-06 NOTE — Discharge Instructions (Signed)
Back Pain - Chronic  Back pain seldom lasts longer than 3 months. Long standing back pain can be caused by a ruptured disc. About half the time the exact cause cannot be found. Arthritis, osteoporosis, tumors, infections, previous back surgery, and other causes may be involved. Anxiety and depression are common factors.  A ruptured disc often causes sciatica. This is a pain traveling from the low back down the back of the leg. This is due to irritation of the sciatic nerve. Weakness or numbness in the legs or feet and loss of normal bladder control are more serious signs of disc rupture. You should contact your doctor immediately if these symptoms develop.  Avoid bending, heavy lifting, prolonged sitting, and activities which make the problem worse. Continue normal activity as much as possible. Take brief periods of rest throughout the day to reduce your pain during bad periods. A back exercise rehabilitation program can be helpful in reducing symptoms and preventing more pain. Muscle relaxants are sometimes used. Using narcotic pain medicine for long term pain is discouraged. Addiction is a possible outcome.    Surgery is considered only when symptoms do not improve with bed rest and other conservative treatment. You should see your caregiver if problems get worse.  SEEK IMMEDIATE MEDICAL CARE IF:   You have marked weakness or numbness in one of your legs.    You have trouble controlling your bladder or bowels.    You develop nausea, vomiting, abdominal pain, shortness of breath or fainting.    You have severe pain not relieved with medications.   Document Released: 11/25/2004 Document Re-Released: 01/14/2009  [employer] Patient Information 2011 [employer].

## 2010-12-06 NOTE — ED Notes (Signed)
Discharge instructions given pt dc/d to home with husband    Smitty Knudsen, [place]  12/06/10 [phone]

## 2010-12-06 NOTE — ED Provider Notes (Signed)
This patient was seen by the mid-level provider.  I have seen and examined the patient, agree with the workup, evaluation, management and diagnosis.  Care plan has been discussed.  An OARRS report was run on this patient to facilitate her care.    Durward Fortes, MD  12/06/10 [phone]

## 2010-12-06 NOTE — ED Provider Notes (Signed)
Chief complaint: Back pain    HPI This is a 30 year old Caucasian female with a history of depression, anxiety and back pain that has been persistent for several months. She delivered her baby in October and states her low back pain started while she was pregnant. She complains of a constant aching pain that is worsened with movement, specifically when she bends over and when she stands up from a sitting position. Her pain has worsened over the last 3 weeks. She reports radiation down the bilateral posterior thighs. She denies paresthesias, numbness, coldness or weakness in the extremities. Denies bowel or bladder incontinence, urine retention or constipation. She has had no fever, chills, nausea or vomiting. She otherwise has no complaints. She denies recent trauma or change in her normal activity other than carrying her baby around in its car seat. She is followed by Dr. Welton Flakes who prescribed her 32 Vicodin on January 24. She takes one every 12 hours as prescribed and has not taken it more frequently for fear of getting in trouble for violating her pain contract. She also takes naproxen and Zanaflex with minimal improvement. She is not scheduled to follow up with him until February 21. She had an MRI yesterday showing mild degenerative disc disease with no definite neural impingement. At L3-L4 there are tiny bilateral foraminal disc herniations. At L4-L5 there is mild circumferential disc bulge with tiny bilateral foraminal disc herniations and mild bilateral facet arthropathy. At L5-S1 there is minimal diffuse disc bulging.     History reviewed. No pertinent family history.    Review of Systems   Constitutional: Negative for fever and chills.   Respiratory: Negative for chest tightness and shortness of breath.    Gastrointestinal: Negative for nausea, vomiting and abdominal pain.   Genitourinary: Negative for dysuria, urgency, frequency and hematuria.   Musculoskeletal: Positive for back pain. Negative for  myalgias, arthralgias and gait problem.   Skin: Negative for rash.   Neurological: Negative for weakness and numbness.   All other systems reviewed and are negative.        Physical Exam   Nursing note and vitals reviewed.  Constitutional: She is oriented to person, place, and time. She appears well-developed and well-nourished. No distress.        The patient is lying supine on the stretcher slightly on her left side.    HENT:   Head: Normocephalic and atraumatic.   Right Ear: External ear normal.   Left Ear: External ear normal.   Nose: Nose normal.   Mouth/Throat: Mucous membranes are normal.   Eyes: Conjunctivae and EOM are normal. Right eye exhibits no discharge. Left eye exhibits no discharge.   Neck: Normal range of motion. Neck supple.   Cardiovascular: Normal rate and regular rhythm.  Exam reveals no gallop and no friction rub.    No murmur heard.  Pulmonary/Chest: Effort normal and breath sounds normal. No respiratory distress. She has no wheezes. She has no rhonchi. She has no rales.   Abdominal: Soft. She exhibits no distension. No tenderness.   Musculoskeletal:        Lumbar back: She exhibits decreased range of motion and tenderness. She exhibits no swelling, no deformity and no spasm.        The patient has approximately 50% flexion and extension of the low back limited by pain. She has good range of motion in all 4 extremities and ambulates slowly and stiffly but without need for assistance. The patient has mild tenderness to the lower  lumbar paraspinous musculature and over the sacrum.   Neurological: She is alert and oriented to person, place, and time. She has normal strength. No sensory deficit. Gait normal.   Reflex Scores:       Patellar reflexes are 2+ on the right side and 2+ on the left side.       Achilles reflexes are 2+ on the right side and 2+ on the left side.       Negative straight leg raise, no saddle anesthesia.   Skin: Skin is warm, dry and intact. No lesion, no petechiae and no  rash noted.   Psychiatric: She has a normal mood and affect. Her behavior is normal.       Procedures    MDM    MEDICAL DECISION MAKING    Vitals:    Filed Vitals:    12/06/10 0019   BP: 95/49   Pulse: 70   Temp: 97 ??F (36.1 ??C)   TempSrc: Oral   Resp: 16   Height: 5\' 5"  (1.651 m)   Weight: 139 lb (63.05 kg)   SpO2: 99%       MEDICAL DECISION MAKING / ED COURSE:      I discussed possible courses of treatment with the patient. She is already taking naproxen, Zanaflex and Vicodin. I offered her a steroid injection or pills. She opted for the injection and was therefore was given Decadron 10 mg IM here in the department. She takes Valium as well for her anxiety. She is informed that she may take the Vicodin more frequently, 2 pills every 6 hours maximum. She is encouraged however to discuss this with Dr. Welton Flakes prior to doing so. Other than the steroids, I do not have much else to offer at this time as she is already taking the appropriate medications. She will follow up with Dr. Welton Flakes for further recommendations. She recently had an MRI showing mild degenerative disc disease. I do not feel that further imaging is indicated at this time. She has no neurologic deficits on exam, I feel she is stable for outpatient follow-up and will continue her medications as prescribed.     The patient tolerated their visit well.  They were seen and evaluated by the attending physician who agreed with the assessment and plan.  The patient and / or the family were informed of the results of any tests, a time was given to answer questions, a plan was proposed and they agreed with plan.      DISCHARGE DIAGNOSIS:  1. Chronic low back pain        DISCHARGE MEDICATIONS:  New Prescriptions    No medications on file       (Please note the MDM and HPI sections of this note were completed with a voice recognition program.  Efforts were made to edit the dictations but occasionally words are mis-transcribed.Willette Pa, PA  12/06/10  0104    Durward Fortes, MD  12/06/10 [phone]

## 2010-12-17 ENCOUNTER — Inpatient Hospital Stay
Admit: 2010-12-17 | Discharge: 2010-12-17 | Disposition: A | Discharge status: Home / Self Care | Attending: Emergency Medicine

## 2010-12-17 MED ORDER — KETOROLAC TROMETHAMINE 30 MG/ML IJ SOLN
30 MG/ML | Freq: Once | INTRAMUSCULAR | Status: DC
Start: 2010-12-17 — End: 2010-12-17

## 2010-12-17 MED ORDER — KETOROLAC TROMETHAMINE 60 MG/2ML IJ SOLN
60 MG/2ML | INTRAMUSCULAR | Status: DC
Start: 2010-12-17 — End: 2010-12-17

## 2010-12-17 MED FILL — KETOROLAC TROMETHAMINE 30 MG/ML IJ SOLN: 30 MG/ML | INTRAMUSCULAR | Qty: 2

## 2010-12-17 MED FILL — KETOROLAC TROMETHAMINE 60 MG/2ML IJ SOLN: 60 MG/2ML | INTRAMUSCULAR | Qty: 2

## 2010-12-17 NOTE — Unmapped (Signed)
Signed by   [employer] on 12/18/2010 at 02:06:25  Patient: Anne Goodwin  Note: All result statuses are Final unless otherwise noted.    Tests: (1)  (MR)    Order Note:                                         THE [facility]     PATIENT NAME:   Anne Goodwin, Anne Goodwin             MR #:  [id number]  DATE OF BIRTH:  1980-12-23                        ACCOUNT #:  [id number]  ED PHYSICIAN:   Donell Beers. Norlene Duel, M.D.          ROOM #:  PRIMARY:        Selected Referral Pt              NURSING UNIT:  ED  REFERRING:      Selected Referral Pt              FC:  D  DICTATED BY:    Casimiro Needle T. Steuerwald, M.D.       ADMIT DATE:  12/17/2010  VISIT DATE:     12/17/2010                        DISCHARGE DATE:                               EMERGENCY DEPARTMENT NOTE     *-*-*     CHIEF COMPLAINT:  Neck pain.     HISTORY OF PRESENT ILLNESS:  This is a 30 year old female with a history of a  C1-C2 fusion in the early 2000s.  She has had decreased range of motion in  her neck ever since.  This was at the [facility].  She has had some  worsening neck pain from her chronic level for the last several months.  She  saw her spine surgeon recently and he had MRIs done of her head, neck and  lower spine.  She followed up with that person and that person referred her  to a pain doctor.  She is going to see that person this upcoming Tuesday.  She has not had any acute worsening.  She presents to the emergency  department today because she is just wanting some help to bridge herself  until she can see this pain doctor on Tuesday.  She has not had any fevers.  She has been eating and drinking fine.  She has been voiding and having bowel  movements.  She denies any incontinence.  She does have neck pain, but denies  any new trauma.  She does have a mild headache secondary to the pain that has  been present.  She says that none of these symptoms are new for her in any  way.     PAST MEDICAL HISTORY:     1.  Depression.  2.  C1-C2  fusion.     ALLERGIES:     1.  Keflex.  2.  Latex.  3.  Flagyl.     MEDICATIONS:     1.  Valium.  2.  Naproxen.     REVIEW  OF SYSTEMS:  A comprehensive 10-system review of systems was  performed.  All positive findings per the history of present illness.  All  other systems reviewed were found to be negative.     SOCIAL HISTORY:  Denies alcohol, tobacco or drug use.     PHYSICAL EXAMINATION:     VITAL SIGNS:  Temperature 97.8, pulse rate 94, blood pressure 118/70,  respiratory rate 16, 98% on room air.  GENERAL:  The patient is alert and oriented x4.  HEENT:  No redness or drainage from the eyes or drainage from the nose.  Oropharynx appears normal.  NECK:  The patient has a well-healed surgical scar on the posterior surface  of the neck.  The patient can range from right to left.  It would be  decreased for a normal person, but she says that this is her chronic level.  There is no tenderness to palpation in the anterior neck.  CHEST:  Nontender to palpation.  RESPIRATORY:  Clear to auscultation bilaterally.  BACK:  Not tender to palpation.  No CVA tenderness.  Spine is nontender to  palpation.  ABDOMEN:  Belly is soft.  SKIN:  No rash.  NEUROLOGIC:  Alert and oriented x4, moving all extremities.  No gross sensory  deficit.  The patient is able to ambulate around the pod without any  difficulty.  EXTREMITIES:  No tenderness to palpation of any of the long bones.  Again,  the patient can ambulate around the pod without any difficulty.     EMERGENCY DEPARTMENT COURSE/MEDICAL DECISION MAKING:  The patient presented  to the emergency department at [facility] on 12/17/2010, here,  evaluated by me, also evaluated by the attending.  Our care plan was  discussed and agreed upon.     On arrival in the emergency department, the patient was made comfortable in  room 13.  History and physical exam were performed by myself after nursing  assessment.     Given the above history and physical exam findings, I believe that  the  entirety of the patient's symptoms are chronic.  She had a large workup done  by her spine surgeon recently and has had no new symptoms or worsening of her  symptoms since then.  She is going to see a pain specialist this upcoming  Tuesday.  Her physical exam is reassuring at this time.  She is ambulatory.  I think that it is reasonable to provide the patient with a prescription for  oxycodone 5 mg one tablet by mouth every six hours as needed for pain,  dispense number 30 with zero refills, and have her follow up with her pain  specialist on Tuesday.  At that point in time, her long-term therapies will  become that person's responsibility.  I think that it is reasonable to bridge  her with this therapy until then, so that she can have some relief, and then  have her follow up with the rest of her providers.  I have asked her to  return to the emergency department for any worsening or new symptoms.  She is  going to be following up as aforementioned.     IMPRESSION:     1.  Neck pain, chronic.     PLAN:  As described above.     CONDITION:  Good.     DISPOSITION:  Home.        *-*-*  _______________________________________  MTS/wls                                _____  D:  12/17/2010 15:58                  Casimiro Needle T. Rudene Anda, M.D.  T:  12/18/2010 01:56  Job #:  [id number]                                        _______________________________________                                         _____                                        Donell Beers. Norlene Duel, M.D.                                   EMERGENCY DEPARTMENT NOTE                                                               PAGE    1 of   1    Note: An exclamation mark (!) indicates a result that was not dispersed into   the flowsheet.  Document Creation Date: 12/18/2010 2:06 AM  _______________________________________________________________________    (1) Order result status: Final  Collection or observation  date-time: 12/17/2010 00:00  Requested date-time:   Receipt date-time:   Reported date-time:   Referring Physician: Selected Pt  Ordering Physician:  Reviewed In [facility])  Specimen Source:   Source: DBS  Filler Order Number: [id number]  Lab site:

## 2010-12-17 NOTE — ED Notes (Signed)
Patient very upset upon discharge.  Patient refused toradol and left the ER.     Reita Cliche, RN  12/17/10 1326

## 2010-12-17 NOTE — ED Notes (Signed)
Patient representative at bedside.     Reita Cliche, RN  12/17/10 1233

## 2010-12-17 NOTE — ED Notes (Signed)
30 yo wf to ed states she ran out of her vicodin which she takes for chronic pain and that her pain management doctor will not see her or refill her med until her next appointment on 2/22. She sees Dr. Aaron Mose for pain management. She states she doesn't want to "get into trouble" for coming here.    Gordy Levan, RN  12/17/10 [phone]

## 2010-12-17 NOTE — Discharge Instructions (Signed)
Cervical and Neck Sprain and Strain  (Neck Sprain and Strain)  A cervical sprain is an injury to the neck. The injury can include either over-stretching or even small tears in the ligaments that hold the bones of the neck in place. A strain affects muscles and tendons. Minor injuries usually only involve ligaments and muscles. Because the different parts of the neck are so close together, more severe injuries can involve both sprain and strain. These injuries can affect the muscles, ligaments, tendons, discs, and nerves in the neck.  SYMPTOMS   Pain, soreness, stiffness, or burning sensation in the front, back, or sides of the neck. This may develop immediately after injury. Onset of discomfort may also develop slowly and not begin for 24 hours or more.    Shoulder and/or upper back pain.    Limits to the normal movement of the neck.    Headache.    Dizziness.    Weakness and/or abnormal sensation (such as numbness or tingling) of one or both arms and/or hands.    Muscle spasm.    Difficulty with swallowing or chewing.    Tenderness and swelling at the injury site.   CAUSES  An injury may be the result of a direct blow or from certain habits that can lead to the symptoms noted above.   Injury from:    Contact sports (such as football, rugby, wrestling, hockey, auto racing, gymnastics, diving, martial arts, and boxing).    Motor vehicle accidents.    Whiplash injuries (see image at right). These are common. They occur when the neck is forcefully whipped or forced backward and/or forward.    Falls.    Lifestyle or awkward postures:    Cradling a telephone between the ear and shoulder.    Sitting in a chair that offers no support.    Working at an ill-designed computer station.    Activities that require hours of repeated or long periods of looking up (stretching the neck backward) or looking down (bending the head/neck forward).   DIAGNOSIS   Most of the time, your caregiver can diagnose this  problem with a careful history and examination. The history will include information about known problems (such as arthritis in the neck) or a previous neck injury. X-rays may be ordered to find out if there is a different problem. X-rays can also help to find problems with the bones of the neck not related to the injury or current symptoms.  TREATMENT  Several treatment options are available to help pain, spasm, and other symptoms. They include:   Cold helps relieve pain and reduce inflammation. Cold should be applied for 10 to 15 minutes every 2 to 3 hours after any activity that aggravates your symptoms. Use ice packs or an ice massage. Place a towel or cloth in between your skin and the ice pack.    Medication:    Only take over-the-counter or prescription medicines for pain, discomfort, or fever as directed by your caregiver.    Pain relievers or muscle relaxants may be prescribed. Use only as directed and only as much as you need.    Change in the activity that caused the problem. This might include using a headset with a telephone so that the phone is not propped between your ear and shoulder.    Neck collar. Your caregiver may recommend temporary use of a soft cervical collar.    Work station. Changes may be needed in your work place. A better sitting position   and/or better posture during work may be part of your treatment.    Physical Therapy. Your caregiver may recommend physical therapy. This can include instructions in the use of stretching and strengthening exercises. Improvement in posture is important. Exercises and posture training can help stabilize the neck and strengthen muscles and keep symptoms from returning.   HOME CARE INSTRUCTIONS   Other than formal physical therapy, all treatments above can be done at home. Even when not at work, it is important to be conscious of your posture and of activities that can cause a return of symptoms.  Most cervical sprains and/or strains are better in  1-3 weeks. As you improve and increase activities, doing a warm up and stretching before the activity will help prevent recurrent problems.  SEEK MEDICAL CARE IF:    Pain is not effectively controlled with medication.    You feel unable to decrease pain medication over time as planned.    Activity level is not improving as planned and/or expected.   SEEK IMMEDIATE MEDICAL CARE IF:    While using medication, you develop any bleeding, stomach upset, or signs of an allergic reaction.    Symptoms get worse, become intolerable, and are not helped by medications.    New, unexplained symptoms develop.    You experience numbness, tingling, weakness, or paralysis of any part of your body.   MAKE SURE YOU:    Understand these instructions.    Will watch your condition.    Will get help right away if you are not doing well or get worse.   Document Released: 08/15/2007 Document Re-Released: 01/14/2009  [employer] Patient Information 2011 [employer].

## 2010-12-17 NOTE — ED Notes (Signed)
Patient refused Toradol injection. I told the patient that she was discharged and that we could let her go and the patient stated that she wanted to talk to a patient representative.  I called Tawni Levy and she stated that she would come down to talk to the patient.     Reita Cliche, RN  12/17/10 [phone]

## 2010-12-17 NOTE — ED Provider Notes (Signed)
HPI  Chief Complaint:    Chief Complaint   Patient presents with   ??? Medication Refill       Nursing Notes, Past Medical Hx, Past Surgical Hx, Social Hx, Allergies, and Family Hx were all reviewed and agreed with or any disagreements were addressed in the HPI.    HPI:    [name] [name] is a 30 y.o. female Complaining of right-sided neck pain. This is a chronic issue for her. She states that she has run out of her Vicodin and needs more. She denies any weakness or numbness to her arm or leg. She does see a pain specialist for pain management. She denies any new trauma.    Review of Systems   Constitutional: Negative for fever and chills.   HENT: Positive for neck pain. Negative for hearing loss, ear pain, nosebleeds, congestion, sore throat, rhinorrhea, trouble swallowing, neck stiffness and tinnitus.    Respiratory: Negative for cough, chest tightness and shortness of breath.    Cardiovascular: Negative for chest pain.   Gastrointestinal: Negative for nausea, vomiting and abdominal pain.   Genitourinary: Negative for dysuria, urgency and difficulty urinating.   Musculoskeletal: Negative for back pain.   Neurological: Negative for dizziness, weakness and numbness.   All other systems reviewed and are negative.        Physical Exam   Nursing note and vitals reviewed.  Constitutional: She is oriented to person, place, and time. She appears well-developed and well-nourished. No distress.   HENT:   Head: Normocephalic and atraumatic.   Right Ear: External ear normal.   Left Ear: External ear normal.   Nose: Nose normal.   Eyes: EOM are normal. Pupils are equal, round, and reactive to light. Right eye exhibits no discharge. Left eye exhibits no discharge.   Neck: Normal range of motion. Neck supple.   Cardiovascular: Normal rate, regular rhythm and normal heart sounds.    Pulmonary/Chest: Effort normal and breath sounds normal. No respiratory distress. She has no wheezes.   Abdominal: Soft. Bowel sounds are  normal. No tenderness.   Musculoskeletal: Normal range of motion.   Neurological: She is alert and oriented to person, place, and time. No cranial nerve deficit.   Skin: Skin is warm and dry.   Psychiatric: She has a normal mood and affect. Her behavior is normal.       Procedures    MDM    MEDICAL DECISION MAKING    Vitals:  BP 135/65   Pulse 79   Temp 97 ??F (36.1 ??C)   Resp 14   Ht 5\' 5"  (1.651 m)   Wt 136 lb (61.689 kg)   BMI 22.63 kg/m2   SpO2 98%   LMP 12/02/2010    Labs:  Labs Reviewed - No data to display    Remainder of labs reviewed and were negative at this time.    EKG:       X-RAYS:        PROCEDURES: N/A    CRITICAL CARE:  Not applicable    CONSULTATIONS: N/A    MEDICAL DECISION MAKING / ED COURSE:    This is a chronic pain for this patient. She has a normal neurologic examination. I feel that the best treatment for this patient would not be to continue refilling narcotic medicine from the ED, as has been done multiple times in the past. She has a pain specialist and I explained to her that the best course of action is for her to  follow very closely with her pain specialist for further medication and treatment. I did offer her a Toradol injection. I encouraged her to continue her anti-inflammatory pain medicine as an outpatient. She is not happy with this plan despite my best attempts to explain a proper course of treatment.    The patient tolerated their visit well.  The patient and / or the family were informed of the results of any tests, a time was given to answer questions, a plan was proposed and they agreed with plan.      DISCHARGE DIAGNOSIS:  1. Neck pain, chronic        (Please note the MDM and HPI sections of this note were completed with a voice recognition program.  Efforts were made to edit the dictations but occasionally words are mis-transcribed.)        Fenton Malling, MD  12/17/10 2008

## 2010-12-18 NOTE — Unmapped (Signed)
THE [facility]     PATIENT NAME:   Anne Goodwin, Anne Goodwin             MR #:  [id number]   DATE OF BIRTH:  05-Apr-1981                        ACCOUNT #:  [id number]   ED PHYSICIAN:   Donell Beers. Norlene Duel, M.D.          ROOM #:   PRIMARY:        Selected Referral Pt              NURSING UNIT:  ED   REFERRING:      Selected Referral Pt              FC:  D   DICTATED BY:    Casimiro Needle T. Jonanthony Nahar, M.D.       ADMIT DATE:  12/17/2010   VISIT DATE:     12/17/2010                        DISCHARGE DATE:                               EMERGENCY DEPARTMENT NOTE     *-*-*     CHIEF COMPLAINT:  Neck pain.     HISTORY OF PRESENT ILLNESS:  This is a 30 year old female with a history of a   C1-C2 fusion in the early 2000s.  She has had decreased range of motion in   her neck ever since.  This was at the [facility].  She has had some   worsening neck pain from her chronic level for the last several months.  She   saw her spine surgeon recently and he had MRIs done of her head, neck and   lower spine.  She followed up with that person and that person referred her   to a pain doctor.  She is going to see that person this upcoming Tuesday.   She has not had any acute worsening.  She presents to the emergency   department today because she is just wanting some help to bridge herself   until she can see this pain doctor on Tuesday.  She has not had any fevers.   She has been eating and drinking fine.  She has been voiding and having bowel   movements.  She denies any incontinence.  She does have neck pain, but denies   any new trauma.  She does have a mild headache secondary to the pain that has   been present.  She says that none of these symptoms are new for her in any   way.     PAST MEDICAL HISTORY:     1.  Depression.   2.  C1-C2 fusion.     ALLERGIES:     1.  Keflex.   2.  Latex.   3.  Flagyl.     MEDICATIONS:     1.  Valium.   2.  Naproxen.     REVIEW OF SYSTEMS:  A comprehensive 10-system review  of systems was   performed.  All positive findings per the history of present illness.  All   other systems reviewed were found to be negative.     SOCIAL HISTORY:  Denies alcohol, tobacco or drug use.  PHYSICAL EXAMINATION:     VITAL SIGNS:  Temperature 97.8, pulse rate 94, blood pressure 118/70,   respiratory rate 16, 98% on room air.   GENERAL:  The patient is alert and oriented x4.   HEENT:  No redness or drainage from the eyes or drainage from the nose.   Oropharynx appears normal.   NECK:  The patient has a well-healed surgical scar on the posterior surface   of the neck.  The patient can range from right to left.  It would be   decreased for a normal person, but she says that this is her chronic level.   There is no tenderness to palpation in the anterior neck.   CHEST:  Nontender to palpation.   RESPIRATORY:  Clear to auscultation bilaterally.   BACK:  Not tender to palpation.  No CVA tenderness.  Spine is nontender to   palpation.   ABDOMEN:  Belly is soft.   SKIN:  No rash.   NEUROLOGIC:  Alert and oriented x4, moving all extremities.  No gross sensory   deficit.  The patient is able to ambulate around the pod without any   difficulty.   EXTREMITIES:  No tenderness to palpation of any of the long bones.  Again,   the patient can ambulate around the pod without any difficulty.     EMERGENCY DEPARTMENT COURSE/MEDICAL DECISION MAKING:  The patient presented   to the emergency department at [facility] on 12/17/2010, here,   evaluated by me, also evaluated by the attending.  Our care plan was   discussed and agreed upon.     On arrival in the emergency department, the patient was made comfortable in   room 13.  History and physical exam were performed by myself after nursing   assessment.     Given the above history and physical exam findings, I believe that the   entirety of the patient's symptoms are chronic.  She had a large workup done   by her spine surgeon recently and has had no new symptoms  or worsening of her   symptoms since then.  She is going to see a pain specialist this upcoming   Tuesday.  Her physical exam is reassuring at this time.  She is ambulatory.   I think that it is reasonable to provide the patient with a prescription for   oxycodone 5 mg one tablet by mouth every six hours as needed for pain,   dispense number 30 with zero refills, and have her follow up with her pain   specialist on Tuesday.  At that point in time, her long-term therapies will   become that person's responsibility.  I think that it is reasonable to bridge   her with this therapy until then, so that she can have some relief, and then   have her follow up with the rest of her providers.  I have asked her to   return to the emergency department for any worsening or new symptoms.  She is   going to be following up as aforementioned.     IMPRESSION:     1.  Neck pain, chronic.     PLAN:  As described above.     CONDITION:  Good.     DISPOSITION:  Home.       *-*-*  _______________________________________   MTS/wls                                _____   D:  12/17/2010 15:58                  Casimiro Needle T. Rudene Anda, M.D.   T:  12/18/2010 01:56   Job #:  [id number]                                         _______________________________________                                          _____                                         Donell Beers. Norlene Duel, M.D.                                  EMERGENCY DEPARTMENT NOTE                                                                PAGE    1 of   1
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
## 2011-01-10 NOTE — ED Notes (Signed)
Pt left. Not in room, not in bathroom.    Gaston Islam, RN  01/10/11 2224

## 2011-01-10 NOTE — ED Provider Notes (Signed)
I have personally seen and examined the patient.  I have discussed the patient with mid-level provider and agree with the assessment and plan as documented by the mid-level provider.  Laceration to index finger.  Neurovascularly intact.  Repaired by PA-C - I was present and available during procedure.  Td up to date.  Plan have follow up for suture removal with PMD..    Leanord Hawking, MD  01/11/11 [phone]

## 2011-01-10 NOTE — ED Notes (Signed)
Pt discharged to home in nad. D/c instructions given. Pt verbalized understanding. rx given      Gaston Islam, RN  01/10/11 2146

## 2011-01-10 NOTE — ED Notes (Signed)
Pt requesting to speak with PA regarding need for stronger pain meds. Chelsea PA-C aware.    Gaston Islam, RN  01/10/11 2156

## 2011-01-10 NOTE — ED Notes (Signed)
Pt instructed must stay in room until ride home arrives and rn aware. Pt verbalized understanding.    Gaston Islam, RN  01/10/11 2223

## 2011-01-10 NOTE — ED Provider Notes (Signed)
HPI  Chief Complaint:    Chief Complaint   Patient presents with   ??? Laceration     to left index finger from sharp knife        Nursing Notes, Past Medical Hx, Past Surgical Hx, Social Hx, Allergies, and Family Hx were all reviewed and agreed with or any disagreements were addressed in the HPI.    HPI:    [name] [name] is a 30 y.o. female who presents to the emergency department with the chief complaint of Left index finger laceration. Patient states she was washing a sharp knife and cut her left index finger.  Patient denies any decreased range of motion, radiation of pain, paresthesias or weakness of the left upper extremity. Last tetanus shot was approximately 3 years ago. Patient denies any notice or tingling. Patient is right-hand dominant.    Review of Systems   Constitutional: Negative for fever.   Skin: Positive for wound.   Neurological: Negative for numbness.       Physical Exam   [nursing notereviewed.  Constitutional: She appears well-developed and well-nourished. No distress.   Musculoskeletal: Normal range of motion. She exhibits tenderness. She exhibits no edema.        Left hand: She exhibits tenderness and laceration. She exhibits normal range of motion, no bony tenderness, normal two-point discrimination, normal capillary refill, no deformity and no swelling. normal sensation noted. Decreased sensation is not present in the ulnar distribution, is not present in the medial redistribution and is not present in the radial distribution. Normal strength noted. She exhibits no finger abduction, no thumb/finger opposition and no wrist extension trouble.        Hands:       2 cm laceration noted to the left index finger on the palmar surface at the middle phalanx. Patient has full flexion and extension of the DIP joint, PIP, and MCP joint of the left index finger. 2+ radial pulse the left upper extremity.   Neurological: She has normal strength and normal reflexes. No sensory deficit.   Skin: She  is not diaphoretic.       Procedures    MDM    MEDICAL DECISION MAKING    Vitals:  BP 126/84   Pulse 102   Temp(Src) 97.9 ??F (36.6 ??C) (Oral)   Resp 18   Ht 5\' 5"  (1.651 m)   Wt 135 lb (61.236 kg)   BMI 22.47 kg/m2   SpO2 95%   LMP 01/09/2011   Breastfeeding? No    PROCEDURES: Laceration Repair Procedure Note      Indication: Laceration  Location: Left index finger  Procedure:  The patient was placed in the appropriate position and anesthesia around the laceration was obtained by infiltration using 1% lidocaine for digital block of the left index finger. The area was then irrigated with normal saline. No foreign bodies, no tendon involvement noted in the wound. The patient prepped and draped in a sterile fashion. The laceration was closed with 4.0 Ethilon simple interrupted sutures. There were no additional lacerations requiring repair. The wound area was then dressed with a bandage.      Total repaired wound length: 2 cm.     Other Items: Suture count:2    The patient tolerated the procedure well.    Complications: None    MEDICAL DECISION MAKING / ED COURSE:    This is a 30 year old female with a laceration to her left index finger from a sharp knife. Patient otherwise neurovascularly intact  left upper extremity. Sutures were placed in the laceration. Polysporin and dressing placed over the wound. Patient was placed in a finger splint for comfort. Patient is to have suture removal in one week. She was given Ibuprofen 800 for pain. Patient to return if any worsening symptoms or signs of infection. She was prescribed ibuprofen 800 to go home with for pain. At this point she is stable for discharge and to follow up with PCP in one week for  suture removal.    The patient tolerated their visit well.  The patient and / or the family were informed of the results of any tests, a time was given to answer questions, a plan was proposed and they agreed with plan.      Patient seen and examined by the attending physician  Leanord Hawking, MD who agrees with my assessment and treatment and plan.     DIAGNOSIS:  1. Laceration of left index finger without foreign body without damage to nail        (Please note the MDM and HPI sections of this note were completed with a voice recognition program.  Efforts were made to edit the dictations but occasionally words are mis-transcribed.)            Iona Beard, [place]  01/10/11 2138    Leanord Hawking, MD  01/11/11 [phone]

## 2011-01-10 NOTE — Discharge Instructions (Signed)
Laceration Care  A laceration is a cut or lesion that goes through all layers of the skin and into the tissue just beneath the skin.    BEFORE THE PROCEDURE  The laceration will be inspected by your caregiver for amount and extent of tissue damage, for bleeding, for evidence of foreign bodies (pieces of dirt, glass, etc.), and for cleanliness. Pain medications can be used if necessary. The wound will then be cleansed to help prevent infection. Sutures, staples or skin adhesive strips will be used to close the wound, stop bleeding and speed healing. Sometimes this will decrease the likelihood of infection and bleeding.  LET YOUR CAREGIVERS KNOW ABOUT THE FOLLOWING:   Allergies.   Medications taken including herbs, eye drops, over the counter medications, and creams.    Use of steroids (by mouth or creams).    Previous problems with anesthetics or novocaine.   Possibility of pregnancy, if this applies.   History of blood clots (thrombophlebitis).    History of bleeding or blood problems.    Previous surgery.    Other health problems.    RISKS AND COMPLICATIONS  Most lacerations heal fully. The healing time required varies depending on location. Complications of a laceration can include pain, bleeding, infection, dehiscence (splitting open or separation of the wound edges) and scar formation. The likelihood of complication depends on wound complexity, location, and on how the laceration occurred.  HOME CARE INSTRUCTIONS   If you were given a dressing, you should change it at least once a day, or as instructed by your caregiver. If the bandage sticks, soak it off with water.    Twice a day, wash the area with soap and water and rinse with plain water to remove all soap. Pat (do not rub) dry with a clean towel. Look for signs of infection (see below).    Re-apply prescribed creams or ointments as instructed. This will help prevent infection. This also helps keep the bandage from sticking.    If the bandage  becomes wet, dirty, or develops a foul smell, change it as soon as possible.    Only take over-the-counter or prescription medicines for pain, discomfort, or fever as directed by your doctor.    Have your sutures, staples or skin adhesive strips removed as instructed. Skin adhesive strips will peel off from the outer edges toward the center and will eventually fall off. Report to your caregiver if the strips are falling off and the wound does not appear fully healed.   SEEK MEDICAL CARE IF:   There is redness, swelling, or increasing pain in the wound.    There is a red line that goes up your arm or leg.    Pus is coming from wound.    You develop an unexplained oral temperature above 102 F (38.9 C), or as your caregiver suggests.    You notice a foul smell coming from the wound or dressing.    There is a breaking open of the wound (edges not staying together) before or after sutures have been removed.    You notice something coming out of the wound such as wood or glass.    The wound is on your hand or foot and you find that you are unable to properly move a finger or toe.    There is severe swelling around the wound causing pain and numbness or a change in color in your arm, hand, leg, or foot.   SEEK IMMEDIATE MEDICAL CARE IF:     Pain is not controlled with prescribed medication or with acetaminophen or ibuprofen.    There is severe swelling around the wound site.    The wound splits open and bleeding recurs.    You experience worsening numbness, weakness, or loss of function of any joint around or beyond the wound.    You develop painful lumps near the wound or on the skin anywhere on your body.   If you did not receive a tetanus shot today because you did not recall when your last one was given, make sure to check with your caregiver when you have your sutures removed to determine if you need one.  MAKE SURE YOU:    Understand these instructions.    Will watch your condition.    Will get  help right away if you are not doing well or get worse.   Document Released: 10/18/2005 Document Re-Released: 01/12/2010  [employer] Patient Information 2011 [employer].

## 2011-01-11 ENCOUNTER — Inpatient Hospital Stay
Admit: 2011-01-11 | Discharge: 2011-01-11 | Disposition: A | Discharge status: Home / Self Care | Attending: Emergency Medicine

## 2011-01-11 MED ORDER — IBUPROFEN 800 MG PO TABS
800 MG | ORAL_TABLET | Freq: Three times a day (TID) | ORAL | Status: DC | PRN
Start: 2011-01-11 — End: 2011-02-25

## 2011-01-11 MED ORDER — BACITRACIN-POLYMYXIN B 500-10000 UNIT/GM EX OINT
500-10000 UNIT/GM | Freq: Once | CUTANEOUS | Status: AC
Start: 2011-01-11 — End: 2011-01-10
  Administered 2011-01-11: 02:00:00 via TOPICAL

## 2011-01-11 MED ORDER — IBUPROFEN 800 MG PO TABS
800 MG | Freq: Once | ORAL | Status: AC
Start: 2011-01-11 — End: 2011-01-10
  Administered 2011-01-11: 02:00:00 via ORAL

## 2011-01-11 MED ORDER — LIDOCAINE HCL 1 % IJ SOLN
1 % | Freq: Once | INTRAMUSCULAR | Status: AC
Start: 2011-01-11 — End: 2011-01-10
  Administered 2011-01-11: 01:00:00 via INTRADERMAL

## 2011-01-11 MED ORDER — HYDROCODONE-ACETAMINOPHEN 5-500 MG PO TABS
5-500 MG | Freq: Once | ORAL | Status: AC
Start: 2011-01-11 — End: 2011-01-10
  Administered 2011-01-11: 02:00:00 via ORAL

## 2011-01-11 MED FILL — IBUPROFEN 800 MG PO TABS: 800 MG | ORAL | Qty: 1

## 2011-01-11 MED FILL — HYDROCODONE-ACETAMINOPHEN 5-500 MG PO TABS: 5-500 MG | ORAL | Qty: 1

## 2011-01-11 MED FILL — DOUBLE ANTIBIOTIC 500-10000 UNIT/GM EX OINT: 500-10000 UNIT/GM | CUTANEOUS | Qty: 1

## 2011-01-11 MED FILL — LIDOCAINE HCL 1 % IJ SOLN: 1 % | INTRAMUSCULAR | Qty: 20

## 2011-01-14 ENCOUNTER — Inpatient Hospital Stay
Admit: 2011-01-14 | Discharge: 2011-01-14 | Disposition: A | Discharge status: Home / Self Care | Attending: Emergency Medicine

## 2011-01-14 MED ORDER — HYDROCODONE-ACETAMINOPHEN 5-325 MG PO TABS
5-325 MG | ORAL_TABLET | Freq: Four times a day (QID) | ORAL | Status: AC | PRN
Start: 2011-01-14 — End: 2011-01-21

## 2011-01-14 MED ORDER — OXYCODONE-ACETAMINOPHEN 5-325 MG PO TABS
5-325 MG | Freq: Once | ORAL | Status: AC
Start: 2011-01-14 — End: 2011-01-14
  Administered 2011-01-14: 23:00:00 via ORAL

## 2011-01-14 MED ORDER — KETOROLAC TROMETHAMINE 30 MG/ML IJ SOLN
30 MG/ML | Freq: Once | INTRAMUSCULAR | Status: AC
Start: 2011-01-14 — End: 2011-01-14
  Administered 2011-01-14: 23:00:00 via INTRAMUSCULAR

## 2011-01-14 MED ORDER — IBUPROFEN 800 MG PO TABS
800 MG | ORAL_TABLET | Freq: Three times a day (TID) | ORAL | Status: DC | PRN
Start: 2011-01-14 — End: 2011-02-25

## 2011-01-14 MED FILL — PERCOCET 5-325 MG PO TABS: 5-325 MG | ORAL | Qty: 2

## 2011-01-14 MED FILL — KETOROLAC TROMETHAMINE 30 MG/ML IJ SOLN: 30 MG/ML | INTRAMUSCULAR | Qty: 2

## 2011-01-14 NOTE — ED Notes (Signed)
Pt is complaining of back pain that radiates to tail bone. Stated she see's pain doctor for chronic neck pain.     Glade Stanford, RN  01/14/11 1906

## 2011-01-14 NOTE — ED Notes (Signed)
Placed steri strip on left index finger after pt washed hands with soap and water. Pt has small healing laceration of finger that she was seen previously for. Pt is complaining more of a pressure in her back and rt leg inner thigh pain. She rates pain at 7/10 after pain meds    Glade Stanford, RN  01/14/11 1927

## 2011-01-14 NOTE — ED Provider Notes (Signed)
Chief Complaint:    Chief Complaint   Patient presents with   ??? Back Pain     low, herniated disc L3-5, radiates down leg       Nursing Notes, Past Medical Hx, Past Surgical Hx, Social Hx, Allergies, and Family Hx were all reviewed and agreed with or any disagreements were addressed in the HPI.    HPI:    [name] [name] is a 30 y.o. female Complains of acute on chronic low back pain. Patient is scheduled to see a pain specialist on March 25. She has appointment with [facility] for followup on a herniated disc in her lumbar spine. Patient states she had an MRI within the last few months which did show L3 L4-L5 herniated disc. Patient has had radicular pain down both lower extremities. Patient denies any weakness or paresthesias or bowel bladder incontinence. Denies any saddle paresthesias. Patient states the pain is a throbbing aching pain which is worse with movement. Patient is out of her Vicodin which she normally takes.  Denies any new injury or trauma to the back.    REVIEW OF SYSTEMS:  Patient denies any dizziness, lightheadedness, denies any headaches, blurred vision, photophobia or syncope. Patient denies any neck pain, or stiffness, fever chills, shortness of breath, chest pain, palpitations, or cough. Patient denies any hemoptysis. Denies any abdominal pain, nausea, vomiting, diarrhea. Patient denies any dysuria or Frequency, or urgency, or bowel or bladder  incontinence. Denies any paresthesias ,or weakness.  Review of systems otherwise negative per patient.    PHYSICAL EXAM:  General: patient is alert and oriented x3, no acute distress, nontoxic in appearance, ambulatory with a steady gait.  SKIN: warm and dry to touch, no lesions or rashes.  HEENT:  Normocephalic, atraumatic. PERRLA,  EOMI, Conjunctiva and sclera are clear, TMs are clear bilaterally, pharynx without erythema or exudate, Uvula midline, mucosa moist and pink, No sinus tenderness noted.  NECK:  Supple without lymphadenopathy,  trachea is midline. No JVD, bruits, or stridor noted.  No spinous process of the C-spine.  LUNGS:  Clear to auscultation without wheezes, rales, or rhonchi.  HEART:  Regular rate, regular rhythm, no murmurs, rubs, or gallops.  ABDOMEN:  Soft, nontender, nondistended.  No rebound, no guarding, no organomegaly or masses noted.  Positive bowel sounds all 4 quadrants.  BACK:  Negative CVA tenderness, no spinous process tenderness of the C-spine, T-spine, or L-spine.  Bilateral lumbar paraspinous muscle tenderness  EXTREMITIES:  2+ pulses in all extremities.  Full range of motion of all extremities. No ecchymosis, edema, erythema, or  warmth to touch.  NEUROLOGICAL:  Alert oriented x3,, cranial nerves II through XII intact. Deep tendon reflexes, sensory, motor intact. No acute focal deficits. Moving all extremities.  MEDICAL DECISION MAKING    Vitals:  BP 127/78   Pulse 81   Temp(Src) 98.6 ??F (37 ??C) (Oral)   Resp 16   Wt 130 lb (58.968 kg)   SpO2 98%   LMP 01/09/2011      MEDICAL DECISION MAKING / ED COURSE:    This is a 30 year old female complains of acute on chronic low back pain with radiation of pain down her bilateral lower extremities. Patient has followup on March 25 for pain management.patient has had a recent MRI was to show herniated disc in the lower lumbar spine. She is also scheduled to follow up with [facility]. She did receive Toradol and Percocet here. I will place patient on a ibuprofen and a few Norco  for pain. She is otherwise neurovascularly intact with no saddle paresthesias, no bowel bladder incontinence, no weakness of extremities. She can return if any worsening symptoms. Otherwise appears well and at this point stable for discharge.    The patient tolerated their visit well.  The patient and / or the family were informed of the results of any tests, a time was given to answer questions, a plan was proposed and they agreed with plan.      Patient seen and examined by the attending physician  Rollene Rotunda, MD who agrees with my assessment and treatment and plan.     DIAGNOSIS:  1. Low back pain radiating to both legs        (Please note the MDM and HPI sections of this note were completed with a voice recognition program.  Efforts were made to edit the dictations but occasionally words are mis-transcribed.)                  Iona Beard, PA  01/14/11 1928    Rollene Rotunda, MD  02/11/11 1126

## 2011-01-14 NOTE — Discharge Instructions (Signed)
Back Exercises  Back exercises help treat and prevent back injuries. The goal of back exercises is to increase the strength of your abdominal and back muscles and the flexibility of your back. These exercises should be started when you no longer have back pain. Back exercises include:  1. Pelvic Tilt - Lie on your back with your knees bent. Tilt your pelvis until the lower part of your back is against the floor. Hold this position 5-10 sec and repeat 5-10 times.   2. Knee to Chest - Pull first one knee up against your chest and hold for 20-30 seconds, repeat this with the other knee, and then both knees. This may be done with the other leg straight or bent, whichever feels better.   3. Sit-Ups or Curl-Ups - Bend your knees 90 degrees. Start with tilting your pelvis, and do a partial, slow sit-up, lifting your trunk only 30-45 degrees off the floor. Take at least 2-3 sec for each sit-up. Do not do sit-ups with your knees out straight. If partial sit-ups are difficult, simply do the above but with only tightening your abdominal muscles and holding it as directed.   4. Hip-Lift - Lie on your back with your knees flexed 90 degrees. Push down with your feet and shoulders as you raise your hips a couple inches off the floor; hold for 10 sec, repeat 5-10 times.   5. Back arches - Lie on your stomach, propping yourself up on bent elbows. Slowly press on your hands, causing an arch in your low back. Repeat 3-5 times. Any initial stiffness and discomfort should lessen with repetition over time.   6. Shoulder-Lifts - Lie face down with arms beside your body. Keep hips and torso pressed to floor as you slowly lift your head and shoulders off the floor.   Do not overdo your exercises, especially in the beginning. Exercises may cause you some mild back discomfort which lasts for a few minutes; however, if the pain is more severe, or lasts for more than 15 minutes, do not continue exercises until you see your caregiver.  Improvement with exercise therapy for back problems is slow.    See your caregivers for assistance with developing a proper back exercise program.  Document Released: 11/25/2004 Document Re-Released: 01/14/2009  [employer] Patient Information 2011 [employer].    Back Pain - Chronic  Back pain seldom lasts longer than 3 months. Long standing back pain can be caused by a ruptured disc. About half the time the exact cause cannot be found. Arthritis, osteoporosis, tumors, infections, previous back surgery, and other causes may be involved. Anxiety and depression are common factors.  A ruptured disc often causes sciatica. This is a pain traveling from the low back down the back of the leg. This is due to irritation of the sciatic nerve. Weakness or numbness in the legs or feet and loss of normal bladder control are more serious signs of disc rupture. You should contact your doctor immediately if these symptoms develop.  Avoid bending, heavy lifting, prolonged sitting, and activities which make the problem worse. Continue normal activity as much as possible. Take brief periods of rest throughout the day to reduce your pain during bad periods. A back exercise rehabilitation program can be helpful in reducing symptoms and preventing more pain. Muscle relaxants are sometimes used. Using narcotic pain medicine for long term pain is discouraged. Addiction is a possible outcome.    Surgery is considered only when symptoms do not improve with   bed rest and other conservative treatment. You should see your caregiver if problems get worse.  SEEK IMMEDIATE MEDICAL CARE IF:   You have marked weakness or numbness in one of your legs.    You have trouble controlling your bladder or bowels.    You develop nausea, vomiting, abdominal pain, shortness of breath or fainting.    You have severe pain not relieved with medications.   Document Released: 11/25/2004 Document Re-Released: 01/14/2009  [employer] Patient Information 2011  [employer]

## 2011-01-21 NOTE — Unmapped (Signed)
THE [facility]     PATIENT NAME:   Anne Goodwin, Anne Goodwin             MR #:  [id number]   DATE OF BIRTH:  [birth year]                        ACCOUNT #:  [id number]   ED PHYSICIAN:   Tawni Pummel. Sena Slate, M.D.         ROOM #:   PRIMARY:        Selected Referral Pt              NURSING UNIT:  ED   REFERRING:      Selected Referral Pt              FC:  D   DICTATED BY:    Prince Rome. Loleta Chance, M.D.             ADMIT DATE:  01/21/2011   VISIT DATE:     01/21/2011                        DISCHARGE DATE:                               EMERGENCY DEPARTMENT NOTE     *-*-*     CHIEF COMPLAINT:  Back pain.     HISTORY OF PRESENT ILLNESS:  This is a 30 year old female with a history of   multiple previous back issues.  She has and L3, L4, L5 disk herniation per   record.  She has a previous history of L4-L5 diskectomy, although she states   to me that she had a C1-C2 fusion as well.  She states that she sees the pain   clinic, Dr. Tonette Bihari over [address] [facility].  She has also been seen by [facility] previously.  She has   been given a referral back to their services and they are supposed to contact   her next week.  She presents to [place] today by EMS with an exacerbation of her   chronic pain.  She describes lower back aching pain that radiates to her   buttocks and down her leg.  It does not go to her ankle.  She has no loss of   sensation in her legs or her inner thighs.  She has no loss of bowel or   bladder control.  She has had no fevers, no chills, no nausea, no vomiting.   She has not had any recent trauma.  She otherwise is without complaints.     PAST MEDICAL HISTORY:     1.  Possible C1-C2 fusion, per the patient's report, L3 through L5 disk   herniation and previous L4-L5 diskectomy.     ALLERGIES:     1.  Latex.   2.  Flagyl.     MEDICATIONS:     1.  Vicodin 5/500.   2.  Robaxin.   3.  Naproxen, which is managed by her Pain Clinic.     SOCIAL  HISTORY:  She does not smoke, drink or do illicit drugs.     REVIEW OF SYSTEMS:  All other systems reviewed, otherwise negative.     PHYSICAL EXAMINATION:   VITAL SIGNS:  Blood pressure 126/73, pulse is 76, respiratory rate 16,   temperature 97.9,  O2 saturation 98% on room air.   GENERAL:  This is a well-appearing 30 year old female who is sitting in bed   in no acute distress.   HEENT:  Head is atraumatic.  Pupils are equal, round and reactive.  Sclerae   are clear.  Oropharynx is nonerythematous.   NECK:  Supple.  No lymphadenopathy.   CHEST:  Clear to auscultation bilaterally.  No wheezes, rhonchi or rales.   CARDIOVASCULAR:  Regular rate and rhythm S1, S2, no murmurs, rubs, gallops.   BACK:  No midline C, T, or  L-spine tenderness.  Diffuse lower lumbar spine   paraspinal tenderness to palpation.  She has negative straight leg raise   bilaterally.   NEUROLOGIC:  She is alert and oriented x4.  Speech is intact.  Gait is   intact.  She is able to stand on tiptoes.  She has 5/5 strength in bilateral   lower extremities throughout.  She has 2+ knee jerk and ankle jerk reflexes   bilaterally and sensation intact to light touch in bilateral lower   extremities.     EMERGENCY DEPARTMENT COURSE/MEDICAL DECISION MAKING:  This is a 30 year old   female presenting to the emergency department with the above stated   complaint.  The patient was triaged back to I pod after being brought in by   squad, seen by myself and Dr. Sena Slate.  The patient had a thorough history   and physical exam performed.  She did get 60 mg of Toradol IM and two 5 mg   oxycodone pills initially which resulted in mild relief of her pain and was   subsequently given 1 mg of Dilaudid IM, which resulted in good relief of her   symptoms.  The pain clinic, was attempted to be contacted on multiple   occasions over the course of her hour and a half stay here in the emergency   department and I was unable to get ahold of them with regards to my question    as to whether or not outpatient prescriptions could be written for this   patient or have they been strictly managing her medications.  She herself has   attempted to contact them as well and has left a message with them to call   back.  She does appear to be attempting to establish appropriate outpatient   care, and is going to be contacted next week by [facility] for further   follow-up.  I see no evidence of an acute neurologic injury as a result of   this back pain.  I am not concerned for the possibility of cauda equina   syndrome, epidural abscess, or subdural hematoma, diskitis or osteomyelitis   at this point in time.  It does appear that the patient does have a simple   back pain, acute and chronic.  We will discharge her home with a prescription   for oxycodone to use as breakthrough pain.  She was given 5 mg tablets one to   two every six as needed for pain.  She is given a total of 20 of these.  She   was also given a prescription for Senna as she states that she has become   somewhat constipated recently.     DIAGNOSIS:     1.  Lumbago.     DISPOSITION:  The patient discharged home in good condition.     DISCHARGE INSTRUCTIONS:  As above.       *-*-*  _______________________________________   JMH/md                                 _____   D:  01/21/2011 13:57                  Prince Rome. Loleta Chance, M.D.   T:  01/21/2011 23:47   Job #:  [id number]                                         _______________________________________                                          _____                                         Tawni Pummel. Sena Slate, M.D.                                  EMERGENCY DEPARTMENT NOTE                                                                PAGE    1 of   1

## 2011-02-05 ENCOUNTER — Inpatient Hospital Stay
Admit: 2011-02-05 | Discharge: 2011-02-06 | Disposition: A | Discharge status: Home / Self Care | Attending: Emergency Medicine

## 2011-02-05 NOTE — Discharge Instructions (Signed)
Bruise, Contusion, Hematoma  A bruise (contusion) or hematoma is a collection of blood under skin causing an area of discoloration. It is caused by an injury to blood vessels beneath the injured area with a release of blood into that area. As blood accumulates it is known as a hematoma. This collection of blood causes a blue to dark blue color. As the injury improves over days to weeks it turns to a yellowish color and then usually disappears completely over the same period of time. These generally resolve completely without problems. The hematoma rarely requires drainage.  HOME CARE INSTRUCTIONS   Apply ice to the injured area for 20 minutes four times per day for the first 1 or 2 days.    Put the ice in a plastic bag and place a towel between the bag of ice and your skin. Discontinue the ice if it causes pain.    If bleeding is more than just a little, apply pressure to the area for at least thirty minutes to decrease the amount of bruising. Apply pressure and ice as your caregiver suggests.    If the injury is on an extremity, elevation of that part may help to decrease pain and swelling. Wrapping with an ace or supportive wrap may also be helpful. If the bruise is on a lower extremity and is painful, crutches may be helpful for a couple days.    If you have been given a tetanus shot because the skin was broken, your arm may get swollen, red and warm to touch at the shot site. This is a normal response to the medicine in the shot. If you did not receive a tetanus shot today because you did not recall when your last one was given, make sure to check with your caregiver's office and determine if one is needed. Generally for a "dirty" wound, you should receive a tetanus booster if you have not had one in the last five years. If you have a "clean" wound, you should receive a tetanus booster if you have not had one within the last ten years.   SEEK MEDICAL CARE IF:   You have pain not controlled with over the  counter medications. Only take over-the-counter or prescription medicines for pain, discomfort, or fever as directed by your caregiver. Do not use aspirin as it may cause bleeding.    You develop increasing pain or swelling in the area of injury.    An oral temperature above 102 F (38.9 C) develops, or as your caregiver suggests.    You develop any problems which seem worse than the problems which brought you in.   SEEK IMMEDIATE MEDICAL CARE IF:   You develop severe pain in the area of the bruise out of proportion to the initial injury.    The bruised area becomes red, tender, and swollen.   MAKE SURE YOU:    Understand these instructions.    Will watch your condition.    Will get help right away if you are not doing well or get worse.   Document Released: 07/28/2005 Document Re-Released: 01/14/2009  [employer] Patient Information 2011 [employer].
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
## 2011-02-05 NOTE — ED Notes (Signed)
xrays being done    Sudie Grumbling, RN  02/05/11 2140
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
## 2011-02-06 LAB — POC URINALYSIS
Bilirubin, Urine: NEGATIVE mg/dL
Blood, Urine: NEGATIVE
Glucose, UA: NEGATIVE mg/dL
Leukocyte Esterase, Urine: NEGATIVE
Nitrite, Urine: NEGATIVE
Protein, UA: NEGATIVE mg/dL
Specific Gravity, UA: 1.025 (ref 1.005–1.035)
Urobilinogen, Urine: 1 EU/dL (ref 0.2–1.0)
pH, UA: 6.5 (ref 5.0–8.0)

## 2011-02-06 LAB — POC PREGNANCY UR-QUAL: Pregnancy, Urine: NEGATIVE

## 2011-02-06 MED ORDER — ONDANSETRON HCL 4 MG/2ML IJ SOLN
4 MG/2ML | Freq: Once | INTRAMUSCULAR | Status: AC
Start: 2011-02-06 — End: 2011-02-05
  Administered 2011-02-06: 02:00:00 via INTRAMUSCULAR

## 2011-02-06 MED ORDER — OXYCODONE-ACETAMINOPHEN 5-325 MG PO TABS
5-325 MG | Freq: Once | ORAL | Status: AC
Start: 2011-02-06 — End: 2011-02-05
  Administered 2011-02-06: 04:00:00 via ORAL

## 2011-02-06 MED ORDER — OXYCODONE HCL 5 MG PO TABS
5 MG | ORAL_TABLET | ORAL | Status: DC | PRN
Start: 2011-02-06 — End: 2011-02-25

## 2011-02-06 MED ORDER — HYDROMORPHONE HCL 1 MG/ML IJ SOLN
1 MG/ML | Freq: Once | INTRAMUSCULAR | Status: AC
Start: 2011-02-06 — End: 2011-02-05
  Administered 2011-02-06: 02:00:00 via INTRAMUSCULAR

## 2011-02-06 MED FILL — ONDANSETRON HCL 4 MG/2ML IJ SOLN: 4 MG/2ML | INTRAMUSCULAR | Qty: 2

## 2011-02-06 MED FILL — PERCOCET 5-325 MG PO TABS: 5-325 MG | ORAL | Qty: 1

## 2011-02-06 MED FILL — HYDROMORPHONE HCL 1 MG/ML IJ SOLN: 1 MG/ML | INTRAMUSCULAR | Qty: 1

## 2011-02-06 NOTE — ED Notes (Signed)
Discharge instructions given to pt with prescription for oxycodone.  Pt verbally stated understanding.  Pt out of er with friend [facility].    Sudie Grumbling, RN  02/06/11 [phone]

## 2011-02-06 NOTE — ED Notes (Signed)
Knee immobilizer placed on pt    Sudie Grumbling, RN  02/06/11 0015

## 2011-02-11 ENCOUNTER — Inpatient Hospital Stay
Admit: 2011-02-11 | Discharge: 2011-02-11 | Disposition: A | Discharge status: Home / Self Care | Attending: Emergency Medicine

## 2011-02-11 MED ORDER — KETOROLAC TROMETHAMINE 60 MG/2ML IJ SOLN
60 MG/2ML | Freq: Once | INTRAMUSCULAR | Status: AC
Start: 2011-02-11 — End: 2011-02-11

## 2011-02-11 MED ORDER — OXYCODONE-ACETAMINOPHEN 5-325 MG PO TABS
5-325 MG | ORAL_TABLET | ORAL | Status: AC | PRN
Start: 2011-02-11 — End: 2011-02-18

## 2011-02-11 MED ORDER — KETOROLAC TROMETHAMINE 60 MG/2ML IJ SOLN
60 MG/2ML | INTRAMUSCULAR | Status: AC
Start: 2011-02-11 — End: 2011-02-11
  Administered 2011-02-11: 22:00:00 via INTRAMUSCULAR

## 2011-02-11 MED ORDER — MORPHINE SULFATE 4 MG/ML IJ SOLN
4 MG/ML | INTRAMUSCULAR | Status: DC | PRN
Start: 2011-02-11 — End: 2011-02-11
  Administered 2011-02-11: 22:00:00 4 mg via INTRAVENOUS

## 2011-02-11 MED FILL — MORPHINE SULFATE 4 MG/ML IJ SOLN: 4 mg/mL | INTRAMUSCULAR | Qty: 1

## 2011-02-11 MED FILL — KETOROLAC TROMETHAMINE 60 MG/2ML IJ SOLN: 60 MG/2ML | INTRAMUSCULAR | Qty: 2

## 2011-02-11 NOTE — Discharge Instructions (Signed)
You were seen in the emergency department today for back pain and leg pain. He will need to followup with her pain specialist for continued management of this pain. You're being prescribed enough pain medicine to through until that appointment next week.    Please return to emergency Department if:  -Severe pain  -Weakness in her legs  -Loss of bladder control  -Any new concerns    Back Pain (Lumbosacral Strain)  Back pain is one of the most common causes of pain. There are many causes of back pain. Most are not serious conditions.    CAUSES  Your backbone (spinal column) is made up of 24 main vertebral bodies, the sacrum, and the coccyx. These are held together by muscles and tough, fibrous tissue (ligaments). Nerve roots pass through the openings between the vertebrae. A sudden move or injury to the back may cause injury to, or pressure on, these nerves. This may result in localized back pain or pain movement (radiation) into the buttocks, down the leg, and into the foot. Sharp, shooting pain from the buttock down the back of the leg (sciatica) is frequently associated with a ruptured (herniated) disc. Pain may be caused by muscle spasm alone.  Your caregiver can often find the cause of your pain by the details of your symptoms and an exam. In some cases, you may need tests (such as X-rays). Your caregiver will work with you to decide if any tests are needed based on your specific exam.  HOME CARE INSTRUCTIONS   Avoid an underactive lifestyle. Active exercise, as directed by your caregiver, is your greatest weapon against back pain.    Avoid hard physical activities (tennis, racquetball, water-skiing) if you are not in proper physical condition for it. This may aggravate and/or create problems.    If you have a back problem, avoid sports requiring sudden body movements. Swimming and walking are generally safer activities.    Maintain good posture.    Avoid becoming overweight (obese).    Use bed rest for  only the most extreme, sudden (acute) episode. Your caregiver will help you determine how much bed rest is necessary.    For acute conditions, you may put ice on the injured area.    Put ice in a plastic bag.    Place a towel between your skin and the bag.    Leave the ice on for 30 minutes at a time, every 2 hours, or as needed.    After you are improved and more active, it may help to apply heat for 30 minutes before activities.   See your caregiver if you are having pain that lasts longer than expected. Your caregiver can advise appropriate exercises and/or therapy if needed. With conditioning, most back problems can be avoided.  SEEK IMMEDIATE MEDICAL CARE IF:   You have numbness, tingling, weakness, or problems with the use of your arms or legs.    You experience severe back pain not relieved with medicines.    There is a change in bowel or bladder control.    You have increasing pain in any area of the body, including your belly (abdomen).    You notice shortness of breath, dizziness, or feel faint.    You feel sick to your stomach (nauseous), are throwing up (vomiting), or become sweaty.    You notice discoloration of your toes or legs, or your feet get very cold.    Your back pain is getting worse.    You have  an oral temperature above 102 F (38.9 C), not controlled by medicine.   MAKE SURE YOU:    Understand these instructions.    Will watch your condition.    Will get help right away if you are not doing well or get worse.   Document Released: 01/12/2010   [facility] Patient Information 2011 [place], [place].

## 2011-02-11 NOTE — ED Notes (Signed)
Pt is c/o pain throughout her body, she has hx of chronic pain. Gave toradol and morphine for pain rated 10/10    Glade Stanford, RN  02/11/11 [phone]

## 2011-02-11 NOTE — ED Provider Notes (Signed)
Patient seen and examined.  Discussed with the resident.  Agree with assessment and plan.    Leota Sauers, MD  02/11/11 [phone]

## 2011-02-11 NOTE — ED Provider Notes (Signed)
This patient was seen by the mid-level provider.  I have seen and examined the patient, agree with the workup, evaluation, management and diagnosis.  Care plan has been discussed.      Rollene Rotunda, MD  02/11/11 [phone]
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
## 2011-02-25 ENCOUNTER — Inpatient Hospital Stay
Admit: 2011-02-25 | Discharge: 2011-02-25 | Disposition: A | Discharge status: Home / Self Care | Attending: Emergency Medicine

## 2011-02-25 MED ORDER — KETOROLAC TROMETHAMINE 60 MG/2ML IJ SOLN
60 MG/2ML | Freq: Four times a day (QID) | INTRAMUSCULAR | Status: AC | PRN
Start: 2011-02-25 — End: 2011-02-25

## 2011-02-25 MED ORDER — KETOROLAC TROMETHAMINE 60 MG/2ML IJ SOLN
60 MG/2ML | INTRAMUSCULAR | Status: AC
Start: 2011-02-25 — End: 2011-02-25
  Administered 2011-02-25: 17:00:00 via INTRAMUSCULAR

## 2011-02-25 MED FILL — KETOROLAC TROMETHAMINE 60 MG/2ML IJ SOLN: 60 MG/2ML | INTRAMUSCULAR | Qty: 2

## 2011-02-25 NOTE — ED Notes (Signed)
Pt left prior to nurse giving discharge instructions. MD Jones Broom had given verbal discharge instructions and refused to give pt narcotic pain medication.     Cecile Sheerer, RN  02/25/11 1404

## 2011-02-25 NOTE — ED Notes (Signed)
Pt back from xray. Pt laying on stomach crying.     Cecile Sheerer, RN  02/25/11 1326
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
## 2011-02-25 NOTE — ED Notes (Signed)
MD aware of pt request. No new orders at this time.     Cecile Sheerer, RN  02/25/11 1325

## 2011-02-25 NOTE — Discharge Instructions (Signed)
Knee Pain  The knee is a joint between your thigh and lower leg. It is made up of bones, tendons, ligaments, and cartilage. Knee pain can be caused by many things. A few causes could be injury to the knee, gout, arthritis, infections, or overuse during physical activity.  HOME CARE   Only take medicine as told by your doctor.    Keep a healthy weight. Being overweight can make the knee hurt more.    Stretch before exercising or playing sports.    If there is constant knee pain, change the way you or your child exercises. Ask your doctor for advice.    Make sure shoes fit well. Choose the right shoe for the sport or activity.    Protect the knees. Wear kneepads if needed.    Rest when tired.   GET HELP IF:   There is knee pain that does not stop.    There is knee pain that does not seem to be getting better.   GET HELP RIGHT AWAY IF:   The knee joint feels hot to the touch.    You or your child has a temperature by mouth above 102 F (38.9 C), not controlled by medicine.    Your baby is older than 3 months with a rectal temperature of 102 F (38.9 C) or higher.    Your baby is 3 months old or younger with a rectal temperature of 100.4 F (38 C) or higher.   MAKE SURE YOU:   Understand these instructions.    Will watch this condition.    Will get help right away if you or your child is not doing well or gets worse.   Document Released: 11/09/2009 Document Re-Released: 01/12/2010  [employer] Patient Information 2012 [employer]    Back Pain - Chronic  Back pain seldom lasts longer than 3 months. Long standing back pain can be caused by a ruptured disc. About half the time the exact cause cannot be found. Arthritis, osteoporosis, tumors, infections, previous back surgery, and other causes may be involved. Anxiety and depression are common factors.  A ruptured disc often causes sciatica. This is a pain traveling from the low back down the back of the leg. This is due to irritation of the  sciatic nerve. Weakness or numbness in the legs or feet and loss of normal bladder control are more serious signs of disc rupture. You should contact your doctor immediately if these symptoms develop.  Avoid bending, heavy lifting, prolonged sitting, and activities which make the problem worse. Continue normal activity as much as possible. Take brief periods of rest throughout the day to reduce your pain during bad periods. A back exercise rehabilitation program can be helpful in reducing symptoms and preventing more pain. Muscle relaxants are sometimes used. Using narcotic pain medicine for long term pain is discouraged. Addiction is a possible outcome.    Surgery is considered only when symptoms do not improve with bed rest and other conservative treatment. You should see your caregiver if problems get worse.  SEEK IMMEDIATE MEDICAL CARE IF:   You have marked weakness or numbness in one of your legs.    You have trouble controlling your bladder or bowels.    You develop nausea, vomiting, abdominal pain, shortness of breath or fainting.    You have severe pain not relieved with medications.   Document Released: 11/25/2004 Document Re-Released: 01/14/2009  [employer] Patient Information 2012 [employer]

## 2011-02-25 NOTE — ED Notes (Signed)
MD Fertel at bedside.     Cecile Sheerer, RN  02/25/11 1341

## 2011-02-25 NOTE — ED Provider Notes (Signed)
This patient was seen by the resident.?? I have seen and examined the patient, agree with the workup, evaluation, management and diagnosis.  Care plan has been discussed.      Maram Bently, MD  02/25/11 2042

## 2011-02-25 NOTE — ED Notes (Signed)
Pt medicated per order and states "i have had Toradol before and it doesn't do anything." Pt requests MD to prescribe something stronger.     Cecile Sheerer, RN  02/25/11 1318

## 2011-08-20 ENCOUNTER — Inpatient Hospital Stay
Admit: 2011-08-20 | Discharge: 2011-08-20 | Disposition: A | Discharge status: Home / Self Care | Attending: Emergency Medicine

## 2011-08-20 MED ORDER — OXYCODONE-ACETAMINOPHEN 5-325 MG PO TABS
5-325 MG | Freq: Once | ORAL | Status: AC
Start: 2011-08-20 — End: 2011-08-20
  Administered 2011-08-20: 06:00:00 via ORAL

## 2011-08-20 MED ORDER — OXYCODONE-ACETAMINOPHEN 5-325 MG PO TABS
5-325 MG | ORAL_TABLET | ORAL | Status: AC | PRN
Start: 2011-08-20 — End: 2011-08-27

## 2011-08-20 NOTE — Discharge Instructions (Signed)
Fractured Wrist  A fracture and a broken bone mean basically the same thing. A fractured wrist is often due to a fall on an outstretched hand. The most important part of treatment is immobilizing the wrist, and sometimes the elbow. You will be in a splint or cast for 4-6 weeks. If there is significant damage to the wrist, this must be corrected in order to ensure proper healing. Minor fractures without deformity do not require setting the bone (reduction) to heal correctly.   HOME CARE INSTRUCTIONS   Do not remove your splint or cast that has been applied to treat your injury.    If there is itching inside of the splint/cast area, DO NOT put anything between the splint/cast and skin to scratch the area. This can cause injury.    Keep your wrist elevated on pillows for 3-4 days to reduce swelling. A sling may also be used to help rest your injury.    Place ice packs over the wrist splint or cast for the next 2-3 days to help control pain and swelling.    Pain and anti-inflammatory medicine may be needed for the next few days.    Call your caregiver to arrange follow-up within the next few days as recommended.   SEEK MEDICAL CARE IF YOU HAVE:    Increased pain or pressure around your injury, or if your hand becomes numb, cold, or pale.    Loss of movement of the thumb or fingers.    Worsening swelling of your hand, fingers, and thumb.    Your splint/cast gets damaged or comes off.   MAKE SURE YOU:    Understand these instructions.    Will watch your condition.    Will get help right away if you are not doing well or get worse.   Document Released: 10/18/2005 Document Re-Released: 09/30/2008  [employer] Patient Information 2012 [employer]

## 2011-08-20 NOTE — ED Provider Notes (Signed)
TRIAGE CHIEF COMPLAINT:   Chief Complaint   Patient presents with   . Wrist Injury     fell down steps in dark at friends house. Left wrist with visible deformity.       HPI: [name] [name] is a 30 y.o. female who presents the emergency department complaining of left wrist pain.  Patient states she fell down some steps.  She did hit her head but denies any loss of consciousness.  She denies any other significant injuries from the fall.  No vomiting.    REVIEW OF SYSTEMS:   Pertinent review of systems otherwise negative.     PAST MEDICAL HISTORY:   Past Medical History   Diagnosis Date   . Miscarriage    . Depression    . Anxiety    . Gestational diabetes mellitus    . Chronic pain    . Bipolar affective disorder    . Chronic back pain greater than 3 months duration    . Gestational diabetes mellitus in childbirth      with pregnancy   . Herniated disc      lumbar 3-4-5        CURRENT MEDICATIONS:   No current facility-administered medications for this encounter.  Current outpatient prescriptions:oxyCODONE-acetaminophen (PERCOCET) 5-325 MG per tablet, Take 1 tablet by mouth every 4 hours as needed for Pain for 7 days., Disp: 20 tablet, Rfl: 0;  methocarbamol (ROBAXIN) 500 MG tablet, Take 500 mg by mouth 4 times daily.  , Disp: , Rfl: ;  hydrocodone-acetaminophen (VICODIN) 5-500 MG per tablet, Take 1 tablet by mouth every 6 hours as needed.  , Disp: , Rfl:   naproxen (NAPROXEN) 500 MG EC tablet, Take 500 mg by mouth 2 times daily (with meals).  , Disp: , Rfl: ;  norgestimate-ethinyl estradiol (ORTHO-CYCLEN, 28,) 0.25-35 MG-MCG per tablet, Take 1 tablet by mouth daily.  , Disp: , Rfl: ;  diazepam (VALIUM) 5 MG tablet, Take 5 mg by mouth every 8 hours as needed.  , Disp: , Rfl: ;  citalopram (CELEXA) 20 MG tablet, Take 40 mg by mouth daily., Disp: , Rfl:       SURGICAL HISTORY:       Procedure Date   . Ectopic pregnancy surgery    . Cervical spine surgery      C1-C2 fusion 2003        FAMILY HISTORY:   History  reviewed. No pertinent family history.     SOCIAL HISTORY:   History     Social History   . Marital Status: Legally Separated     Spouse Name: N/A     Number of Children: N/A   . Years of Education: N/A     Occupational History   . Not on file.     Social History Main Topics   . Smoking status: Former Smoker -- 0.2 packs/day for 11 years     Types: Cigarettes   . Smokeless tobacco: Never Used   . Alcohol Use: No   . Drug Use: No   . Sexually Active: Yes -- Female partner(s)     Other Topics Concern   . Not on file     Social History Narrative   . No narrative on file        ALLERGIES: Latex; Flagyl; and Keflex    PHYSICAL EXAM:  VITAL SIGNS: BP 152/96  Pulse 94  Temp(Src) 97.9 F (36.6 C) (Oral)  Resp 17  Ht 5\' 5"  (1.651  m)  Wt 122 lb (55.339 kg)  BMI 20.30 kg/m2  SpO2 100%  LMP 08/08/2011  Constitutional:  Patient appears uncomfortable holding her left wrist, Non-toxic appearance  HENT: Head is normocephalic and atraumatic.   Neck: Normal range of motion, No tenderness, Supple  Cardiovascular:  Normal heart rate, Normal rhythm, No murmurs  Pulmonary/Chest:  Normal breath sounds, No respiratory distress  Back:  No tenderness  Extremities:   Examination of the left upper extremity shows tenderness diffusely around the wrist more on the radial side.  There is a small area of soft tissue swelling.  No open wounds. No significant elbow or shoulder tenderness.  Other 3 extremities unremarkable.  Neurologic:  Alert & oriented x 3, no gross neurological deficits  Skin:  Warm, Dry, No erythema, No rash      Radiology / Procedures:  3 views of the left wrist show a nondisplaced distal radial fracture.  It does go into the joint.    ED COURSE:  Pertinent Labs & Imaging studies reviewed. (See chart for details)  Patient's wrist was immobilized.  Prescribed pain medicine and referred to orthopedics for followup.      FINAL IMPRESSION:  1 -- closed distal radial fracture  2 --      PLAN:    Splinted, pain medicine,  followup with orthopedics        Rogelia Mire, MD  08/20/11 [phone]

## 2011-08-20 NOTE — ED Notes (Signed)
Pt states understanding of discharge instructions. Pt agrees to follow up with referral. Pt denies any further needs at this time. Pt ambulated to discharge office.       Polo Riley, RN  08/20/11 [phone]

## 2011-11-03 ENCOUNTER — Inpatient Hospital Stay: Admit: 2011-11-03 | Discharge: 2011-11-03 | Discharge status: Against Medical Advice

## 2011-11-03 LAB — URINALYSIS
Bilirubin Urine: NEGATIVE
Blood, Urine: NEGATIVE
Glucose, Ur: NEGATIVE mg/dl
Ketones, Urine: NEGATIVE mg/dl
Nitrite, Urine: NEGATIVE
Protein, UA: 30 mg/dl
Specific Gravity, UA: 1.01 (ref 1.005–1.030)
Urobilinogen, Urine: 0.2 EU/dl (ref <2.0)
pH, UA: 8.5 — ABNORMAL HIGH (ref 4.5–8.0)

## 2011-11-03 LAB — PREGNANCY, URINE: Pregnancy, Urine: NEGATIVE

## 2012-02-08 ENCOUNTER — Inpatient Hospital Stay
Admit: 2012-02-08 | Discharge: 2012-02-09 | Disposition: A | Discharge status: Home / Self Care | Attending: Emergency Medicine

## 2012-02-08 LAB — POC URINALYSIS
Bilirubin Urine: NEGATIVE mg/dL
Blood, Urine: NEGATIVE
Glucose, Ur: NEGATIVE mg/dL
Ketones, Urine: 15 mg/dL — AB
Nitrite, Urine: NEGATIVE
Protein, UA: NEGATIVE mg/dL
Specific Gravity, UA: 1.015 (ref 1.005–1.035)
Urobilinogen, Urine: 0.2 EU/dL (ref 0.2–1.0)
pH, UA: 6 (ref 5.0–8.0)

## 2012-02-08 LAB — POC PREGNANCY UR-QUAL: Pregnancy, Urine: NEGATIVE

## 2012-02-08 MED ORDER — ONDANSETRON 4 MG PO TBDP
4 MG | Freq: Once | ORAL | Status: AC
Start: 2012-02-08 — End: 2012-02-08
  Administered 2012-02-08: via ORAL

## 2012-02-08 MED ORDER — ACETAMINOPHEN 325 MG PO TABS
325 MG | Freq: Once | ORAL | Status: AC
Start: 2012-02-08 — End: 2012-02-08
  Administered 2012-02-08: via ORAL

## 2012-02-08 MED FILL — ACETAMINOPHEN 325 MG PO TABS: 325 MG | ORAL | Qty: 2

## 2012-02-08 MED FILL — ONDANSETRON 4 MG PO TBDP: 4 mg | ORAL | Qty: 1

## 2012-02-08 NOTE — Discharge Instructions (Signed)
Bacterial Vaginosis  Bacterial vaginosis (BV) is a vaginal infection where the normal balance of bacteria in the vagina is disrupted. The normal balance is then replaced by an overgrowth of certain bacteria. There are several different kinds of bacteria that can cause BV. BV is the most common vaginal infection in women of childbearing age.  CAUSES    The cause of BV is not fully understood. BV develops when there is an increase or imbalance of harmful bacteria.   Some activities or behaviors can upset the normal balance of bacteria in the vagina and put women at increased risk including:   Having a new sex partner or multiple sex partners.   Douching.   Using an intrauterine device (IUD) for contraception.   It is not clear what role sexual activity plays in the development of BV. However, women that have never had sexual intercourse are rarely infected with BV.  Women do not get BV from toilet seats, bedding, swimming pools or from touching objects around them.   SYMPTOMS    Grey vaginal discharge.   A fish-like odor with discharge, especially after sexual intercourse.   Itching or burning of the vagina and vulva.   Burning or pain with urination.   Some women have no signs or symptoms at all.  DIAGNOSIS   Your caregiver must examine the vagina for signs of BV. Your caregiver will perform lab tests and look at the sample of vaginal fluid through a microscope. They will look for bacteria and abnormal cells (clue cells), a pH test higher than 4.5, and a positive amine test all associated with BV.   RISKS AND COMPLICATIONS    Pelvic inflammatory disease (PID).   Infections following gynecology surgery.   Developing HIV.   Developing herpes virus.  TREATMENT   Sometimes BV will clear up without treatment. However, all women with symptoms of BV should be treated to avoid complications, especially if gynecology surgery is planned. Female partners generally do not need to be treated. However, BV may spread  between female sex partners so treatment is helpful in preventing a recurrence of BV.    BV may be treated with antibiotics. The antibiotics come in either pill or vaginal cream forms. Either can be used with nonpregnant or pregnant women, but the recommended dosages differ. These antibiotics are not harmful to the baby.   BV can recur after treatment. If this happens, a second round of antibiotics will often be prescribed.   Treatment is important for pregnant women. If not treated, BV can cause a premature delivery, especially for a pregnant woman who had a premature birth in the past. All pregnant women who have symptoms of BV should be checked and treated.   For chronic reoccurrence of BV, treatment with a type of prescribed gel vaginally twice a week is helpful.  HOME CARE INSTRUCTIONS    Finish all medication as directed by your caregiver.   Do not have sex until treatment is completed.   Tell your sexual partner that you have a vaginal infection. They should see their caregiver and be treated if they have problems, such as a mild rash or itching.   Practice safe sex. Use condoms. Only have 1 sex partner.  PREVENTION   Basic prevention steps can help reduce the risk of upsetting the natural balance of bacteria in the vagina and developing BV:   Do not have sexual intercourse (be abstinent).   Do not douche.   Use all of the medicine   prescribed for treatment of BV, even if the signs and symptoms go away.   Tell your sex partner if you have BV. That way, they can be treated, if needed, to prevent reoccurrence.  SEEK MEDICAL CARE IF:    Your symptoms are not improving after 3 days of treatment.   You have increased discharge, pain, or fever.  MAKE SURE YOU:    Understand these instructions.   Will watch your condition.   Will get help right away if you are not doing well or get worse.  FOR MORE INFORMATION   Division of STD Prevention (DSTDP), Centers for Disease Control and Prevention:  www.cdc.gov/std  American Social Health Association (ASHA): www.ashastd.org   Document Released: 10/18/2005 Document Revised: 10/07/2011 Document Reviewed: 04/10/2009  Exit[employer] Patient Information 2012 [employer].    Trichomoniasis  Trichomoniasis is an infection, caused by the Trichomonas organism, that affects both women and men. In women, the outer female genitalia and the vagina are affected. In men, the penis is mainly affected, but the prostate and other reproductive organs can also be involved. Trichomoniasis is a sexually transmitted disease (STD) and is most often passed to another person through sexual contact. The majority of people who get trichomoniasis do so from a sexual encounter and are also at risk for other STDs.  CAUSES    Sexual intercourse with an infected partner.   It can be present in swimming pools or hot tubs.  SYMPTOMS    Abnormal gray-green frothy vaginal discharge in women.   Vaginal itching and irritation in women.   Itching and irritation of the area outside the vagina in women.   Penile discharge with or without pain in males.   Inflammation of the urethra (urethritis), causing painful urination.   Bleeding after sexual intercourse.  RELATED COMPLICATIONS   Pelvic inflammatory disease.   Infection of the uterus (endometritis).   Infertility.   Tubal (ectopic) pregnancy.   It can be associated with other STDs, including gonorrhea and chlamydia, hepatitis B, and HIV.  COMPLICATIONS DURING PREGNANCY   Early (premature) delivery.   Premature rupture of the membranes (PROM).   Low birth weight.  DIAGNOSIS    Visualization of Trichomonas under the microscope from the vagina discharge.   Ph of the vagina greater than 4.5, tested with a test tape.   Trich Rapid Test.   Culture of the organism, but this is not usually needed.   It may be found on a Pap test.   Having a "strawberry cervix,"which means the cervix looks very red like a strawberry.  TREATMENT    You  may be given medication to fight the infection. Inform your caregiver if you could be or are pregnant. Some medications used to treat the infection should not be taken during pregnancy.   Over-the-counter medications or creams to decrease itching or irritation may be recommended.   Your sexual partner will need to be treated if infected.  HOME CARE INSTRUCTIONS    Take all medication prescribed by your caregiver.   Take over-the-counter medication for itching or irritation as directed by your caregiver.   Do not have sexual intercourse while you have the infection.   Do not douche or wear tampons.   Discuss your infection with your partner, as your partner may have acquired the infection from you. Or, your partner may have been the person who transmitted the infection to you.   Have your sex partner examined and treated if necessary.   Practice safe, informed,   and protected sex.   See your caregiver for other STD testing.  SEEK MEDICAL CARE IF:    You still have symptoms after you finish the medication.   You have an oral temperature above 102 F (38.9 C).   You develop belly (abdominal) pain.   You have pain when you urinate.   You have bleeding after sexual intercourse.   You develop a rash.   The medication makes you sick or makes you throw up (vomit).  Document Released: 04/13/2001 Document Revised: 10/07/2011 Document Reviewed: 05/09/2009  Exit[employer] Patient Information 2012 [employer].

## 2012-02-08 NOTE — ED Provider Notes (Signed)
ED Note    Reason for Visit: Abdominal Pain and Hemoptysis      Patient History     HPI:  [name] [name] is a 31 y.o. female who presents to the Emergency Department with a chief complaint of hemostasis and pelvic pain with vaginal discharge.  Patient states since yesterday she has noticed midline pelvic cramping with radiation to both flanks and associated white vaginal discharge.  She states that she may have an STD that she is in a monogamous relationship and wears condoms.  She denies douching or intravaginal creams or lotions.  Denies any recent antibiotics.  She states that this is making her nauseous.  She's also reports hemoptysis which started today and describes occasional gross blood and occasional blood-tinged sputum.  She denies any preceding URI cough, fevers/chills, chest pain,leg pain, dyspnea, recent travel.  She denies other symptoms.       Past Medical History   Diagnosis Date   . Bipolar affective    . Paranoia    . OCD (obsessive compulsive disorder)    . Miscarriage    . Depression    . Anxiety    . Gestational diabetes mellitus    . Chronic pain    . Bipolar affective disorder    . Chronic back pain greater than 3 months duration    . Gestational diabetes mellitus in childbirth      with pregnancy   . 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 Cervical disc surgery     . Cesarean section     . Ectopic pregnancy surgery     . Cervical spine surgery       C1-C2 fusion 2003       Odilia Kyrra Prada @[facility] @    Discharge Medication List as of 02/08/2012  9:09 PM      CONTINUE these medications which have NOT CHANGED    Details   ibuprofen (ADVIL;MOTRIN) 200 MG tablet Take 200 mg by mouth every 6 hours as needed.        methocarbamol (ROBAXIN-750) 750 MG tablet Take 750 mg by mouth 4 times daily.      hydrocodone-acetaminophen (VICODIN) 5-500 MG per tablet Take 1 tablet by mouth every 6  hours as needed., Disp-30 tablet, R-0             Allergies:   Allergies as of 02/08/2012 - Review Complete 02/08/2012   Allergen Reaction Noted   . Latex Itching, Rash, and Other (See Comments) 06/18/2009   . Latex Rash 05/13/2010   . Flagyl (metronidazole) Shortness Of Breath 05/13/2010   . Keflex Shortness Of Breath 05/13/2010       Review of Systems     ROS: Review of systems were performed and positives were noted in the HPI.  All other systems were negative per patient.      Physical Exam     General: Well-developed well-nourished in no acute distress.  HEENT: tongue during is noted without obvious bleeding or infectious process.no obvious intraoral bleeding, dentition appears normal, oropharynx is clear and patent.Head normocephalic atraumatic.  Pupils equal and round.  External ears and nose normal.  No cervical lymphadenopathy.  Neck: Full range of motion, supple.  Pulmonary: Lungs clear to auscultation bilaterally.  No wheezing, rhonchi, or rales.  Cardiac: Regular rate and rhythm.  No murmurs, rubs, or gallops.  Clear S1, S2.  Abdomen:  Soft, nontender.  No rebound, guarding, or organomegaly noted.  No peritoneal findings.  Musculoskeletal: Moving all extremities appropriately with full range of motion.  No obvious weakness noted.  No pitting edema noted.  GU: With patient consent and female chaperone nurse Lyla Son, pelvic exam showed minimal white non-odorous discharge without cervical abnormalities.  No CMT, without adnexal fullness.  Skin: Warm and dry.  Neuro: Alert and oriented.  Cranial nerves II through XII without deficit.    Psych:  Normal affect, Normal judgement, Normal mood, affect and behavior.    ED Course and MDM     MEDICAL DECISION MAKING    Vitals:  BP 133/91  Pulse 100  Temp(Src) 98.5 F (36.9 C)  Resp 20  Wt 135 lb (61.236 kg)  BMI 22.47 kg/m2  SpO2 100%  LMP 01/12/2012  Breastfeeding? No    MEDICAL DECISION MAKING / ED COURSE:      The patient was seen and examined by myself  and presented to Dr. Mardi Mainland, MD, who also saw the patient.  Patient presents with pelvic pain, vaginal discharge and coughing up blood.  I asked the patient to produce a sputum sample and she just gathered some saliva in her mouth and spit it out with some blood-tinged saliva.  She did not cough to produce this.  I believe that the blood is coming somewhere from the mouth or oropharynx by could not see an obvious source on exam.  She does have a tongue ring which could be producing this blood.  I did not see any obvious intraoral infection.  She's not coughing or dyspneic I do not feel this requires any imaging studies.  I do not feel she has DVT/PE.      Her pelvic exam was as described in PE.  Her wet prep came back positive for trichomonas and bacterial vaginosis.  Patient has an allergy to Flagyl, dyspnea is the reaction.  According to [employer], there are no other adequate alternatives (>50% successful treatment rate).  They recommended desensitization to Flagyl.  I will have the patient follow up with her OBGYN for desensitization of Flagyl, and her GC swabs are pending.  Also encouraged her to f[facility]u with [facility] For HIV and syphilis testing.  Patient is aware she is not receiving treatment here.        The patient tolerated their visit well.  They were seen and evaluated by the attending physician who agreed with the assessment and plan.  The patient and / or the family were informed of the results of any tests, a time was given to answer questions, a plan was proposed and they agreed with plan.      DISCHARGE DIAGNOSIS:  1. Blood-tinged sputum    2. Trichomonas    3. Bacterial vaginosis        DISCHARGE MEDICATIONS:  Discharge Medication List as of 02/08/2012  9:09 PM          (Please note the MDM and HPI sections of this note were completed with a voice recognition progra  Critical Care Time (Attendings)             [place] Arrionna Serena, [place]  02/08/12 2150

## 2012-02-08 NOTE — ED Provider Notes (Signed)
Patient was seen examined, and discussed with the mid-level provider, I agree with their assessment and plan.  Briefly, The patient's abdomen is soft with no masses, rebound, or guarding.      Mardi Mainland, MD  02/08/12 2107

## 2012-02-08 NOTE — ED Notes (Signed)
Urine collected and sent to POC, pelvic exam set up at bedside     [facility], RN  02/08/12 1944

## 2012-02-09 LAB — MICROSCOPIC URINALYSIS

## 2012-02-11 LAB — C.TRACHOMATIS N.GONORRHOEAE DNA
C. Trachomatis Amplified: NEGATIVE
N. Gonorrhoeae Amplified: NEGATIVE

## 2012-06-23 ENCOUNTER — Inpatient Hospital Stay
Admit: 2012-06-23 | Discharge: 2012-06-23 | Disposition: A | Discharge status: Home / Self Care | Attending: Emergency Medicine

## 2012-06-23 MED ORDER — IBUPROFEN 600 MG PO TABS
600 MG | ORAL_TABLET | Freq: Three times a day (TID) | ORAL | Status: DC | PRN
Start: 2012-06-23 — End: 2012-07-17

## 2012-06-23 NOTE — ED Provider Notes (Signed)
HPI  History from patient.  She is right-hand dominant.  Last year in October she broke her wrist and had a splint put on it but never did follow up with orthopedics and took the splint off after about a month.  She states she continues to have pain in the left wrist ever since.  Tonight at work she slipped and grabbed on to the sink to avoid falling.  States that since then her left wrist is hurting more.  It was not a FOOSH mechanism.  She barely grabbed the sink.  She states it hurts worse since doing that.  Immunizations are noncontributory.  Review of Systems  Patient denies chance of pregnancy, tingling in the fingers. Remainder of review systems are reviewed and negative.  History     Social History   ??? Marital Status: Divorced     Spouse Name: N/A     Number of Children: 1   ??? Years of Education: N/A     Occupational History   ??? Not on file.     Social History Main Topics   ??? Smoking status: Current Some Day Smoker -- 0.50 packs/day for 11 years     Types: Cigarettes   ??? Smokeless tobacco: Never Used   ??? Alcohol Use: Yes      OCC   ??? Drug Use: No   ??? Sexually Active: Yes -- Female partner(s)     Other Topics Concern   ??? Not on file     Social History Narrative    ** Merged History Encounter **          Family History   Problem Relation Age of Onset   ??? Arthritis     ??? Asthma     ??? Cancer     ??? Diabetes     ??? Kidney Disease         Physical Exam  Nursing notes reviewed and agree.  BP 143/89   Pulse 85   Temp(Src) 98.3 ??F (36.8 ??C) (Oral)   Resp 18   Ht 5\' 5"  (1.651 m)   Wt 136 lb (61.689 kg)   BMI 22.63 kg/m2   SpO2 100%   LMP 05/29/2012  Gen: Well developed, well nourished. Nontoxic. Does not appear photophobic in well lit room.   Left upper extremity is within normal limits except for left wrist which revealed slight soft tissue tenderness on the volar and extensor surface of the distal region of the radioulnar joint.  There is no swelling or ecchymosis.  Distal neurovascular and tendon status intact.   Range of motion is complete of the wrist joint although done slowly by the patient.  There is no carpal bone tenderness or joint instability.  No signs of compartment syndrome.  Procedures    MDM    Labs      Radiology  X-rays of the left wrist is interpreted by me in the absence of immediate radiology review.  3 films that show no acute fracture or dislocation.    EKG Interpretation.        Bo Merino, MD  06/23/12 [id number]

## 2012-06-23 NOTE — Discharge Instructions (Signed)
Sprain  A sprain is a tear in one of the strong, fibrous tissues that connect your bones (ligaments). The severity of the sprain depends on how much of the ligament is torn. The tear can be either partial or complete.  CAUSES   Often, sprains are a result of a fall or an injury. The force of the impact causes the fibers of your ligament to stretch beyond their normal length. This excess tension causes the fibers of your ligament to tear.  SYMPTOMS   You may have some loss of motion or increased pain within your normal range of motion. Other symptoms include:   Bruising.   Tenderness.   Swelling.  DIAGNOSIS   In order to diagnose a sprain, your caregiver will physically examine you to determine how torn the ligament is. Your caregiver may also suggest an X-ray exam to make sure no bones are broken.  TREATMENT   If your ligament is only partially torn, treatment usually involves keeping the injured area in a fixed position (immobilization) for a short period. To do this, your caregiver will apply a bandage, cast, or splint to keep the area from moving until it heals. For a partially torn ligament, the healing process usually takes 2 to 3 weeks.  If your ligament is completely torn, you may need surgery to reconnect the ligament to the bone or to reconstruct the ligament. After surgery, a cast or splint may be applied and will need to stay on for 4 to 6 weeks while your ligament heals.  HOME CARE INSTRUCTIONS   Keep the injured area elevated to decrease swelling.   To ease pain and swelling, apply ice to your joint twice a day, for 2 to 3 days.   Put ice in a plastic bag.   Place a towel between your skin and the bag.   Leave the ice on for 15 minutes.   Only take over-the-counter or prescription medicine for pain as directed by your caregiver.   Do not leave the injured area unprotected until pain and stiffness go away (usually 3 to 4 weeks).   Do not allow your cast or splint to get wet. Cover your cast or  splint with a plastic bag when you shower or bathe. Do not swim.   Your caregiver may suggest exercises for you to do during your recovery to prevent or limit permanent stiffness.  SEEK IMMEDIATE MEDICAL CARE IF:   Your cast or splint becomes damaged.   Your pain becomes worse.  MAKE SURE YOU:   Understand these instructions.   Will watch your condition.   Will get help right away if you are not doing well or get worse.  Document Released: 10/15/2000 Document Revised: 01/10/2012 Document Reviewed: 10/30/2011  [employer] Patient Information 2013 [employer].

## 2012-07-09 ENCOUNTER — Inpatient Hospital Stay
Admit: 2012-07-09 | Discharge: 2012-07-09 | Disposition: A | Discharge status: Home / Self Care | Attending: Emergency Medicine

## 2012-07-09 LAB — URINALYSIS WITH REFLEX TO CULTURE
Bilirubin Urine: NEGATIVE mg/dL
Glucose, Ur: NEGATIVE mg/dL
Ketones, Urine: NEGATIVE mg/dL
Nitrite, Urine: NEGATIVE
Protein, UA: NEGATIVE mg/dL
Specific Gravity, UA: 1.015 (ref 1.005–1.030)
Urobilinogen, Urine: 0.2 E.U./dL (ref <2.0)
pH, UA: 7 (ref 5.0–8.0)

## 2012-07-09 LAB — URINE DRUG SCREEN
Amphetamine Screen, Urine: NEGATIVE
Barbiturate Screen, Ur: NEGATIVE (ref <200)
Benzodiazepine Screen, Urine: POSITIVE — AB (ref <200)
Cannabinoid Scrn, Ur: POSITIVE — AB (ref <50)
Cocaine Metabolite Screen, Urine: POSITIVE — AB (ref <300)
Methadone Screen, Urine: NEGATIVE (ref <300)
Opiate Scrn, Ur: NEGATIVE (ref <300)
PCP Screen, Urine: NEGATIVE (ref <25)
Propoxyphene Scrn, Ur: NEGATIVE (ref <300)
pH, UA: 6

## 2012-07-09 LAB — ETHANOL: Ethanol Lvl: 19 mg/dL — AB

## 2012-07-09 LAB — CBC WITH AUTO DIFFERENTIAL
Basophils %: 1 %
Basophils Absolute: 0.1 10*3/uL (ref 0.0–0.2)
Eosinophils %: 1.5 %
Eosinophils Absolute: 0.1 10*3/uL (ref 0.0–0.6)
Hematocrit: 37.2 % (ref 36.0–48.0)
Hemoglobin: 12 g/dL (ref 12.0–16.0)
Lymphocytes %: 41.3 %
Lymphocytes Absolute: 2.2 10*3/uL (ref 1.0–5.1)
MCH: 29.9 pg (ref 26.0–34.0)
MCHC: 32.4 g/dL (ref 31.0–36.0)
MCV: 92.2 fL (ref 80.0–100.0)
MPV: 8.4 fL (ref 5.0–10.5)
Monocytes %: 7.6 %
Monocytes Absolute: 0.4 10*3/uL (ref 0.0–1.3)
Neutrophils %: 48.6 %
Neutrophils Absolute: 2.6 10*3/uL (ref 1.7–7.7)
Platelets: 213 10*3/uL (ref 135–450)
RBC: 4.03 M/uL (ref 4.00–5.20)
RDW: 13.1 % (ref 12.4–15.4)
WBC: 5.3 10*3/uL (ref 4.0–11.0)

## 2012-07-09 LAB — BASIC METABOLIC PANEL
BUN: 9 mg/dL (ref 7–18)
CO2: 27 mEq/L (ref 21–32)
Calcium: 9 mg/dL (ref 8.3–10.6)
Chloride: 116 mEq/L — ABNORMAL HIGH (ref 99–110)
Creatinine: 0.6 mg/dL (ref 0.6–1.1)
GFR African American: >60 (ref >60)
GFR Non-African American: >60 (ref >60)
Glucose: 87 mg/dL (ref 70–99)
Potassium: 4 mEq/L (ref 3.5–5.1)
Sodium: 147 mEq/L — ABNORMAL HIGH (ref 136–145)

## 2012-07-09 LAB — PREGNANCY, URINE: HCG(Urine) Pregnancy Test: NEGATIVE

## 2012-07-09 LAB — MICROSCOPIC URINALYSIS

## 2012-07-09 MED ORDER — DIPHENHYDRAMINE HCL 50 MG/ML IJ SOLN
50 MG/ML | Freq: Once | INTRAMUSCULAR | Status: AC
Start: 2012-07-09 — End: 2012-07-09
  Administered 2012-07-09: 17:00:00 via INTRAVENOUS

## 2012-07-09 MED ORDER — IBUPROFEN 600 MG PO TABS
600 MG | ORAL_TABLET | Freq: Three times a day (TID) | ORAL | Status: DC | PRN
Start: 2012-07-09 — End: 2012-07-17

## 2012-07-09 MED ORDER — METOCLOPRAMIDE HCL 5 MG/ML IJ SOLN
5 MG/ML | Freq: Once | INTRAMUSCULAR | Status: AC
Start: 2012-07-09 — End: 2012-07-09
  Administered 2012-07-09: 17:00:00 via INTRAVENOUS

## 2012-07-09 MED ORDER — DIPHENHYDRAMINE HCL 50 MG/ML IJ SOLN
50 MG/ML | INTRAMUSCULAR | Status: AC
Start: 2012-07-09 — End: ?

## 2012-07-09 MED ORDER — KETOROLAC TROMETHAMINE 30 MG/ML IJ SOLN
30 MG/ML | Freq: Once | INTRAMUSCULAR | Status: AC
Start: 2012-07-09 — End: 2012-07-09
  Administered 2012-07-09: 17:00:00 via INTRAVENOUS

## 2012-07-09 MED ORDER — SODIUM CHLORIDE 0.9 % IV SOLN
0.9 % | INTRAVENOUS | Status: AC
Start: 2012-07-09 — End: ?

## 2012-07-09 MED ORDER — KETOROLAC TROMETHAMINE 30 MG/ML IJ SOLN
30 MG/ML | INTRAMUSCULAR | Status: AC
Start: 2012-07-09 — End: ?

## 2012-07-09 MED ORDER — SODIUM CHLORIDE 0.9 % IV BOLUS
0.9 % | Freq: Once | INTRAVENOUS | Status: AC
Start: 2012-07-09 — End: 2012-07-09
  Administered 2012-07-09: 17:00:00 via INTRAVENOUS

## 2012-07-09 MED ORDER — METOCLOPRAMIDE HCL 5 MG/ML IJ SOLN
5 MG/ML | INTRAMUSCULAR | Status: AC
Start: 2012-07-09 — End: ?

## 2012-07-09 MED FILL — DIPHENHYDRAMINE HCL 50 MG/ML IJ SOLN: 50 MG/ML | INTRAMUSCULAR | Qty: 1

## 2012-07-09 MED FILL — METOCLOPRAMIDE HCL 5 MG/ML IJ SOLN: 5 MG/ML | INTRAMUSCULAR | Qty: 2

## 2012-07-09 MED FILL — KETOROLAC TROMETHAMINE 30 MG/ML IJ SOLN: 30 MG/ML | INTRAMUSCULAR | Qty: 1

## 2012-07-09 MED FILL — SODIUM CHLORIDE 0.9 % IV SOLN: 0.9 % | INTRAVENOUS | Qty: 1000
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
## 2012-07-09 NOTE — ED Notes (Signed)
Rating HA 8/10 after medication . IVF cont to infuse     Higinio Roger, RN  07/09/12 1346
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
## 2012-07-09 NOTE — ED Notes (Signed)
IVF infused ha at [address] RN  07/09/12 [phone]

## 2012-07-09 NOTE — Discharge Instructions (Signed)
Headache  Headaches are caused by many different problems. Most commonly, headache is caused by muscle tension from an injury, fatigue, or emotional upset. Excessive muscle contractions in the scalp and neck result in a headache that often feels like a tight band around the head. Tension headaches often have areas of tenderness over the scalp and the back of the neck. These headaches may last for hours, days, or longer, and some may contribute to migraines in those who have migraine problems.  Migraines usually cause a throbbing headache, which is made worse by activity. Sometimes only one side of the head hurts. Nausea, vomiting, eye pain, and avoidance of food are common with migraines. Visual symptoms such as light sensitivity, blind spots, or flashing lights may also occur. Loud noises may worsen migraine headaches. Many factors may cause migraine headaches:   Emotional stress, lack of sleep, and menstrual periods.   Alcohol and some drugs (such as birth control pills).   Diet factors (fasting, caffeine, food preservatives, chocolate).   Environmental factors (weather changes, bright lights, odors, smoke).  Other causes of headaches include minor injuries to the head. Arthritis in the neck; problems with the jaw, eyes, ears, or nose are also causes of headaches. Allergies, drugs, alcohol, and exposure to smoke can also cause moderate headaches. Rebound headaches can occur if someone uses pain medications for a long period of time and then stops. Less commonly, blood vessel problems in the neck and brain (including stroke) can cause various types of headache.  Treatment of headaches includes medicines for pain and relaxation. Ice packs or heat applied to the back of the head and neck help some people. Massaging the shoulders, neck and scalp are often very useful. Relaxation techniques and stretching can help prevent these headaches. Avoid alcohol and cigarette smoking as these tend to make headaches worse.  Please see your caregiver if your headache is not better in 2 days.   SEEK IMMEDIATE MEDICAL CARE IF:    You develop a high fever, chills, or repeated vomiting.   You faint or have difficulty with vision.   You develop unusual numbness or weakness of your arms or legs.   Relief of pain is inadequate with medication, or you develop severe pain.   You develop confusion, or neck stiffness.   You have a worsening of a headache or do not obtain relief.  Document Released: 10/18/2005 Document Revised: 10/07/2011 Document Reviewed: 04/13/2007  [facility] Patient Information 2012 [place], [place].    Overdose, Adult  A person can overdose on alcohol, drugs or both by accident or on purpose. If it was on purpose, it is a serious matter. Professional help should be sought. If the overdose was an accident, certain steps should be taken to make sure that it never happens again.  ACCIDENTAL OVERDOSE  Overdosing on prescription medications can be a result of:   Not understanding the instructions.   Misreading the label.   Forgetting that you took a dose and then taking another by mistake. This situation happens a lot.  To make sure this does not happen again:   Clarify the correct dosage with your caregiver.   Place the correct dosage in a "pill-minder" container (labeled for each day and time of day).   Have someone dispense your medicine.  Please be sure to follow-up with your primary care doctor as directed.  INTENTIONAL OVERDOSE  If the overdose was on purpose, it is a serious situation. Taking more than the prescribed amount of medications (including  taking someone else's prescription), abusing street drugs or drinking an amount of alcohol that requires medical treatment can show a variety of possible problems. These may indicate you:   Are depressed or suicidal.   Are abusing drugs, took too much or combined different drugs to experiment with the effects.   Mixed alcohol with drugs and did not realize the danger  of doing so (this is drug abuse).   Are suffering addiction to drugs and/or alcohol (also known as chemical dependency).   Binge drink.  If you have not been referred to a mental health professional for help, it is important that you get help right away. Only a professional can determine which problems may exist and what the best course of treatment may be. It is your responsibility to follow-up with further evaluation or treatment as directed.   Alcohol is responsible for a large number of overdoses and unintended deaths among college-age young adults. Binge drinking is consuming 4-5 drinks in a short period of time. The amount of alcohol in standard servings of wine (5 oz.), beer (12 oz.) and distilled spirits (1.5 oz., 80 proof) is the same. Beer or wine can be just as dangerous to the binge drinker as "hard" liquor can be.   CONSEQUENCES OF BINGE DRINKING  Alcohol poisoning is the most serious consequence of binge drinking. This is a severe and potentially fatal physical reaction to an alcohol overdose. When too much alcohol is consumed, the brain does not get enough oxygen. The lack of oxygen will eventually cause the brain to shut down the voluntary functions that regulate breathing and heart rate. Symptoms of alcohol poisoning include:   Vomiting.   Passing out (unconsciousness).   Cold, clammy, pale or bluish skin.   Slow or irregular breathing.  WHAT SHOULD I DO NEXT?  If you have a history of drug abuse or suffer chemical dependency (alcoholism, drug addiction or both), you might consider the following:   Talk with a qualified substance abuse counselor and consider entering a treatment program.   Go to a detox facility if necessary.   If you were attending self-help group meetings, consider returning to them and go often.   Explore other resources located near you (see sources listed below).  If you are unsure if you have a substance abuse problem, ask yourself the following questions:   Have  you been told by friends or family that drugs/alcohol has become a problem?   Do you get into fights when drinking or using drugs?   Do you have blackouts (not remembering what you do while using)?   Do you lie about use or amounts of drugs or alcohol you consume?   Do you need chemicals to get you going?   Do you suffer in work or school performance because of drug or alcohol use?   Do you get sick from drug or alcohol use but continue to use anyway?   Do you need drugs or alcohol to relate to people or feel comfortable in social situations?   Do you use drugs or alcohol to forget problems?  If you answered "Yes" to any of the above questions, it means you show signs of chemical dependency and a professional evaluation is suggested. The longer the use of drugs and alcohol continues, the problems will become greater.  SEEK IMMEDIATE MEDICAL CARE IF:    You feel like you might repeat your problematic behavior.   You need someone to talk to and feel that it  should not wait.   You feel you are a danger to yourself or someone else.   You feel like you are having a new reaction to medications you are taking, or you are getting worse after leaving a care center.   You have an overwhelming urge to drink or use drugs.  Addiction cannot be cured, but it can be treated successfully. Treatment centers are listed in the yellow pages under: Cocaine, Narcotics, and Alcoholics Anonymous. Most hospitals and clinics can refer you to a specialized care center. The [place] government maintains a toll-free number for obtaining treatment referrals: (707)199-8627 or 407-533-1346 (TDD) and maintains a website: http://findtreatment.RockToxic.pl. Other websites for additional information are: www.mentalhealth.RockToxic.pl. and GreatestFeeling.tn.  In [place] treatment resources are listed in each [place] with listings available under [employer] for [employer] or similar titles.  Document Released: 10/21/2003 Document Revised:  01/10/2012 Document Reviewed: 09/11/2008  [facility] Patient Information 2013 [place], [place].

## 2012-07-09 NOTE — ED Notes (Signed)
meds per order, scanner did not scan , restarted  Computer. Bed alarm active . Educated to report any discomfort at IV site     Higinio Roger, RN  07/09/12 1249
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
## 2012-07-14 ENCOUNTER — Inpatient Hospital Stay
Admit: 2012-07-14 | Discharge: 2012-07-14 | Disposition: A | Discharge status: Home / Self Care | Attending: Emergency Medicine

## 2012-07-14 MED ORDER — HYDROCODONE-ACETAMINOPHEN 5-325 MG PO TABS
5-325 MG | ORAL | Status: AC
Start: 2012-07-14 — End: ?

## 2012-07-14 MED ORDER — IBUPROFEN 800 MG PO TABS
800 MG | ORAL_TABLET | Freq: Three times a day (TID) | ORAL | Status: DC | PRN
Start: 2012-07-14 — End: 2013-10-19

## 2012-07-14 MED ORDER — TIZANIDINE HCL 4 MG PO TABS
4 MG | ORAL_TABLET | Freq: Four times a day (QID) | ORAL | Status: DC | PRN
Start: 2012-07-14 — End: 2013-10-19

## 2012-07-14 MED ORDER — HYDROCODONE-ACETAMINOPHEN 5-325 MG PO TABS
5-325 MG | Freq: Once | ORAL | Status: AC
Start: 2012-07-14 — End: 2012-07-14
  Administered 2012-07-14: 09:00:00 via ORAL

## 2012-07-14 MED FILL — HYDROCODONE-ACETAMINOPHEN 5-325 MG PO TABS: 5-325 MG | ORAL | Qty: 1

## 2012-07-14 NOTE — ED Provider Notes (Signed)
I have personally seen and examined this patient. I have fully participated in the care of this patient. I have reviewed and agree with all pertinent clinical information including history, physical exam, diagnostic tests, and the plan.   HPI: [name] [name] presented with chronic lower back pain.  Patient states the pain has been worse over the last 2 months.  She denies a loss of bowel or bladder control.  She denies saddle anesthesia.  She states she does have some numbness and radiation into both legs.  She denies any weakness.  She denies any trauma.  Chief Complaint   Patient presents with   ??? Back Pain     Pt states that she has had lower back pain over last couple months. States that the pain is so bad that she is unable to sleep tonight. Expectation "pain relief so she can sleep".     Review of Systems: See PA note  Vital Signs: BP 152/93   Pulse 64   Temp(Src) 97.8 ??F (36.6 ??C) (Oral)   Resp 12   Ht 5\' 5"  (1.651 m)   Wt 136 lb (61.689 kg)   BMI 22.63 kg/m2   SpO2 97%   LMP 07/12/2012  Alert 31 y.o. female who does not appear toxic or acutely ill  HENT: Atraumatic, oral mucosa moist  Neck: Grossly normal ROM  Chest/Lungs: respiratory effort normal   Abdomen: Soft and nontender  Back: Mild lumbar tenderness without CVA tenderness  Neurologic: Good bilateral plantar and dorsi flexion, she is able to lift both legs off the bed.  Musculoskeletal: Grossly normal ROM  Skin: No palor or diaphoresis  Medical Decision Making and Plan:  Pertinent Labs & Imaging studies reviewed. (See chart for details)  I agree with PA  assessment and plan      Gerald Leitz, MD  07/14/12 [phone]

## 2012-07-14 NOTE — ED Notes (Signed)
PT provided discharge instructions . Verbalized understanding. Driver present in ER.    Ulysees Barns, RN  07/14/12 [phone]
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
## 2012-07-14 NOTE — Discharge Instructions (Signed)
Please call [name] [name] at [phone] if you have difficulty following up as an outpatient.    Back Exercises  Back exercises help treat and prevent back injuries. The goal of back exercises is to increase the strength of your abdominal and back muscles and the flexibility of your back. These exercises should be started when you no longer have back pain. Back exercises include:   Pelvic Tilt. Lie on your back with your knees bent. Tilt your pelvis until the lower part of your back is against the floor. Hold this position 5 to 10 sec and repeat 5 to 10 times.   Knee to Chest. Pull first 1 knee up against your chest and hold for 20 to 30 seconds, repeat this with the other knee, and then both knees. This may be done with the other leg straight or bent, whichever feels better.   Sit-Ups or Curl-Ups. Bend your knees 90 degrees. Start with tilting your pelvis, and do a partial, slow sit-up, lifting your trunk only 30 to 45 degrees off the floor. Take at least 2 to 3 seconds for each sit-up. Do not do sit-ups with your knees out straight. If partial sit-ups are difficult, simply do the above but with only tightening your abdominal muscles and holding it as directed.   Hip-Lift. Lie on your back with your knees flexed 90 degrees. Push down with your feet and shoulders as you raise your hips a couple inches off the floor; hold for 10 seconds, repeat 5 to 10 times.   Back arches. Lie on your stomach, propping yourself up on bent elbows. Slowly press on your hands, causing an arch in your low back. Repeat 3 to 5 times. Any initial stiffness and discomfort should lessen with repetition over time.   Shoulder-Lifts. Lie face down with arms beside your body. Keep hips and torso pressed to floor as you slowly lift your head and shoulders off the floor.  Do not overdo your exercises, especially in the beginning. Exercises may cause you some mild back discomfort which lasts for a few minutes; however, if the pain is more  severe, or lasts for more than 15 minutes, do not continue exercises until you see your caregiver. Improvement with exercise therapy for back problems is slow.   See your caregivers for assistance with developing a proper back exercise program.  Document Released: 11/25/2004 Document Revised: 01/10/2012 Document Reviewed: 10/18/2005  [facility] Patient Information 2013 [place], [place].    Back Pain, Adult  Low back pain is very common. About 1 in 5 people have back pain.The cause of low back pain is rarely dangerous. The pain often gets better over time.About half of people with a sudden onset of back pain feel better in just 2 weeks. About 8 in 10 people feel better by 6 weeks.   CAUSES  Some common causes of back pain include:   Strain of the muscles or ligaments supporting the spine.   Wear and tear (degeneration) of the spinal discs.   Arthritis.   Direct injury to the back.  DIAGNOSIS  Most of the time, the direct cause of low back pain is not known.However, back pain can be treated effectively even when the exact cause of the pain is unknown.Answering your caregiver's questions about your overall health and symptoms is one of the most accurate ways to make sure the cause of your pain is not dangerous. If your caregiver needs more information, he or she may order lab work or imaging tests (  X-rays or MRIs).However, even if imaging tests show changes in your back, this usually does not require surgery.  HOME CARE INSTRUCTIONS  For many people, back pain returns.Since low back pain is rarely dangerous, it is often a condition that people can learn to [facility] their own.    Remain active. It is stressful on the back to sit or stand in one place. Do not sit, drive, or stand in one place for more than 30 minutes at a time. Take short walks on level surfaces as soon as pain allows.Try to increase the length of time you walk each day.   Do not stay in bed.Resting more than 1 or 2 days can delay your  recovery.   Do not avoid exercise or work.Your body is made to move.It is not dangerous to be active, even though your back may hurt.Your back will likely heal faster if you return to being active before your pain is gone.   Pay attention to your body when you bend and lift. Many people have less discomfortwhen lifting if they bend their knees, keep the load close to their bodies,and avoid twisting. Often, the most comfortable positions are those that put less stress on your recovering back.   Find a comfortable position to sleep. Use a firm mattress and lie on your side with your knees slightly bent. If you lie on your back, put a pillow under your knees.   Only take over-the-counter or prescription medicines as directed by your caregiver. Over-the-counter medicines to reduce pain and inflammation are often the most helpful.Your caregiver may prescribe muscle relaxant drugs.These medicines help dull your pain so you can more quickly return to your normal activities and healthy exercise.   Put ice on the injured area.   Put ice in a plastic bag.   Place a towel between your skin and the bag.   Leave the ice on for 15 to 20 minutes, 3 to 4 times a day for the first 2 to 3 days. After that, ice and heat may be alternated to reduce pain and spasms.   Ask your caregiver about trying back exercises and gentle massage. This may be of some benefit.   Avoid feeling anxious or stressed.Stress increases muscle tension and can worsen back pain.It is important to recognize when you are anxious or stressed and learn ways to manage it.Exercise is a great option.  SEEK MEDICAL CARE IF:   You have pain that is not relieved with rest or medicine.   You have pain that does not improve in 1 week.   You have new symptoms.   You are generally not feeling well.  SEEK IMMEDIATE MEDICAL CARE IF:    You have pain that radiates from your back into your legs.   You develop new bowel or bladder control  problems.   You have unusual weakness or numbness in your arms or legs.   You develop nausea or vomiting.   You develop abdominal pain.   You feel faint.  Document Released: 10/18/2005 Document Revised: 01/10/2012 Document Reviewed: 03/08/2011  [facility] [place] Patient Information 2013 [place], [place].

## 2012-07-17 MED ORDER — PREDNISONE 10 MG PO TABS
10 | ORAL_TABLET | ORAL | 0.00 refills | 6.00000 days | Status: DC
Start: 2012-07-17 — End: 2013-10-19

## 2012-07-17 MED ORDER — HYDROCODONE-ACETAMINOPHEN 5-325 MG PO TABS
5-325 | ORAL_TABLET | ORAL | 0.00 refills | 3.00000 days | Status: AC | PRN
Start: 2012-07-17 — End: 2012-07-24

## 2012-07-17 MED ORDER — CYCLOBENZAPRINE HCL 10 MG PO TABS
10 | ORAL_TABLET | Freq: Three times a day (TID) | ORAL | 0.00 refills | 10.00000 days | Status: AC | PRN
Start: 2012-07-17 — End: 2012-07-27

## 2012-07-17 NOTE — Progress Notes (Signed)
[name] [name] is a 31 y.o. female.    HPI:  Here as first time visit , been in ed many times for neck and low back pain  Has been to pain specialist in past  Due to see psych in 1 day  G5P3  Lives with father - shared custody with 2 children ( 6, 2), gave up for spinal bifida  Has a case worker  No insurance at present  Works at [employer] cig to try to quit    ROS:  Gen: no fever  HEENT: no cold symptoms, sore throat.  CV:  Denies chest pain or palpitations.  Pulm:  Denies shortness of breath, cough.  Abd:  Denies abdominal pain, nausea and vomiting.  Skin: no rash      OBJECTIVE:  BP 130/79   Pulse 82   Ht 5\' 5"  (1.651 m)   Wt 134 lb (60.782 kg)   BMI 22.3 kg/m2   LMP 07/12/2012   Breastfeeding? No  GEN:  in NAD.  NECK:  Supple without adenopathy.  CV:  Regular rate and rhythm, S1 and S2 normal, no murmurs, clicks, gallops or rubs.   PULM:  Chest is clear, no wheezing ,  symmetric air entry throughout both lung fields.  ABD: Soft, NT, normal bowel sounds, no masses.  EXT: No rash or edema  NEURO: nonfocal    ASSESSMENT:  chronic neck and back pain  Bipolar?  Tob abuse  PLAN:    See orders and patient instruction  norco   Prednisone taper ( hold motrin)  Heat  flexoril  F/u case worker  F/u  [facility]  I told pt it is very important to quit smoking!  When ready, I would like to schedule an appointment to discuss the various tools we can offer, such as classes, hypnosis, accupuncture, or/and medications. Suggest pt. get to a class, pick a quit date, and RTO to discuss.  Multiple approaches toward motivating the patient were explored.

## 2013-04-04 ENCOUNTER — Inpatient Hospital Stay: Admit: 2013-04-04 | Discharge: 2013-04-04 | Disposition: A | Discharge status: Still In Hospital

## 2013-04-04 LAB — POC GLU MONITORING DEVICE: POC Glucose Monitoring Device: 85 mg/dL (ref 65–99)

## 2013-04-04 MED ORDER — hydrOXYzine pamoate (VISTARIL) 50 MG capsule
50 | ORAL_CAPSULE | ORAL | Status: AC | PRN
Start: 2013-04-04 — End: 2018-07-23

## 2013-04-04 MED ORDER — hydrOXYzine pamoate (VISTARIL) capsule 50 mg
50 | Freq: Once | ORAL | Status: AC
Start: 2013-04-04 — End: 2013-04-04

## 2013-04-04 NOTE — Unmapped (Signed)
Discharged with written instructions and fetal kick counts.  To follow up with her OB/GYN and to call her psychologist asap.

## 2013-04-04 NOTE — Unmapped (Signed)
Please take Vistaril for pelvic cramping.  Call your psychologist tomorrow.  Follow up with PES at [facility] if you feel you need to be seen acutely.  Please return or call for help if you feel like hurting yourself or others.

## 2013-04-04 NOTE — Unmapped (Signed)
I did not see or examine the patient. I discussed with the resident and agree with Dr. Babs Bertin and Dr. Eliot Ford findings and plan as documented in the resident's note. Pt denies any obstetric complaints and had come to hospital due to anxiety. She had already called her psychologist on her way in. She unfortunately has a poor social situation and is currently residing in the [facility]--She is on the list for a subsidized apartment. Pt is agreeable to try vistaril for therapeutic rest and will see her psychologist tomorrow.      Durene Romans, MD

## 2013-04-04 NOTE — Unmapped (Signed)
Arrived to triage from the ED c/o back pain, has known back issues.  Also has anxiety and states she gets her care at [facility] but came to [facility] because she thought we still had our psych department.  Denies srom, vag bleeding.  Not sure if she is having contractions or not. States baby is moving well.  Call light within reach

## 2013-04-04 NOTE — Unmapped (Addendum)
Pt states she has chronic low back ain d/t sliped discs. Pt endorses this as still continuing. Pt also states she is having increased anxiety and didn't know Psych had moved to [place]. Pt denies SI or HI to this RN. Pt denies OB complaints to this RN.
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
## 2013-04-30 LAB — POCT GLUCOSE: POC Glucose: 94 mg/dl (ref 70–99)

## 2013-04-30 LAB — URINE DRUG SCREEN
Amphetamine Screen, Urine: NEGATIVE
Barbiturate Screen, Ur: NEGATIVE (ref <200)
Benzodiazepine Screen, Urine: NEGATIVE (ref <200)
Cannabinoid Scrn, Ur: NEGATIVE (ref <50)
Cocaine Metabolite Screen, Urine: NEGATIVE (ref <300)
Methadone Screen, Urine: NEGATIVE (ref <300)
Opiate Scrn, Ur: POSITIVE — AB (ref <300)
PCP Screen, Urine: NEGATIVE (ref <25)
Propoxyphene Scrn, Ur: NEGATIVE (ref <300)
pH, UA: 6.5

## 2013-04-30 LAB — URINALYSIS
Bilirubin Urine: NEGATIVE mg/dL
Blood, Urine: NEGATIVE
Glucose, Ur: NEGATIVE mg/dL
Ketones, Urine: NEGATIVE mg/dL
Nitrite, Urine: NEGATIVE
Protein, UA: NEGATIVE mg/dL
Specific Gravity, UA: 1.02 (ref 1.005–1.030)
Urobilinogen, Urine: 1 E.U./dL (ref <2.0)
pH, UA: 6.5 (ref 5.0–8.0)

## 2013-04-30 LAB — MICROSCOPIC URINALYSIS: RBC, UA: NONE SEEN /HPF (ref 0–2)

## 2013-04-30 NOTE — Procedures (Signed)
FETAL SURVEILLANCE TESTING SUMMARY    INDICATIONS:  patient reassurance    OBJECTIVE RESULTS:  Fetal heart variability: moderate    Fetal surveillance: reassuring

## 2013-04-30 NOTE — Other (Signed)
Discharge instructions provided. Educated about s/s of labor and how to preform kick counts. Pt instructed to follow up with regular provider as scheduled. Verbalized understanding of all instructions and denies questions. Signed copy in chart, 1 with pt. Discharge to home, ambulatory.

## 2013-04-30 NOTE — Discharge Instructions (Signed)
Home Undelivered Discharge Instructions    After Discharge Orders:    Keep appointment next Monday at [facility] with Dr. Merlinda Frederick  Call physician or midwife's office for further questions or concerns.           Diet:  normal diet as tolerated     Rest: normal activity as tolerated    Other instructions: Do kick counts once a day on your baby. Choose the time of day your baby is most active. Get in a comfortable lying or sitting position and time how long it takes to feel 10 kicks, twists, turns, swishes, or rolls. Call your physician or midwife if there have not been 10 kicks in 1 hours    Call physician or midwife, return to Labor and Delivery, call 911, or go to the nearest Emergency Room if: increased leakage or fluid, contractions more than  5 per  1 hour, decreased fetal movement, persistent low back pain or cramping, bleeding from vaginal area, difficulty urinating, pain with urination, difficulty breathing, new calf pain, persistent headache or vision change

## 2013-05-22 ENCOUNTER — Inpatient Hospital Stay: Admit: 2013-05-22 | Discharge: 2013-05-22 | Attending: Emergency Medicine

## 2013-05-22 MED ORDER — TRAMADOL HCL 50 MG PO TABS
50 MG | ORAL_TABLET | Freq: Four times a day (QID) | ORAL | Status: DC | PRN
Start: 2013-05-22 — End: 2013-05-22

## 2013-05-22 MED ORDER — OXYCODONE-ACETAMINOPHEN 5-325 MG PO TABS
5-325 MG | Freq: Once | ORAL | Status: AC
Start: 2013-05-22 — End: 2013-05-22
  Administered 2013-05-22: 15:00:00 via ORAL

## 2013-05-22 MED ORDER — NAPROXEN 500 MG PO TABS
500 MG | ORAL_TABLET | Freq: Two times a day (BID) | ORAL | Status: DC
Start: 2013-05-22 — End: 2013-10-19

## 2013-05-22 MED FILL — PERCOCET 5-325 MG PO TABS: 5-325 MG | ORAL | Qty: 2

## 2013-05-22 NOTE — Discharge Instructions (Signed)
Back Pain, Adult  Low back pain is very common. About 1 in 5 people have back pain.The cause of low back pain is rarely dangerous. The pain often gets better over time.About half of people with a sudden onset of back pain feel better in just 2 weeks. About 8 in 10 people feel better by 6 weeks.   CAUSES  Some common causes of back pain include:   Strain of the muscles or ligaments supporting the spine.   Wear and tear (degeneration) of the spinal discs.   Arthritis.   Direct injury to the back.  DIAGNOSIS  Most of the time, the direct cause of low back pain is not known.However, back pain can be treated effectively even when the exact cause of the pain is unknown.Answering your caregiver's questions about your overall health and symptoms is one of the most accurate ways to make sure the cause of your pain is not dangerous. If your caregiver needs more information, he or she may order lab work or imaging tests (X-rays or MRIs).However, even if imaging tests show changes in your back, this usually does not require surgery.  HOME CARE INSTRUCTIONS  For many people, back pain returns.Since low back pain is rarely dangerous, it is often a condition that people can learn to manageon their own.    Remain active. It is stressful on the back to sit or stand in one place. Do not sit, drive, or stand in one place for more than 30 minutes at a time. Take short walks on level surfaces as soon as pain allows.Try to increase the length of time you walk each day.   Do not stay in bed.Resting more than 1 or 2 days can delay your recovery.   Do not avoid exercise or work.Your body is made to move.It is not dangerous to be active, even though your back may hurt.Your back will likely heal faster if you return to being active before your pain is gone.   Pay attention to your body when you bend and lift. Many people have less discomfortwhen lifting if they bend their knees, keep the load close to their  bodies,and avoid twisting. Often, the most comfortable positions are those that put less stress on your recovering back.   Find a comfortable position to sleep. Use a firm mattress and lie on your side with your knees slightly bent. If you lie on your back, put a pillow under your knees.   Only take over-the-counter or prescription medicines as directed by your caregiver. Over-the-counter medicines to reduce pain and inflammation are often the most helpful.Your caregiver may prescribe muscle relaxant drugs.These medicines help dull your pain so you can more quickly return to your normal activities and healthy exercise.   Put ice on the injured area.   Put ice in a plastic bag.   Place a towel between your skin and the bag.   Leave the ice on for 15-20 minutes, 3-4 times a day for the first 2 to 3 days. After that, ice and heat may be alternated to reduce pain and spasms.   Ask your caregiver about trying back exercises and gentle massage. This may be of some benefit.   Avoid feeling anxious or stressed.Stress increases muscle tension and can worsen back pain.It is important to recognize when you are anxious or stressed and learn ways to manage it.Exercise is a great option.  SEEK MEDICAL CARE IF:   You have pain that is not relieved with rest or   medicine.   You have pain that does not improve in 1 week.   You have new symptoms.   You are generally not feeling well.  SEEK IMMEDIATE MEDICAL CARE IF:    You have pain that radiates from your back into your legs.   You develop new bowel or bladder control problems.   You have unusual weakness or numbness in your arms or legs.   You develop nausea or vomiting.   You develop abdominal pain.   You feel faint.  Document Released: 10/18/2005 Document Revised: 04/18/2012 Document Reviewed: 03/08/2011  [employer] Patient Information 2014 [employer].

## 2013-05-22 NOTE — ED Provider Notes (Signed)
ED Attending Attestation Note     Date of evaluation: 05/22/2013    This patient was seen by the resident physician.  I have seen and examined the patient, I agree with the workup, evaluation, management and diagnosis. The care plan has been discussed and I concur.  My assessment reveals 32 year old female presenting after a mechanical fall prior to arrival complaining of low back pain.  Tenderness to palpation low back.  We will get x-rays.    Karie Mainland, DO  05/23/13 [phone]

## 2013-05-22 NOTE — ED Notes (Signed)
Patient prepared for and ready to be discharged. Patient discharged at this time in no acute distress after verbalizing understanding of discharge instructions. Patient left after receiving After Visit Summary instructions.     Josem KaufmannJack Mark Benecke, RN  05/22/13 1200

## 2013-05-22 NOTE — ED Provider Notes (Signed)
Date of evaluation: 05/22/2013    Chief Complaint   Fall      Nursing Notes, Past Medical Hx, Past Surgical Hx, Social Hx, Allergies, and Family Hx were all reviewed and agreed with, or any disagreements were addressed in the HPI.    History of Present Illness     [name] [name] is a 32 y.o. female who presents after falling over a curb this afternoon.  The patient was leaving her PT appointment for chronic back pain and tripped over the curb and fell onto her lower back.  She did not strike her head she did not lose consciousness.  She is complaining of low back pain and pain with movement.  She denies weakness in bilateral lower extremities, neck pain, or headache.    Review of Systems     Negative except for as per HPI    Past Medical, Surgical, Family, and Social History         Diagnosis Date   ??? Bipolar affective (HCC)    ??? Paranoia (HCC)    ??? OCD (obsessive compulsive disorder)    ??? Miscarriage    ??? Depression    ??? Anxiety    ??? Gestational diabetes mellitus    ??? Chronic pain    ??? Bipolar affective disorder (HCC)    ??? Chronic back pain greater than 3 months duration    ??? Gestational diabetes mellitus in childbirth      with pregnancy   ??? 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 Cervical disc surgery     ??? Cesarean section     ??? Ectopic pregnancy surgery     ??? Cervical spine surgery       C1-C2 fusion 2003     Her family history includes Arthritis in an unspecified family member; Asthma in an unspecified family member; Cancer in an unspecified family member; Diabetes in an unspecified family member; and Kidney Disease in an unspecified family member.  She reports that she has been smoking Cigarettes.  She has a 5.5 pack-year smoking history. She has never used smokeless tobacco. She reports that  drinks alcohol. She reports that she uses illicit drugs (Cocaine).    Medications     Discharge Medication List as of 05/22/2013 11:37 AM      CONTINUE these medications which  have NOT CHANGED    Details   acetaminophen-codeine (TYLENOL/CODEINE #3) 300-30 MG per tablet Take 1 tablet by mouth every 6 hours.      citalopram (CELEXA) 20 MG tablet Take 40 mg by mouth daily.      traZODone (DESYREL) 150 MG tablet Take 150 mg by mouth nightly.      cyclobenzaprine (FLEXERIL) 10 MG tablet Take 10 mg by mouth 3 times daily as needed for Muscle spasms.      ondansetron (ZOFRAN-ODT) 8 MG disintegrating tablet Take 8 mg by mouth every 8 hours as needed for Nausea or Vomiting.      omeprazole (PRILOSEC) 20 MG capsule Take 20 mg by mouth daily.      glyBURIDE (DIABETA) 2.5 MG tablet Take 2.5 mg by mouth daily (with breakfast).      predniSONE (DELTASONE) 10 MG tablet 4,4,4,3,3,3,2,2,2,1,1,1, Disp-30 tablet, R-0      ibuprofen (IBU) 800 MG tablet Take 1 tablet by mouth every 8 hours as needed for Pain., Disp-20 tablet, R-0      tiZANidine (ZANAFLEX)  4 MG tablet Take 1 tablet by mouth every 6 hours as needed for Pain., Disp-20 tablet, R-0             Allergies     She is allergic to latex; latex; cephalexin; and flagyl.    Physical Exam     INITIAL VITALS: BP 135/90   Pulse 82   Temp(Src) 97.6 ??F (36.4 ??C) (Oral)   Resp 18   Ht 5\' 5"  (1.651 m)   Wt 160 lb (72.576 kg)   BMI 26.63 kg/m2   SpO2 97%   LMP 07/12/2012   Breastfeeding? No   Physical Exam   Constitutional: She is oriented to person, place, and time and well-developed, well-nourished, and in no distress. No distress.   HENT:   Head: Normocephalic and atraumatic.   Eyes: EOM are normal. Pupils are equal, round, and reactive to light.   Neck: Normal range of motion. Neck supple. No thyromegaly present.   Cardiovascular: Normal rate, regular rhythm, normal heart sounds and intact distal pulses.  Exam reveals no gallop and no friction rub.    No murmur heard.  Pulmonary/Chest: Effort normal and breath sounds normal. No respiratory distress. She has no wheezes. She has no rales.   Abdominal: Soft. Bowel sounds are normal. She exhibits no distension.  There is no tenderness. There is no rebound and no guarding.   Musculoskeletal: Normal range of motion. She exhibits no edema.        Lumbar back: She exhibits tenderness, bony tenderness and pain. She exhibits no edema, no deformity and no spasm.   Neurological: She is alert and oriented to person, place, and time. No cranial nerve deficit. Gait normal. GCS score is 15.   Skin: Skin is warm and dry.   Psychiatric: Mood and affect normal.         Diagnostic Results         RADIOLOGY:  XR LUMBAR SPINE STANDARD    Final Result: IMPRESSION:          Unremarkable lumbar spine       RECENT VITALS:  BP: 135/90 mmHg, Temp: 97.6 ??F (36.4 ??C), Pulse: 82, Resp: 18     Procedures         ED Course     The patient was given the following medications:  Orders Placed This Encounter   Medications   ??? oxyCODONE-acetaminophen (PERCOCET) 5-325 MG per tablet 2 tablet     Sig:    ??? DISCONTD: traMADol (ULTRAM) 50 MG tablet     Sig: Take 1 tablet by mouth every 6 hours as needed for Pain for up to 10 days.     Dispense:  20 tablet     Refill:  0   ??? naproxen (NAPROSYN) 500 MG tablet     Sig: Take 1 tablet by mouth 2 times daily (with meals).     Dispense:  30 tablet     Refill:  0       CONSULTS:  None    MEDICAL DECISION MAKING     [name] [name] is a 32 y.o. female who fell down on the curb earlier today.  She did not have any evidence of fracture on her exam or on her x-ray.  She did have tenderness to palpation where she fell.  No weakness concerning for central cord syndrome.  She was sent home with instructions to follow-up with her PT OT continue this as this was the best way to get rid  of her chronic back pain.    This patient was also evaluated by the attending physician. All care plans were discussed and agreed upon.    Clinical Impression     1. Fall from other slipping, tripping, or stumbling        Disposition/Plan     PATIENT REFERRED TO:  Madelaine Bhat          [facility]  [address]  [place] [address]  [phone]    As needed, If symptoms worsen      DISCHARGE MEDICATIONS:  Discharge Medication List as of 05/22/2013 11:37 AM      START taking these medications    Details   naproxen (NAPROSYN) 500 MG tablet Take 1 tablet by mouth 2 times daily (with meals)., Disp-30 tablet, R-0             DISPOSITION Decision to Discharge    (Please note that portions of this note were completed with voice recognition software.  Efforts were made to edit the dictations but occasionally words are mis-transcribed.)      Lanice Shirts, MD  Resident  05/22/13 [phone]

## 2013-05-25 ENCOUNTER — Inpatient Hospital Stay: Admit: 2013-05-25 | Discharge: 2013-05-26 | Discharge status: Against Medical Advice | Payer: MEDICAID

## 2013-05-25 NOTE — Unmapped (Signed)
Pt is complaining of back pain that has been going on for a couple of years, but has increased over the past week.

## 2013-10-19 ENCOUNTER — Inpatient Hospital Stay: Admit: 2013-10-19 | Discharge: 2013-10-19 | Attending: Emergency Medicine

## 2013-10-19 MED ORDER — NAPROXEN 500 MG PO TABS
500 MG | ORAL_TABLET | Freq: Two times a day (BID) | ORAL | Status: DC
Start: 2013-10-19 — End: 2014-08-11

## 2013-10-19 MED ORDER — KETOROLAC TROMETHAMINE 60 MG/2ML IJ SOLN
60 MG/2ML | Freq: Once | INTRAMUSCULAR | Status: AC
Start: 2013-10-19 — End: 2013-10-19
  Administered 2013-10-19: 22:00:00 via INTRAMUSCULAR

## 2013-10-19 MED ORDER — CYCLOBENZAPRINE HCL 10 MG PO TABS
10 MG | Freq: Once | ORAL | Status: AC
Start: 2013-10-19 — End: 2013-10-19
  Administered 2013-10-19: 22:00:00 via ORAL

## 2013-10-19 MED ORDER — CYCLOBENZAPRINE HCL 10 MG PO TABS
10 MG | ORAL_TABLET | Freq: Two times a day (BID) | ORAL | Status: DC | PRN
Start: 2013-10-19 — End: 2015-05-22

## 2013-10-19 MED FILL — CYCLOBENZAPRINE HCL 10 MG PO TABS: 10 MG | ORAL | Qty: 1

## 2013-10-19 MED FILL — KETOROLAC TROMETHAMINE 60 MG/2ML IJ SOLN: 60 MG/2ML | INTRAMUSCULAR | Qty: 2

## 2013-10-19 NOTE — ED Notes (Signed)
Hx of chronic back pain due to injury and fusion of C1 C2 in May of 2004. C/O pain to entire back and headache for the last 2 days. Ambulatory to ER. Gait even.     Lizabeth Leyden, RN  10/19/13 [phone]

## 2013-10-19 NOTE — ED Provider Notes (Signed)
[facility]     eMERGENCY dEPARTMENT eNCOUnter   [facility]      Pt Name: [name] [name]  MRN: [id number]  Birthdate Apr 29, 1981  Date of evaluation: 10/19/2013    Chief Complaint   Back Pain      Nursing Notes, Past Medical Hx, Past Surgical Hx, Social Hx, Allergies, and Family Hx were all reviewed and agreed with, or any disagreements were addressed in the HPI.    History of Present Illness       [name] [name] is a 32 y.o. female who presents to the emergency department with complaints of neck and back pain.  The patient states that she has a history of chronic neck and back pain.  She states that she used to be followed by a pain management specialist but has not seen a physician since 2012.  She states she [facility] goes to [facility] but is currently staying at her mother's in the neighborhood.  She states the pain is worsened with changes in the weather.  She states that the pain starts in her neck and radiates down throughout her back.  She states that she had a fusion of C1/C2 in 2004.  She states she has been taking ibuprofen with minimal relief.  She denies any history of trauma, fall, or injury.  She denies any numbness or tingling.  No loss of bowel or bladder control.  No weakness reported.  No history of fevers.  No urinary symptoms.  No vomiting.  No complaints of abdominal pain.    Review of Systems     Negative for bowel or bladder incontinence.    Negative for paresthesias or weakness.    See HPI for further details. Review of systems otherwise negative.      Medications     Previous Medications    CITALOPRAM (CELEXA) 20 MG TABLET    Take 40 mg by mouth daily.    DIAZEPAM (VALIUM) 5 MG TABLET    Take 5 mg by mouth every 6 hours as needed for Anxiety.    GLYBURIDE (DIABETA) 2.5 MG TABLET    Take 2.5 mg by mouth daily (with breakfast).    OMEPRAZOLE (PRILOSEC) 20 MG CAPSULE    Take 20 mg by mouth daily.    TRAZODONE (DESYREL) 150 MG TABLET    Take  150 mg by mouth nightly.       Allergies     She is allergic to latex; latex; cephalexin; and flagyl.    Physical Exam      INITIAL VITALS: BP 136/80   Pulse 76   Temp(Src) 98.6 ??F (37 ??C) (Oral)   Resp 18   Ht 5\' 5"  (1.651 m)   Wt 58.968 kg (130 lb)   BMI 21.63 kg/m2   SpO2 100%   LMP 07/07/2012   Constitutional:  Well developed female sitting up comfortably.  Neck:  Normal range of motion, no tenderness, supple   GU:  No costovertebral angle tenderness   Musculoskeletal:  No edema, no tenderness, no deformities. Straight leg raise Negative bilaterally  Back:  No midline spine tenderness, tenderness of the paraspinal muscles in the cervical, thoracic, and lower lumbar region.  Integument:  Well hydrated, no rash, no abrasion or bruising  Neurologic:  Motor, sensory, and reflex exams normal in both lower extremities     Diagnostic Results     LABS:   Labs Reviewed - No data to display    ED Course  The patient was given the following medications:  Orders Placed This Encounter   Medications   ??? ketorolac (TORADOL) injection 60 mg     Sig:    ??? cyclobenzaprine (FLEXERIL) tablet 10 mg     Sig:    ??? naproxen (NAPROSYN) 500 MG tablet     Sig: Take 1 tablet by mouth 2 times daily.     Dispense:  30 tablet     Refill:  0   ??? cyclobenzaprine (FLEXERIL) 10 MG tablet     Sig: Take 1 tablet by mouth 2 times daily as needed for Muscle spasms.     Dispense:  10 tablet     Refill:  0     Medical Decision Making      Pertinent studies reviewed. (See chart for details)  The patient has been seen and evaluated for the complaint of back pain.  The patient denies recent trauma. The patient denies urinary incontinence, urinary retention, and numbness in the extremities.  The patient is neurologically intact, sensation is intact in the lower extremities, dorsalis pedal pulses are 2+ bilaterally, and achilles reflexes are intact bilaterally. I estimate there is LOW risk for ABDOMINAL AORTIC ANEURYSM, CAUDA EQUINA SYNDROME, EPIDURAL  MASS LESION, OR CORD COMPRESSION, thus I consider the discharge disposition reasonable. The patient and/or family and I have discussed the diagnosis and risks, and we agree with discharging home to follow-up with their primary doctor. We also discussed returning to the Emergency Department immediately if new or worsening symptoms occur. We have discussed the symptoms which are most concerning (e.g., saddle anesthesia, urinary or bowel incontinence or retention, changing or worsening pain) that necessitate immediate return.    Disposition     CLINICAL IMPRESSION:  1. Chronic back pain        PATIENT REFERRED TO:  Madelaine Bhat            DISCHARGE MEDICATIONS:  New Prescriptions    CYCLOBENZAPRINE (FLEXERIL) 10 MG TABLET    Take 1 tablet by mouth 2 times daily as needed for Muscle spasms.    NAPROXEN (NAPROSYN) 500 MG TABLET    Take 1 tablet by mouth 2 times daily.       DISPOSITION:  Decision to Discharge    (Please note that portions of this note were completed with a voice recognition program.  Efforts were made to edit the dictations but occasionally words are mis-transcribed.)        Valera Castle, MD  10/19/13 1659

## 2013-10-19 NOTE — Discharge Instructions (Signed)
Back Pain: After Your Visit  Your Care Instructions     Back pain has many possible causes. It is often related to problems with muscles and ligaments of the back. It may also be related to problems with the nerves, discs, or bones of the back. Moving, lifting, standing, sitting, or sleeping in an awkward way can strain the back. Sometimes you don't notice the injury until later. Arthritis is another common cause of back pain.  Although it may hurt a lot, back pain usually improves on its own within several weeks. Most people recover in 12 weeks or less. Using good home treatment and being careful not to stress your back can help you feel better sooner.  Follow-up care is a key part of your treatment and safety. Be sure to make and go to all appointments, and call your doctor if you are having problems. It's also a good idea to know your test results and keep a list of the medicines you take.  How can you care for yourself at home?   Sit or lie in positions that are most comfortable and reduce your pain. Try one of these positions when you lie down:   Lie on your back with your knees bent and supported by large pillows.   Lie on the floor with your legs on the seat of a sofa or chair.   Lie on your side with your knees and hips bent and a pillow between your legs.   Lie on your stomach if it does not make pain worse.   Do not sit up in bed, and avoid soft couches and twisted positions. Bed rest can help relieve pain at first, but it delays healing. Avoid bed rest after the first day of back pain.   Change positions every 30 minutes. If you must sit for long periods of time, take breaks from sitting. Get up and walk around, or lie in a comfortable position.   Try using a heating pad on a low or medium setting for 15 to 20 minutes every 2 or 3 hours. Try a warm shower in place of one session with the heating pad.   You can also try an ice pack for 10 to 15 minutes every 2 to 3 hours. Put a thin cloth  between the ice pack and your skin.   Take pain medicines exactly as directed.   If the doctor gave you a prescription medicine for pain, take it as prescribed.   If you are not taking a prescription pain medicine, ask your doctor if you can take an over-the-counter medicine.   Take short walks several times a day. You can start with 5 to 10 minutes, 3 or 4 times a day, and work up to longer walks. Walk on level surfaces and avoid hills and stairs until your back is better.   Return to work and other activities as soon as you can. Continued rest without activity is usually not good for your back.   To prevent future back pain, do exercises to stretch and strengthen your back and stomach. Learn how to use good posture, safe lifting techniques, and proper body mechanics.  When should you call for help?  Call your doctor now or seek immediate medical care if:   You have new or worsening numbness in your legs.   You have new or worsening weakness in your legs. (This could make it hard to stand up.)   You lose control of your bladder or   bowels.  Watch closely for changes in your health, and be sure to contact your doctor if:   Your pain gets worse.   You are not getting better after 2 weeks.   Where can you learn more?   Go to [email or web address] and sign in to your MyChart account. Enter I594 in the Search Health Information box to learn more about "Back Pain: After Your Visit."    If you do not have an account, please click on the "Sign Up Now" link.      2006-2014 Healthwise, Incorporated. Care instructions adapted under license by [facility]. This care instruction is for use with your licensed healthcare professional. If you have questions about a medical condition or this instruction, always ask your healthcare professional. Healthwise, Incorporated disclaims any warranty or liability for your use of this information.  Content Version: 10.3.368381; Current as of: April 04, 2013                Learning About How to Have a Healthy Back  What causes back pain?     Back pain is often caused by overuse, strain, or injury. For example, people often hurt their backs playing sports or working in the yard, being jolted in a car accident, or lifting something too heavy.  Aging plays a part too. Your bones and muscles tend to lose strength as you age, which makes injury more likely. The spongy discs between the bones of the spine (vertebrae) may suffer from wear and tear and no longer provide enough cushion between the bones. A disc that bulges or breaks open (herniated disc) can press on nerves, causing back pain.  In some people, back pain is the result of arthritis, broken vertebrae caused by bone loss (osteoporosis), illness, or a spine problem.  Although most people have back pain at one time or another, there are steps you can take to make it less likely.  How can you have a healthy back?  Reduce stress on your back through good posture   Slumping or slouching alone may not cause low back pain. But after the back has been strained or injured, bad posture can make pain worse.   Sleep in a position that maintains your back's normal curves and on a mattress that feels comfortable. Sleep on your side with a pillow between your knees, or sleep on your back with a pillow under your knees. These positions can reduce strain on your back.   Stand and sit up straight. "Good posture" generally means your ears, shoulders, and hips are in a straight line.   If you must stand for a long time, put one foot on a stool, ledge, or box. Switch feet every now and then.   Sit in a chair that is low enough to let you place both feet flat on the floor with both knees nearly level with your hips. If your chair or desk is too high, use a footrest to raise your knees. Place a small pillow, a rolled-up towel, or a lumbar roll in the curve of your back if you need extra support.   Try a kneeling chair, which  helps tilt your hips forward. This takes pressure off your lower back.   Try sitting on an exercise ball. It can rock from side to side, which helps keep your back loose.   When driving, keep your knees nearly level with your hips. Sit straight, and drive with both hands on the steering wheel. Your arms should   be in a slightly bent position.  Reduce stress on your back through careful lifting    Squat down, bending at the hips and knees only. If you need to, put one knee to the floor and extend your other knee in front of you, bent at a right angle (half kneeling).   Press your chest straight forward. This helps keep your upper back straight while keeping a slight arch in your low back.   Hold the load as close to your body as possible, at the level of your belly button (navel).   Use your feet to change direction, taking small steps.   Lead with your hips as you change direction. Keep your shoulders in line with your hips as you move.   Set down your load carefully, squatting with your knees and hips only.  Exercise and stretch your back    Do some exercise on most days of the week, if your doctor says it is okay. You can walk, run, swim, or cycle.   Stretch your back muscles. Here are a few exercises to try:   Lie on your back, and gently pull one bent knee to your chest. Put that foot back on the floor, and then pull the other knee to your chest.   Do pelvic tilts. Lie on your back with your knees bent. Tighten your stomach muscles. Pull your belly button (navel) in and up toward your ribs. You should feel like your back is pressing to the floor and your hips and pelvis are slightly lifting off the floor. Hold for 6 seconds while breathing smoothly.   Sit with your back flat against a wall.   Keep your core muscles strong. The muscles of your back, belly (abdomen), and buttocks support your spine.   Pull in your belly and imagine pulling your navel toward your spine. Hold this for 6 seconds, then  relax. Remember to keep breathing normally as you tense your muscles.   Do curl-ups. Always do them with your knees bent. Keep your low back on the floor, and curl your shoulders toward your knees using a smooth, slow motion. Keep your arms folded across your chest. If this bothers your neck, try putting your hands behind your neck (not your head), with your elbows spread apart.   Lie on your back with your knees bent and your feet flat on the floor. Tighten your belly muscles, and then push with your feet and raise your buttocks up a few inches. Hold this position 6 seconds as you continue to breathe normally, then lower yourself slowly to the floor. Repeat 8 to 12 times.   If you like group exercise, try Pilates or yoga. These classes have poses that strengthen the core muscles.  Lead a healthy lifestyle    Stay at a healthy weight to avoid strain on your back.   Do not smoke. Smoking increases the risk of osteoporosis, which weakens the spine. If you need help quitting, talk to your doctor about stop-smoking programs and medicines. These can increase your chances of quitting for good.   Where can you learn more?   Go to [email or web address] and sign in to your MyChart account. Enter L315 in the Search Health Information box to learn more about "Learning About How to Have a Healthy Back."    If you do not have an account, please click on the "Sign Up Now" link.      2006-2014 Healthwise, Incorporated. Care instructions adapted under license by [facility]. This care instruction is for use with your licensed healthcare professional. If you have questions about a medical condition or this instruction, always ask your healthcare professional. Healthwise, Incorporated disclaims any warranty or liability for your use of this information.  Content Version: 10.3.368381; Current as of: June 11, 2013

## 2014-01-18 NOTE — Telephone Encounter (Signed)
Name: Lindley Stachnik    DOB: October 25, 1981  Payor: Ferdinand Cava /  /  /   Referring: [facility]  Visit Type: C  Where is the pain?: NECK AND HEAD- C1 C2 FUSION MAY 2004  Are you interested in a epidural injection: Y  Have you seen a pain management doctor in the past:   Previous Injections: Y  Current Pain medications: TYLENOL 3 1-2 Q4-6H, NEURONTIN 300 MG QID  MRI: [facility]  CT:  EMG:  COMMENTS:

## 2014-01-25 NOTE — Telephone Encounter (Signed)
Pt saw Dr. Deloria Lair once and he told her he couldn't help her, states [facility] referred her to them and [place]. Informed wen eed [facility] info.

## 2014-01-29 NOTE — Progress Notes (Signed)
[facility]  [address] ??? [address] ??? [place], [place] [address] ??? Phone [phone] ??? Fax [phone]  [address] ??? [place] ??? [place], [place]  [place] ??? [address] ??? [place], [place]     Desma Paganini, MD, FIPP Evelina Bucy, MSN, CNP  Fellow of Interventional Pain Practice  Board Certified in Pain Management   Board Certified in Anesthesia      January 23, 2014      [place]        Date faxed:  January 24, 2014  [phone]   Madelyn Flavors, M.D.  [address], [place]  [address]    RE:  [name] [name], D.O.B.:  1981-01-01    Dear Dr. Gorden Harms:    Thank you for consulting [facility] regarding [name] [name].  As you are aware, the patient is a 33 year old female with the chief complaint of pain in her neck from C1 to C7 and it goes to both shoulders and shoulder blades and down to the elbows equally on both sides.  The pain also goes up in occipitotemporal distribution.  She has numbness, tingling, and weakness in both arms.  Her pain started in November 2003 after an accident.  She underwent a fusion in 2004 by you, Dr. Gorden Harms.  Onset of pain was sudden and pain is constant.  She describes the pain as aching.    FACTORS THAT MAKE THE PAIN BETTER:  Pain medication.     FACTORS THAT MAKE THE PAIN WORSE:  Damp and rainy weather.    MODALITIES DONE SO FAR FOR PAIN MANAGEMENT:  Please see history and plan below for details.    INVESTIGATIONS:  MRI scan of the cervical spine and OARRS report were reviewed.     The pain does wake her up at night and does make her depressed.  On a depression scale it is 9/10.  She has no suicidal ideation or behaviors.  On a pain scale of 0 to 10, at its worst it is 9/10.  The patient is able to function only 70% of her full function capability.  Currently the patient is working part time and is applying for disability.  There is a settled lawsuit regarding this  pain problem.      REVIEW OF SYSTEMS: Constitutional:  No recent weight loss or fevers.  Neurological:  She has numbness, tingling, and weakness in both arms.  Genitourinary/Gastrointestinal:  The patient denies any accidents of the bladder or bowel.      PAST MEDICAL HISTORY:  Enlarged pituitary gland, diabetes, depression.    PAST SURGICAL HISTORY:  C1-C2 fusion.     MEDICATIONS:  List is noted in the chart.  Of importance are Celexa, trazodone, and Valium.     ALLERGIES:  Latex, Keflex, Flagyl.     SOCIAL HISTORY: The patient smokes tobacco but does not smoke marijuana, consume alcohol, or abuse drugs.  There is no history of alcohol abuse.  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     PHYSICAL EXAMINATION:  The patient was well developed.  She appeared to be in pain.  Appearance was consistent with stated age.  The patient???s build was average.  The patient was oriented to time, place, and person and did not appear to be depressed, anxious, agitated, or intoxicated.  Examination of the cervical spine did not  reveal any scars, scoliosis, or kyphosis.  Neck was supple and without lymphadenopathy or swellings.  Tenderness was seen in the paraspinal muscles, facets, and trapezius muscles.  There was extremely decreased range of motion of the neck.  She had poor effort during motor strength testing.  No sensory deficits could be appreciated in the upper extremities.  Gait was normal.    ASSESSMENT:  Neck pain with radiculopathy.    PLAN:  1. Regarding opioids, Tylenol No. 3 is not helping even though she is taking one to two q.4-6h.  She said she needs hydrocodone or oxycodone up to six or eight a day to be comfortable.  She has a lot of risk factors and I do not think opioids are in her best interest.  I counseled her against opioid use but she was insisting she needs them, so I gave her a list of other pain specialists.   2. She is on Celexa, trazodone, and Valium.   3. She is not  too keen on interventional therapies.  4. Surgical options through Dr. Gorden Harms.   5. She failed physical therapy.  6. I doubt that chiropractic care would help.  7. Insurance will not pay for a TENS unit.  8. I will see her back as needed for non-opioid modalities.    I hope you find this information useful.  If you have any questions or concerns, please feel free to call me.  Thank you for letting us be a part of [name] [name]???s pain management.    Sincerely,      Desma Paganini, M.D., F.I.P.P.   Triple Board Certified in Pain Management     SA:cah/DICTATED BUT NOT READ

## 2014-05-14 NOTE — Telephone Encounter (Signed)
Information received from [facility].  Pt has [facility].  Cannot accept as patient since not referral by a [place]. Referral sent back to [facility] with explanation.

## 2014-07-21 ENCOUNTER — Inpatient Hospital Stay: Admit: 2014-07-21 | Discharge: 2014-07-21 | Attending: Emergency Medicine

## 2014-07-21 MED ORDER — MORPHINE SULFATE (PF) 2 MG/ML IV SOLN
2 MG/ML | Freq: Once | INTRAVENOUS | Status: AC
Start: 2014-07-21 — End: 2014-07-21
  Administered 2014-07-21: 10:00:00 2 mg via INTRAMUSCULAR

## 2014-07-21 MED FILL — MORPHINE SULFATE (PF) 2 MG/ML IV SOLN: 2 mg/mL | INTRAVENOUS | Qty: 1

## 2014-07-21 NOTE — Discharge Instructions (Signed)
Sciatica: Care Instructions  Your Care Instructions     Sciatica (say "sye-AT-ih-kuh") is an irritation of one of the sciatic nerves, which come from the spinal cord in the lower back. The sciatic nerves and their branches extend down through the buttock to the foot. Sciatica can develop when an injured disc in the back presses against a spinal nerve root. Its main symptom is pain, numbness, or weakness that is often worse in the leg or foot than in the back.  Sciatica often will improve and go away with time. Early treatment usually includes medicines and exercises to relieve pain.  Follow-up care is a key part of your treatment and safety. Be sure to make and go to all appointments, and call your doctor if you are having problems. It's also a good idea to know your test results and keep a list of the medicines you take.  How can you care for yourself at home?   Take pain medicines exactly as directed.   If the doctor gave you a prescription medicine for pain, take it as prescribed.   If you are not taking a prescription pain medicine, ask your doctor if you can take an over-the-counter medicine.   Use heat or ice to relieve pain.   To apply heat, put a warm water bottle, heating pad set on low, or warm cloth on your back. Do not go to sleep with a heating pad on your skin.   To use ice, put ice or a cold pack on the area for 10 to 20 minutes at a time. Put a thin cloth between the ice and your skin.   Avoid sitting if possible, unless it feels better than standing.   Alternate lying down with short walks. Increase your walking distance as you are able to without making your symptoms worse.   Do not do anything that makes your symptoms worse.  When should you call for help?  Call 911 anytime you think you may need emergency care. For example, call if:   You are unable to move a leg at all.  Call your doctor now or seek immediate medical care if:   You have new or worse symptoms in your legs or  buttocks. Symptoms may include:   Numbness or tingling.   Weakness.   Pain.   You lose bladder or bowel control.  Watch closely for changes in your health, and be sure to contact your doctor if:   You are not getting better as expected.   Where can you learn more?   Go to [email or web address] and sign in to your MyChart account. Enter Z239 in the [place] box to learn more about "Sciatica: Care Instructions."    If you do not have an account, please click on the "Sign Up Now" link.      2006-2015 Healthwise, Incorporated. Care instructions adapted under license by [facility]. This care instruction is for use with your licensed healthcare professional. If you have questions about a medical condition or this instruction, always ask your healthcare professional. [place] any warranty or liability for your use of this information.  Content Version: 10.6.465758; Current as of: Mar 22, 2014                Elevated Blood Pressure: Care Instructions  Your Care Instructions     Blood pressure is a measure of how hard the blood pushes against the walls of your arteries. It's normal for blood pressure to  go up and down throughout the day. But if it stays up over time, you have high blood pressure.  Two numbers tell you your blood pressure. The first number is the systolic pressure. It shows how hard the blood pushes when your heart is pumping. The second number is the diastolic pressure. It shows how hard the blood pushes between heartbeats, when your heart is relaxed and filling with blood. An ideal blood pressure in adults is less than 120/80 (say "120 over 80"). High blood pressure is 140/90 or higher.  The main test for high blood pressure is simple, fast, and painless. To diagnose high blood pressure, your doctor will test your blood pressure at different times. You may have to check your blood pressure at home if there is reason to think that the  results in the doctor's office aren't accurate.  If you are diagnosed with high blood pressure, you can work with your doctor to make a long-term plan to manage it.  Follow-up care is a key part of your treatment and safety. Be sure to make and go to all appointments, and call your doctor if you are having problems. It's also a good idea to know your test results and keep a list of the medicines you take.  How can you care for yourself at home?   Do not smoke. Smoking increases your risk for heart attack and stroke. If you need help quitting, talk to your doctor about stop-smoking programs and medicines. These can increase your chances of quitting for good.   Stay at a healthy weight.   Try to limit how much sodium you eat to less than 2,300 milligrams (mg) a day. Your doctor may ask you to try to eat less than 1,500 mg a day.   Be physically active. Get at least 30 minutes of exercise on most days of the week. Walking is a good choice. You also may want to do other activities, such as running, swimming, cycling, or playing tennis or team sports.   Avoid or limit alcohol. Talk to your doctor about whether you can drink any alcohol.   Eat plenty of fruits, vegetables, and low-fat dairy products. Eat less saturated and total fats.   Learn how to check your blood pressure at home.  When should you call for help?  Call your doctor now or seek immediate medical care if:   Your blood pressure is much higher than normal (such as 180/110 or higher).   You think high blood pressure is causing symptoms such as:   Severe headache.   Blurry vision.  Watch closely for changes in your health, and be sure to contact your doctor if:   You do not get better as expected.   Where can you learn more?   Go to [email or web address] and sign in to your MyChart account. Enter [id number] in the [place] box to learn more about "Elevated Blood Pressure: Care Instructions."    If you do not have an  account, please click on the "Sign Up Now" link.      2006-2015 Healthwise, Incorporated. Care instructions adapted under license by [facility]. This care instruction is for use with your licensed healthcare professional. If you have questions about a medical condition or this instruction, always ask your healthcare professional. [place] any warranty or liability for your use of this information.  Content Version: 10.6.465758; Current as of: February 11, 2014

## 2014-07-21 NOTE — ED Notes (Signed)
States she has been having pain in both legs and feet for 3 weeks. Denies injury. States she is taking Vicodin, Flexeril, and Neurontin for pain. Being followed at [facility] clinic for some spinal issues.     Annamary Carolin, RN  07/21/14 [phone]

## 2014-07-21 NOTE — ED Provider Notes (Signed)
The history is provided by the patient and medical records.       [facility]  eMERGENCY dEPARTMENT eNCOUnter      Pt Name: [name] [name]  MRN: [id number]  [place] Nov 26, 1980  Date of evaluation: 07/21/2014  Provider: Oswaldo Conroy, MD    CHIEF COMPLAINT       Chief Complaint   Patient presents with   ??? Leg Pain   ??? Foot Pain       CRITICAL CARE TIME         HISTORY OF PRESENT ILLNESS  (Location/Symptom, Timing/Onset, Context/Setting, Quality, Duration, Modifying Factors, Severity.)   [name] [name] is a 33 y.o. female who presents to the emergency department with burning tingling pain in both feet and legs. Patient denies any history of IV drug use and has or malignancy or HIV or hepatitis who has diabetes but she says her sugars have been in the mid 100s with no recent falls or any other trauma who has issues with lower back pain for years with radiation of the pain to her legs with tingling and burning sensation in her legs has had MRIs in the past she says she's had injections for herniated lumbar disks in the past but not for several years and injections worked for the radiating pain going to her legs with burning and tingling sensation in her feet and toes has been present for years but worked only temporarily and I reviewed her records she's had multiple imaging of her cervical spine of her lumbar spine including she just recently had flexion extension x-rays of her lumbar spine which were unremarkable and the reason patient is coming to the emergency department because she says she's having a hard time sleeping because she continues to have a moderate to severe in intensity burning shooting pain going from both gluteal regions down to her feet bilaterally with tingling burning sensation in her feet and toes only partially controlled with her muscle relaxants and pain medications at home and the pain is identical to the chronic pain she's been having with radiation for a number of years  in the pain goes from her lower back and bilateral gluteal regions down the lateral aspect of both legs all the way down to her feet and toes and reason she came in is because it is effecting her sleep - keeping her legs elevated or lowered does not effect it. No calf pain no leg swelling no weakness in the extremities no numbness in extremities just tingling and burning sensation in her feet and toes. No incontinence no fevers no shaking chills no flank pain no abdominal pain no chest pain no shortness of breath no recent surgical procedures or long plane flights or prolonged immobilization no hemoptysis no history of PE or DVT. Pain is not affected by walking or exertion. Sometimes is exacerbated by turning her back a certain way. No dysuria no hematuria no dyspareunia and no pelvic pain. No radiation of the pain to her abdomen flanks or chest. In addition to Vicodin or Flexeril she is also on Neurontin for this pain.       Nursing Notes were reviewed.        PAST MEDICAL HISTORY         Diagnosis Date   ??? Bipolar affective ([place])    ??? Paranoia ([place])    ??? OCD (obsessive compulsive disorder)    ??? Miscarriage    ??? Depression    ??? Anxiety    ???  Gestational diabetes mellitus    ??? Chronic pain    ??? Bipolar affective disorder ([place])    ??? Chronic back pain greater than 3 months duration    ??? Gestational diabetes mellitus in childbirth      with pregnancy   ??? Herniated disc      lumbar 3-4-5       SURGICAL HISTORY           Procedure Laterality Date   ??? Cervical disc surgery     ??? Cesarean section     ??? Ectopic pregnancy surgery     ??? Cervical spine surgery       C1-C2 fusion 2003       CURRENT MEDICATIONS       Previous Medications    CITALOPRAM (CELEXA) 20 MG TABLET    Take 40 mg by mouth daily.    CYCLOBENZAPRINE (FLEXERIL) 10 MG TABLET    Take 1 tablet by mouth 2 times daily as needed for Muscle spasms.    DIAZEPAM (VALIUM) 5 MG TABLET    Take 5 mg by mouth every 6 hours as needed for Anxiety.    GABAPENTIN (NEURONTIN  PO)    Take by mouth    GLYBURIDE (DIABETA) 2.5 MG TABLET    Take 2.5 mg by mouth daily (with breakfast).    HYDROCODONE-ACETAMINOPHEN (NORCO) 5-325 MG PER TABLET    Take 1 tablet by mouth every 6 hours as needed for Pain    NAPROXEN (NAPROSYN) 500 MG TABLET    Take 1 tablet by mouth 2 times daily.    OMEPRAZOLE (PRILOSEC) 20 MG CAPSULE    Take 20 mg by mouth daily.    TRAZODONE (DESYREL) 150 MG TABLET    Take 150 mg by mouth nightly.       ALLERGIES     Latex; Latex; Cephalexin; and Flagyl    FAMILY HISTORY           Problem Relation Age of Onset   ??? Arthritis     ??? Asthma     ??? Cancer     ??? Diabetes     ??? Kidney Disease       Family Status   Relation Status Death Age   ??? Mother Alive    ??? Father Alive         SOCIAL HISTORY      reports that she has been smoking Cigarettes.  She has a 5.5 pack-year smoking history. She has never used smokeless tobacco. She reports that she drinks alcohol. She reports that she uses illicit drugs (Cocaine).        Review of Systems   Constitutional: Negative for fever and chills.   Respiratory: Negative for shortness of breath.    Cardiovascular: Negative for chest pain.   Gastrointestinal: Negative for vomiting and abdominal pain.   Genitourinary: Negative for dysuria, flank pain, vaginal pain and pelvic pain.   Musculoskeletal: Positive for back pain.   Neurological: Negative for weakness and numbness.   All other systems reviewed and are negative.      Physical Exam   Constitutional: She is oriented to person, place, and time. She is cooperative.  Non-toxic appearance. She does not have a sickly appearance. She does not appear ill. No distress.   HENT:   Head: Normocephalic and atraumatic. Head is without abrasion, without contusion and without laceration.   Nose: No nasal deformity.   Mouth/Throat: Oropharynx is clear and moist and mucous membranes are normal.  No oropharyngeal exudate.   Eyes: Conjunctivae and EOM are normal. Pupils are equal, round, and reactive to light. No  scleral icterus.   Neck: Normal range of motion. Neck supple. No JVD present. No tracheal deviation, no edema and no erythema present. No thyroid mass and no thyromegaly present.   No midline cervical spine tenderness no paraspinal neck tenderness normal range of motion of the neck on her own with flexion extension rotation without discomfort   Cardiovascular: Normal rate, regular rhythm, S1 normal, S2 normal, normal heart sounds and intact distal pulses.  Exam reveals no gallop, no distant heart sounds and no friction rub.    No murmur heard.  Pulmonary/Chest: Effort normal and breath sounds normal. No accessory muscle usage or stridor. No respiratory distress. She has no decreased breath sounds. She has no wheezes. She has no rhonchi. She has no rales. She exhibits no tenderness, no bony tenderness, no deformity and no retraction.   Abdominal: Soft. Normal appearance and bowel sounds are normal. She exhibits no distension, no ascites, no pulsatile midline mass and no mass. There is no tenderness. There is no rigidity, no rebound, no guarding and no CVA tenderness. No hernia.   No pulsatile abdominal masses strong equal bilateral femoral pulses   Musculoskeletal: Normal range of motion. She exhibits no edema or tenderness.   No midline or paraspinal thoracic or lumbar back tenderness or step-offs the patient does have discomfort with flexion and rotation of her lower back. No true positive straight leg but she does have tightness in her bilateral gluteal regions and lower back with lowering down of both legs from a 45?? angle.  Normal strength and sensation proximally and distally in arms and legs normoactive deep tendon reflexes at triceps wrist patellas and Achilles bilaterally. Stable gait no saddle anesthesia she has strong femoral popliteal dorsalis pedis posterior tibial and anterior tibial pulses bilaterally no lower extremity edema calves are soft and nontender with no palpable cords and negative Homans sign  bilaterally normal color and warmth of her toes and feet bilaterally with less than 2 second capillary refill at the tips of her toes bilaterally   Lymphadenopathy:     She has no cervical adenopathy.   Neurological: She is alert and oriented to person, place, and time. She has normal strength. No cranial nerve deficit or sensory deficit. Coordination and gait normal. GCS eye subscore is 4. GCS verbal subscore is 5. GCS motor subscore is 6.   Skin: Skin is warm and dry. No rash noted. She is not diaphoretic. No pallor.   Psychiatric: She has a normal mood and affect. Her behavior is normal. Judgment and thought content normal.   Nursing note and vitals reviewed.      Procedures    MDM      DIAGNOSTIC RESULTS     EKG: All EKG's are interpreted by the Emergency Department Physician who either signs or Co-signs this chart in the absence of a cardiologist.        RADIOLOGY:   Non-plain film images such as CT, Ultrasound and MRI are read by the radiologist. Plain radiographic images are visualized and preliminarily interpreted by the emergency physician with the below findings:        Interpretation per the Radiologist below, if available at the time of this note:           ED BEDSIDE ULTRASOUND:   Performed by ED Physician - none    LABS:  Labs Reviewed - No data  to display    All other labs were within normal range or not returned as of this dictation.    EMERGENCY DEPARTMENT COURSE and DIFFERENTIAL DIAGNOSIS/MDM:   Vitals:    Filed Vitals:    07/21/14 6045 07/21/14 0618   BP:  141/87   Pulse: 93    Temp: 98.3 ??F (36.8 ??C)    Resp: 18    SpO2: 100%        Patient's exam presentation is consistent with her chronic sciatica type symptoms. Exam presentation is not suggestive of cauda equina or epidural abscess or discitis or aortic syndrome pyelonephritis or ureterolithiasis or perforated viscus or acute cholecystitis or appendicitis or colitis or diverticulitis or obstruction or DVT or acute arterial insufficiency in her  lower extremities. Based on her exam presentation history and prior workup including a recent imaging she has no indications for emergent imaging in the ED. She is neurovascular intact in her extremities and she is neurologically intact. Provided the patient dose of pain medications here and it did improve her radiating lower back and gluteal pain going to her lower extremities. On reevaluation prior to discharge she was ambulating with a steady stable gait and was neurologically intact. She says she has an appointment with the pain management physicians but is not for a month from now and at the same physicians who previously done injections on her that helped her with this pain. She follows up with her primary care physician and clinic for this already. I also provided her with orthopedic follow-up information. She has pain medications and muscle relaxants at home. I discharge the patient home advised to follow for revaluation with her primary care physician within 48 hours also to follow for revaluation with orthopedics within a week and discuss with her PMD and with orthopedics possibility of a repeat outpatient MRI and she has had physical therapy in the past and she there currently arranging physical therapy for her again for the symptoms. I advised patient to return to the emergent immediately if she gets fever shaking chills or blood sugars are persistently out of control she develops abdominal pain flank pain chest pain shortness of breath develops leg swelling or discoloration develops calf pai or swellin has weakness in extremities has significant numbness in extremities has problems ambulating has any incontinence develops worsening back pain has radiation of the pain to her chest abdomen or flanks or she in general feels worse. Patient agreeable with this plan.    Pre-hypertension/Hypertension: The patient has been informed that they may have pre-hypertension or Hypertension based on a blood pressure  reading in the emergency department. I recommend that the patient call the primary care provider listed on their discharge instructions or a physician of their choice this week to arrange follow up for further evaluation of possible pre-hypertension or Hypertension.  6:32 AM  Discussed results, diagnosis and plan with patient and/or family.  Questions addressed.  Disposition and follow-up agreed upon.  Specific discharge instructions explained.   The patient and/or family and I have discussed the diagnosis and risks, and we agree with discharging home to follow-up with their primary care, specialist or referral doctor. We also discussed returning to the Emergency Department immediately if new or worsening symptoms occur. We have discussed the symptoms which are most concerning that necessitate immediate return.            CONSULTS:  None    PROCEDURES:  None    FINAL IMPRESSION  1. Sciatica, unspecified laterality    2. Elevated blood pressure          DISPOSITION/PLAN   DISPOSITION     PATIENT REFERRED TO:  Benjaman Lobe    Schedule an appointment as soon as possible for a visit in 2 days      Layne Benton, MD  [place] [address]  [phone]    Schedule an appointment as soon as possible for a visit in 3 days        DISCHARGE MEDICATIONS:  New Prescriptions    No medications on file       (Please note that portions of this note were completed with a voice recognition program.  Efforts were made to edit the dictations but occasionally words are mis-transcribed.)    Oswaldo Conroy, MD  Attending Emergency Physician        Oswaldo Conroy, MD  07/21/14 [id number]

## 2014-08-11 NOTE — ED Notes (Signed)
Exam per MD.    Verneita Griffes, RN  08/11/14 2114

## 2014-08-11 NOTE — Discharge Instructions (Signed)
Neuropathic Pain: Care Instructions  Your Care Instructions  Neuropathic pain is caused by pressure on or damage to your nerves. It's often simply called nerve pain. Some people feel this type of pain all the time. For others, it comes and goes.  Diabetes, shingles, or an injury can cause nerve pain. Many people say the pain feels sharp, burning, or stabbing. But some people feel it as a dull ache. In some cases, it makes your skin very sensitive. So touch, pressure, and other sensations that did not hurt before may now cause pain.  It's important to know that this kind of pain is real and can affect your quality of life. It's also important to know that treatment can help. Treatment includes pain medicines, exercise, and physical therapy.  Medicines can help reduce the number of pain signals that travel over the nerves. This can make the painful areas less sensitive. It can also help you sleep better and improve your mood. But medicines are only one part of successful treatment.  Most people do best with more than one kind of treatment. Your doctor may recommend that you try cognitive-behavioral therapy and stress management. Or, if needed, you may decide to try to quit smoking, lower your blood pressure, or better control blood sugar. These kinds of healthy changes can also make a difference.  If you feel that your treatment is not working, talk to your doctor. And be sure to tell your doctor if you think you might be depressed or anxious. These are common problems that can also be treated.  Follow-up care is a key part of your treatment and safety. Be sure to make and go to all appointments, and call your doctor if you are having problems. It's also a good idea to know your test results and keep a list of the medicines you take.  How can you care for yourself at home?   Be safe with medicines. Read and follow all instructions on the label.   If the doctor gave you a prescription medicine for pain, take it as  prescribed.   If you are not taking a prescription pain medicine, ask your doctor if you can take an over-the-counter medicine.   Save hard tasks for days when you have less pain. Follow a hard task with an easy task. And remember to take breaks.   Relax, and reduce stress. You may want to try deep breathing or meditation. These can help.   Keep moving. Gentle, daily exercise can help reduce pain. Your doctor or physical therapist can tell you what type of exercise is best for you. This may include walking, swimming, and stationary biking. It may also include stretches and range-of-motion exercises.   Try heat, cold packs, and massage.   Get enough sleep. Constant pain can make you more tired. If the pain makes it hard to sleep, talk with your doctor.   Think positively. Your thoughts can affect your pain. Do fun things to distract yourself from the pain. See a movie, read a book, listen to music, or spend time with a friend.   Keep a pain diary. Try to write down how strong your pain is and what it feels like. Also try to notice and write down how your moods, thoughts, sleep, activities, and medicine affect your pain. These notes can help you and your doctor find the best ways to treat your pain.  Reducing constipation caused by pain medicine  Pain medicines often cause constipation. To reduce constipation:     Include fruits, vegetables, beans, and whole grains in your diet each day. These foods are high in fiber.   Drink plenty of fluids, enough so that your urine is light yellow or clear like water. If you have kidney, heart, or liver disease and have to limit fluids, talk with your doctor before you increase the amount of fluids you drink.   Get some exercise every day. Build up slowly to 30 to 60 minutes a day on 5 or more days of the week.   Take a fiber supplement, such as Citrucel or Metamucil, every day if needed. Read and follow all instructions on the label.   Schedule time each day for a  bowel movement. Having a daily routine may help. Take your time and do not strain when having a bowel movement.   Ask your doctor about a laxative. The goal is to have one easy bowel movement every 1 to 2 days. Do not let constipation go untreated for more than 3 days.  When should you call for help?  Call your doctor now or seek immediate medical care if:   You feel sad, anxious, or hopeless for more than a few days. This could mean you are depressed. Depression is common in people who have a lot of pain. But it can be treated.   You have trouble with bowel movements, such as:   No bowel movement in 3 days.   Blood in the anal area, in your stool, or on the toilet paper.   Diarrhea for more than 24 hours.  Watch closely for changes in your health, and be sure to contact your doctor if:   Your pain is getting worse.   You can't sleep because of pain.   You are very worried or anxious about your pain.   You have trouble taking your pain medicine.   You have any concerns about your pain medicine or its side effects.   You have vomiting or cramps for more than 2 hours.   Where can you learn more?   Go to [email or web address] and sign in to your MyChart account. Enter [phone] in the [place] box to learn more about "Neuropathic Pain: Care Instructions."    If you do not have an account, please click on the "Sign Up Now" link.      2006-2015 [employer]. Care instructions adapted under license by [facility]. This care instruction is for use with your licensed healthcare professional. If you have questions about a medical condition or this instruction, always ask your healthcare professional. [place] any warranty or liability for your use of this information.  Content Version: 10.6.465758; Current as of: December 21, 2013                Swimmer's Ear: Care Instructions  Your Care Instructions     Swimmer's ear (otitis externa) is  inflammation or infection of the ear canal. This is the passage that leads from the outer ear to the eardrum. Any water, sand, or other debris that gets into the ear canal and stays there can cause swimmer's ear. Putting cotton swabs or other items in the ear to clean it can also cause this problem.  Swimmer's ear can be very painful. But you can treat the pain and infection with medicines. You should feel better in a few days.  Follow-up care is a key part of your treatment and safety. Be sure to make and go to all appointments, and call  your doctor if you are having problems. It's also a good idea to know your test results and keep a list of the medicines you take.  How can you care for yourself at home?  Cleaning and care   Use antibiotic drops as your doctor directs.   Do not insert ear drops (other than the antibiotic ear drops) or anything else into the ear unless your doctor has told you to.   Avoid getting water in the ear until the problem clears up. Use cotton lightly coated with petroleum jelly as an earplug. Do not use plastic earplugs.   Use a hair dryer set on low to carefully dry the ear after you shower.   To ease ear pain, hold a warm washcloth against your ear.   Take pain medicines exactly as directed.   If the doctor gave you a prescription medicine for pain, take it as prescribed.   If you are not taking a prescription pain medicine, ask your doctor if you can take an over-the-counter medicine.  Inserting ear drops   Warm the drops to body temperature by rolling the container in your hands. Or you can place it in a cup of warm water for a few minutes.   Lie down, with your ear facing up.   Place drops inside the ear. Follow your doctor's instructions (or the directions on the label) for how many drops to use. Gently wiggle the outer ear or pull the ear up and back to help the drops get into the ear.   It's important to keep the liquid in the ear canal for 3 to 5 minutes.  When should  you call for help?  Call your doctor now or seek immediate medical care if:   You have a new or higher fever.   You have new or worse pain, swelling, warmth, or redness around or behind your ear.   You have new or increasing pus or blood draining from your ear.  Watch closely for changes in your health, and be sure to contact your doctor if:   You are not getting better after 2 days (48 hours).   Where can you learn more?   Go to [email or web address] and sign in to your MyChart account. Enter C706 in the [place] box to learn more about "Swimmer's Ear: Care Instructions."    If you do not have an account, please click on the "Sign Up Now" link.      2006-2015 [employer], [employer]. Care instructions adapted under license by [facility]. This care instruction is for use with your licensed healthcare professional. If you have questions about a medical condition or this instruction, always ask your healthcare professional. [place] any warranty or liability for your use of this information.  Content Version: 10.6.465758; Current as of: November 09, 2013

## 2014-08-11 NOTE — ED Notes (Signed)
Pt to ed with numerous c/o's of rt arm pain and burning in hand, and bil lower thigh, knee and feet  Burning pain, x 1 mo. States has MD appointment in 4 days.    Verneita Griffes, RN  08/11/14 2112

## 2014-08-11 NOTE — ED Provider Notes (Signed)
[facility]      CHIEF COMPLAINT       Chief Complaint   Patient presents with   ??? Other     numerous c/o's can't sleep,"rt arm pain, hand on fire", bil lat thighs knees and calfs, and feet pain.        Nurses Notes reviewed and I agree except as noted in the HPI.      HISTORY OF PRESENT ILLNESS    [name] [name] is a 33 y.o. female who presents exacerbation of her neuropathy.  She is having some burning to bilateral feet, occasionally her right hand.  Also intermittently to the lower extremities.  She is already on Neurontin but is unable to see sure if her exact dosage.  She also has a history of anxiety and some psychiatric disorders.  She has an appointment coming up with her primary care provider team within the week.  She's been having difficulty sleeping due to the discomfort of the neuropathy pain.  She is here today looking for something for sleep.  She has tried tramadol and Benadryl at home with only mild success.  She denies any new trauma or injury.    Second chief complaint is left ear pain.  She denies discharge or fevers, hearing loss, dizziness.    REVIEW OF SYSTEMS     10 point review of system reviewed and negative except where described as above.     PAST MEDICAL HISTORY     Past Medical History   Diagnosis Date   ??? Bipolar affective ([place])    ??? Paranoia ([place])    ??? OCD (obsessive compulsive disorder)    ??? Miscarriage    ??? Depression    ??? Anxiety    ??? Gestational diabetes mellitus    ??? Chronic pain    ??? Bipolar affective disorder ([place])    ??? Chronic back pain greater than 3 months duration    ??? Gestational diabetes mellitus in childbirth      with pregnancy   ??? Herniated disc      lumbar 3-4-5       SURGICAL HISTORY       Past Surgical History   Procedure Laterality Date   ??? Cervical disc surgery     ??? Cesarean section     ??? Ectopic pregnancy surgery     ??? Cervical spine surgery       C1-C2 fusion 2003       CURRENT MEDICATIONS       Previous Medications    CITALOPRAM (CELEXA) 20 MG  TABLET    Take 40 mg by mouth daily.    CYCLOBENZAPRINE (FLEXERIL) 10 MG TABLET    Take 1 tablet by mouth 2 times daily as needed for Muscle spasms.    GABAPENTIN (NEURONTIN PO)    Take by mouth    HYDROCODONE-ACETAMINOPHEN (NORCO) 5-325 MG PER TABLET    Take 1 tablet by mouth every 6 hours as needed for Pain    TRAZODONE (DESYREL) 150 MG TABLET    Take 150 mg by mouth nightly.       ALLERGIES     is allergic to latex; latex; cephalexin; and flagyl.    FAMILY HISTORY     indicated that her mother is alive. She indicated that her father is alive.  family history includes Arthritis in an other family member; Asthma in an other family member; Cancer in an other family member; Diabetes in an other family member; Kidney  Disease in an other family member.    SOCIAL HISTORY      reports that she has been smoking Cigarettes.  She has a 5.5 pack-year smoking history. She has never used smokeless tobacco. She reports that she drinks alcohol. She reports that she uses illicit drugs (Cocaine).    PHYSICAL EXAM     INITIAL VITALS:  height is 5\' 4"  (1.626 m) and weight is 63.05 kg (139 lb). Her oral temperature is 98.6 ??F (37 ??C). Her blood pressure is 128/87 and her pulse is 119. Her respiration is 16 and oxygen saturation is 100%.    Constitutional:  Well developed, well nourished, non-toxic appearance   Eyes:  PERRL, conjunctiva normal   HENT:  Atraumatic, left external auditory canal is erythematous with no signs of malignancy.  No debris or fluid present here.  No mastoid tenderness, nose normal, oropharynx moist, no pharyngeal exudates. Neck- normal range of motion, supple .  Muscle spasms to the bilateral cervical region  Respiratory:  No respiratory distress, normal breath sounds, no rales, no wheezing   Cardiovascular:  Normal rate, normal rhythm, no murmurs, no gallops, no rubs,   GI:  Soft, nondistended, normal bowel sounds, nontender, no organomegaly, no mass, no rebound, no guarding     Musculoskeletal:  No  tenderness, no deformities. No calf pain or tenderness.  Integument:  no rashes on exposed surfaces     Neurologic:  Alert & oriented x 3,  normal motor function, normal sensory function, no focal deficits noted .  Burning sensation to the lower extremities upon touching  Psychiatric:  Speech and behavior appropriate.  Mildly anxious, mildly agitated but pleasant    DIAGNOSTIC RESULTS       LABS:   Labs Reviewed - No data to display    EMERGENCY DEPARTMENT COURSE:   Vitals:    Filed Vitals:    08/11/14 2054   BP: 128/87   Pulse: 119   Temp: 98.6 ??F (37 ??C)   TempSrc: Oral   Resp: 16   Height: 5\' 4"  (1.626 m)   Weight: 63.05 kg (139 lb)   SpO2: 100%       MDM     I explained to the patient that she would best be served with her family doctor who knows her well, knows her medications well , can try treat her neuropathic pain with a titration in her medication.  I did offer her Ativan to help her sleep and only gave her a very small quantity of 5.    PROCEDURES:  None    FINAL IMPRESSION      1. Neuropathy    2. Infective otitis externa, left          DISPOSITION/PLAN     Home    PATIENT REFERRED TO:      In 1 day      Benjaman Lobe    In 1 day  Talk with your doctor about your neuropathy      DISCHARGE MEDICATIONS:  New Prescriptions    CIPROFLOXACIN-DEXAMETHASONE (CIPRODEX) 0.3-0.1 % OTIC SUSPENSION    Place 4 drops into the left ear 2 times daily for 7 days    LORAZEPAM (ATIVAN) 1 MG TABLET    Take 1 tablet by mouth nightly as needed (sleep)       (Please note that portions of this note were completed with a voice recognition program.  Efforts were made to edit the dictations but occasionally words are mis-transcribed.)  Orland Mustard, DO              Orland Mustard, DO  08/11/14 2132

## 2014-08-11 NOTE — ED Notes (Signed)
Pt dc/d with instructions, rx's and work note in stable condition, ambulatory to lobby. Home per ride.    Verneita Griffes, RN  08/11/14 [phone]

## 2014-08-12 ENCOUNTER — Inpatient Hospital Stay: Admit: 2014-08-12 | Discharge: 2014-08-12 | Attending: Emergency Medicine

## 2014-08-12 MED ORDER — KETOROLAC TROMETHAMINE 60 MG/2ML IJ SOLN
60 MG/2ML | Freq: Once | INTRAMUSCULAR | Status: AC
Start: 2014-08-12 — End: 2014-08-11
  Administered 2014-08-12: 02:00:00 60 mg via INTRAMUSCULAR

## 2014-08-12 MED ORDER — CIPROFLOXACIN-DEXAMETHASONE 0.3-0.1 % OT SUSP
Freq: Two times a day (BID) | OTIC | Status: AC
Start: 2014-08-12 — End: 2014-08-18

## 2014-08-12 MED ORDER — LORAZEPAM 1 MG PO TABS
1 MG | ORAL_TABLET | Freq: Every evening | ORAL | Status: DC | PRN
Start: 2014-08-12 — End: 2015-02-27

## 2014-08-12 MED FILL — KETOROLAC TROMETHAMINE 60 MG/2ML IJ SOLN: 60 MG/2ML | INTRAMUSCULAR | Qty: 2

## 2015-01-02 ENCOUNTER — Ambulatory Visit: Admit: 2015-01-02 | Payer: PRIVATE HEALTH INSURANCE | Primary: Family

## 2015-01-02 DIAGNOSIS — Z881 Allergy status to other antibiotic agents status: Secondary | ICD-10-CM

## 2015-01-02 NOTE — Unmapped (Signed)
Allergy/Immunology New Clinic Note     Cc: Flagyl allergy    HPI:  Anne Goodwin is a 34 y.o. female with PMHx of stable pituitary adenoma, Hashimotos thyroiditis, NIDDM, chronic pain syndrome, BPD here for evaluation of drug allergy.  +TV on vaginal path DNA probe [facility]) 07/09/14.  History immediate drug reaction to Flagyl. In 2011, given for TV and within of dose, she experienced tongue swelling and shortness of breath. Notably, she was subsequently given Keflex and she had a similar reaction days later.  No previous reaction to Flagyl.  No h/o asthma.  She was seen by [facility], but admission for desensitization could not be coordinated. She continues to have non-odorous clear/white discharge that has not changed in nature since October.    Other issues:  Sneezing/watery eyes in early and late spring and in fall. Never evaluated for allergies.  No frequent sinus infections.  Current smoker.    History of rash from contact with latex      Triggers:   Allergic                /  Non-Allergic   []  Tree           []  Cat     /    []  Perfumes/Fragrances  []  Eating    []  Grass         []  Dog    /     []  Smoke                          []  Temp. Change    []  Ragweed   []  Dust    /    []  Gas Fumes/Exhaust    []  Weather Change    [x]  Cut Grass  []  Mold    /     []  Position    [x]  Raking Leaves        /     []  Other:    []  Other:                                        Rhinitis Score: 10  []  n/a               Rhinitis Symptoms:     []  None    []  Nasal Congestion    [x]  Sneezing                []  Headache    []  Post-nasal drip        []  Cough                     []  Ear symptoms    []  Runny nose             x[]  Eye itch/watering    []  Other               Medication Adverse Reactions / Allergies: ([]  Reviewed and edited)    Allergies   Allergen Reactions   ??? Flagyl [Metronidazole]    ??? Keflex [Cephalexin]    ??? Latex, Natural Rubber                 Past Medical History:  Past Medical History    Diagnosis Date   ??? Back pain    ??? Diabetes mellitus    ??? OCD (  obsessive compulsive disorder)    ??? Anxiety    ??? Bipolar disorder    ??? Depression    ??? Abnormal Pap smear    ??? Pituitary adenoma    ??? Hashimoto's thyroiditis        Allergic/Infectious Personal History:    Previous Allergy Eval: []  No  [x]  Yes:  [facility]  Rhinitis: []  No  [x]  Yes:    Asthma: [x]  No  []  Yes:    Eczema: [x]  No  []  Yes:    Food Allergy: [x]  No  []  Yes: ([]  Resp  []  GI  []  Derm)    Insect Allergy Reactions: [x]  No  []  Yes:    Contact Reactions: []  No  x[]  Yes:  latex  Bacterial Infections/Year: [x]  No  []  Yes:    Viral Infections/Year: x[]  No  []  Yes:    Childhood Illnesses:  None significant        Past Surgical History:  Past Surgical History   Procedure Laterality Date   ??? Cesarean section     ??? Cervical fusion         Medications:  Prior to Admission medications taking for visit date 01/02/15   Medication Sig Taking? Authorizing Provider   citalopram (CELEXA) 10 MG tablet Take 10 mg by mouth daily. Yes Historical Provider, MD   cyclobenzaprine (FLEXERIL) 10 MG tablet Take 10 mg by mouth 3 times a day as needed for Muscle spasms. Yes Historical Provider, MD   docusate sodium (COLACE) 100 MG capsule Take 100 mg by mouth 2 times a day. Yes Historical Provider, MD   gabapentin (NEURONTIN) 300 MG capsule  Yes Historical Provider, MD   HYDROcodone-acetaminophen (NORCO) 5-325 mg per tablet  Yes Historical Provider, MD   meloxicam (MOBIC) 15 MG tablet  Yes Historical Provider, MD   metFORMIN (GLUCOPHAGE) 1000 MG tablet  Yes Historical Provider, MD   oxyCODONE-acetaminophen (PERCOCET) 5-325 mg per tablet  Yes Historical Provider, MD   traZODone (DESYREL) 50 MG Take 25 mg by mouth at bedtime. Yes Historical Provider, MD   acetaminophen (TYLENOL) 650 MG suppository Place 650 mg rectally every 4 hours as needed.  Historical Provider, MD   folic acid (FOLVITE) 1 MG tablet Take 1 mg by mouth daily.  Historical Provider, MD   glyBURIDE (DIABETA) 5 MG  tablet Take 5 mg by mouth daily with breakfast.  Historical Provider, MD   hydrOXYzine pamoate (VISTARIL) 50 MG capsule Take 1 capsule (50 mg total) by mouth every 3 hours as needed for Itching.  Marda Stalker, MD   ondansetron (ZOFRAN-ODT) 4 MG disintegrating tablet Take 4 mg by mouth every 8 hours as needed for Nausea.  Historical Provider, MD   PNV with calcium no.72-iron-FA (PRENATAL PLUS WITH IRON, CA,) 27 mg iron- 1 mg Tab tablet Take 1 tablet by mouth daily.  Historical Provider, MD                 Family History:   Allergic    none    Non-Allergic    ??? Cancer Maternal Grandfather   ??? Cancer Paternal Grandfather   ??? Diabetes Neg Hx   ??? Heart Disease Neg Hx   ??? High BP Neg Hx   ??? Thyroid Disease Neg Hx   ??? Blood Disorder Neg Hx   ??? Stroke Neg Hx   ??? High Cholesterol Paternal Grandmother   ??? Depression Mother   ??? Depression Sister   ??? Depression Brother       Social  History:    Marital Status:  divorced     Pets:      x[]  No  []  Yes:      Dampness/Water Damage  [x]  No  []  Yes:       ??? Smoking status: Current Every Day Smoker -- 0.50 packs/day   Types: Cigarettes   Last Attempt to Quit: 11/18/2012   ??? Smokeless tobacco: Never Used   Comment: occasionally   ??? Alcohol Use: No   ??? Drug Use: No   ??? Sexual Activity:   Partners: Male, not currently active      Review of Systems:    Const.:      x[]  NL  []  Weight change  []  Fever  []  Other:    Skin:        [x]  NL  []  Itching  []  Rash  []  Other:    Head/Eyes:   [x]  NL  []  Headache  []  Change in vision  []  Other:    ENT/Mouth:   [x]  NL  []  Change in hearing  []  Dental problems  []  Other:    Resp.:       [x]  NL  []  Cough  []  Difficulty breathing  []  Other:    CV:          [x]  NL  []  Chest pain  []  Leg swelling  []  Other:    GI:          [x]  NL  []  Nausea/vomiting  []  Diarrhea  []  Other:    GU:          [x]  NL  []  Frequent urination  []  Urinary infection  []  Other:    MS:          [x]  NL  []  Muscle weakness  []  Joint pain  []  Other:    Heme/Lymph: [] x NL  []  Easy bruising   []  Blood transfusion  []  Other:    Endo:        [x]  NL  []  Heat/cold intol.  []  Change in appetite  []  Other:    Neuro:       [x]  NL  []  Fainting  []  Seizures  []  Other:_    Psych:       []  NL  [x]  Anxiety  [x]  Depression  []  Other:_    All/Rheum:   []  NL  [x]  See ACT/Rhinitis []  Joint stiffness  []  Other:               Physical Examination:  Filed Vitals:    01/02/15 0852   BP: 123/71   Pulse: 98   Temp: 97.4 ??F (36.3 ??C)       Body mass index is 22.3 kg/(m^2).                System/Body Area  /  Normal/Negative     /  Positive Findings    Gen.       Distress     [x]  None significant     []  Present                   Habitus      [x]  Well Nourished       []  Overweight  []  Underweight    Skin        Turgor       [x]  Normal               []  Abnormal:  Rash         [x]  None                 []  Present:    Eyes       Sclera       [x]  Non-injected         []  Injected                                [x]  No watering          []  Watering                    Pupils       [x]  Equal                []  Not equal                                [x]  Reactive             []  Not reactive    ENT/OP  Mucosa       [x]  Non-edematous       []  Boggy  []  Edematous                                                           []  Erythematous                    Discharge   [x]  None                 []  Watery  []  Mucoid                    Septum       []  No perforation       []  Perforation                    Polyps       []  None                 []  Present                    Oropharynx []  No edema, thrush    []  Cobblestoning  []  Thrush    Neck        ROM          [x]  Neck supple          []  Neck not supple                    Thyroid      []  No enlargement       []  Thyroid enlargement    Resp.      Effort       [x]  No resp distress     []  Resp distress                    Ausc.        [x]  Lungs Clear          []  Wheezing  []  Crackles    CV  Rhythm       [x]  Regular rhythm       []  Irregular rhythm                    Sounds        [x]  No M/R/G             []  Murmurs, rubs, gallops                    Edema        []  No edema             []  Edema    GI          Bowel Sound[x]  Normal               []  Hypoactive  []  Hyperactive                     Abd Palp    [x]  No tenderness        []  Tenderness    GU           CVA          []  No tenderness        []  Tenderness                     Bladder      []  Not distended        []  Distended    MS           Joints       [x]  No tenderness        []  Tenderness                     Muscles    []  No tenderness        []  Tenderness    Lymph      Neck         [x]  No lad    []  Lymphadenopathy                     Axillae      []  No lad    []  Lymphadenopathy    Neuro      MS  [x]  A/O x3              []  Not oriented x3                     Sensation  [x]  Grossly normal       []  Sensation not normal    Other:               Test/Procedure Results:    Labs    [x]  reviewed:                 Assessment and Plan:   This is a 34 y.o. female evaluated for the following    Drug allergy.  History consistent with hyperacute reaction to Metronidazole.  There is no effective alternative to treatment with metronidazole for her documented TV diagnosis. Plan to admit for desensitization next week.  No indication for skin testing.    Chronic rhinitis. Likely allergic.  Can perform skin testing in the future.  No change in current management    Latex allergy: continue to avoid    Cephalosporin allergy: difficult to know if this is true allergy or delayed reaction to Flagyl.  Offered  pcn/Keflex skin testing to better define risk, which can be done at f/u OP appointment.    Teaching Statement: I have seen and discussed the patient with my supervising     practitioner, Dr. Harley Hallmark, who agrees with my assessment and plan.               Follow Up:  As needed    Abe People, MD  11:41 AM, 01/02/2015

## 2015-01-02 NOTE — Unmapped (Signed)
I saw and examined the patient.  I discussed with the resident or fellow and agree with Anne People, MD's findings and plan as documented in the note.    HPI: Pt is a 34 y/o female with trichomoniasis infection and h/o flagyl allergy in need of metronidazole treatment.  Reports tongue swelling and shortness of breath within 20 minutes of taking flagyl in 2009.    Physical Exam:  Skin clear  Lungs clear    A&P:  1. H/o metronidazole allergy with trichomoniasis infection and need for metronidazole treatment:  - will admit pt to [facility] for metronidazole desensitization; need to confirm dose and documentation of need for metronidazole from referring provider prior to procedure  - unfortunately there is no validated method to assess hypersensitivity rxn to metronidazole so skin testing is not indicated in this case and she will need to undergo desensitization every time she needs metronidazole    Anne Goodwin Alinda Dooms, MD

## 2015-01-02 NOTE — Unmapped (Signed)
We will call with information about admission next week for desensitization

## 2015-01-03 NOTE — Unmapped (Signed)
Addended by: Hayden Pedro A on: 01/03/2015 10:15 PM     Modules accepted: Level of Service

## 2015-01-14 NOTE — Unmapped (Signed)
Pt calling back, she said she actually has the trichomonas and has not been treated for it  She became angry on the phone wanting to know why is this happening because she gave all of the information , thought she was gonna be put in the [place], Wants the doctor to call her right now   Has a phone where its hard for her to get her messages and that she has to go to work  She left a call back number of [phone] which she says is her work number and that you will have to ask for her

## 2015-01-14 NOTE — Unmapped (Signed)
Pt needs to make an appt with OB/GYN to be retested for trichomonas before disinsectization is set up      She needs to call clinic with date and time of appt with OB/GYN

## 2015-01-15 NOTE — Unmapped (Addendum)
Unable to make contact with patient this morning.  Decision made to ensure infection is ongoing before admitting patient for high-risk procedure.  Repeat testing can be done by OB, PCP or urgent care.  Will discuss directly with patient once she can be reached.  Please page me at the number below if the patient calls into clinic.    Venia Carbon, MD  Allergy/Immunology Fellow  [phone]

## 2015-01-16 NOTE — Unmapped (Signed)
I attempted to contact Anne Goodwin yesterday; however, I was unable to reach her--I left a VM for her to call the clinic with the best time to reach and at which number.

## 2015-01-16 NOTE — Unmapped (Signed)
Will do!    Please send call to one of the nurses in the clinic if pt calls the triage line (perferablly Angie [phone])     Notify nurse before transfer please     Thanks

## 2015-02-27 ENCOUNTER — Inpatient Hospital Stay: Admit: 2015-02-27 | Discharge: 2015-02-27 | Attending: Emergency Medicine

## 2015-02-27 ENCOUNTER — Encounter: Admit: 2015-02-27 | Primary: Family Medicine

## 2015-02-27 ENCOUNTER — Encounter: Primary: Family Medicine

## 2015-02-27 DIAGNOSIS — M5136 Other intervertebral disc degeneration, lumbar region: Secondary | ICD-10-CM

## 2015-02-27 MED ORDER — NAPROXEN 250 MG PO TABS
250 MG | Freq: Once | ORAL | Status: AC
Start: 2015-02-27 — End: 2015-02-27
  Administered 2015-02-27: 16:00:00 500 mg via ORAL

## 2015-02-27 MED ORDER — NAPROXEN 500 MG PO TABS
500 MG | ORAL_TABLET | Freq: Two times a day (BID) | ORAL | Status: AC
Start: 2015-02-27 — End: 2015-03-05

## 2015-02-27 MED ORDER — CYCLOBENZAPRINE HCL 10 MG PO TABS
10 MG | ORAL_TABLET | Freq: Three times a day (TID) | ORAL | Status: DC | PRN
Start: 2015-02-27 — End: 2016-03-10

## 2015-02-27 MED ORDER — CYCLOBENZAPRINE HCL 10 MG PO TABS
10 MG | Freq: Once | ORAL | Status: AC
Start: 2015-02-27 — End: 2015-02-27
  Administered 2015-02-27: 16:00:00 10 mg via ORAL

## 2015-02-27 MED FILL — CYCLOBENZAPRINE HCL 10 MG PO TABS: 10 MG | ORAL | Qty: 1

## 2015-02-27 MED FILL — NAPROXEN 250 MG PO TABS: 250 MG | ORAL | Qty: 2

## 2015-02-27 NOTE — ED Notes (Signed)
Patient given prescription, discharge instructions verbal and written, verbalized understanding.  Alert/oriented X4, Clear speech.  Patient exhibits no distress, ambulates with steady gait per self leaving unit, no further request. Wheel chair assist to lobby.    Dorothe Pea, RN  02/27/15 [phone]
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
## 2015-05-22 ENCOUNTER — Inpatient Hospital Stay: Admit: 2015-05-22 | Discharge: 2015-05-22 | Attending: Emergency Medicine

## 2015-05-22 DIAGNOSIS — R5383 Other fatigue: Secondary | ICD-10-CM

## 2015-05-22 LAB — CBC WITH AUTO DIFFERENTIAL
Basophils %: 0.8 %
Basophils Absolute: 0.1 10*3/uL (ref 0.0–0.2)
Eosinophils %: 0.4 %
Eosinophils Absolute: 0 10*3/uL (ref 0.0–0.6)
Hematocrit: 42.8 % (ref 36.0–48.0)
Hemoglobin: 13.9 g/dL (ref 12.0–16.0)
Lymphocytes %: 26.9 %
Lymphocytes Absolute: 2.1 10*3/uL (ref 1.0–5.1)
MCH: 29.5 pg (ref 26.0–34.0)
MCHC: 32.4 g/dL (ref 31.0–36.0)
MCV: 90.9 fL (ref 80.0–100.0)
MPV: 9.2 fL (ref 5.0–10.5)
Monocytes %: 4.9 %
Monocytes Absolute: 0.4 10*3/uL (ref 0.0–1.3)
Neutrophils %: 67 %
Neutrophils Absolute: 5.2 10*3/uL (ref 1.7–7.7)
Platelets: 221 10*3/uL (ref 135–450)
RBC: 4.71 M/uL (ref 4.00–5.20)
RDW: 13.2 % (ref 12.4–15.4)
WBC: 7.8 10*3/uL (ref 4.0–11.0)

## 2015-05-22 LAB — TSH WITH REFLEX: TSH: 0.37 u[IU]/mL (ref 0.27–4.20)

## 2015-05-22 LAB — MICROSCOPIC URINALYSIS

## 2015-05-22 LAB — COMPREHENSIVE METABOLIC PANEL
ALT: 12 U/L (ref 10–40)
AST: 13 U/L — ABNORMAL LOW (ref 15–37)
Albumin/Globulin Ratio: 1.6 (ref 1.1–2.2)
Albumin: 4.3 g/dL (ref 3.4–5.0)
Alkaline Phosphatase: 69 U/L (ref 40–129)
Anion Gap: 14 (ref 3–16)
BUN: 16 mg/dL (ref 7–20)
CO2: 26 mmol/L (ref 21–32)
Calcium: 9.4 mg/dL (ref 8.3–10.6)
Chloride: 104 mmol/L (ref 99–110)
Creatinine: 0.6 mg/dL (ref 0.6–1.1)
GFR African American: >60 (ref >60)
GFR Non-African American: >60 (ref >60)
Globulin: 2.7 g/dL
Glucose: 122 mg/dL — ABNORMAL HIGH (ref 70–99)
Potassium: 3.8 mmol/L (ref 3.5–5.1)
Sodium: 144 mmol/L (ref 136–145)
Total Bilirubin: 0.3 mg/dL (ref 0.0–1.0)
Total Protein: 7 g/dL (ref 6.4–8.2)

## 2015-05-22 LAB — URINALYSIS
Bilirubin Urine: NEGATIVE
Blood, Urine: NEGATIVE
Glucose, Ur: NEGATIVE mg/dL
Nitrite, Urine: NEGATIVE
Protein, UA: NEGATIVE mg/dL
Specific Gravity, UA: 1.015 (ref 1.005–1.030)
Urobilinogen, Urine: 0.2 E.U./dL (ref <2.0)
pH, UA: 7 (ref 5.0–8.0)

## 2015-05-22 LAB — T4, FREE: T4 Free: 1.1 ng/dL (ref 0.9–1.8)

## 2015-05-22 LAB — PREGNANCY, URINE: HCG(Urine) Pregnancy Test: NEGATIVE
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
## 2015-05-22 NOTE — ED Notes (Signed)
Labs obtained straight stick RAC.     Katheren Shams, RN  05/22/15 1434

## 2015-05-22 NOTE — ED Provider Notes (Signed)
CHIEF COMPLAINT  Fatigue      HISTORY OF PRESENT ILLNESS  [name] [name] is a 34 y.o. female presents to the ED with gernal maliase.  No other complaints, modifying factors or associated symptoms. Told by doc that her throid levels may have been low in past. Denies abd pain, no hair loss, no weight loss. gernal fatigue and no other systemic complaints.   I have reviewed the following from the nursing documentation.    Past Medical History   Diagnosis Date   ??? Anxiety    ??? Bipolar affective ([place])    ??? Bipolar affective disorder ([place])    ??? Cervical fusion syndrome    ??? Chronic back pain greater than 3 months duration    ??? Chronic pain    ??? Depression    ??? Gestational diabetes mellitus    ??? Gestational diabetes mellitus in childbirth      with pregnancy   ??? 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 Miscarriage    ??? OCD (obsessive compulsive disorder)    ??? Paranoia [facility])      Past Surgical History   Procedure Laterality Date   ??? Cervical disc surgery     ??? Cesarean section     ??? Ectopic pregnancy surgery     ??? Cervical spine surgery       C1-C2 fusion 2003     Family History   Problem Relation Age of Onset   ??? Arthritis Other    ??? Asthma Other    ??? Cancer Other    ??? Diabetes Other    ??? Kidney Disease Other      Social History     Social History   ??? Marital status: Divorced     Spouse name: N/A   ??? Number of children: 1   ??? Years of education: N/A     Occupational History   ??? Not on file.     Social History Main Topics   ??? Smoking status: Current Every Day Smoker     Packs/day: 0.50     Years: 11.00     Types: Cigarettes   ??? Smokeless tobacco: Never Used   ??? Alcohol use Yes      Comment: OCC every 2-3 weeks   ??? Drug use: No      Comment: 2011 last use   ??? Sexual activity: Not on file     Other Topics Concern   ??? Not on file     Social History Narrative    ** Merged History Encounter **          No current facility-administered medications for this encounter.      Current Outpatient Prescriptions   Medication Sig  Dispense Refill   ??? Meloxicam (MOBIC PO) Take by mouth     ??? cyclobenzaprine (FLEXERIL) 10 MG tablet Take 1 tablet by mouth 3 times daily as needed for Muscle spasms 12 tablet 0   ??? HYDROcodone-acetaminophen (NORCO) 5-325 MG per tablet Take 1 tablet by mouth every 6 hours as needed for Pain     ??? Gabapentin (NEURONTIN PO) Take by mouth     ??? citalopram (CELEXA) 20 MG tablet Take 40 mg by mouth daily.     ??? traZODone (DESYREL) 150 MG tablet Take 150 mg by mouth nightly.       Allergies   Allergen Reactions   ??? Latex Itching, Rash and Other (See Comments)  Burning to the touch    ??? Latex Rash   ??? Cephalexin Shortness Of Breath   ??? Flagyl [Metronidazole] Shortness Of Breath       REVIEW OF SYSTEMS  10 systems reviewed, pertinent positives per HPI otherwise noted to be negative.    PHYSICAL EXAM  Visit Vitals   ??? BP 115/73   ??? Pulse 82   ??? Temp 97.7 ??F (36.5 ??C) (Oral)   ??? Resp 15   ??? Ht 5' 5.5" (1.664 m)   ??? Wt 62.6 kg (138 lb)   ??? LMP 05/12/2015   ??? SpO2 100%   ??? BMI 22.62 kg/m2     GENERAL APPEARANCE: Awake and alert. Cooperative. no distress, well nourished,.   HEAD: Normocephalic. Atraumatic.  EYES: PERRL. EOM's grossly intact.   ENT: Mucous membranes are moist.   NECK: Supple.   HEART: RRR. No murmurs  LUNGS: Respirations breathing easy. Lungs are clear.   ABDOMEN: Soft. Non-distended. Non-tender. No guarding or rebound. Normal bowel sounds.  EXTREMITIES: No peripheral edema. Moves all extremities equally. All extremities neurovascularly intact.   SKIN: Warm and dry. No acute rashes.   NEUROLOGICAL: Alert and oriented. No gross facial drooping. Strength 5/5, sensation intact. No truncal ataxia. 2+ DTR's present in the patellar bilaterally  PSYCHIATRIC: Normal mood and affect.    LABS  I have reviewed all labs for this visit.   Results for orders placed or performed during the hospital encounter of 05/22/15   CBC Auto Differential   Result Value Ref Range    WBC 7.8 4.0 - 11.0 K/uL    RBC 4.71 4.00 - 5.20 M/uL     Hemoglobin 13.9 12.0 - 16.0 g/dL    Hematocrit 42.8 36.0 - 48.0 %    MCV 90.9 80.0 - 100.0 fL    MCH 29.5 26.0 - 34.0 pg    MCHC 32.4 31.0 - 36.0 g/dL    RDW 13.2 12.4 - 15.4 %    Platelets 221 135 - 450 K/uL    MPV 9.2 5.0 - 10.5 fL    Neutrophils Relative 67.0 %    Lymphocytes Relative 26.9 %    Monocytes Relative 4.9 %    Eosinophils Relative Percent 0.4 %    Basophils Relative 0.8 %    Neutrophils Absolute 5.2 1.7 - 7.7 K/uL    Lymphocytes Absolute 2.1 1.0 - 5.1 K/uL    Monocytes Absolute 0.4 0.0 - 1.3 K/uL    Eosinophils Absolute 0.0 0.0 - 0.6 K/uL    Basophils Absolute 0.1 0.0 - 0.2 K/uL   Comprehensive metabolic panel   Result Value Ref Range    Sodium 144 136 - 145 mmol/L    Potassium 3.8 3.5 - 5.1 mmol/L    Chloride 104 99 - 110 mmol/L    CO2 26 21 - 32 mmol/L    Anion Gap 14 3 - 16    Glucose 122 (H) 70 - 99 mg/dL    BUN 16 7 - 20 mg/dL    CREATININE 0.6 0.6 - 1.1 mg/dL    GFR Non-African American >60 >60    GFR African American >60 >60    Calcium 9.4 8.3 - 10.6 mg/dL    Total Protein 7.0 6.4 - 8.2 g/dL    Alb 4.3 3.4 - 5.0 g/dL    Albumin/Globulin Ratio 1.6 1.1 - 2.2    Total Bilirubin 0.3 0.0 - 1.0 mg/dL    Alkaline Phosphatase 69 40 - 129 U/L  ALT 12 10 - 40 U/L    AST 13 (L) 15 - 37 U/L    Globulin 2.7 g/dL   Urinalysis, reflex to microscopic   Result Value Ref Range    Color, UA Yellow Straw/Yellow    Clarity, UA SL CLOUDY (A) Clear    Glucose, Ur Negative Negative mg/dL    Bilirubin Urine Negative Negative    Ketones, Urine TRACE (A) Negative mg/dL    Specific Gravity, UA 1.015 1.005 - 1.030    Blood, Urine Negative Negative    pH, UA 7.0 5.0 - 8.0    Protein, UA Negative Negative mg/dL    Urobilinogen, Urine 0.2 <2.0 E.U./dL    Nitrite, Urine Negative Negative    Leukocyte Esterase, Urine TRACE (A) Negative    Microscopic Examination YES     Urine Type Not Specified    Pregnancy, Urine   Result Value Ref Range    HCG(Urine) Pregnancy Test Negative Detects HCG level >20 MIU/mL   Microscopic  Urinalysis   Result Value Ref Range    Mucus, UA 1+ (A) /LPF    WBC, UA 0-2 0 - 5 /HPF    RBC, UA 0-2 0 - 2 /HPF    Epi Cells 5-10 /HPF    Bacteria, UA Rare (A) /HPF    Amorphous, UA 3+ (A) /HPF       EKG  @EMPEKG @    RADIOLOGY  No results found.      PROCEDURES    ED COURSE/MDM  Patient seen and evaluated. Old records reviewed. Labs and imaging reviewed and results discussed with patient. I considered hypothroid, uti, viral syndrome.    Patient was given the following medications in the ED: none  Plan of care discussed with patient and family. Patient and family in agreement with plan. t4 level pending.     Patient was given scripts for the following medications. I counseled patient how to take these medications.   New Prescriptions    No medications on file       CLINICAL IMPRESSION  1. Malaise and fatigue        Blood pressure 115/73, pulse 82, temperature 97.7 ??F (36.5 ??C), temperature source Oral, resp. rate 15, height 5' 5.5" (1.664 m), weight 62.6 kg (138 lb), last menstrual period 05/12/2015, SpO2 100 %.    DISPOSITION  [name] [name] was discharged to home in stable condition and follow up with pcp for throid level           Kathlene Cote, MD  05/22/15 [phone]

## 2015-05-22 NOTE — ED Notes (Signed)
Pt verbalized understanding of discharge instructions, follow-up care. Skin warm, dry. Respirations unlabored. GCS 15. No s/s of distress noted prior to ambulating out of dept with steady gait.      Katheren Shams, RN  05/22/15 1525

## 2015-07-08 ENCOUNTER — Encounter: Admit: 2015-07-08 | Payer: PRIVATE HEALTH INSURANCE | Primary: Family Medicine

## 2015-07-08 ENCOUNTER — Inpatient Hospital Stay
Admit: 2015-07-08 | Discharge: 2015-07-09 | Disposition: A | Discharge status: Home / Self Care | Payer: PRIVATE HEALTH INSURANCE | Attending: Emergency Medicine

## 2015-07-08 DIAGNOSIS — T401X1A Poisoning by heroin, accidental (unintentional), initial encounter: Secondary | ICD-10-CM

## 2015-07-08 MED ORDER — ACETAMINOPHEN 500 MG PO TABS
500 MG | Freq: Once | ORAL | Status: AC
Start: 2015-07-08 — End: 2015-07-08
  Administered 2015-07-08: 1000 mg via ORAL

## 2015-07-08 MED FILL — TYLENOL EXTRA STRENGTH 500 MG PO TABS: 500 MG | ORAL | Qty: 2
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
## 2015-07-08 NOTE — ED Notes (Signed)
Nurse cleansed the two lacerations on the patient's nose with Hibiclen's and water. Patient tolerated well. Will continue to monitor.     Delman Cheadle, RN  07/08/15 2306
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
## 2015-07-08 NOTE — Progress Notes (Signed)
EKG done and given to Dr. Mina Marble.

## 2015-07-09 LAB — URINE DRUG SCREEN
Amphetamine Screen, Urine: NEGATIVE
Barbiturate Screen, Ur: NEGATIVE (ref <200)
Benzodiazepine Screen, Urine: NEGATIVE (ref <200)
Cannabinoid Scrn, Ur: NEGATIVE (ref <50)
Cocaine Metabolite Screen, Urine: POSITIVE — AB (ref <300)
Methadone Screen, Urine: NEGATIVE (ref <300)
Opiate Scrn, Ur: POSITIVE — AB (ref <300)
Oxycodone Urine: NEGATIVE (ref <100)
PCP Screen, Urine: NEGATIVE (ref <25)
Propoxyphene Scrn, Ur: NEGATIVE (ref <300)
pH, UA: 5

## 2015-07-09 LAB — PREGNANCY, URINE: HCG(Urine) Pregnancy Test: NEGATIVE

## 2015-07-09 MED ORDER — SODIUM CHLORIDE 0.9 % IV BOLUS
0.9 % | Freq: Once | INTRAVENOUS | Status: AC
Start: 2015-07-09 — End: 2015-07-08
  Administered 2015-07-09: 1000 mL via INTRAVENOUS

## 2015-07-09 MED ORDER — LIDOCAINE-EPINEPHRINE-TETRACAINE TOP SOLN
Freq: Once | Status: AC
Start: 2015-07-09 — End: 2015-07-08
  Administered 2015-07-09: 02:00:00 3 via TOPICAL

## 2015-07-09 MED FILL — LIDOCAINE-EPINEPHRINE-TETRACAINE TOP SOLN: Qty: 1

## 2015-07-10 LAB — EKG 12-LEAD
Atrial Rate: 103 {beats}/min
P Axis: 82 degrees
P-R Interval: 140 ms
Q-T Interval: 352 ms
QRS Duration: 86 ms
QTc Calculation (Bazett): 461 ms
R Axis: 95 degrees
T Axis: 53 degrees
Ventricular Rate: 103 {beats}/min

## 2015-08-14 ENCOUNTER — Inpatient Hospital Stay
Admit: 2015-08-14 | Discharge: 2015-08-14 | Disposition: A | Discharge status: Home / Self Care | Attending: Emergency Medicine

## 2015-08-14 DIAGNOSIS — T401X1A Poisoning by heroin, accidental (unintentional), initial encounter: Secondary | ICD-10-CM

## 2015-08-14 MED ORDER — ONDANSETRON 4 MG PO TBDP
4 MG | ORAL | Status: AC
Start: 2015-08-14 — End: 2015-08-14
  Administered 2015-08-14: 11:00:00 8 via ORAL

## 2015-08-14 MED ORDER — ONDANSETRON 4 MG PO TBDP
4 MG | Freq: Once | ORAL | Status: AC
Start: 2015-08-14 — End: 2015-08-14

## 2015-08-14 MED ORDER — ONDANSETRON HCL 8 MG PO TABS
8 MG | ORAL_TABLET | Freq: Three times a day (TID) | ORAL | 0 refills | Status: DC | PRN
Start: 2015-08-14 — End: 2016-03-10

## 2015-08-14 MED FILL — ONDANSETRON 4 MG PO TBDP: 4 MG | ORAL | Qty: 2

## 2015-08-14 NOTE — ED Provider Notes (Signed)
Emergency Department provider note:    CHIEF COMPLAINT  Drug Overdose (pt states she used snorted Heroin, pt was given 2mg  Narcan nasal and 2mg  Narcan IV.)      HISTORY OF PRESENT ILLNESS  [name] [name] is a 34 y.o. female who presents to the ED [facility] who were called to patient's apartment secondary to heroin overdose.  Patient recalls that she was snoring heroin earlier in the evening and she's been using heroin for at least 3 months.  Paramedics report that patient was unresponsive with snoring respirations/agonal breathing and was unresponsive to noxious stimuli.  Patient received Narcan 2 mg intranasally followed by Narcan 2 mg IV which had appropriate response and save the patient's life. Patient admits to IV and inhalation heroin abuse in the past.  Patient smokes cigarettes occasionally.  She reports history of diabetes does not recall what medications that she is on for her diabetes.  Patient denied intentional heroin overdose and denied thoughts of hurting herself.  Patient reports that she feels nauseated upon arrival.            Past Medical History   Diagnosis Date   ??? Anxiety    ??? Bipolar affective ([place])    ??? Bipolar affective disorder ([place])    ??? Cervical fusion syndrome    ??? Chronic back pain greater than 3 months duration    ??? Chronic pain    ??? Depression    ??? Gestational diabetes mellitus    ??? Gestational diabetes mellitus in childbirth      with pregnancy   ??? 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 Miscarriage    ??? OCD (obsessive compulsive disorder)    ??? Paranoia [facility])      Past Surgical History   Procedure Laterality Date   ??? Cervical disc surgery     ??? Cesarean section     ??? Ectopic pregnancy surgery     ??? Cervical spine surgery       C1-C2 fusion 2003     Family History   Problem Relation Age of Onset   ??? Arthritis Other    ??? Asthma Other    ??? Cancer Other    ??? Diabetes Other    ??? Kidney Disease Other      Social History     Social History   ??? Marital status: Divorced     Spouse name:  N/A   ??? Number of children: 1   ??? Years of education: N/A     Occupational History   ??? Not on file.     Social History Main Topics   ??? Smoking status: Current Every Day Smoker     Packs/day: 0.50     Years: 11.00     Types: Cigarettes   ??? Smokeless tobacco: Never Used   ??? Alcohol use Yes      Comment: OCC every 2-3 weeks   ??? Drug use: Yes     Special: Cocaine, Opiates       Comment: pt states she snorted heroin today.   ??? Sexual activity: Not on file     Other Topics Concern   ??? Not on file     Social History Narrative    ** Merged History Encounter **          No current facility-administered medications for this encounter.      Current Outpatient Prescriptions   Medication Sig Dispense Refill   ??? ondansetron (  ZOFRAN) 8 MG tablet Take 1 tablet by mouth every 8 hours as needed for Nausea 10 tablet 0   ??? Meloxicam (MOBIC PO) Take by mouth     ??? cyclobenzaprine (FLEXERIL) 10 MG tablet Take 1 tablet by mouth 3 times daily as needed for Muscle spasms 12 tablet 0   ??? HYDROcodone-acetaminophen (NORCO) 5-325 MG per tablet Take 1 tablet by mouth every 6 hours as needed for Pain     ??? Gabapentin (NEURONTIN PO) Take by mouth     ??? citalopram (CELEXA) 20 MG tablet Take 40 mg by mouth daily.     ??? traZODone (DESYREL) 150 MG tablet Take 150 mg by mouth nightly.       Allergies   Allergen Reactions   ??? Latex Itching, Rash and Other (See Comments)     Burning to the touch    ??? Latex Rash   ??? Cephalexin Shortness Of Breath   ??? Flagyl [Metronidazole] Shortness Of Breath       REVIEW OF SYSTEMS  10 systems reviewed, pertinent positives per HPI otherwise noted to be negative    PHYSICAL EXAM  ED Triage Vitals   BP Temp Temp src Pulse Resp SpO2 Height Weight   -- -- -- -- -- -- -- --                 GENERAL APPEARANCE: Awake and alert. Cooperative. No acute distress.  HEAD: Normocephalic. Atraumatic.  EYES:  EOM's grossly intact. PERRLA pupils approximately 3 mm  ENT: Mucous membranes are moist.   NECK: Supple. Normal ROM.   HEART: RRR    LUNGS: Breathing is unlabored. Speaking comfortably in full sentences. Lungs clear to auscultation bilaterally  ABDOMEN: Nondistended, nontender  EXTREMITIES: MAEE. No acute deformities.   SKIN: Warm and dry.    NEUROLOGICAL: Alert and oriented. Strength is 5/5 in all extremities and sensation is intact.    LABS  No results found for this visit on 08/14/15.    RADIOLOGY  No orders to display         ED COURSE/MDM  Patient seen and evaluated. Patient was monitored in the emergency department until such time as patient can have her boyfriend come to pick her up for other family member or friend.  Patient provided Zofran 8 mg ODT for nausea and vomiting likely secondary to combination of Narcan as well as earlier use of heroin.  Patient is tolerating p.o. Fluids.. Patient is appropriate for discharge home.  Patient discharged home improved, stable, and ambulatory condition.  She was prescribed Zofran for symptomatic treatment of nausea and vomiting.  Patient apparently had a mixed toxidrome of cocaine and opiates likely the cause of her symptoms.  Patient has been monitored and will be discharged home when she has a ride.  Patient concurs the disposition plan.  Discharged home in improved, stable, and in moderate condition.    Patient was given scripts for the following medications. I counseled patient how to take these medications.   Discharge Medication List as of 08/14/2015  8:27 AM      START taking these medications    Details   ondansetron (ZOFRAN) 8 MG tablet Take 1 tablet by mouth every 8 hours as needed for Nausea, Disp-10 tablet, R-0                 CLINICAL IMPRESSION  1. Heroin overdose, accidental or unintentional, initial encounter    2. History of heroin abuse    3. Nondependent  cocaine abuse            DISPOSITION  DISPOSITION Decision to Discharge    PATIENT REFERRED TO:  [place] C-[facility] Good    Schedule an appointment as soon as possible for a visit  Call to schedule follow-up appointment with  your primary care provider within 2-4 days.    [facility]  [address]  [place] [address]  [phone]    As needed, If symptoms worsen      Comment: Please note this report has been produced using speech recognition software and may contain errors related to that system including errors in grammar, punctuation, and spelling, as well as words and phrases that may be inappropriate. If there are any questions or concerns please feel free to contact the dictating provider for clarification.       Laurey Morale, DO  08/27/15 [phone]
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
## 2015-08-14 NOTE — ED Triage Notes (Signed)
pt states she used snorted Heroin, pt was given 2mg  Narcan nasal and 2mg  Narcan IV.

## 2015-08-14 NOTE — ED Notes (Signed)
Patient is sleeping in bed.  Patients O2 sats 89%.  HR 114.  MD aware.  Patient aroused and sats up to 100%     Andreas Ohm, RN  08/14/15 [phone]

## 2015-09-22 ENCOUNTER — Encounter (HOSPITAL_COMMUNITY): Payer: Self-pay | Admitting: Emergency Medicine

## 2015-09-22 DIAGNOSIS — Y929 Unspecified place or not applicable: Secondary | ICD-10-CM | POA: Insufficient documentation

## 2015-09-22 DIAGNOSIS — Y999 Unspecified external cause status: Secondary | ICD-10-CM | POA: Diagnosis not present

## 2015-09-22 DIAGNOSIS — Z79899 Other long term (current) drug therapy: Secondary | ICD-10-CM | POA: Insufficient documentation

## 2015-09-22 DIAGNOSIS — F17200 Nicotine dependence, unspecified, uncomplicated: Secondary | ICD-10-CM | POA: Diagnosis not present

## 2015-09-22 DIAGNOSIS — E119 Type 2 diabetes mellitus without complications: Secondary | ICD-10-CM | POA: Insufficient documentation

## 2015-09-22 DIAGNOSIS — S060X0A Concussion without loss of consciousness, initial encounter: Secondary | ICD-10-CM | POA: Diagnosis not present

## 2015-09-22 DIAGNOSIS — S0990XA Unspecified injury of head, initial encounter: Secondary | ICD-10-CM | POA: Diagnosis not present

## 2015-09-22 DIAGNOSIS — S199XXA Unspecified injury of neck, initial encounter: Secondary | ICD-10-CM | POA: Diagnosis present

## 2015-09-22 DIAGNOSIS — Z91040 Latex allergy status: Secondary | ICD-10-CM | POA: Insufficient documentation

## 2015-09-22 DIAGNOSIS — S161XXA Strain of muscle, fascia and tendon at neck level, initial encounter: Secondary | ICD-10-CM | POA: Insufficient documentation

## 2015-09-22 DIAGNOSIS — Y9389 Activity, other specified: Secondary | ICD-10-CM | POA: Insufficient documentation

## 2015-09-22 DIAGNOSIS — S3992XA Unspecified injury of lower back, initial encounter: Secondary | ICD-10-CM | POA: Insufficient documentation

## 2015-09-22 DIAGNOSIS — M1990 Unspecified osteoarthritis, unspecified site: Secondary | ICD-10-CM | POA: Diagnosis not present
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
## 2015-09-23 ENCOUNTER — Emergency Department (HOSPITAL_COMMUNITY): Payer: PRIVATE HEALTH INSURANCE

## 2015-09-23 ENCOUNTER — Emergency Department (HOSPITAL_COMMUNITY)
Admission: EM | Admit: 2015-09-23 | Discharge: 2015-09-23 | Disposition: A | Discharge status: Home / Self Care | Payer: PRIVATE HEALTH INSURANCE | Attending: Emergency Medicine | Admitting: Emergency Medicine

## 2015-09-23 DIAGNOSIS — S161XXA Strain of muscle, fascia and tendon at neck level, initial encounter: Secondary | ICD-10-CM

## 2015-09-23 DIAGNOSIS — S060X0A Concussion without loss of consciousness, initial encounter: Secondary | ICD-10-CM

## 2015-09-23 DIAGNOSIS — M5441 Lumbago with sciatica, right side: Secondary | ICD-10-CM

## 2015-09-23 HISTORY — DX: Unspecified osteoarthritis, unspecified site: M19.90

## 2015-09-23 HISTORY — DX: Type 2 diabetes mellitus without complications: E11.9

## 2015-09-23 MED ORDER — HYDROMORPHONE HCL 1 MG/ML IJ SOLN
1.0000 mg | Freq: Once | INTRAMUSCULAR | Status: AC
  Administered 2015-09-23: 1 mg via INTRAMUSCULAR
  Filled 2015-09-23: qty 1

## 2015-09-23 MED ORDER — CYCLOBENZAPRINE HCL 10 MG PO TABS: 10.0000 mg | ORAL_TABLET | Freq: Three times a day (TID) | ORAL | Status: AC | PRN

## 2015-09-23 MED ORDER — PREDNISONE 20 MG PO TABS: ORAL_TABLET | ORAL | Status: AC

## 2015-09-23 MED ORDER — ONDANSETRON 4 MG PO TBDP
8.0000 mg | ORAL_TABLET | Freq: Once | ORAL | Status: AC
  Administered 2015-09-23: 8 mg via ORAL
  Filled 2015-09-23: qty 2

## 2015-09-23 MED ORDER — HYDROCODONE-ACETAMINOPHEN 5-325 MG PO TABS: 1.0000 | ORAL_TABLET | ORAL | Status: AC | PRN

## 2015-09-23 NOTE — ED Notes (Signed)
Dr. Pollina at the bedside.  
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
## 2015-09-23 NOTE — Discharge Instructions (Signed)
Cervical Sprain  A cervical sprain is an injury in the neck in which the strong, fibrous tissues (ligaments) that connect your neck bones stretch or tear. Cervical sprains can range from mild to severe. Severe cervical sprains can cause the neck vertebrae to be unstable. This can lead to damage of the spinal cord and can result in serious nervous system problems. The amount of time it takes for a cervical sprain to get better depends on the cause and extent of the injury. Most cervical sprains heal in 1 to 3 weeks.  CAUSES   Severe cervical sprains may be caused by:    Contact sport injuries (such as from football, rugby, wrestling, hockey, auto racing, gymnastics, diving, martial arts, or boxing).    Motor vehicle collisions.    Whiplash injuries. This is an injury from a sudden forward and backward whipping movement of the head and neck.   Falls.   Mild cervical sprains may be caused by:    Being in an awkward position, such as while cradling a telephone between your ear and shoulder.    Sitting in a chair that does not offer proper support.    Working at a poorly designed computer station.    Looking up or down for long periods of time.   SYMPTOMS    Pain, soreness, stiffness, or a burning sensation in the front, back, or sides of the neck. This discomfort may develop immediately after the injury or slowly, 24 hours or more after the injury.    Pain or tenderness directly in the middle of the back of the neck.    Shoulder or upper back pain.    Limited ability to move the neck.    Headache.    Dizziness.    Weakness, numbness, or tingling in the hands or arms.    Muscle spasms.    Difficulty swallowing or chewing.    Tenderness and swelling of the neck.   DIAGNOSIS   Most of the time your health care provider can diagnose a cervical sprain by taking your history and doing a physical exam. Your health care provider will ask about previous neck injuries and any known neck  problems, such as arthritis in the neck. X-rays may be taken to find out if there are any other problems, such as with the bones of the neck. Other tests, such as a CT scan or MRI, may also be needed.   TREATMENT   Treatment depends on the severity of the cervical sprain. Mild sprains can be treated with rest, keeping the neck in place (immobilization), and pain medicines. Severe cervical sprains are immediately immobilized. Further treatment is done to help with pain, muscle spasms, and other symptoms and may include:   Medicines, such as pain relievers, numbing medicines, or muscle relaxants.    Physical therapy. This may involve stretching exercises, strengthening exercises, and posture training. Exercises and improved posture can help stabilize the neck, strengthen muscles, and help stop symptoms from returning.   HOME CARE INSTRUCTIONS    Put ice on the injured area.     Put ice in a plastic bag.     Place a towel between your skin and the bag.     Leave the ice on for 15-20 minutes, 3-4 times a day.    If your injury was severe, you may have been given a cervical collar to wear. A cervical collar is a two-piece collar designed to keep your neck from moving while it heals.      Do not remove the collar unless instructed by your health care provider.    If you have long hair, keep it outside of the collar.    Ask your health care provider before making any adjustments to your collar. Minor adjustments may be required over time to improve comfort and reduce pressure on your chin or on the back of your head.    Ifyou are allowed to remove the collar for cleaning or bathing, follow your health care provider's instructions on how to do so safely.    Keep your collar clean by wiping it with mild soap and water and drying it completely. If the collar you have been given includes removable pads, remove them every 1-2 days and hand wash them with soap and water. Allow them to air dry. They should be completely  dry before you wear them in the collar.    If you are allowed to remove the collar for cleaning and bathing, wash and dry the skin of your neck. Check your skin for irritation or sores. If you see any, tell your health care provider.    Do not drive while wearing the collar.    Only take over-the-counter or prescription medicines for pain, discomfort, or fever as directed by your health care provider.    Keep all follow-up appointments as directed by your health care provider.    Keep all physical therapy appointments as directed by your health care provider.    Make any needed adjustments to your workstation to promote good posture.    Avoid positions and activities that make your symptoms worse.    Warm up and stretch before being active to help prevent problems.   SEEK MEDICAL CARE IF:    Your pain is not controlled with medicine.    You are unable to decrease your pain medicine over time as planned.    Your activity level is not improving as expected.   SEEK IMMEDIATE MEDICAL CARE IF:    You develop any bleeding.   You develop stomach upset.   You have signs of an allergic reaction to your medicine.    Your symptoms get worse.    You develop new, unexplained symptoms.    You have numbness, tingling, weakness, or paralysis in any part of your body.   MAKE SURE YOU:    Understand these instructions.   Will watch your condition.   Will get help right away if you are not doing well or get worse.     This information is not intended to replace advice given to you by your health care provider. Make sure you discuss any questions you have with your health care provider.     Document Released: 08/15/2007 Document Revised: 10/23/2013 Document Reviewed: 04/25/2013  [employer]vier Interactive Patient Education 2016 [employer]  Concussion, Adult  A concussion, or closed-head injury, is a brain injury caused by a direct blow to the head or by a quick and sudden movement (jolt) of the head or  neck. Concussions are usually not life-threatening. Even so, the effects of a concussion can be serious. If you have had a concussion before, you are more likely to experience concussion-like symptoms after a direct blow to the head.   CAUSES   Direct blow to the head, such as from running into another player during a soccer game, being hit in a fight, or hitting your head on a hard surface.   A jolt of the head or neck that causes the brain   to move back and forth inside the skull, such as in a car crash.  SIGNS AND SYMPTOMS  The signs of a concussion can be hard to notice. Early on, they may be missed by you, family members, and health care providers. You may look fine but act or feel differently.  Symptoms are usually temporary, but they may last for days, weeks, or even longer. Some symptoms may appear right away while others may not show up for hours or days. Every head injury is different. Symptoms include:   Mild to moderate headaches that will not go away.   A feeling of pressure inside your head.   Having more trouble than usual:   Learning or remembering things you have heard.   Answering questions.   Paying attention or concentrating.   Organizing daily tasks.   Making decisions and solving problems.   Slowness in thinking, acting or reacting, speaking, or reading.   Getting lost or being easily confused.   Feeling tired all the time or lacking energy (fatigued).   Feeling drowsy.   Sleep disturbances.   Sleeping more than usual.   Sleeping less than usual.   Trouble falling asleep.   Trouble sleeping (insomnia).   Loss of balance or feeling lightheaded or dizzy.   Nausea or vomiting.   Numbness or tingling.   Increased sensitivity to:   Sounds.   Lights.   Distractions.   Vision problems or eyes that tire easily.   Diminished sense of taste or smell.   Ringing in the ears.   Mood changes such as feeling sad or anxious.   Becoming easily irritated or angry for little or no  reason.   Lack of motivation.   Seeing or hearing things other people do not see or hear (hallucinations).  DIAGNOSIS  Your health care provider can usually diagnose a concussion based on a description of your injury and symptoms. He or she will ask whether you passed out (lost consciousness) and whether you are having trouble remembering events that happened right before and during your injury.  Your evaluation might include:   A brain scan to look for signs of injury to the brain. Even if the test shows no injury, you may still have a concussion.   Blood tests to be sure other problems are not present.  TREATMENT   Concussions are usually treated in an emergency department, in urgent care, or at a clinic. You may need to stay in the hospital overnight for further treatment.   Tell your health care provider if you are taking any medicines, including prescription medicines, over-the-counter medicines, and natural remedies. Some medicines, such as blood thinners (anticoagulants) and aspirin, may increase the chance of complications. Also tell your health care provider whether you have had alcohol or are taking illegal drugs. This information may affect treatment.   Your health care provider will send you home with important instructions to follow.   How fast you will recover from a concussion depends on many factors. These factors include how severe your concussion is, what part of your brain was injured, your age, and how healthy you were before the concussion.   Most people with mild injuries recover fully. Recovery can take time. In general, recovery is slower in older persons. Also, persons who have had a concussion in the past or have other medical problems may find that it takes longer to recover from their current injury.  HOME CARE INSTRUCTIONS  General Instructions   Carefully follow   the directions your health care provider gave you.   Only take over-the-counter or prescription medicines for pain,  discomfort, or fever as directed by your health care provider.   Take only those medicines that your health care provider has approved.   Do not drink alcohol until your health care provider says you are well enough to do so. Alcohol and certain other drugs may slow your recovery and can put you at risk of further injury.   If it is harder than usual to remember things, write them down.   If you are easily distracted, try to do one thing at a time. For example, do not try to watch TV while fixing dinner.   Talk with family members or close friends when making important decisions.   Keep all follow-up appointments. Repeated evaluation of your symptoms is recommended for your recovery.   Watch your symptoms and tell others to do the same. Complications sometimes occur after a concussion. Older adults with a brain injury may have a higher risk of serious complications, such as a blood clot on the brain.   Tell your teachers, school nurse, school counselor, coach, athletic trainer, or work manager about your injury, symptoms, and restrictions. Tell them about what you can or cannot do. They should watch for:    Increased problems with attention or concentration.    Increased difficulty remembering or learning new information.    Increased time needed to complete tasks or assignments.    Increased irritability or decreased ability to cope with stress.    Increased symptoms.   Rest. Rest helps the brain to heal. Make sure you:    Get plenty of sleep at night. Avoid staying up late at night.    Keep the same bedtime hours on weekends and weekdays.    Rest during the day. Take daytime naps or rest breaks when you feel tired.   Limit activities that require a lot of thought or concentration. These include:    Doing homework or job-related work.    Watching TV.    Working on the computer.   Avoid any situation where there is potential for another head injury (football, hockey, soccer, basketball, martial arts,  downhill snow sports and horseback riding). Your condition will get worse every time you experience a concussion. You should avoid these activities until you are evaluated by the appropriate follow-up health care providers.  Returning To Your Regular Activities  You will need to return to your normal activities slowly, not all at once. You must give your body and brain enough time for recovery.   Do not return to sports or other athletic activities until your health care provider tells you it is safe to do so.   Ask your health care provider when you can drive, ride a bicycle, or operate heavy machinery. Your ability to react may be slower after a brain injury. Never do these activities if you are dizzy.   Ask your health care provider about when you can return to work or school.  Preventing Another Concussion  It is very important to avoid another brain injury, especially before you have recovered. In rare cases, another injury can lead to permanent brain damage, brain swelling, or death. The risk of this is greatest during the first 7-10 days after a head injury. Avoid injuries by:   Wearing a seat belt when riding in a car.   Drinking alcohol only in moderation.   Wearing a helmet when biking, skiing, skateboarding,   skating, or doing similar activities.   Avoiding activities that could lead to a second concussion, such as contact or recreational sports, until your health care provider says it is okay.   Taking safety measures in your home.   Remove clutter and tripping hazards from floors and stairways.   Use grab bars in bathrooms and handrails by stairs.   Place non-slip mats on floors and in bathtubs.   Improve lighting in dim areas.  SEEK MEDICAL CARE IF:   You have increased problems paying attention or concentrating.   You have increased difficulty remembering or learning new information.   You need more time to complete tasks or assignments than before.   You have increased irritability or  decreased ability to cope with stress.   You have more symptoms than before.  Seek medical care if you have any of the following symptoms for more than 2 weeks after your injury:   Lasting (chronic) headaches.   Dizziness or balance problems.   Nausea.   Vision problems.   Increased sensitivity to noise or light.   Depression or mood swings.   Anxiety or irritability.   Memory problems.   Difficulty concentrating or paying attention.   Sleep problems.   Feeling tired all the time.  SEEK IMMEDIATE MEDICAL CARE IF:   You have severe or worsening headaches. These may be a sign of a blood clot in the brain.   You have weakness (even if only in one hand, leg, or part of the face).   You have numbness.   You have decreased coordination.   You vomit repeatedly.   You have increased sleepiness.   One pupil is larger than the other.   You have convulsions.   You have slurred speech.   You have increased confusion. This may be a sign of a blood clot in the brain.   You have increased restlessness, agitation, or irritability.   You are unable to recognize people or places.   You have neck pain.   It is difficult to wake you up.   You have unusual behavior changes.   You lose consciousness.  MAKE SURE YOU:   Understand these instructions.   Will watch your condition.   Will get help right away if you are not doing well or get worse.     This information is not intended to replace advice given to you by your health care provider. Make sure you discuss any questions you have with your health care provider.     Document Released: 01/08/2004 Document Revised: 11/08/2014 Document Reviewed: 05/10/2013  [employer] Interactive Patient Education 2016 [employer]

## 2016-03-10 ENCOUNTER — Inpatient Hospital Stay
Admit: 2016-03-10 | Discharge: 2016-03-10 | Disposition: A | Discharge status: Home / Self Care | Attending: Emergency Medicine

## 2016-03-10 DIAGNOSIS — S161XXA Strain of muscle, fascia and tendon at neck level, initial encounter: Secondary | ICD-10-CM

## 2016-03-10 MED ORDER — CYCLOBENZAPRINE HCL 10 MG PO TABS
10 MG | Freq: Once | ORAL | Status: DC
Start: 2016-03-10 — End: 2016-03-10

## 2016-03-10 MED ORDER — NAPROXEN 500 MG PO TABS
500 MG | Freq: Once | ORAL | Status: AC
Start: 2016-03-10 — End: 2016-03-10
  Administered 2016-03-10: 06:00:00 500 mg via ORAL

## 2016-03-10 MED ORDER — DICLOFENAC SODIUM 75 MG PO TBEC
75 MG | ORAL_TABLET | Freq: Two times a day (BID) | ORAL | 0 refills | Status: DC | PRN
Start: 2016-03-10 — End: 2017-08-01

## 2016-03-10 MED ORDER — NAPROXEN 500 MG PO TABS
500 MG | Freq: Once | ORAL | Status: DC
Start: 2016-03-10 — End: 2016-03-10

## 2016-03-10 MED ORDER — CYCLOBENZAPRINE HCL 10 MG PO TABS
10 MG | ORAL_TABLET | Freq: Three times a day (TID) | ORAL | 0 refills | Status: DC | PRN
Start: 2016-03-10 — End: 2017-08-01

## 2016-03-10 MED FILL — NAPROXEN 500 MG PO TABS: 500 MG | ORAL | Qty: 1
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
## 2016-03-10 NOTE — ED Notes (Signed)
Pt had admitted to 3 shots and 2 sips of [place] went in to give pain med and pt sleeping not awakening for pain med informed dr Darlin Coco, RN  03/10/16 [phone]

## 2016-03-10 NOTE — ED Notes (Signed)
Ambulated to bathroom gait steady     Tildon Husky, RN  03/10/16 [phone]
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
## 2016-03-10 NOTE — ED Notes (Signed)
Nurse gave the patient an ice pack and the patient refused to place the ice pack on her face, because the patient states she is cold. Patient given a warm blanket.     Delman Cheadle, RN  03/10/16 [phone]
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
## 2016-03-10 NOTE — ED Notes (Signed)
Continues to sleep resp easy      Tildon Husky, RN  03/10/16 0126

## 2016-03-10 NOTE — ED Notes (Signed)
States has no ride home unhappy with pain med explained has been drinking and cannot give anything stronger here. Has rx for other meds continues to talk on phone     Tildon Husky, RN  03/10/16 [phone]

## 2016-03-10 NOTE — ED Notes (Signed)
Now at front desk demanding to see dr again.  Explained no meds would be changed if signed back in states meds will not work for her pain. Taxi cancelled     [employer], RN  03/10/16 [phone]

## 2016-03-10 NOTE — ED Notes (Signed)
Awake alert asking for pain med demanding a pad for menses. Talking to someone on phone cursing every other word.     Tildon Husky, RN  03/10/16 [phone]

## 2016-03-10 NOTE — ED Notes (Signed)
Did not take ice at d/c called taxi for transport at home     Tildon Husky, [place]  03/10/16 [phone]

## 2016-03-27 ENCOUNTER — Inpatient Hospital Stay
Admit: 2016-03-27 | Discharge: 2016-03-27 | Disposition: A | Discharge status: Home / Self Care | Attending: Emergency Medicine

## 2016-03-27 DIAGNOSIS — T40601A Poisoning by unspecified narcotics, accidental (unintentional), initial encounter: Secondary | ICD-10-CM

## 2016-03-27 MED ORDER — ACETAMINOPHEN 325 MG PO TABS
325 MG | Freq: Once | ORAL | Status: AC
Start: 2016-03-27 — End: 2016-03-27
  Administered 2016-03-27: 23:00:00 650 mg via ORAL

## 2016-03-27 MED ORDER — NALOXONE OPIATE OVERDOSE KIT
PACK | Freq: Once | 0 refills | Status: AC
Start: 2016-03-27 — End: 2016-03-27

## 2016-03-27 MED FILL — ACETAMINOPHEN 325 MG PO TABS: 325 MG | ORAL | Qty: 2

## 2016-03-27 NOTE — ED Provider Notes (Signed)
ED Attending Attestation Note     Date of evaluation: 03/27/2016    This patient was seen by the resident physician.  I have seen and examined the patient, I agree with the workup, evaluation, management and diagnosis. The care plan has been discussed and I concur.  My assessment reveals Patient here for a heroin overdose.  Patient was given Narcan by the life squad and now is awake and alert.  On exam vitals unremarkable chest clear.       Smitty Cords, MD  03/28/16 [phone]

## 2016-03-27 NOTE — ED Provider Notes (Signed)
Date of evaluation: 03/27/2016    Chief Complaint   Drug Overdose (Pt was given 2mg  of narcan nasal spray. )      Nursing Notes, Past Medical Hx, Past Surgical Hx, Social Hx, Allergies, and Family Hx were reviewed.    History of Present Illness     [name] [name] is a 35 y.o. female with a history of chronic back pain, depression, diabetes, and bipolar disorder who presents apparent heroin overdose via snorting heroin.  She was reportedly given 2 mg of IM Narcan, which took somewhat longer to start working.  Therefore, she required a nasal airway as well as bag valve mask ventilation.   Ultimately, the patient recovered and was alert and oriented when she presented to the emergency department.  She endorses right-sided ankle pain as well as headache.  The EMS squad reported that she was lying in an odd position.  She did say that she started this heroin probably 2 hours ago.  The Narcan was given at 1530.  She denies any suicidal or homicidal ideation.  She reports she did not do this on purpose and that she only took a small dose.  She denies any fevers, nausea, vomiting, or diarrhea.    Review of Systems     Review of Systems   All other review systems are negative per patient.    Past Medical, Surgical, Family, and Social History         Diagnosis Date   ??? Anxiety    ??? Bipolar affective ([place])    ??? Bipolar affective disorder ([place])    ??? Cervical fusion syndrome    ??? Chronic back pain greater than 3 months duration    ??? Chronic pain    ??? Depression    ??? Gestational diabetes mellitus    ??? Gestational diabetes mellitus in childbirth     with pregnancy   ??? 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   ??? Miscarriage    ??? OCD (obsessive compulsive disorder)    ??? Paranoia ([place])          Procedure Laterality Date   ??? CERVICAL DISC SURGERY     ??? CERVICAL SPINE SURGERY      C1-C2 fusion 2003   ??? CESAREAN SECTION     ??? ECTOPIC PREGNANCY SURGERY       Her family history includes Arthritis in an other family member; Asthma in  an other family member; Cancer in an other family member; Diabetes in an other family member; Kidney Disease in an other family member.  She reports that she has been smoking Cigarettes.  She has a 5.50 pack-year smoking history. She has never used smokeless tobacco. She reports that she drinks alcohol. She reports that she uses illicit drugs, including Cocaine and Opiates .    Medications     Previous Medications    CITALOPRAM (CELEXA) 20 MG TABLET    Take 40 mg by mouth daily.    CYCLOBENZAPRINE (FLEXERIL) 10 MG TABLET    Take 1 tablet by mouth 3 times daily as needed for Muscle spasms    DICLOFENAC (VOLTAREN) 75 MG EC TABLET    Take 1 tablet by mouth 2 times daily as needed for Pain    GABAPENTIN (NEURONTIN PO)    Take by mouth    TRAZODONE (DESYREL) 150 MG TABLET    Take 150 mg by mouth nightly.       Allergies     She is allergic  to latex; latex; cephalexin; and flagyl [metronidazole].    Physical Exam     INITIAL VITALS: BP 103/63   Pulse 115   Temp 98.8 ??F (37.1 ??C) (Oral)    Resp 18   LMP 03/09/2016   SpO2 90%   Physical Exam   Constitutional: She appears well-developed and well-nourished. No distress.   HENT:   Head: Normocephalic and atraumatic.   Eyes: EOM are normal. Pupils are equal, round, and reactive to light. Right eye exhibits no discharge. Left eye exhibits no discharge.   Pupils are 69mm and reactive     Neck: Normal range of motion. Neck supple. No tracheal deviation present. No thyromegaly present.   Cardiovascular: Normal rate, regular rhythm, normal heart sounds and intact distal pulses.  Exam reveals no gallop and no friction rub.    No murmur heard.  Pulmonary/Chest: Effort normal and breath sounds normal. No respiratory distress. She has no wheezes. She has no rales. She exhibits no tenderness.   Abdominal: Soft. Bowel sounds are normal. She exhibits no distension. There is no tenderness. There is no rebound and no guarding.   Musculoskeletal: Normal range of motion. She exhibits no edema,  tenderness or deformity.   Neurological: She is alert.   Skin: Skin is warm and dry. No rash noted. She is not diaphoretic. No erythema. No pallor.   Nursing note and vitals reviewed.      Diagnostic Results     EKG   No EKG performed    RADIOLOGY:  No orders to display         LABS:   Labs Reviewed - No data to display    ED BEDSIDE ULTRASOUND:  No ultrasound performed    RECENT VITALS:  BP: 103/63, Temp: 98.8 ??F (37.1 ??C), Pulse: 115, Resp: 18     Procedures         ED Course     The patient was given the following medications:  Orders Placed This Encounter   Medications   ??? acetaminophen (TYLENOL) tablet 650 mg       ED Course       CONSULTS:  None    MEDICAL DECISION MAKING     [name] [name] is a 35 y.o. female who presents after narcotic overdose requiring Narcan administration.  The patient had no other obvious deformities or trauma to her.  The patient was observed for a period of 2 hours and discharged with prescription for Narcan.  She is also advised to follow-up with a rehab center.  She was given Tylenol for headache, which helped her headache.  She was discharged with strict return precautions and had stable vital signs.    This patient was also evaluated by the attending physician. All care plans were discussed and agreed upon.    Clinical Impression     No diagnosis found.    Disposition/Plan     PATIENT REFERRED TO:  No follow-up provider specified.    DISCHARGE MEDICATIONS:  New Prescriptions    No medications on file       DISPOSITION        Ival Bible, MD  Resident  03/27/16 [phone]

## 2016-04-09 ENCOUNTER — Inpatient Hospital Stay
Admit: 2016-04-09 | Discharge: 2016-04-09 | Disposition: A | Discharge status: Home / Self Care | Payer: PRIVATE HEALTH INSURANCE

## 2016-04-09 DIAGNOSIS — T401X1A Poisoning by heroin, accidental (unintentional), initial encounter: Secondary | ICD-10-CM

## 2016-04-09 MED ORDER — naloxone (NARCAN) 4 mg/actuation Spry
4 | NASAL | Status: AC
Start: 2016-04-09 — End: 2018-07-23

## 2016-04-09 NOTE — Unmapped (Signed)
ED Attending Attestation Note    Date of service:  04/09/2016    This patient was seen by the resident physician.  I have seen and examined the patient, agree with the workup, evaluation, management and diagnosis. The care plan has been discussed and I concur.     My assessment reveals a 35 y.o. female presents s/p opioid OD, was not given narcan.  Exam reveals a somnolent female, no distress, arousable.

## 2016-04-09 NOTE — Unmapped (Signed)
Patient presents to the Emergency Department with report of patient found minimally responsive.  Patient drowsy at triage.  Patient A&OX4.  No acute distress noted.  Respirations easy and unlabored.  Skin warm and dry.

## 2016-04-09 NOTE — Unmapped (Signed)
Naloxone nasal spray  What is this medicine?  NALOXONE (nal OX one) is a narcotic blocker. It is used to treat narcotic drug overdose.  This medicine may be used for other purposes; ask your health care provider or pharmacist if you have questions.  What should I tell my health care provider before I take this medicine?  They need to know if you have any of these conditions:  -drug abuse or addiction  -heart disease  -an unusual or allergic reaction to naloxone, other medicines, foods, dyes, or preservatives  -pregnant or trying to get pregnant  -breast-feeding  How should I use this medicine?  This medicine is for use in the nose. Lay the person on their back. Support their neck with your hand and allow the head to tilt back before giving the medicine. The nasal spray should be given into 1 nostril. After giving the medicine, move the person onto their side. Do not remove or test the nasal spray until ready to use.  Get emergency medical help right away after giving the first dose of this medicine, even if the person wakes up. You should be familiar with how to recognize the signs and symptoms of a narcotic overdose. If more doses are needed, give the additional dose in the other nostril.  Talk to your pediatrician regarding the use of this medicine in children. While this drug may be prescribed for children as young as newborns for selected conditions, precautions do apply.  Overdosage: If you think you have taken too much of this medicine contact a poison control center or emergency room at once.  NOTE: This medicine is only for you. Do not share this medicine with others.  What if I miss a dose?  This does not apply.  What may interact with this medicine?  This medicine is only used during an emergency. No interactions are expected during emergency use.  This list may not describe all possible interactions. Give your health care provider a list of all the medicines, herbs, non-prescription drugs, or dietary  supplements you use. Also tell them if you smoke, drink alcohol, or use illegal drugs. Some items may interact with your medicine.  What should I watch for while using this medicine?  Keep this medicine ready for use in the case of a narcotic overdose. Make sure that you have the phone number of your doctor or health care professional and local hospital ready. You may need to have additional doses of this medicine. Each nasal spray contains a single dose. Some emergencies may require additional doses.  After use, bring the treated person to the nearest hospital or call 911. Make sure the treating health care professional knows that the person has received an injection of this medicine. You will receive additional instructions on what to do during and after use of this medicine before an emergency occurs.  What side effects may I notice from receiving this medicine?  Side effects that you should report to your doctor or health care professional as soon as possible:  -allergic reactions like skin rash, itching or hives, swelling of the face, lips, or tongue  -breathing problems  -fast, irregular heartbeat  -high blood pressure  -pain that was controlled by narcotic pain medicine  -seizures  Side effects that usually do not require medical attention (report these to your doctor or health care professional if they continue or are bothersome):  -anxious  -chills  -diarrhea  -fever  -headache  -muscle pain  -nausea, vomiting  -  nose irritation like dryness, congestion, and swelling  -sweating  This list may not describe all possible side effects. Call your doctor for medical advice about side effects. You may report side effects to FDA at 1-800-FDA-1088.  Where should I keep my medicine?  Keep out of the reach of children.  Store between 4 and 40 degrees C (39 and 104 degrees F). Do not freeze. Throw away any unused medicine after the expiration date. Keep in original box until ready to use.  NOTE: This sheet is a summary.  It may not cover all possible information. If you have questions about this medicine, talk to your doctor, pharmacist, or health care provider.     ?? 2016, [employer] Standard. (2014-10-04 14:24:03)

## 2016-04-09 NOTE — Unmapped (Signed)
Pt brought to the ED by EMS.  Per EMS pt was found down in hallway in an apartment complex.  Per EMS this is there fourth time this week finding this pt and taking her to the hospital.  EMS did not give any Narcan today.  Pt did tell MD Rubye Oaks that she used heroin today.  Pt told MD Primitivo Gauze that she snorted the heroin.  Pt is drowsy, awakens to voice.  Pt has slurred speech.  Pt does not answer questions.  Pt continues to fall asleep.  Pt with spo2 monitoring at this time there are no cardiac beds available.  RN looked for a portable monitor and did find the monitor, however, the boxes were missing, RN Sierrah Luevano spoke with charge RN tina.  Charge RN tina stated that the boxes have been missing and to try to locate a monitor that does not need a box.  RN Benny Lennert was unsuccessful at finding a monitor that does not need a box.  RN Benny Lennert used our Nurse on wheels monitor to monitor pt spo2, pulse.

## 2016-04-09 NOTE — Unmapped (Signed)
[place] ED Note    Date of Service:  04/09/2016  Reason for Visit: Altered Mental Status      Patient History     HPI:  Anne Goodwin is a 35 y.o. female with a history of Bipolar disorder, Hashimoto's thyroiditis, diabetes, heroin abuse, who presents with concerns of accidental heroin overdose.  Patient reports that she snorted heroin late this morning prior to presentation.  This was an accidental increased dose for her.  She was found passed out in a hallway by EMS, but she was arousable to voice and they did not provide any Narcan.  She denies wanting to harm herself.  She denies any coingestions.     Past Medical History   Diagnosis Date   ??? Back pain    ??? Diabetes mellitus    ??? OCD (obsessive compulsive disorder)    ??? Anxiety    ??? Bipolar disorder    ??? Depression    ??? Abnormal Pap smear    ??? Pituitary adenoma    ??? Hashimoto's thyroiditis    ??? Ectopic pregnancy        Past Surgical History   Procedure Laterality Date   ??? Cesarean section     ??? Cervical fusion         Anne Goodwin  reports that she has been smoking Cigarettes.  She has been smoking about 0.50 packs per day. She does not have any smokeless tobacco history on file. She reports that she uses illicit drugs (Heroin) about once per week. She reports that she does not drink alcohol.    Previous Medications     Previous Medications    ACETAMINOPHEN (TYLENOL) 650 MG SUPPOSITORY    Place 650 mg rectally every 4 hours as needed.    CITALOPRAM (CELEXA) 10 MG TABLET    Take 10 mg by mouth daily.    CYCLOBENZAPRINE (FLEXERIL) 10 MG TABLET    Take 10 mg by mouth 3 times a day as needed for Muscle spasms.    DOCUSATE SODIUM (COLACE) 100 MG CAPSULE    Take 100 mg by mouth 2 times a day.    FOLIC ACID (FOLVITE) 1 MG TABLET    Take 1 mg by mouth daily.    GABAPENTIN (NEURONTIN) 300 MG CAPSULE        GLYBURIDE (DIABETA) 5 MG TABLET    Take 5 mg by mouth daily with breakfast.     HYDROCODONE-ACETAMINOPHEN (NORCO) 5-325 MG PER TABLET        HYDROXYZINE PAMOATE (VISTARIL) 50 MG CAPSULE    Take 1 capsule (50 mg total) by mouth every 3 hours as needed for Itching.    MELOXICAM (MOBIC) 15 MG TABLET        METFORMIN (GLUCOPHAGE) 1000 MG TABLET        ONDANSETRON (ZOFRAN-ODT) 4 MG DISINTEGRATING TABLET    Take 4 mg by mouth every 8 hours as needed for Nausea.    OXYCODONE-ACETAMINOPHEN (PERCOCET) 5-325 MG PER TABLET        PNV WITH CALCIUM NO.72-IRON-FA (PRENATAL PLUS WITH IRON, CA,) 27 MG IRON- 1 MG TAB TABLET    Take 1 tablet by mouth daily.    TRAZODONE (DESYREL) 50 MG    Take 25 mg by mouth at bedtime.       Allergies:   Allergies as of 04/09/2016 - Fully Reviewed 04/09/2016   Allergen Reaction Noted   ??? Flagyl [metronidazole]  04/04/2013   ??? Keflex [cephalexin]  04/04/2013   ???  Latex, natural rubber  04/04/2013       Review of Systems     ROS:  Review of Systems   Constitutional: Negative.    HENT: Negative.    Eyes: Negative.    Respiratory: Negative.    Cardiovascular: Negative.    Gastrointestinal: Negative.    Genitourinary: Negative.    Musculoskeletal: Negative.    Skin: Negative.    Neurological: Negative.    Psychiatric/Behavioral: Positive for substance abuse. Negative for depression and suicidal ideas.     Physical Exam     Filed Vitals:    04/09/16 1140   BP: 124/69   Pulse: 96   Temp: 98.3 ??F (36.8 ??C)   Resp: 12   SpO2: 98%       Physical Exam   Constitutional: She appears well-developed and well-nourished. No distress.   Tired in appearance, but in no acute distress   HENT:   Head: Normocephalic and atraumatic.   Mouth/Throat: Oropharynx is clear and moist.   Eyes: Conjunctivae and EOM are normal. Pupils are equal, round, and reactive to light. No scleral icterus.   Neck: Normal range of motion. Neck supple.   Cardiovascular: Normal rate, regular rhythm, normal heart sounds and intact distal pulses.    No murmur heard.  Pulmonary/Chest: Effort normal and breath sounds normal. No  respiratory distress.   Abdominal: Soft. She exhibits no distension. There is no tenderness.   Musculoskeletal: Normal range of motion. She exhibits no edema.   Neurological:   Drowsy but easily arousable to soft voice.  Oriented ??3.  Normal gait without assistance.  Moves all 4 extremities with good strength.   Skin: Skin is warm and dry. She is not diaphoretic.   Psychiatric: She has a normal mood and affect. Her behavior is normal.   Vitals reviewed.    Diagnostic Studies     Labs:    No tests were performed during this ED visit     Radiology:    No tests were performed during this ED visit    EKG:    No EKG Performed    Emergency Department Procedures     None.    ED Course and MDM     Anne Goodwin is a 35 y.o. female who presented to the emergency department with Altered Mental Status      A broad differential diagnosis was considered.  Appropriate testing was ordered as indicated.  Previous records, including previous laboratory studies and radiographic studies, were reviewed as available and appropriate.    Patient presented with very minimal accidental heroin overdose.  Did not require Narcan here.  Observed for a period of time until she was more alert and arousable.  She was discharged at that time.  Offered resources.  Narcan prescription provided.  Return precautions discussed.    At this time, the patient was deemed safe for discharge from the emergency department, with appropriate follow-up arranged as indicated in the medical record.  Appropriate return precautions were provided and discussed with the patient.  The patient verbalized understanding.     The disposition plan was discussed with the patient, all questions were answered and the patient verbalized understanding of the disposition plan.    This patient was evaluated by and discussed with the attending, Dr. Rubye Oaks, who agrees with the assessment and plan as documented.    Diagnosis:      1. Accidental heroin overdose, initial  encounter      Disposition: Discharge  Ripley Fraise, MD, PhD  Emergency Medicine      Ripley Fraise, MD  Resident  04/09/16 [phone]
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
## 2017-08-01 ENCOUNTER — Emergency Department: Admit: 2017-08-01 | Payer: PRIVATE HEALTH INSURANCE | Primary: Family Medicine

## 2017-08-01 ENCOUNTER — Inpatient Hospital Stay
Admission: EM | Admit: 2017-08-01 | Discharge: 2017-08-04 | Disposition: A | Discharge status: Home / Self Care | Payer: PRIVATE HEALTH INSURANCE | Admitting: Internal Medicine

## 2017-08-01 ENCOUNTER — Inpatient Hospital Stay
Admission: EM | Admit: 2017-08-01 | Discharge: 2017-08-01 | Discharge status: Against Medical Advice | Payer: PRIVATE HEALTH INSURANCE | Source: Other Acute Inpatient Hospital | Admitting: Family Medicine

## 2017-08-01 DIAGNOSIS — A419 Sepsis, unspecified organism: Principal | ICD-10-CM

## 2017-08-01 LAB — CBC
Hematocrit: 33.1 % — ABNORMAL LOW (ref 36.0–48.0)
Hemoglobin: 10.8 g/dL — ABNORMAL LOW (ref 12.0–16.0)
MCH: 28.6 pg (ref 26.0–34.0)
MCHC: 32.7 g/dL (ref 31.0–36.0)
MCV: 87.3 fL (ref 80.0–100.0)
MPV: 8.4 fL (ref 5.0–10.5)
Platelets: 141 10*3/uL (ref 135–450)
RBC: 3.79 M/uL — ABNORMAL LOW (ref 4.00–5.20)
RDW: 13.6 % (ref 12.4–15.4)
WBC: 12.4 10*3/uL — ABNORMAL HIGH (ref 4.0–11.0)

## 2017-08-01 LAB — URINE DRUG SCREEN
Amphetamine Screen, Urine: POSITIVE — AB
Barbiturate Screen, Ur: NEGATIVE (ref <200)
Benzodiazepine Screen, Urine: POSITIVE — AB (ref <200)
Cannabinoid Scrn, Ur: POSITIVE — AB (ref <50)
Cocaine Metabolite Screen, Urine: POSITIVE — AB (ref <300)
Methadone Screen, Urine: NEGATIVE (ref <300)
Opiate Scrn, Ur: POSITIVE — AB (ref <300)
Oxycodone Urine: NEGATIVE (ref <100)
PCP Screen, Urine: NEGATIVE (ref <25)
Propoxyphene Scrn, Ur: NEGATIVE (ref <300)
pH, UA: 6

## 2017-08-01 LAB — URINALYSIS
Bilirubin Urine: NEGATIVE
Blood, Urine: NEGATIVE
Blood, Urine: NEGATIVE
Glucose, Ur: NEGATIVE mg/dL
Glucose, Ur: NEGATIVE mg/dL
Ketones, Urine: NEGATIVE mg/dL
Leukocyte Esterase, Urine: NEGATIVE
Leukocyte Esterase, Urine: NEGATIVE
Nitrite, Urine: NEGATIVE
Nitrite, Urine: NEGATIVE
Protein, UA: NEGATIVE mg/dL
Specific Gravity, UA: 1.025 (ref 1.005–1.030)
Specific Gravity, UA: >1.03 (ref 1.005–1.030)
Urobilinogen, Urine: 1 E.U./dL (ref <2.0)
Urobilinogen, Urine: 1 E.U./dL (ref <2.0)
pH, UA: 6 (ref 5.0–8.0)
pH, UA: 5.5 (ref 5.0–8.0)

## 2017-08-01 LAB — COMPREHENSIVE METABOLIC PANEL
ALT: 81 U/L — ABNORMAL HIGH (ref 10–40)
AST: 108 U/L — ABNORMAL HIGH (ref 15–37)
Albumin/Globulin Ratio: 1.2 (ref 1.1–2.2)
Albumin: 3.1 g/dL — ABNORMAL LOW (ref 3.4–5.0)
Alkaline Phosphatase: 107 U/L (ref 40–129)
Anion Gap: 9 (ref 3–16)
BUN: 12 mg/dL (ref 7–20)
CO2: 24 mmol/L (ref 21–32)
Calcium: 8 mg/dL — ABNORMAL LOW (ref 8.3–10.6)
Chloride: 101 mmol/L (ref 99–110)
Creatinine: <0.5 mg/dL — ABNORMAL LOW (ref 0.6–1.1)
GFR African American: >60 (ref >60)
GFR Non-African American: >60 (ref >60)
Globulin: 2.5 g/dL
Glucose: 106 mg/dL — ABNORMAL HIGH (ref 70–99)
Potassium: 4.9 mmol/L (ref 3.5–5.1)
Sodium: 134 mmol/L — ABNORMAL LOW (ref 136–145)
Total Bilirubin: 0.3 mg/dL (ref 0.0–1.0)
Total Protein: 5.6 g/dL — ABNORMAL LOW (ref 6.4–8.2)

## 2017-08-01 LAB — LIPASE: Lipase: 17 U/L (ref 13.0–60.0)

## 2017-08-01 LAB — CBC WITH AUTO DIFFERENTIAL
Basophils %: 0.2 %
Basophils Absolute: 0 10*3/uL (ref 0.0–0.2)
Eosinophils %: 0.2 %
Eosinophils Absolute: 0 10*3/uL (ref 0.0–0.6)
Hematocrit: 37 % (ref 36.0–48.0)
Hemoglobin: 12.7 g/dL (ref 12.0–16.0)
Lymphocytes %: 8.4 %
Lymphocytes Absolute: 0.4 10*3/uL — ABNORMAL LOW (ref 1.0–5.1)
MCH: 29.4 pg (ref 26.0–34.0)
MCHC: 34.2 g/dL (ref 31.0–36.0)
MCV: 86.1 fL (ref 80.0–100.0)
MPV: 8 fL (ref 5.0–10.5)
Monocytes %: 0.5 %
Monocytes Absolute: 0 10*3/uL (ref 0.0–1.3)
Neutrophils %: 90.7 %
Neutrophils Absolute: 3.8 10*3/uL (ref 1.7–7.7)
Platelets: 143 10*3/uL (ref 135–450)
RBC: 4.3 M/uL (ref 4.00–5.20)
RDW: 13.2 % (ref 12.4–15.4)
WBC: 4.2 10*3/uL (ref 4.0–11.0)

## 2017-08-01 LAB — LACTIC ACID
Lactic Acid: 2.3 mmol/L — ABNORMAL HIGH (ref 0.4–2.0)
Lactic Acid: 1 mmol/L (ref 0.4–2.0)

## 2017-08-01 LAB — MAGNESIUM
Magnesium: 1.3 mg/dL — ABNORMAL LOW (ref 1.80–2.40)
Magnesium: 2.2 mg/dL (ref 1.80–2.40)

## 2017-08-01 LAB — TROPONIN: Troponin: <0.01 ng/mL (ref <0.01)

## 2017-08-01 LAB — COMPREHENSIVE METABOLIC PANEL W/ REFLEX TO MG FOR LOW K
ALT: 87 U/L — ABNORMAL HIGH (ref 10–40)
AST: 195 U/L — ABNORMAL HIGH (ref 15–37)
Albumin/Globulin Ratio: 1.6 (ref 1.1–2.2)
Albumin: 4.2 g/dL (ref 3.4–5.0)
Alkaline Phosphatase: 152 U/L — ABNORMAL HIGH (ref 40–129)
Anion Gap: 14 (ref 3–16)
BUN: 12 mg/dL (ref 7–20)
CO2: 26 mmol/L (ref 21–32)
Calcium: 9.2 mg/dL (ref 8.3–10.6)
Chloride: 97 mmol/L — ABNORMAL LOW (ref 99–110)
Creatinine: 0.7 mg/dL (ref 0.6–1.1)
GFR African American: >60 (ref >60)
GFR Non-African American: >60 (ref >60)
Globulin: 2.7 g/dL
Glucose: 134 mg/dL — ABNORMAL HIGH (ref 70–99)
Potassium reflex Magnesium: 3.2 mmol/L — ABNORMAL LOW (ref 3.5–5.1)
Sodium: 137 mmol/L (ref 136–145)
Total Bilirubin: 1 mg/dL (ref 0.0–1.0)
Total Protein: 6.9 g/dL (ref 6.4–8.2)

## 2017-08-01 LAB — LACTATE, SEPSIS
Lactic Acid, Sepsis: 2.4 mmol/L — ABNORMAL HIGH (ref 0.4–1.9)
Lactic Acid, Sepsis: 1.2 mmol/L (ref 0.4–1.9)

## 2017-08-01 LAB — MICROSCOPIC URINALYSIS

## 2017-08-01 LAB — ETHANOL
Ethanol Lvl: NOT DETECTED mg/dL
Ethanol Lvl: NOT DETECTED mg/dL

## 2017-08-01 LAB — HCG, SERUM, QUALITATIVE: hCG Qual: NEGATIVE

## 2017-08-01 MED ORDER — DEXTROSE 5 % IV SOLN
5 | Freq: Two times a day (BID) | INTRAVENOUS | Status: DC
Start: 2017-08-01 — End: 2017-08-01

## 2017-08-01 MED ORDER — NORMAL SALINE FLUSH 0.9 % IV SOLN
0.9 | INTRAVENOUS | Status: DC | PRN
Start: 2017-08-01 — End: 2017-08-01

## 2017-08-01 MED ORDER — DICYCLOMINE HCL 20 MG PO TABS
20 MG | Freq: Four times a day (QID) | ORAL | Status: DC | PRN
Start: 2017-08-01 — End: 2017-08-04
  Administered 2017-08-02 – 2017-08-03 (×3): 20 mg via ORAL

## 2017-08-01 MED ORDER — CLONIDINE HCL 0.1 MG PO TABS
0.1 MG | ORAL | Status: DC | PRN
Start: 2017-08-01 — End: 2017-08-04
  Administered 2017-08-02 – 2017-08-03 (×3): 0.1 mg via ORAL

## 2017-08-01 MED ORDER — ACETAMINOPHEN 325 MG PO TABS
325 MG | ORAL | Status: DC | PRN
Start: 2017-08-01 — End: 2017-08-01

## 2017-08-01 MED ORDER — VANCOMYCIN INTERMITTENT DOSING (PLACEHOLDER)
INTRAVENOUS | Status: DC
Start: 2017-08-01 — End: 2017-08-01

## 2017-08-01 MED ORDER — SODIUM CHLORIDE 0.9 % IV SOLN
0.9 % | INTRAVENOUS | Status: DC
Start: 2017-08-01 — End: 2017-08-03
  Administered 2017-08-01 – 2017-08-03 (×5): via INTRAVENOUS

## 2017-08-01 MED ORDER — CYCLOBENZAPRINE HCL 10 MG PO TABS
10 MG | Freq: Three times a day (TID) | ORAL | Status: DC | PRN
Start: 2017-08-01 — End: 2017-08-01

## 2017-08-01 MED ORDER — MAGNESIUM SULFATE 2000 MG/50 ML IVPB PREMIX
2 GM/50ML | Freq: Once | INTRAVENOUS | Status: AC
Start: 2017-08-01 — End: 2017-08-01
  Administered 2017-08-01: 06:00:00 2 g via INTRAVENOUS

## 2017-08-01 MED ORDER — HYDROXYZINE PAMOATE 25 MG PO CAPS
25 MG | Freq: Three times a day (TID) | ORAL | Status: DC | PRN
Start: 2017-08-01 — End: 2017-08-04
  Administered 2017-08-03 (×2): 50 mg via ORAL

## 2017-08-01 MED ORDER — NORMAL SALINE FLUSH 0.9 % IV SOLN
0.9 % | Freq: Two times a day (BID) | INTRAVENOUS | Status: DC
Start: 2017-08-01 — End: 2017-08-04
  Administered 2017-08-03 – 2017-08-04 (×4): 10 mL via INTRAVENOUS

## 2017-08-01 MED ORDER — ENOXAPARIN SODIUM 40 MG/0.4ML SC SOLN
40 | Freq: Every day | SUBCUTANEOUS | Status: DC
Start: 2017-08-01 — End: 2017-08-01

## 2017-08-01 MED ORDER — PIPERACILLIN SOD-TAZOBACTAM SO 3.375 (3-0.375) G IV SOLR
3.3753-0.375 (3-0.375) g | Freq: Once | INTRAVENOUS | Status: AC
Start: 2017-08-01 — End: 2017-08-01
  Administered 2017-08-01: 06:00:00 3.375 g via INTRAVENOUS

## 2017-08-01 MED ORDER — IBUPROFEN 400 MG PO TABS
400 MG | Freq: Three times a day (TID) | ORAL | Status: DC | PRN
Start: 2017-08-01 — End: 2017-08-01

## 2017-08-01 MED ORDER — ENOXAPARIN SODIUM 40 MG/0.4ML SC SOLN
40 MG/0.4ML | Freq: Every day | SUBCUTANEOUS | Status: DC
Start: 2017-08-01 — End: 2017-08-04
  Administered 2017-08-02 – 2017-08-04 (×3): 40 mg via SUBCUTANEOUS

## 2017-08-01 MED ORDER — DEXTROSE 5 % IV SOLN (MINI-BAG)
5 % | Freq: Once | INTRAVENOUS | Status: AC
Start: 2017-08-01 — End: 2017-08-01
  Administered 2017-08-01: 20:00:00 3.375 g via INTRAVENOUS

## 2017-08-01 MED ORDER — SODIUM CHLORIDE 0.9 % IV BOLUS
0.9 % | Freq: Once | INTRAVENOUS | Status: AC
Start: 2017-08-01 — End: 2017-08-01
  Administered 2017-08-01: 19:00:00 1000 mL via INTRAVENOUS

## 2017-08-01 MED ORDER — TRAMADOL HCL 50 MG PO TABS
50 MG | ORAL | Status: DC | PRN
Start: 2017-08-01 — End: 2017-08-01

## 2017-08-01 MED ORDER — ACETAMINOPHEN 650 MG RE SUPP
650 MG | RECTAL | Status: DC | PRN
Start: 2017-08-01 — End: 2017-08-01
  Administered 2017-08-01: 05:00:00 650 mg via RECTAL

## 2017-08-01 MED ORDER — VANCOMYCIN HCL 1000 MG IV SOLR
1000 MG | Freq: Once | INTRAVENOUS | Status: AC
Start: 2017-08-01 — End: 2017-08-01
  Administered 2017-08-01: 05:00:00 1000 mg via INTRAVENOUS

## 2017-08-01 MED ORDER — CITALOPRAM HYDROBROMIDE 20 MG PO TABS
20 MG | Freq: Every day | ORAL | Status: DC
Start: 2017-08-01 — End: 2017-08-01

## 2017-08-01 MED ORDER — CITALOPRAM HYDROBROMIDE 20 MG PO TABS
20 MG | Freq: Every day | ORAL | Status: DC
Start: 2017-08-01 — End: 2017-08-04
  Administered 2017-08-03: 14:00:00 20 mg via ORAL

## 2017-08-01 MED ORDER — POTASSIUM CHLORIDE ER 10 MEQ PO TBCR
10 MEQ | Freq: Once | ORAL | Status: AC
Start: 2017-08-01 — End: 2017-08-01
  Administered 2017-08-01: 07:00:00 40 meq via ORAL

## 2017-08-01 MED ORDER — SODIUM CHLORIDE 0.9 % IV BOLUS
0.9 % | Freq: Once | INTRAVENOUS | Status: AC
Start: 2017-08-01 — End: 2017-08-01
  Administered 2017-08-01: 12:00:00 1000 mL via INTRAVENOUS

## 2017-08-01 MED ORDER — TRAZODONE HCL 50 MG PO TABS
50 MG | Freq: Every evening | ORAL | Status: DC
Start: 2017-08-01 — End: 2017-08-01

## 2017-08-01 MED ORDER — VANCOMYCIN HCL 750 MG IV SOLR
750 MG | Freq: Two times a day (BID) | INTRAVENOUS | Status: DC
Start: 2017-08-01 — End: 2017-08-01

## 2017-08-01 MED ORDER — MELATONIN ER 1 MG PO TBCR
1 MG | Freq: Every evening | ORAL | Status: DC
Start: 2017-08-01 — End: 2017-08-04

## 2017-08-01 MED ORDER — NORMAL SALINE FLUSH 0.9 % IV SOLN
0.9 % | INTRAVENOUS | Status: DC | PRN
Start: 2017-08-01 — End: 2017-08-04

## 2017-08-01 MED ORDER — TRAZODONE HCL 50 MG PO TABS
50 MG | Freq: Every evening | ORAL | Status: DC | PRN
Start: 2017-08-01 — End: 2017-08-04
  Administered 2017-08-03 (×2): 50 mg via ORAL

## 2017-08-01 MED ORDER — DEXTROSE 5 % IV SOLN (MINI-BAG)
5 % | Freq: Once | INTRAVENOUS | Status: AC
Start: 2017-08-01 — End: 2017-08-01
  Administered 2017-08-01: 19:00:00 1000 mg via INTRAVENOUS

## 2017-08-01 MED ORDER — SODIUM CHLORIDE 0.9 % IV SOLN
0.9 % | INTRAVENOUS | Status: DC
Start: 2017-08-01 — End: 2017-08-01

## 2017-08-01 MED ORDER — TRAMADOL HCL 50 MG PO TABS
50 MG | ORAL | Status: AC | PRN
Start: 2017-08-01 — End: 2017-08-04
  Administered 2017-08-02 – 2017-08-03 (×3): 50 mg via ORAL

## 2017-08-01 MED ORDER — MEROPENEM 1 G IV SOLR
1 g | Freq: Three times a day (TID) | INTRAVENOUS | Status: DC
Start: 2017-08-01 — End: 2017-08-01

## 2017-08-01 MED ORDER — SODIUM CHLORIDE 0.9% BOLUS (FLUID RESUSCITATION)
0.9 % | Freq: Once | INTRAVENOUS | Status: AC
Start: 2017-08-01 — End: 2017-08-01
  Administered 2017-08-01: 05:00:00 1659 mL/kg via INTRAVENOUS

## 2017-08-01 MED ORDER — SODIUM CHLORIDE 0.9 % IV BOLUS
0.9 % | Freq: Once | INTRAVENOUS | Status: AC
Start: 2017-08-01 — End: 2017-08-01
  Administered 2017-08-01: 20:00:00 1000 mL via INTRAVENOUS

## 2017-08-01 MED ORDER — IOPAMIDOL 76 % IV SOLN
76 % | Freq: Once | INTRAVENOUS | Status: AC | PRN
Start: 2017-08-01 — End: 2017-08-01
  Administered 2017-08-01: 17:00:00 75 mL via INTRAVENOUS

## 2017-08-01 MED ORDER — NORMAL SALINE FLUSH 0.9 % IV SOLN
0.9 % | Freq: Two times a day (BID) | INTRAVENOUS | Status: DC
Start: 2017-08-01 — End: 2017-08-01

## 2017-08-01 MED ORDER — GABAPENTIN 300 MG PO CAPS
300 MG | Freq: Two times a day (BID) | ORAL | Status: DC
Start: 2017-08-01 — End: 2017-08-01

## 2017-08-01 MED ORDER — TRAZODONE HCL 100 MG PO TABS
100 MG | Freq: Every evening | ORAL | Status: DC
Start: 2017-08-01 — End: 2017-08-01

## 2017-08-01 MED ORDER — NORMAL SALINE FLUSH 0.9 % IV SOLN
0.9 | Freq: Two times a day (BID) | INTRAVENOUS | Status: DC
Start: 2017-08-01 — End: 2017-08-01

## 2017-08-01 MED ORDER — DEXTROSE 5 % IV SOLN (MINI-BAG)
5 % | Freq: Three times a day (TID) | INTRAVENOUS | Status: DC
Start: 2017-08-01 — End: 2017-08-01

## 2017-08-01 MED ORDER — CLONIDINE HCL 0.1 MG PO TABS
0.1 MG | ORAL | Status: DC | PRN
Start: 2017-08-01 — End: 2017-08-01

## 2017-08-01 MED FILL — VANCOMYCIN HCL 1000 MG IV SOLR: 1000 MG | INTRAVENOUS | Qty: 1000

## 2017-08-01 MED FILL — SODIUM CHLORIDE 0.9 % IV SOLN: 0.9 % | INTRAVENOUS | Qty: 1000

## 2017-08-01 MED FILL — ACEPHEN 650 MG RE SUPP: 650 MG | RECTAL | Qty: 1

## 2017-08-01 MED FILL — MELATONIN TR 1 MG PO TBCR: 1 MG | ORAL | Qty: 2

## 2017-08-01 MED FILL — SODIUM CHLORIDE 0.9 % IV SOLN: 0.9 % | INTRAVENOUS | Qty: 2000

## 2017-08-01 MED FILL — PIPERACILLIN SOD-TAZOBACTAM SO 3.375 (3-0.375) G IV SOLR: 3.375 (3-0.375) g | INTRAVENOUS | Qty: 3.38

## 2017-08-01 MED FILL — POTASSIUM CHLORIDE ER 10 MEQ PO TBCR: 10 MEQ | ORAL | Qty: 4

## 2017-08-01 MED FILL — MAGNESIUM SULFATE 2 GM/50ML IV SOLN: 2 GM/50ML | INTRAVENOUS | Qty: 50

## 2017-08-01 NOTE — ED Notes (Signed)
Placed on bedpan, attempting to obtain urine specimen     Rondall Allegra, RN  08/01/17 [phone]

## 2017-08-01 NOTE — Consults (Signed)
Clinical Pharmacy Note  Vancomycin Consult    [name] [name] is a 36 y.o. female ordered Vancomycin for sepsis; consult received from Dr. Deatra Canter to manage therapy. Also receiving Merrem.    Patient Active Problem List   Diagnosis   . Wrist sprain   . Radial head fracture   . SIRS (systemic inflammatory response syndrome) (HCC)   . Hypotension   . Polysubstance (excluding opioids) dependence ([place])   . Sepsis ([place])       Allergies:  Latex; Latex; Cephalexin; and Flagyl [metronidazole]     Temp max:  Temp (24hrs), Avg:99.9 F (37.7 C), Min:97.9 F (36.6 C), Max:103.5 F (39.7 C)      Recent Labs      08/01/17   0020  08/01/17   1320   WBC  4.2  12.4*       Recent Labs      08/01/17   0020  08/01/17   1320   BUN  12  12   CREATININE  0.7  <0.5*       No intake or output data in the 24 hours ending 08/01/17 1719    Culture Results:  pending    Ht Readings from Last 1 Encounters:   08/01/17 5\' 5"  (1.651 m)        Wt Readings from Last 1 Encounters:   08/01/17 131 lb 3.2 oz (59.5 kg)         CrCl cannot be calculated (This lab value cannot be used to calculate CrCl because it is not a number: <0.5).    Assessment/Plan:  Will initiate vancomycin 750 mg IV every 12 hours (1000mg  loading dose given in ER).  Regimen projects a trough level of 15-20 mg/L.  Timing of trough level will be determined based on culture results, renal function, and clinical response.    Thank you for the consult.  Will continue to follow.

## 2017-08-01 NOTE — ED Notes (Signed)
Dr. Lorelee Cover returned call and spoke with Dr. Gypsy Balsam.  Dr. Gypsy Balsam updated Dr. Lottie Dawson regarding patient blood pressure.  No new orders received.     Charmaine Downs, RN  08/01/17 [phone]

## 2017-08-01 NOTE — ED Triage Notes (Signed)
Pt was seen here sometime in the last 24 hours unclear on the timeline. Pt is resting in bed somewhat lethargic. Pt states that she has an "infectious disease" but did not know much about her history. Pt VS are as noted.

## 2017-08-01 NOTE — Discharge Summary (Signed)
Patient was admitted on 08/01/17 with fever and hypotension . Patient  AMA on 08/01/17.    Patient has normal mental status and adequate capacity to make medical decisions.    The patient refuses hospital admission and wants to be discharged.    The risks have been explained to the patient, including worsening illness, chronic pain, permanent disability, and death.    The benefits of admission have also been explained, including the availability and proximity of nurses, physicians, monitoring, diagnostic testing, and treatment.    The patient was able to understand and state the risks and benefits of hospital admission.  This was witnessed by the nurse.    Patient had the opportunity to ask questions about his medical condition.    The patient was treated to the extent that he would allow, and knows that he may return for care at any time.    Instructed patient to follow up with PCP.Marland Kitchen    Electronically signed by Marcelle Smiling, MD

## 2017-08-01 NOTE — ED Notes (Signed)
Spoke with "Raquel Sarna" at patient access center to page hospitalist     Rondall Allegra, RN  08/01/17 [phone]

## 2017-08-01 NOTE — ED Notes (Signed)
Sleeping, audible snoring, easily awakened, alert.      Rondall Allegra, RN  08/01/17 [phone]

## 2017-08-01 NOTE — Progress Notes (Signed)
Sleeping respirations easy.
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
## 2017-08-01 NOTE — ED Notes (Signed)
Verbal order obtained from Dr. Gypsy Balsam for Saline bolus.  Patient awake asking for something to eat.  Cookies and drink provided.     Charmaine Downs, RN  08/01/17 [phone]

## 2017-08-01 NOTE — ED Notes (Signed)
PTS transport here.  Report given to transport team.     Charmaine Downs, RN  08/01/17 [phone]

## 2017-08-01 NOTE — Progress Notes (Signed)
Pt received from ED per stretcher denies pain except headache wants to eat.

## 2017-08-01 NOTE — ED Notes (Signed)
Straight cath with female cath kit after patient unable to urinate using bedpan. Tolerated procedure well.      Rondall Allegra, RN  08/01/17 [phone]

## 2017-08-01 NOTE — Progress Notes (Signed)
Result reviewed no further action necessary

## 2017-08-01 NOTE — Progress Notes (Signed)
Pt sleeping, open eyes when spoken to. Pt denies pain, call light within reach. Will cont to monitor

## 2017-08-01 NOTE — Consults (Signed)
Clinical Pharmacy Note  Vancomycin Consult    [name] [name] is a 36 y.o. female ordered Vancomycin for septicemia; consult received from Dr. Adelene Idler to manage therapy. Also receiving zosyn.    Patient Active Problem List   Diagnosis   . Wrist sprain   . Radial head fracture   . Sepsis ([place])       Allergies:  Latex; Latex; Cephalexin; and Flagyl [metronidazole]     Temp max:  Temp (24hrs), Avg:100.4 F (38 C), Min:98.1 F (36.7 C), Max:103.5 F (39.7 C)      Recent Labs      08/01/17   0020   WBC  4.2       Recent Labs      08/01/17   0020   BUN  12   CREATININE  0.7       No intake or output data in the 24 hours ending 08/01/17 1032    Culture Results:       Ht Readings from Last 1 Encounters:   08/01/17 5\' 5"  (1.651 m)        Wt Readings from Last 1 Encounters:   08/01/17 134 lb 4.2 oz (60.9 kg)         Estimated Creatinine Clearance: 100 mL/min (based on SCr of 0.7 mg/dL).    Assessment/Plan:  Vancomycin 1000 mg given in ED.  Will initiate vancomycin 750 mg IV every 12 hours.  Regimen projects a trough level of 15-20 mg/L.  Timing of trough level will be determined based on culture results, renal function, and clinical response.    Thank you for the consult.  Will continue to follow.  Corky Mull, [facility]

## 2017-08-01 NOTE — ED Notes (Signed)
Report called to RN Estill Bamberg on [employer] [place]  08/01/17 [phone]

## 2017-08-01 NOTE — Plan of Care (Signed)
Problem: Pain:  Goal: Pain level will decrease  Pain level will decrease   Outcome: Ongoing  Pain/discomfort being managed with PRN analgesics per MD orders. Pt able to express presence and absence of pain and rate pain appropriately using numerical scale.        Problem: Falls - Risk of:  Goal: Will remain free from falls  Will remain free from falls   Outcome: Ongoing  Fall risk assessment completed every shift. All precautions in place. Pt has call light within reach at all times. Room clear of clutter. Pt aware to call for assistance when getting up.

## 2017-08-01 NOTE — ED Notes (Signed)
Pt to be admitted in room 5107  Report to be called at [place], RN  08/01/17 [phone]
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
## 2017-08-01 NOTE — ED Notes (Signed)
[place]     Sinclair Ship, RN  08/01/17 [phone]

## 2017-08-01 NOTE — ED Notes (Signed)
Spoke with Janett Billow at access center to ask that hospitalist be paged regarding patients  low blood pressure.     Charmaine Downs, RN  08/01/17 [phone]

## 2017-08-01 NOTE — Progress Notes (Signed)
Medication Reconciliation    List of medications patient is currently taking is complete.    Christena Flake, PharmD, BCPS  08/01/2017 3:29 PM

## 2017-08-01 NOTE — ED Provider Notes (Signed)
Triage Chief Complaint:   Fever (103.5, presents to ER, slurred speech, difficult to understand, )    HOPI:  [name] [name] is a 36 y.o. female that presents with altered mental status and fever.  She was transported by EMS from home.  Family called.  He has been altered today.  She does have a history of substance abuse.  She has used heroin but she denies using it for the past week.  Our screening vitals revealed temperature of 103.  She denies headache or stiff neck.  She denies chest pain.  She has no shortness of breath or cough.  No nausea or vomiting.  She seems inappropriate to me as she denies being ill.    ROS:  Review of systems not accurate due to illness and altered mental status    Past Medical History:   Diagnosis Date   . Anxiety    . Bipolar affective ([place])    . Bipolar affective disorder ([place])    . Cervical fusion syndrome    . Chronic back pain greater than 3 months duration    . Chronic pain    . Depression    . Gestational diabetes mellitus    . Gestational diabetes mellitus in childbirth     with pregnancy   . 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 Miscarriage    . OCD (obsessive compulsive disorder)    . Paranoia [facility])      Past Surgical History:   Procedure Laterality Date   . CERVICAL DISC SURGERY     . CERVICAL SPINE SURGERY      C1-C2 fusion 2003   . CESAREAN SECTION     . ECTOPIC PREGNANCY SURGERY       Family History   Problem Relation Age of Onset   . Arthritis Other    . Asthma Other    . Cancer Other    . Diabetes Other    . Kidney Disease Other      Social History     Social History   . Marital status: Divorced     Spouse name: N/A   . Number of children: 1   . Years of education: N/A     Occupational History   . Not on file.     Social History Main Topics   . Smoking status: Current Every Day Smoker     Packs/day: 0.50     Years: 11.00     Types: Cigarettes   . Smokeless tobacco: Never Used   . Alcohol use Yes      Comment: OCC every 2-3 weeks   . Drug use: Yes     Types:  Cocaine, Opiates       Comment: states in tx. for heroin   . Sexual activity: Not on file     Other Topics Concern   . Not on file     Social History Narrative    ** Merged History Encounter **          Current Facility-Administered Medications   Medication Dose Route Frequency Provider Last Rate Last Dose   . acetaminophen (TYLENOL) suppository 650 mg  650 mg Rectal Q4H PRN Otis Brace, MD   650 mg at 08/01/17 0104     Current Outpatient Prescriptions   Medication Sig Dispense Refill   . diclofenac (VOLTAREN) 75 MG EC tablet Take 1 tablet by mouth 2 times daily as needed for Pain 14 tablet 0   .  cyclobenzaprine (FLEXERIL) 10 MG tablet Take 1 tablet by mouth 3 times daily as needed for Muscle spasms 21 tablet 0   . Gabapentin (NEURONTIN PO) Take by mouth     . citalopram (CELEXA) 20 MG tablet Take 40 mg by mouth daily.     . traZODone (DESYREL) 150 MG tablet Take 150 mg by mouth nightly.       Allergies   Allergen Reactions   . Latex Itching, Rash and Other (See Comments)     Burning to the touch    . Latex Rash   . Cephalexin Shortness Of Breath   . Flagyl [Metronidazole] Shortness Of Breath       Nursing Notes Reviewed    Physical Exam:  ED Triage Vitals   Enc Vitals Group      BP       Pulse       Resp       Temp       Temp src       SpO2       Weight       Height       Head Circumference       Peak Flow       Pain Score       Pain Loc       Pain Edu?       Excl. in [place]?      GENERAL APPEARANCE: Awake, sluggish to answer questions, not oriented. Cooperative. No acute distress.  She does however appear acutely ill.  HEAD: Normocephalic. Atraumatic.  EYES: EOM's grossly intact. Sclera anicteric. PEERL  ENT: Mucous membranes are moist. Tolerates saliva. No trismus.  NECK: Supple. No meningismus. Trachea midline.  No JVD.  No bruits.  HEART: Heart rate rapid. Radial pulses 2+.  Heart without murmur.  LUNGS: Respirations unlabored. CTAB  ABDOMEN: Soft. Non-tender. No guarding or rebound.  No CVAT.  EXTREMITIES: No  acute deformities.  Hand grips are equal.  No joint swelling.  No lower extremity calf pain or edema.  SKIN: Warm and dry.  NEUROLOGICAL:  GCS 14.  Cranial nerves intact.  DTRs are equal.  Moves all 4 extremities spontaneously.  No focal motor or sensory abnormalities of the upper or lower extremities.  PSYCHIATRIC: Dull mood.    I have reviewed and interpreted all of the currently available lab results from this visit (if applicable):  Results for orders placed or performed during the hospital encounter of 08/01/17   CBC auto differential   Result Value Ref Range    WBC 4.2 4.0 - 11.0 K/uL    RBC 4.30 4.00 - 5.20 M/uL    Hemoglobin 12.7 12.0 - 16.0 g/dL    Hematocrit 37.0 36.0 - 48.0 %    MCV 86.1 80.0 - 100.0 fL    MCH 29.4 26.0 - 34.0 pg    MCHC 34.2 31.0 - 36.0 g/dL    RDW 13.2 12.4 - 15.4 %    Platelets 143 135 - 450 K/uL    MPV 8.0 5.0 - 10.5 fL    Neutrophils % 90.7 %    Lymphocytes % 8.4 %    Monocytes % 0.5 %    Eosinophils % 0.2 %    Basophils % 0.2 %    Neutrophils # 3.8 1.7 - 7.7 K/uL    Lymphocytes # 0.4 (L) 1.0 - 5.1 K/uL    Monocytes # 0.0 0.0 - 1.3 K/uL    Eosinophils # 0.0 0.0 -  0.6 K/uL    Basophils # 0.0 0.0 - 0.2 K/uL   Comprehensive Metabolic Panel w/ Reflex to MG   Result Value Ref Range    Sodium 137 136 - 145 mmol/L    Potassium reflex Magnesium 3.2 (L) 3.5 - 5.1 mmol/L    Chloride 97 (L) 99 - 110 mmol/L    CO2 26 21 - 32 mmol/L    Anion Gap 14 3 - 16    Glucose 134 (H) 70 - 99 mg/dL    BUN 12 7 - 20 mg/dL    CREATININE 0.7 0.6 - 1.1 mg/dL    GFR Non-African American >60 >60    GFR African American >60 >60    Calcium 9.2 8.3 - 10.6 mg/dL    Total Protein 6.9 6.4 - 8.2 g/dL    Alb 4.2 3.4 - 5.0 g/dL    Albumin/Globulin Ratio 1.6 1.1 - 2.2    Total Bilirubin 1.0 0.0 - 1.0 mg/dL    Alkaline Phosphatase 152 (H) 40 - 129 U/L    ALT 87 (H) 10 - 40 U/L    AST 195 (H) 15 - 37 U/L    Globulin 2.7 g/dL   Urinalysis,reflex to microscopic (Lab)(UA)   Result Value Ref Range    Color, UA Yellow  Straw/Yellow    Clarity, UA Clear Clear    Glucose, Ur Negative Negative mg/dL    Bilirubin Urine SMALL (A) Negative    Ketones, Urine TRACE (A) Negative mg/dL    Specific Gravity, UA 1.025 1.005 - 1.030    Blood, Urine Negative Negative    pH, UA 6.0 5.0 - 8.0    Protein, UA TRACE (A) Negative mg/dL    Urobilinogen, Urine 1.0 <2.0 E.U./dL    Nitrite, Urine Negative Negative    Leukocyte Esterase, Urine Negative Negative    Microscopic Examination YES     Urine Type Not Specified    Lactic acid, plasma   Result Value Ref Range    Lactic Acid 2.3 (H) 0.4 - 2.0 mmol/L   Urine Drug Screen   Result Value Ref Range    Amphetamine Screen, Urine POSITIVE (A) Negative <1000ng/mL    Barbiturate Screen, Ur Neg Negative <200 ng/mL    Benzodiazepine Screen, Urine POSITIVE (A) Negative <200 ng/mL    Cannabinoid Scrn, Ur POSITIVE (A) Negative <50 ng/mL    Cocaine Metabolite Screen, Urine POSITIVE (A) Negative <300 ng/mL    Opiate Scrn, Ur POSITIVE (A) Negative <300 ng/mL    PCP Screen, Urine Neg Negative <25 ng/mL    Methadone Screen, Urine Neg Negative <300 ng/mL    Propoxyphene Scrn, Ur Neg Negative <300 ng/mL    pH, UA 6.0     Drug Screen Comment: see below     Oxycodone Urine Neg Negative <100 ng/ml   Ethanol   Result Value Ref Range    Ethanol Lvl None Detected mg/dL   HCG Qualitative, Serum   Result Value Ref Range    hCG Qual Negative Detects HCG level >10 MIU/mL   Magnesium   Result Value Ref Range    Magnesium 1.30 (L) 1.80 - 2.40 mg/dL   Lactic Acid, Plasma   Result Value Ref Range    Lactic Acid 1.0 0.4 - 2.0 mmol/L   Microscopic Urinalysis   Result Value Ref Range    WBC, UA 0-2 0 - 5 /HPF    RBC, UA 0-2 0 - 2 /HPF    Epi Cells 0-2 /HPF  Radiographs (if obtained):  []  The following radiograph was interpreted by myself in the absence of a radiologist:  [x]  Radiologist's Report Reviewed:  Radiologist reports the chest x-ray is clear.    EKG (if obtained): (All EKG's are interpreted by myself in the absence of a  cardiologist)    MDM:  She is placed in a room and examined.  Blood pressure is normal however she is to Neck, tachycardic and febrile with a temperature of 103.5.  She appears acutely ill.  Sepsis orders are initiated.  She's given 30 mL/kg normal saline.  She is not hypotensive at any time during the ED treatment.  After blood and urine cultures are obtained she is given vancomycin 1 g IV and Zosyn 3.375 g IV.  She is given Tylenol for fever.  CBC shows a concerningly low WBC count.  Magnesium is 1.3.  Potassium 3.2.  BUN and creatinine are normal.  Lactic acid is 2.3.  Alcohol was negative.  Beta hCG is negative.  She is given magnesium, 2 g IV.  She is given potassium orally.  Over time her heart rate comes down to 111.  Temperature comes down to 100.9.  Her sensorium improves and she is able to converse with the staff.  She continues to deny that she is ill.  Urine tox screen is positive for multiple substances including amphetamines, benzodiazepine, opiates, marijuana and cocaine.  After 2 hours repeat lactic acid is come down to 1.0.  I've called discussed case with the hospitalist at [facility] be transferred and admitted.  She is in critical condition but stable to be transported.Due to the immediate potential for life-threatening deterioration due to altered mental status, sepsis and polysubstance abuse , I spent 30 minutes providing critical care.  This time is excluding time spent performing procedures.      Clinical Impression:  1. Altered mental status, unspecified altered mental status type    2. Septicemia ([place])    3. Low magnesium level    4. Hypokalemia    5. Polysubstance abuse ([place])      (Please note that portions of this note may have been completed with a voice recognition program. Efforts were made to edit the dictations but occasionally words are mis-transcribed.)    Renato Gails, MD         Otis Brace, MD  08/01/17 [id number]

## 2017-08-01 NOTE — ED Notes (Signed)
Patient aware of need for urine specimen, verb under, patient request ice chips     Rondall Allegra, RN  08/01/17 0111

## 2017-08-01 NOTE — ED Provider Notes (Addendum)
[place]  eMERGENCY dEPARTMENT eNCOUnter      Pt Name: [name] [name]  MRN: [id number]  [name] [name] 1980-12-22  Date of evaluation: 08/01/2017  Provider: Boykin Peek, DO    CHIEF COMPLAINT       Chief Complaint   Patient presents with   . Nausea     pt ambulatory to ER for c/o nausea, reports that she just signed out AMA from inpatient unit for "infection"         HISTORY OF PRESENT ILLNESS   (Location/Symptom, Timing/Onset, Context/Setting, Quality, Duration, Modifying Factors, Severity)  Note limiting factors.   [name] [name] is a 36 y.o. female who presents to the emergency department For complaint of nausea and fatigue.  Patient was seen in the ER yesterday for sepsis.  She had a temperature of 103 and was hypotensive which responded with IV fluids.  Patient does have a history of polysubstance abuse and IV drug use.  She is admitted yesterday and given IV antibiotics and they're waiting for blood cultures result.  His cultures are still not back at this time.  States that she began having an anxiety attack today and needed to go outside.  She signed out against medical advice and walk to the gas station.  She came back 30 minutes later stating that her nausea had returned and she was very fatigued.  Denies any substance abuse when she left.  Nursing Notes were reviewed.      PAST MEDICAL HISTORY     Past Medical History:   Diagnosis Date   . Anxiety    . Bipolar affective ([place])    . Bipolar affective disorder ([place])    . Cervical fusion syndrome    . Chronic back pain greater than 3 months duration    . Chronic pain    . Depression    . Gestational diabetes mellitus    . Gestational diabetes mellitus in childbirth     with pregnancy   . 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 Miscarriage    . OCD (obsessive compulsive disorder)    . Paranoia ([place])          SURGICAL HISTORY       Past Surgical History:   Procedure Laterality Date   . CERVICAL DISC SURGERY     . CERVICAL SPINE SURGERY      C1-C2  fusion 2003   . CESAREAN SECTION     . ECTOPIC PREGNANCY SURGERY           CURRENT MEDICATIONS       Current Discharge Medication List      CONTINUE these medications which have NOT CHANGED    Details   buprenorphine-naloxone (SUBOXONE) 8-2 MG FILM SL film Place 2 Film under the tongue daily..      cloNIDine (CATAPRES) 0.1 MG tablet Take 0.1 mg by mouth every 12 hours as needed for High Blood Pressure (restlessness / withdrawal)      hydrOXYzine (ATARAX) 25 MG tablet Take 25 mg by mouth 4 times daily as needed for Itching or Anxiety      citalopram (CELEXA) 20 MG tablet Take 20 mg by mouth daily              ALLERGIES     Latex; Latex; Cephalexin; and Flagyl [metronidazole]    FAMILY HISTORY       Family History   Problem Relation Age of Onset   . Arthritis Other    .  Asthma Other    . Cancer Other    . Diabetes Other    . Kidney Disease Other           SOCIAL HISTORY       Social History     Social History   . Marital status: Divorced     Spouse name: N/A   . Number of children: 1   . Years of education: N/A     Social History Main Topics   . Smoking status: Current Every Day Smoker     Packs/day: 0.50     Years: 11.00     Types: Cigarettes   . Smokeless tobacco: Never Used   . Alcohol use Yes      Comment: OCC every 2-3 weeks   . Drug use: Yes     Types: Cocaine, Opiates       Comment: states in tx. for heroin   . Sexual activity: Not Asked     Other Topics Concern   . None     Social History Narrative    ** Merged History Encounter **            SCREENINGS    Glasgow Coma Scale  Eye Opening: To speech  Best Verbal Response: Oriented  Best Motor Response: Obeys commands  Glasgow Coma Scale Score: 14          Review of Systems  Constitutional:  Denies fever, chills  Eyes: denies eye problems  HEENT: denies sore throat or ear pain  Respiratory: denies cough or shortness of breath  Cardiovascular: denies chest pain, palpitations  GI: Reports nausea  Musculoskeletal: denies joint pain  Skin: denies rash  Neurologic:  denies headache, focal weakness or sensory changes  Lymphatic: denies swollen glands    Except as noted above the remainder of the review of systems was reviewed and negative.       PHYSICAL EXAM    (up to 7 for level 4, 8 or more for level 5)     ED Triage Vitals [08/01/17 1143]   BP Temp Temp Source Pulse Resp SpO2 Height Weight   104/61 98.1 F (36.7 C) Oral 81 14 95 % -- 128 lb 12 oz (58.4 kg)       Constitutional: Fatigue, no acute distress  HEENT: normocephalic, atraumatic, oropharynx moist, no oral exudates, nares patent  Neck: normal range of motion, no tenderness, trachea midline, no stridor  Eyes: PERRLA, EOMI, conjunctiva normal  Respiratory: normal breath sounds, non labored breathing pattern  Cardiovascular: normal heart rate, normal rhythm  GI: bowel sounds normal, soft, nontender, nondistended, no pulsatile masses  GU: deferred  Musculoskeletal: intact distal pulses, no clubbing, cyanosis, or edema.  Good range of motion  Back: no tenderness  Integument: warm, dry, no erythema, no rash, < 2 second cap refill  Lymphatic: no lymphadenopathy noted  Neurologic: alert and oriented 3, no focal deficits appreciated    DIAGNOSTIC RESULTS     EKG:     All EKG's are interpreted by the Emergency Department Physician who either signs or Co-signs this chart in the absence of a cardiologist.      RADIOLOGY:   Interpretation per the Radiologist below, if available at the time of this note:    XR CHEST PORTABLE   Final Result   No acute process.         CT ABDOMEN PELVIS W IV CONTRAST Additional Contrast? None   Final Result   1. Periportal edema.  This  can be an incidental finding, but recommend   correlation with any clinical findings of hepatocellular disease   2. No gastrointestinal abnormality demonstrated   3. Small amount of free intraperitoneal fluid, and nonspecific omental edema               LABS:  Labs Reviewed   CBC - Abnormal; Notable for the following:        Result Value    WBC 12.4 (*)     RBC 3.79  (*)     Hemoglobin 10.8 (*)     Hematocrit 33.1 (*)     All other components within normal limits    Narrative:     Performed at:  [facility]  [address] [place] [address]   Phone [phone]   LACTATE, SEPSIS - Abnormal; Notable for the following:     Lactic Acid, Sepsis 2.4 (*)     All other components within normal limits    Narrative:     Performed at:  [facility]  [address] [place] [address]   Phone [phone]   COMPREHENSIVE METABOLIC PANEL - Abnormal; Notable for the following:     Sodium 134 (*)     Glucose 106 (*)     CREATININE <0.5 (*)     Calcium 8.0 (*)     Total Protein 5.6 (*)     Alb 3.1 (*)     ALT 81 (*)     AST 108 (*)     All other components within normal limits    Narrative:     Performed at:  [facility]  [address] [place] [address]   Phone [phone]   CULTURE BLOOD #1   CULTURE BLOOD #2   LACTATE, SEPSIS    Narrative:     Performed at:  [facility]  [address] [place] [address]   Phone [phone]   MAGNESIUM    Narrative:     Performed at:  [facility]  [address] [place] [address]   Phone [phone]   TROPONIN    Narrative:     Performed at:  [facility]  [address] [place] [address]   Phone [phone]   LIPASE    Narrative:     Performed at:  [facility]  [address] [place] [address]   Phone [phone]   ETHANOL    Narrative:     Performed at:  [facility]  [address] [place] [address]   Phone [phone]   URINALYSIS    Narrative:     Performed at:  [facility]  [address] [place] [address]   Phone [phone]   LACTATE, SEPSIS    Narrative:      Performed at:  [facility]  [address] [place] [address]   Phone [phone]   LACTATE, SEPSIS       All other labs were within normal range or not returned as of this dictation.    EMERGENCY DEPARTMENT COURSE and DIFFERENTIAL DIAGNOSIS/MDM:   Vitals:  Vitals:    08/01/17 1531 08/01/17 1546 08/01/17 1629 08/01/17 2027   BP: 98/62 100/62 101/73 110/70   Pulse: 80 77 81 90   Resp:   20 16   Temp:   97.9 F (36.6 C) 98.1 F (36.7 C)   TempSrc:   Oral Oral   SpO2: 100% 100% 100% 99%   Weight:   131 lb 3.2 oz (59.5 kg)    Height:   5\' 5"  (1.651 m)          MDM  36 year old female presents to the ER complaining of nausea and fatigue.  She left 30 minutes ago and was admitted for sepsis and they're following blood cultures.  Her vital signs are stable here.  Physical exam as above.  We'll get septic workup and scan abdomen and pelvis as this was not performed yesterday.    Lab work shows elevated lactic acid and elevated white count.  2 L IV fluids given.  CT scan of the abdomen and pelvis shows some peri-portal edema and fluid.  Vancomycin and Zosyn given and patient admitted with concern for bacteremia.    ED crit    CRITICAL CARE:  The high probability of sudden, clinically significant deterioration in the patient's condition required the highest level of my preparedness to intervene urgently.    The services I provided to this patient were to treat and/or prevent clinically significant deterioration that could result in: circulatory collapse.  Services included the following: chart data review, reviewing nursing notes and/or old charts, documentation time, consultant collaboration regarding findings and treatment options, medication orders and management, direct patient care, re-evaluations, vital sign assessments and ordering, interpreting and reviewing diagnostic studies/lab tests.    Aggregate critical care time was 25 minutes, which includes only time during  which I was engaged in work directly related to the patient's care, as described above, whether at the bedside or elsewhere in the Emergency Department.  It did not include time spent performing other reported procedures or the services of residents, students, nurses or physician assistants.        CONSULTS:  IP CONSULT TO INFECTIOUS DISEASES  PHARMACY TO DOSE VANCOMYCIN  CHEMICAL DEPENDENCY REFERRAL FOR SOCIAL SERVICE CONSULT      FINAL IMPRESSION      1. Septicemia ([place])    2. Nausea    3. Lactic acidosis          DISPOSITION/PLAN   DISPOSITION Admitted 08/01/2017 02:44:00 PM      PATIENT REFERRED TO:  [place] [facility]            DISCHARGE MEDICATIONS:  Current Discharge Medication List             (Please note that portions of this note were completed with a voice recognition program.  Efforts were made to edit the dictations but occasionally words are mis-transcribed.)    Boykin Peek, DO (electronically signed)  Attending Emergency Physician      Boykin Peek, DO  08/01/17 [place] Short, DO  08/17/17 6440

## 2017-08-01 NOTE — ED Notes (Signed)
hospitalist spoke with patient and accepted patient     [name] Allegra, RN  08/01/17 [phone]

## 2017-08-01 NOTE — ED Notes (Signed)
Patient sleeping.  Arousable to name and shaking.  Reoriented to place.  Thinks she is at [employer].  Patient falls asleep when talked to.  Made aware of transfer to [place], RN  08/01/17 [phone]

## 2017-08-02 LAB — CBC WITH AUTO DIFFERENTIAL
Basophils %: 0.5 %
Basophils Absolute: 0 10*3/uL (ref 0.0–0.2)
Eosinophils %: 0.4 %
Eosinophils Absolute: 0 10*3/uL (ref 0.0–0.6)
Hematocrit: 33.9 % — ABNORMAL LOW (ref 36.0–48.0)
Hemoglobin: 11.3 g/dL — ABNORMAL LOW (ref 12.0–16.0)
Lymphocytes %: 15.6 %
Lymphocytes Absolute: 1.4 10*3/uL (ref 1.0–5.1)
MCH: 29.3 pg (ref 26.0–34.0)
MCHC: 33.4 g/dL (ref 31.0–36.0)
MCV: 88 fL (ref 80.0–100.0)
MPV: 8.6 fL (ref 5.0–10.5)
Monocytes %: 4.2 %
Monocytes Absolute: 0.4 10*3/uL (ref 0.0–1.3)
Neutrophils %: 79.3 %
Neutrophils Absolute: 7.2 10*3/uL (ref 1.7–7.7)
Platelets: 133 10*3/uL — ABNORMAL LOW (ref 135–450)
RBC: 3.86 M/uL — ABNORMAL LOW (ref 4.00–5.20)
RDW: 14 % (ref 12.4–15.4)
WBC: 9 10*3/uL (ref 4.0–11.0)

## 2017-08-02 LAB — COMPREHENSIVE METABOLIC PANEL
ALT: 66 U/L — ABNORMAL HIGH (ref 10–40)
AST: 59 U/L — ABNORMAL HIGH (ref 15–37)
Albumin/Globulin Ratio: 1.2 (ref 1.1–2.2)
Albumin: 3 g/dL — ABNORMAL LOW (ref 3.4–5.0)
Alkaline Phosphatase: 98 U/L (ref 40–129)
Anion Gap: 10 (ref 3–16)
BUN: 6 mg/dL — ABNORMAL LOW (ref 7–20)
CO2: 20 mmol/L — ABNORMAL LOW (ref 21–32)
Calcium: 8.2 mg/dL — ABNORMAL LOW (ref 8.3–10.6)
Chloride: 112 mmol/L — ABNORMAL HIGH (ref 99–110)
Creatinine: <0.5 mg/dL — ABNORMAL LOW (ref 0.6–1.1)
GFR African American: >60 (ref >60)
GFR Non-African American: >60 (ref >60)
Globulin: 2.5 g/dL
Glucose: 99 mg/dL (ref 70–99)
Potassium: 3.9 mmol/L (ref 3.5–5.1)
Sodium: 142 mmol/L (ref 136–145)
Total Bilirubin: 0.3 mg/dL (ref 0.0–1.0)
Total Protein: 5.5 g/dL — ABNORMAL LOW (ref 6.4–8.2)

## 2017-08-02 LAB — LACTATE, SEPSIS
Lactic Acid, Sepsis: 1 mmol/L (ref 0.4–1.9)
Lactic Acid, Sepsis: 1.5 mmol/L (ref 0.4–1.9)

## 2017-08-02 LAB — HEPATITIS PANEL, ACUTE
Hep A IgM: NONREACTIVE
Hep B Core Ab, IgM: NONREACTIVE
Hep B S Ag Interp: NONREACTIVE
Hep C Ab Interp: REACTIVE — AB

## 2017-08-02 MED ORDER — CITALOPRAM HYDROBROMIDE 20 MG PO TABS
20 MG | Freq: Every day | ORAL | Status: DC
Start: 2017-08-02 — End: 2017-08-02

## 2017-08-02 MED ORDER — SODIUM CHLORIDE 0.9 % IJ SOLN
0.9 % | Freq: Two times a day (BID) | INTRAMUSCULAR | Status: DC
Start: 2017-08-02 — End: 2017-08-04
  Administered 2017-08-03 – 2017-08-04 (×4): 10 mL via INTRAVENOUS

## 2017-08-02 MED ORDER — DEXTROSE 5 % IV SOLN (MINI-BAG)
5 % | Freq: Three times a day (TID) | INTRAVENOUS | Status: DC
Start: 2017-08-02 — End: 2017-08-02

## 2017-08-02 MED ORDER — ONDANSETRON HCL 4 MG/2ML IJ SOLN
4 MG/2ML | Freq: Four times a day (QID) | INTRAMUSCULAR | Status: DC | PRN
Start: 2017-08-02 — End: 2017-08-04
  Administered 2017-08-02 – 2017-08-03 (×3): 4 mg via INTRAVENOUS

## 2017-08-02 MED ORDER — KETOROLAC TROMETHAMINE 15 MG/ML IJ SOLN
15 MG/ML | Freq: Four times a day (QID) | INTRAMUSCULAR | Status: DC | PRN
Start: 2017-08-02 — End: 2017-08-04
  Administered 2017-08-02 – 2017-08-04 (×4): 15 mg via INTRAVENOUS

## 2017-08-02 MED ORDER — PANTOPRAZOLE SODIUM 40 MG IV SOLR
40 MG | Freq: Two times a day (BID) | INTRAVENOUS | Status: DC
Start: 2017-08-02 — End: 2017-08-04
  Administered 2017-08-03 – 2017-08-04 (×4): 40 mg via INTRAVENOUS

## 2017-08-02 MED FILL — PIPERACILLIN SOD-TAZOBACTAM SO 3.375 (3-0.375) G IV SOLR: 3.375 (3-0.375) g | INTRAVENOUS | Qty: 3.38

## 2017-08-02 MED FILL — CLONIDINE HCL 0.1 MG PO TABS: 0.1 MG | ORAL | Qty: 1

## 2017-08-02 MED FILL — SODIUM CHLORIDE 0.9 % IV SOLN: 0.9 % | INTRAVENOUS | Qty: 1000

## 2017-08-02 MED FILL — KETOROLAC TROMETHAMINE 15 MG/ML IJ SOLN: 15 MG/ML | INTRAMUSCULAR | Qty: 1

## 2017-08-02 MED FILL — ONDANSETRON HCL 4 MG/2ML IJ SOLN: 4 MG/2ML | INTRAMUSCULAR | Qty: 2

## 2017-08-02 MED FILL — MELATONIN TR 1 MG PO TBCR: 1 MG | ORAL | Qty: 2

## 2017-08-02 MED FILL — TRAMADOL HCL 50 MG PO TABS: 50 MG | ORAL | Qty: 1

## 2017-08-02 MED FILL — LOVENOX 40 MG/0.4ML SC SOLN: 40 MG/0.4ML | SUBCUTANEOUS | Qty: 0.4

## 2017-08-02 MED FILL — DICYCLOMINE HCL 20 MG PO TABS: 20 MG | ORAL | Qty: 1

## 2017-08-02 NOTE — Progress Notes (Signed)
Patient A&O. Patient resting in bed at this time. Call light within reach.  Will continue to monitor and reassess. Electronically signed by Margaret Pyle, RN on 08/02/2017

## 2017-08-02 NOTE — Progress Notes (Signed)
Hospital Medicine Progress Note      Admit Date: 08/01/2017         Overnight Events: none    CC: F/U for abdominal pain, fever    HPI: The patient is a 36 y.o. black female with a medical history that includes polysubstance abuse including heroin for which she has used IV in the distant past and bipolar affective disorder who returned to [facility]  after being admitted with fever of unknown origin noted to be 103.5 F and altered mental status for which she left AMA. Patient had admitted that she just recently has relapsed over the weekend and has been snorting heroin.  Patient has been in active outpatient rehab at [facility] and had been on outpatient Suboxone therapy prior to her relapse.  She reported epigastric and RUQ pain that is sharp in nature radiating into her shoulders associated with nausea.  She also reported some vaginal discharge and was recently seen at the [facility] clinic.  Initial blood cultures are negative. CXR did not demonstrate any acute process.  UA was negative.  CT of the abdomen and pelvis demonstrated periportal edema and a small amount of free intraperitoneal fluid and omental fluid.  LFTs are elevated. Initial lactic acid was 2.3 in the setting of initital fever and tachycardia, thereby meeting sepsis criteria.  Infectious disease was consulted.  She received IV vancomycin and zosyn in the ER.  Antibiotics on hold per ID.  UDS was positive for for amphetamine, benzos, THC, opiates, and cocaine.        Interval History/Subjective: continues to C/O severe abdominal pain and states she is nauseated and unable to eat.  States she does not know if she has hep C.  States she took Suboxone today.        Review of Systems:     Comprehensive ROS negative except as mentioned below.    General ROS:  []   fevers  []   chills  []   fatigue  []   weakness  []   night sweats  []   body aches []  Other:  HEENT ROS:  []   trauma  []   headache  []   visual changes  []   double vision  []   blurred vision   []   tinnitus  []   vertigo  []   ear ache  []   drainage  []   bleeding gums  []   hoarseness  []   voice change  []   difficult/painful swallowing  []   stuffiness  []   rhinorrhea  []   sneezing  []   epistaxis []  Other:  Respiratory ROS:  []   cough  []   SOB  []   wheezing  []   changes in sputum production or quality  []   hemoptysis  []   pleurisy  []   snoring []  Other:  Cardiovascular ROS:  []  palpitations  []   pain  []   DOE  []   orthopnea  []   syncope  []   lower extremity edema []  Other:  Gastrointestinal ROS:  []   Dysphagia  [x]  ABD pain  [x]   nausea  []   vomiting  []   indigestion  []   diarrhea  []   constipation []  Other:  Genitourinary:  []   frequency  []   polyuria  []   nocturia  []   hesitancy  []   urgency  []   hematuria  []   incontinence []  Other:  Musculoskeletal ROS:  []   muscle or joint pain  []   stiffness  []  arthritis  []   gout  []   weakness  []   redness  []   swelling  []   instability []  Other:  Endocrine ROS:  []   heat/cold intolerance  []   sweating  []   excessive thirst or hunger []  Other:  Neurological ROS:  []   Seizures  []   numbness  []   tingling  []   fainting  []   burning  []   tremors []  Other:  Psych ROS:  []   Anxiety  []   depression  []   abnormal thoughts []  Other:  Dermatological ROS:  []   Rash  []   sores  []   lumps  []   skin changes  []   changes to hair or nails []  Other:    Past Medical History:        Diagnosis Date   . Anxiety    . Bipolar affective disorder ([place])    . Cervical fusion syndrome    . Chronic pain    . Depression    . Gestational diabetes mellitus    . Hepatitis C 08/02/2017   . 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 History of heroin abuse    . HPV in female    . Miscarriage    . OCD (obsessive compulsive disorder)    . Paranoia ([place])    . Tobacco abuse        Past Surgical History:        Procedure Laterality Date   . CERVICAL SPINE SURGERY      C1-C2 fusion 2003   . CESAREAN SECTION     . ECTOPIC PREGNANCY SURGERY         Allergies:  Latex; Latex; Cephalexin; and Flagyl  [metronidazole]    Past medical and surgical history reviewed. Any changes have been noted.     Scheduled and prn Medications:    Scheduled Meds:  . pantoprazole  40 mg Intravenous BID    And   . sodium chloride (PF)  10 mL Intravenous BID   . sodium chloride flush  10 mL Intravenous 2 times per day   . enoxaparin  40 mg Subcutaneous Daily   . citalopram  20 mg Oral Daily   . melatonin ER  2 mg Oral Nightly     Continuous Infusions:  . sodium chloride 125 mL/hr at 08/02/17 2021     PRN Meds:.ketorolac, ondansetron, sodium chloride flush, hydrOXYzine, dicyclomine, traMADol **AND** cloNIDine, traZODone    PHYSICAL EXAM:  BP (!) 149/72   Pulse (!) 48   Temp 97.8 F (36.6 C) (Oral)   Resp 16   Ht 5\' 5"  (1.651 m)   Wt 131 lb 3.2 oz (59.5 kg)   SpO2 98%   BMI 21.83 kg/m       Intake/Output Summary (Last 24 hours) at 08/02/17 2021  Last data filed at 08/02/17 0622   Gross per 24 hour   Intake          1865.01 ml   Output                0 ml   Net          1865.01 ml       General: Alert and oriented. Resting in bed in mild distress secondary to pain/nausea.  HEENT: Normocephalic. Atraumatic. Pupils equal and reactive. EOM intact. Oral mucosa pink/moist/intact.  Neck: Supple. Symmetrical. Trachea midline.  Lungs: Clear to auscultation bilaterally. Respirations even and unlabored.  Chest: Exam unremarkable.  Cardiac: S1/S2 noted. Regular Rhythm and rate.  Abdomen/GI: Soft. Epigastric and RUQ tenderness noted.  Non-distended. BS+.  Extremities:  PP+. Atraumatic. No redness/cyanosis/edema noted. Brisk cap refill.  Skin: Dry and intact. No lesions noted.   Neuro: Grossly intact. No focal deficits noted.     LABS:    Lab Results   Component Value Date    WBC 9.0 08/02/2017    HGB 11.3 (L) 08/02/2017    HCT 33.9 (L) 08/02/2017    MCV 88.0 08/02/2017    PLT 133 (L) 08/02/2017    LYMPHOPCT 15.6 08/02/2017    RBC 3.86 (L) 08/02/2017    MCH 29.3 08/02/2017    MCHC 33.4 08/02/2017    RDW 14.0 08/02/2017       Lab Results    Component Value Date    CREATININE <0.5 (L) 08/02/2017    BUN 6 (L) 08/02/2017    NA 142 08/02/2017    K 3.9 08/02/2017    CL 112 (H) 08/02/2017    CO2 20 (L) 08/02/2017       Lab Results   Component Value Date    MG 2.20 08/01/2017       Lab Results   Component Value Date    ALT 66 (H) 08/02/2017    AST 59 (H) 08/02/2017    ALKPHOS 98 08/02/2017    BILITOT 0.3 08/02/2017        No flowsheet data found.    Imaging:  XR CHEST PORTABLE   Final Result   No acute process.         CT ABDOMEN PELVIS W IV CONTRAST Additional Contrast? None   Final Result   1. Periportal edema.  This can be an incidental finding, but recommend   correlation with any clinical findings of hepatocellular disease   2. No gastrointestinal abnormality demonstrated   3. Small amount of free intraperitoneal fluid, and nonspecific omental edema             Assessment & Plan:      Sepsis-unknown origin as evidence by lactic acidosis, fever, and initial tachycardia  Continue IVF  Antibiotics on hold per ID  CBC with diff in AM  Blood cultures negative thus far  Consider TEE if fever recurs  Lactic acid now WNL    Elevated LFTs with abdominal pain  GI consultation appreciated  +Hep C per panel  HIV pending  PRN toradol    Hyperchloremic metabolic acidosis  Continue IVF  BMP in AM    Polysubstance abuse  COWS protocol  Nicotine patch  May need inpatient rehab-will need to refer back to [facility] at [place]    Body mass index is 21.83 kg/m.    The patient was  informed of the results of any tests, a time was given to answer questions, a plan was proposed and they agreed with plan.    DVT prophylaxis: [x]  Lovenox  []  SQ Heparin  []  SCDs because of  []  warfarin/oral direct thrombin inhibitor []  Encourage ambulation    GI prophylaxis: [x]  PPI/H2blocker  []  not indicated    Probiotic if on abx: []  Yes []  No [x]  Not Indicated    Diet: DIET CLEAR LIQUID;    Consults:  IP CONSULT TO INFECTIOUS DISEASES  CHEMICAL DEPENDENCY REFERRAL FOR SOCIAL SERVICE CONSULT  IP  CONSULT TO GI  IP CONSULT TO INFECTIOUS DISEASES    Disposition:  []  Home  []  Home with home health []  Rehab []  Psych []  SNF  []  LTAC  []  Long term nursing home or group home []  Transfer to ICU  []  Transfer to PCU []  Other:  Code Status: Full Code    ELOS: 2-3 more days    [facility], APRN - CNP  08/02/17

## 2017-08-02 NOTE — Progress Notes (Signed)
Patient has slight facial and preorbital swelling.  Genelle Bal, NP, aware and notified. No new orders at this time.  Continue to watch swelling per Genelle Bal, NP.  Will continue to monitor and reassess. Electronically signed by Margaret Pyle, RN on 08/02/2017

## 2017-08-02 NOTE — Consults (Signed)
GASTROENTEROLOGY INPATIENT CONSULTATION      IDENTIFYING DATA/REASON FOR CONSULTATION   PATIENT:  [name] [name]  MRN:  [id number]  ADMIT DATE: 08/01/2017  TIME OF EVALUATION: 08/02/2017 1:56 PM  HOSPITAL STAY:   LOS: 1 day     REASON FOR CONSULTATION:  Abdominal pain, elevated LFTs    HISTORY OF PRESENT ILLNESS   [name] [name] is a 36 y.o. female who we are consulted to see for abdominal pain and elevated LFTs.   She has a PMH of bipolar disorder, OCD, heroin abuse, tobacco abuse, chronic pain and prior c spine fusion surgery. She presented on 08/01/2017 with report of fevers, abdominal pain, nausea and AMS.      Blood work in ER showed elevated transaminases (AST of 195, ALT 87, ALP 152) with normal bilirubin, elevated lactic acid of 2.3 which has since improved, and mildly elevated white count which has since improved.  Lipase was normal.  Urine tox screen was positive for cocaine, opiate, cannabinoid, benzo and amphetamine.  Blood alcohol level was negative.   CT abd/pel showed periportal edema otherwise no acute findings.      At time of visit pt reports of severe aching pain located mid abdomen without radiation, persistent nausea and hot flashes. she states symptoms started yesterday.  She denies any known aggravating or alleviating factors. She reports she snorted heroin over the weekend.  She denies IV drug use.  Denies alcohol use.  She denies any prior known hx of acute hepatitis.  No prior EGD.  Takes NSAID frequently          PAST MEDICAL, SURGICAL, FAMILY, and SOCIAL HISTORY     Past Medical History:   Diagnosis Date   . Anxiety    . Bipolar affective disorder ([place])    . Cervical fusion syndrome    . Chronic pain    . Depression    . Gestational diabetes mellitus    . 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 History of heroin abuse    . HPV in female    . Miscarriage    . OCD (obsessive compulsive disorder)    . Paranoia ([place])    . Tobacco abuse      Past Surgical History:   Procedure Laterality  Date   . CERVICAL SPINE SURGERY      C1-C2 fusion 2003   . CESAREAN SECTION     . ECTOPIC PREGNANCY SURGERY       Family History   Problem Relation Age of Onset   . Arthritis Other    . Asthma Other    . Cancer Other    . Diabetes Other    . Kidney Disease Other      Social History     Social History   . Marital status: Divorced     Spouse name: N/A   . Number of children: 1   . Years of education: N/A     Social History Main Topics   . Smoking status: Current Every Day Smoker     Packs/day: 0.50     Years: 11.00     Types: Cigarettes   . Smokeless tobacco: Never Used   . Alcohol use Yes      Comment: OCC every 2-3 weeks   . Drug use: Yes     Types: Cocaine, Opiates       Comment: states in tx. for heroin   . Sexual activity: Not Asked  Other Topics Concern   . None     Social History Narrative    ** Merged History Encounter **            MEDICATIONS   SCHEDULED:    sodium chloride flush 10 mL 2 times per day   enoxaparin 40 mg Daily   citalopram 20 mg Daily   melatonin ER 2 mg Nightly     FLUIDS/DRIPS:    . sodium chloride 150 mL/hr at 08/02/17 0931     PRNs:   ketorolac 15 mg Q6H PRN   sodium chloride flush 10 mL PRN   hydrOXYzine 50 mg Q8H PRN   dicyclomine 20 mg Q6H PRN   traMADol 50 mg PRN   And     cloNIDine 0.1 mg PRN   traZODone 50 mg Nightly PRN     ALLERGIES:  She @ALLERGYP @    REVIEW OF SYSTEMS   Pertinent ROS noted in HPI    PHYSICAL EXAM   @VSRANGES @   I/O last 3 completed shifts:  In: 1865 [I.V.:1865]  Out: -   Oxygen Therapy:  @FLOWROUNDS24 ([id number])@  @FLOWROUNDS24 ([id number])@  @FLOWROUNDS24 ([id number])@    Physical Exam:  Gen: Resting in bed, NAD   CV: RRR no MRG   Pul: CTAB   Abd: Good bowel sounds throughout, no scars, soft, NT/ND, no masses, no HSM   Ext: No edema   Neuro: No asterixis   Skin: No jaundice, spider angiomas, palmer erythema      LABS AND IMAGING     Recent Results (from the past 24 hour(s))   Urinalysis    Collection Time: 08/01/17  2:38 PM   Result Value Ref Range    Color, UA  DK YELLOW Straw/Yellow    Clarity, UA Clear Clear    Glucose, Ur Negative Negative mg/dL    Bilirubin Urine Negative Negative    Ketones, Urine Negative Negative mg/dL    Specific Gravity, UA >1.030 1.005 - 1.030    Blood, Urine Negative Negative    pH, UA 5.5 5.0 - 8.0    Protein, UA Negative Negative mg/dL    Urobilinogen, Urine 1.0 <2.0 E.U./dL    Nitrite, Urine Negative Negative    Leukocyte Esterase, Urine Negative Negative    Microscopic Examination Not Indicated     Urine Type Not Specified    Lactate, Sepsis    Collection Time: 08/01/17  5:29 PM   Result Value Ref Range    Lactic Acid, Sepsis 1.2 0.4 - 1.9 mmol/L   Lactate, Sepsis    Collection Time: 08/01/17  7:37 PM   Result Value Ref Range    Lactic Acid, Sepsis 1.0 0.4 - 1.9 mmol/L   Lactate, Sepsis    Collection Time: 08/01/17  9:55 PM   Result Value Ref Range    Lactic Acid, Sepsis 1.5 0.4 - 1.9 mmol/L   CBC Auto Differential    Collection Time: 08/02/17 12:36 PM   Result Value Ref Range    WBC 9.0 4.0 - 11.0 K/uL    RBC 3.86 (L) 4.00 - 5.20 M/uL    Hemoglobin 11.3 (L) 12.0 - 16.0 g/dL    Hematocrit 33.9 (L) 36.0 - 48.0 %    MCV 88.0 80.0 - 100.0 fL    MCH 29.3 26.0 - 34.0 pg    MCHC 33.4 31.0 - 36.0 g/dL    RDW 14.0 12.4 - 15.4 %    Platelets 133 (L) 135 - 450 K/uL    MPV 8.6 5.0 -  10.5 fL    Neutrophils % 79.3 %    Lymphocytes % 15.6 %    Monocytes % 4.2 %    Eosinophils % 0.4 %    Basophils % 0.5 %    Neutrophils # 7.2 1.7 - 7.7 K/uL    Lymphocytes # 1.4 1.0 - 5.1 K/uL    Monocytes # 0.4 0.0 - 1.3 K/uL    Eosinophils # 0.0 0.0 - 0.6 K/uL    Basophils # 0.0 0.0 - 0.2 K/uL   Comprehensive Metabolic Panel    Collection Time: 08/02/17 12:37 PM   Result Value Ref Range    Sodium 142 136 - 145 mmol/L    Potassium 3.9 3.5 - 5.1 mmol/L    Chloride 112 (H) 99 - 110 mmol/L    CO2 20 (L) 21 - 32 mmol/L    Anion Gap 10 3 - 16    Glucose 99 70 - 99 mg/dL    BUN 6 (L) 7 - 20 mg/dL    CREATININE <0.5 (L) 0.6 - 1.1 mg/dL    GFR Non-African American >60 >60    GFR  African American >60 >60    Calcium 8.2 (L) 8.3 - 10.6 mg/dL    Total Protein 5.5 (L) 6.4 - 8.2 g/dL    Alb 3.0 (L) 3.4 - 5.0 g/dL    Albumin/Globulin Ratio 1.2 1.1 - 2.2    Total Bilirubin 0.3 0.0 - 1.0 mg/dL    Alkaline Phosphatase 98 40 - 129 U/L    ALT 66 (H) 10 - 40 U/L    AST 59 (H) 15 - 37 U/L    Globulin 2.5 g/dL     Other Labs      Imaging  XR CHEST PORTABLE   Final Result   No acute process.         CT ABDOMEN PELVIS W IV CONTRAST Additional Contrast? None   Final Result   1. Periportal edema.  This can be an incidental finding, but recommend   correlation with any clinical findings of hepatocellular disease   2. No gastrointestinal abnormality demonstrated   3. Small amount of free intraperitoneal fluid, and nonspecific omental edema               ASSESSMENT AND RECOMMENDATIONS   36 y.o. female with a PMH of polysubstance abuse, bipolar disorder, OCD, tobacco abuse, chronic pain and prior c spine fusion surgery. who presented 08/01/2017 with fevers, abdominal pain, nausea and AMS.  We have been consulted regarding elevated LFTs and abd pain    IMPRESSION:  1. Elevated LFTs:  Has hx of polysubstance abuse but denies recent IV drug use.  Admits to snorting heroin over the weekend.  Acute viral hep panel and HIV screen pending.  Denies alcohol use.   Still has her gallbladder.  Bilirubin normal.  No prior EGD.  Hx of frequent NSAID use  2. Abdominal pain, nausea: possible due to acute hepatitis, or polysubstance abuse/withdrawal.  Urine tox screen was positive for cocaine, opiate, cannabinoid, benzo and amphetamine.    3. Polysubstance abuse    RECOMMENDATIONS:    Follow up with acute viral hep panel and HIV screen  Supportive care for now.      If you have any questions or need any further information, please feel free to contact our consult team.  Thank you for allowing [place] to participate in the care of [name] [name].    Darolyn Rua PA-C  Attending physician addendum:      I have personally  seen and examined the patient, reviewed the patient's medical record and pertinent labs and clinical imaging.  I have personally staffed the case with Darolyn Rua PA.  I agree with her consultation note, exam findings, assessment and plans  as written above.  I have made appropriate modifications and edited her assessment and plan where needed to reflect my impression and plans for this patient.  My additions are in bold.    36 year old with history of paranoia, obsessive-compulsive disorder, herniated disc, gestational diabetes, depression, chronic pain, cervical fusion syndrome, bipolar affective disorder, anxiety, cesarean section, ectopic pregnancy surgery, cervical spine C1-C2 fusion. Does snort heroin.  Had relapsed with drugs.  Snorted heroin over the weekend.  She tells me that she has developed sever aching pain in the mid abdomen without radiation.  Acute onset yesterday. No aggravating or alleviating factors.    On exam, abdomen is soft and mildly tender to palpation.  No rebound or guarding.  A&O x 3  Labs showed urea nitrogen 12 and creatinine less than 0.5.  LFTs showed AST 108, ALT 81, alkaline phosphatase 107, bilirubin 0.3, albumin 3.1, lipase 17, CBC with a white blood count 12.4 hemoglobin 10.8 hematocrit 33.1 and platelets 141.  Viral hepatitis panel was positive for hepatitis C antibody and negative for hepatitis B surface antigen and hepatitis A IgM.  CAT scan of the abdomen and pelvis with contrast showed periportal edema, small free intraperitoneal fluid and nonspecific omental edema.  Urine tox screen was positive for cocaine, opiates, cannabinoids, benzodiazepines, and amphetamines.    1.  Acute mid Abdominal pain and fever to 103.  Negative CAT scan.  Polysubstance abuse.  Wonder about withdrawal.  Fever concerning but has not recurred.  No evidence of pancreatitis or cholecystitis.  No gallstones on CT making cholangitis unlikely. No diverticulitis on CT.  Periportal edema is  nonspecific.  Ulcer would not give fevers.  Withdrawal could give some of these symptoms.  Viral gastroenteritis could give the high fevers and abdominal discomfort.  No mesenteric ischemia on CT.  2.  Hepatitis C antibody positive.  We'll need to follow up as an outpatient and will check hepatitis C RNA level and genotype.  3.  Abnormal LFTs likely secondary to #2.    Plan:  1. Get abdominal ultrasound to rule out non-radioopaque gallstones.  2.  Agree with pantoprazole bid.  3.  Follow LFT's.             Thank you for allowing me to participate in this patient's care.  If there are any questions or concerns regarding this patient, or the plan we have set in place, please feel free to contact me at [phone].       York Spaniel, MD

## 2017-08-02 NOTE — Plan of Care (Signed)
Problem: Pain:  Goal: Pain level will decrease  Pain level will decrease   Outcome: Ongoing  Pt assessed for pain. Pt in pain and assessed with 0-10 pain rating scale. Pt given prescribed analgesic for pain. (See eMar) Will reassess and continue to monitor. Electronically signed by Margaret Pyle, RN on 08/02/2017 at 1:45 PM  Goal: Control of acute pain  Control of acute pain   Outcome: Ongoing  Pt assessed for pain. Pt in pain and assessed with 0-10 pain rating scale. Pt given prescribed analgesic for pain. (See eMar) Will reassess and continue to monitor. Electronically signed by Margaret Pyle, RN on 08/02/2017 at 1:45 PM  Goal: Control of chronic pain  Control of chronic pain   Outcome: Ongoing  Pt assessed for pain. Pt in pain and assessed with 0-10 pain rating scale. Pt given prescribed analgesic for pain. (See eMar) Will reassess and continue to monitor. Electronically signed by Margaret Pyle, RN on 08/02/2017 at 1:45 PM    Problem: Falls - Risk of:  Goal: Will remain free from falls  Will remain free from falls   Outcome: Ongoing  Fall risk assessment completed. Fall precautions in place. Call light within reach. Pt educated on calling for assistance before getting up. Walkway free of clutter. Will continue to monitor. Electronically signed by Margaret Pyle, RN on 08/02/2017 at 1:46 PM      Goal: Absence of physical injury  Absence of physical injury   Outcome: Ongoing  Fall risk assessment completed. Fall precautions in place. Call light within reach. Pt educated on calling for assistance before getting up. Walkway free of clutter. Will continue to monitor. Electronically signed by Margaret Pyle, RN on 08/02/2017 at 1:46 PM

## 2017-08-02 NOTE — Progress Notes (Signed)
Patient c/o nausea.  Genelle Bal, NP, aware and notified.  See new order for IV zofran.  Will continue to monitor and reassess. Electronically signed by Margaret Pyle, RN on 08/02/2017

## 2017-08-02 NOTE — Care Coordination-Inpatient (Signed)
Patient interviewed by SW student: patient lives with her father and is unsure if she will return to his home as they have a strained relationship. She may be interested in going to a shelter. She reported that she has been active with [facility], and she may re-contact them again about services and housing options. She was open to receiving substance abuse treatment information.   Shelter and substance abuse information given to patient.     Electronically signed by Marion Downer on 08/02/2017 at 3:38 PM

## 2017-08-02 NOTE — Consults (Signed)
Infectious Diseases Inpatient Consult Note      Reason for Consult: Fevers and abnormal Lfts - Drug use     Requesting Physician:  Dr. Deatra Canter     Primary Care Physician:  [facility]    History Obtained From:  Pt and Medical records     CHIEF COMPLAINT:     Chief Complaint   Patient presents with   . Nausea     pt ambulatory to ER for c/o nausea, reports that she just signed out AMA from inpatient unit for "infection"         HISTORY OF PRESENT ILLNESS:  36 y.o. woman with Drug use, Bi polar, Depression, OCD, Heroin abuse, poly substance abuse  Admitted through ED with fevers and abd pain, UDS positive for cocaine, opiates, amphetamine- she admits snorting cocaine and not used IV per patient, was seen ED on 10/1 left AMA and came back in the evening for admission, T max 103 and since no fevers in hospital , Lft mild elevation Enzymes and transient WBC elevation with lactic acidosis and follow up resolved. Hepatitis panel pending. She has RT side abd pain and CT on admit mild portal edema no gall stones or gall bladder disease.         Past Medical History:    Past Medical History:   Diagnosis Date   . Anxiety    . Bipolar affective disorder ([place])    . Cervical fusion syndrome    . Chronic pain    . Depression    . Gestational diabetes mellitus    . 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 History of heroin abuse    . HPV in female    . Miscarriage    . OCD (obsessive compulsive disorder)    . Paranoia ([place])    . Tobacco abuse        Past Surgical History:    Past Surgical History:   Procedure Laterality Date   . CERVICAL SPINE SURGERY      C1-C2 fusion 2003   . CESAREAN SECTION     . ECTOPIC PREGNANCY SURGERY         Current Medications:    Outpatient Prescriptions Marked as Taking for the 08/01/17 encounter [facility] Encounter)   Medication Sig Dispense Refill   . buprenorphine-naloxone (SUBOXONE) 8-2 MG FILM SL film Place 2 Film under the tongue daily..     . cloNIDine (CATAPRES) 0.1 MG tablet Take 0.1  mg by mouth every 12 hours as needed for High Blood Pressure (restlessness / withdrawal)     . hydrOXYzine (ATARAX) 25 MG tablet Take 25 mg by mouth 4 times daily as needed for Itching or Anxiety     . citalopram (CELEXA) 20 MG tablet Take 20 mg by mouth daily          Allergies:  Latex; Latex; Cephalexin; and Flagyl [metronidazole]    Immunizations :   There is no immunization history on file for this patient.      Social History:   Social History   Substance Use Topics   . Smoking status: Current Every Day Smoker     Packs/day: 0.50     Years: 11.00     Types: Cigarettes   . Smokeless tobacco: Never Used   . Alcohol use Yes      Comment: OCC every 2-3 weeks     History   Smoking Status   . Current Every Day Smoker   .  Packs/day: 0.50   . Years: 11.00   . Types: Cigarettes   Smokeless Tobacco   . Never Used      Family History   Problem Relation Age of Onset   . Arthritis Other    . Asthma Other    . Cancer Other    . Diabetes Other    . Kidney Disease Other         REVIEW OF SYSTEMS:    No fever / chills / sweats.  No weight loss.  No visual change, eye pain, eye discharge.    No oral lesion, sore throat, dysphagia.  Denies cough / sputum/Sob   Denies chest pain, palpitations/ dizziness  Denies nausea/ vomiting/abdominal pain/diarrhea.  Denies dysuria or change in urinary function.  Denies joint swelling or pain.  No myalgia, arthralgia.  No rashes, skin lesions   Denies focal weakness, sensory change or other neurologic symptoms  No lymph node swelling or tenderness.    Fevers, abd pain     PHYSICAL EXAM:      Vitals:  103.5 T max   BP 136/86   Pulse 67   Temp 98.2 F (36.8 C) (Oral)   Resp 16   Ht 5\' 5"  (1.651 m)   Wt 131 lb 3.2 oz (59.5 kg)   SpO2 98%   BMI 21.83 kg/m     General Appearance: sleepy following commands poor eye contact+ ,in some acute distress, no pallor, no icterus   Skin: warm and dry, no rash or erythema  Head: normocephalic and atraumatic  Eyes: pupils equal, round, and reactive to  light, conjunctivae normal  ENT: tympanic membrane, external ear and ear canal normal bilaterally, nose without deformity, nasal mucosa and turbinates normal without polyps  Neck: supple and non-tender without mass, no thyromegaly  no cervical lymphadenopathy  Pulmonary/Chest: clear to auscultation bilaterally- no wheezes, rales or rhonchi, normal air movement, no respiratory distress  Cardiovascular: normal rate, regular rhythm, normal S1 and S2, no murmurs, rubs, clicks, or gallops, no carotid bruits  Abdomen: soft, non-tender, non-distended, normal bowel sounds, no masses or organomegaly  Extremities: no cyanosis, clubbing or edema  Musculoskeletal: normal range of motion, no joint swelling, deformity or tenderness  Neurologic: reflexes normal and symmetric, no cranial nerve deficit  Psych:  Orientation, sensorium, mood normal  Lines:  IV  Needle track         DATA:    Lab Results   Component Value Date    WBC 12.4 (H) 08/01/2017    HGB 10.8 (L) 08/01/2017    HCT 33.1 (L) 08/01/2017    MCV 87.3 08/01/2017    PLT 141 08/01/2017     Lab Results   Component Value Date    CREATININE <0.5 (L) 08/01/2017    BUN 12 08/01/2017    NA 134 (L) 08/01/2017    K 4.9 08/01/2017    CL 101 08/01/2017    CO2 24 08/01/2017       Hepatic Function Panel:   Lab Results   Component Value Date    ALKPHOS 107 08/01/2017    ALT 81 08/01/2017    AST 108 08/01/2017    PROT 5.6 08/01/2017    PROT 7.1 01/17/2010    BILITOT 0.3 08/01/2017    LABALBU 3.1 08/01/2017     UA:  Lab Results   Component Value Date    COLORU DK YELLOW 08/01/2017    CLARITYU Clear 08/01/2017    GLUCOSEU Negative 08/01/2017    GLUCOSEU  Negative 02/05/2011    BILIRUBINUR Negative 08/01/2017    BILIRUBINUR Negative 02/05/2011    KETUA Negative 08/01/2017    SPECGRAV >1.030 08/01/2017    BLOODU Negative 08/01/2017    PHUR 5.5 08/01/2017    PROTEINU Negative 08/01/2017    UROBILINOGEN 1.0 08/01/2017    NITRU Negative 08/01/2017    LEUKOCYTESUR Negative 08/01/2017    LABMICR  Not Indicated 08/01/2017    URINETYPE Not Specified 08/01/2017      Urine Microscopic:   Lab Results   Component Value Date    BACTERIA Rare 05/22/2015    WBCUA 0-2 08/01/2017    RBCUA 0-2 08/01/2017    EPIU 0-2 08/01/2017         Scheduled Meds:  . sodium chloride flush  10 mL Intravenous 2 times per day   . enoxaparin  40 mg Subcutaneous Daily   . citalopram  20 mg Oral Daily   . melatonin ER  2 mg Oral Nightly       Continuous Infusions:  . sodium chloride 150 mL/hr at 08/02/17 0931       PRN Meds:  ketorolac, sodium chloride flush, hydrOXYzine, dicyclomine, traMADol **AND** cloNIDine, traZODone    Lactic acid  2.4     MICRO: cultures reviewed and updated by me   Date/Time       Culture blood #2 [[id number]] Collected: 08/01/17 1438   Order Status: Sent Specimen: Blood Updated: 08/01/17 1454   Culture blood #1 [[id number]] Collected: 08/01/17 1320   Order Status: Sent Specimen: Blood from Blood Updated: 08/01/17 1335   CULTURE BLOOD #1 [[id number]] Collected: 08/01/17 0000   Order Status: Canceled Specimen: Blood from Blood    CULTURE BLOOD #2 [[id number]] Collected: 08/01/17 0000   Order Status: Canceled Specimen: Blood       Ref Range & Units 08/01/17 0225   Amphetamine Screen, Urine Negative <1000ng/mL POSITIVE     Comment: High concentrations of ephedrine/pseudoephedrine or   phenylpropanolamine may cause false positive results   for amphetamine. Therefore, confirmatory testing for   amphetamine should be considered if clinically indicated.    Barbiturate Screen, Ur Negative <200 ng/mL Neg    Benzodiazepine Screen, Urine Negative <200 ng/mL POSITIVE     Cannabinoid Scrn, Ur Negative <50 ng/mL POSITIVE     Cocaine Metabolite Screen, Urine Negative <300 ng/mL POSITIVE     Opiate Scrn, Ur Negative <300 ng/mL POSITIVE     Comment: "Therapeutic levels of pain medication, especially oxycontin and synthetic   opioids, may not be detected by this Methodology. Pain management screen       Ref Range    Gram Stain Rare  Epithelial Cells, SquamousNo Polymorphonuclear Leukocytes SeenMany Gram Positive Cocci In Clusters (H)    Culture Result: Heavy Growth (H)    Oxacillin >2 (R)    Levofloxacin 4 (I)    Ciprofloxacin >2 (R)    Gentamicin <=1 (S)    Erythromycin >4 (R)    Clindamycin >4 (R)    Tetracycline <=1 (S)    Sulfa/Trimethoprim <=0.5/9.5 (S)    Vancomycin 1 (S)    Culture Result: Staph aureus, MRSA (Methicillin Resistant)Susceptibility testing for Oxacillin against Staphylococci predicts   susceptibility or resistance to all beta-lactamase stable penicillins, beta-lactamase   inhibitor combinations, relevant cephalosporins, and carbapenems. (H)    Organism 2 Light Growth (H)    Piperacillin/Tazobactam <=16 (S)    Cefuroxime >16 (R)    Ceftriaxone <=8 (S)    Cefepime <=4 (S)  Meropenem <=1 (S)    Levofloxacin <=2 (S)    Gentamicin <=4 (S)    Tobramicin <=4 (S)    Sulfa/Trimethoprim <=2/38 (S)    Organism 2 Enterobacter cloacae (H)    Specimen   SWAB     [facility]   Component Name Value Ref Range   SPECIMEN SOURCE CERVICAL    SPECIMEN TYPE SWAB    C. TRACHOMATIS BY AMP RNA Not Detected Not Detected    N. GONORRHOEAE BY AMP RNA Not Detected   Comment:  (NOTE)  Testing methodology is transcription mediated amplification (TMA)  using the Aptima Combo 2 assay from [employer].   A negative result does not completely rule out a Chlamydia  trachomatis or Neisseria gonorrhoeae infection due to potential  inhibitors or levels present below the        0.01   Comment:  (NOTE)  < 1.00 - Negative  >=1.00 - Positive   NOTE: All positive results will be reflexed to Quantitative  Non-Treponemal(RPR)test.    Tested at: [facility], [address] [address] <=7.06 INDEX VALUE   Specimen         Urine Culture  No results for input(s): LABURIN in the last 72 hours.  Imaging:   XR CHEST PORTABLE   Final Result   No acute process.         CT ABDOMEN PELVIS W IV CONTRAST Additional Contrast? None    Final Result   1. Periportal edema.  This can be an incidental finding, but recommend   correlation with any clinical findings of hepatocellular disease   2. No gastrointestinal abnormality demonstrated   3. Small amount of free intraperitoneal fluid, and nonspecific omental edema             All pertinent images and reports for the current Hospitalization were reviewed by me.    IMPRESSION:    Polysubstance abuse  IVDA  ABDOMINAL pain - CT abd/pelvis some peri portal edema  Lfts EELEVATION in AST and ALT noted  Blood cx -ve  Urine cx -ve  fEVERS resolved  Lactic acid elevation corrected Quickly   H/o MRSA infection in the past     Labs, Microbiology, Radiology and pertinent results from current hospitalization and care every where were reviewed by me as a part of the consultation.    PLAN :  1. Check Hepatitis panel   2. HIV screen   3.Blood cx pending   4. Would hold off abx therapy pending further information   5. CBC and Lfts changes would be from drug use or Hepatitis   6.Recent  STD screen out side hospital was negative    7.MRSA probe     Discussed with patient/Family d/w RN     Thanks for allowing me to participate in your patient's care please call me with any questions or concerns.    Dr. Allen Kell MD  [place] Physician  Phone: [phone]   Fax : [phone]

## 2017-08-03 ENCOUNTER — Inpatient Hospital Stay: Admit: 2017-08-03 | Payer: PRIVATE HEALTH INSURANCE | Primary: Family Medicine

## 2017-08-03 LAB — CBC WITH AUTO DIFFERENTIAL
Basophils %: 0.6 %
Basophils Absolute: 0 10*3/uL (ref 0.0–0.2)
Eosinophils %: 0.7 %
Eosinophils Absolute: 0 10*3/uL (ref 0.0–0.6)
Hematocrit: 34.7 % — ABNORMAL LOW (ref 36.0–48.0)
Hemoglobin: 11.7 g/dL — ABNORMAL LOW (ref 12.0–16.0)
Lymphocytes %: 20.2 %
Lymphocytes Absolute: 1.4 10*3/uL (ref 1.0–5.1)
MCH: 29.8 pg (ref 26.0–34.0)
MCHC: 33.6 g/dL (ref 31.0–36.0)
MCV: 88.8 fL (ref 80.0–100.0)
MPV: 8.7 fL (ref 5.0–10.5)
Monocytes %: 3.8 %
Monocytes Absolute: 0.3 10*3/uL (ref 0.0–1.3)
Neutrophils %: 74.7 %
Neutrophils Absolute: 5.3 10*3/uL (ref 1.7–7.7)
Platelets: 154 10*3/uL (ref 135–450)
RBC: 3.91 M/uL — ABNORMAL LOW (ref 4.00–5.20)
RDW: 13.9 % (ref 12.4–15.4)
WBC: 7 10*3/uL (ref 4.0–11.0)

## 2017-08-03 LAB — EKG 12-LEAD
Atrial Rate: 75 {beats}/min
P Axis: 73 degrees
P-R Interval: 166 ms
Q-T Interval: 428 ms
QRS Duration: 86 ms
QTc Calculation (Bazett): 477 ms
R Axis: 86 degrees
T Axis: 56 degrees
Ventricular Rate: 75 {beats}/min

## 2017-08-03 LAB — COMPREHENSIVE METABOLIC PANEL
ALT: 54 U/L — ABNORMAL HIGH (ref 10–40)
AST: 39 U/L — ABNORMAL HIGH (ref 15–37)
Albumin/Globulin Ratio: 1.1 (ref 1.1–2.2)
Albumin: 2.9 g/dL — ABNORMAL LOW (ref 3.4–5.0)
Alkaline Phosphatase: 93 U/L (ref 40–129)
Anion Gap: 16 (ref 3–16)
BUN: 9 mg/dL (ref 7–20)
CO2: 17 mmol/L — ABNORMAL LOW (ref 21–32)
Calcium: 8.1 mg/dL — ABNORMAL LOW (ref 8.3–10.6)
Chloride: 108 mmol/L (ref 99–110)
Creatinine: 0.5 mg/dL — ABNORMAL LOW (ref 0.6–1.1)
GFR African American: >60 (ref >60)
GFR Non-African American: >60 (ref >60)
Globulin: 2.6 g/dL
Glucose: 67 mg/dL — ABNORMAL LOW (ref 70–99)
Potassium: 3.7 mmol/L (ref 3.5–5.1)
Sodium: 141 mmol/L (ref 136–145)
Total Bilirubin: 0.3 mg/dL (ref 0.0–1.0)
Total Protein: 5.5 g/dL — ABNORMAL LOW (ref 6.4–8.2)

## 2017-08-03 LAB — MRSA DNA PROBE, NASAL: MRSA SCREEN RT-PCR: POSITIVE — AB

## 2017-08-03 LAB — HIV SCREEN
HIV ANTIGEN: NONREACTIVE
HIV Ag/Ab: NONREACTIVE
HIV-1 Antibody: NONREACTIVE
HIV-2 Ab: NONREACTIVE

## 2017-08-03 MED ORDER — MUPIROCIN 2 % EX OINT
2 % | Freq: Two times a day (BID) | CUTANEOUS | Status: DC
Start: 2017-08-03 — End: 2017-08-04
  Administered 2017-08-03 – 2017-08-04 (×3): via TOPICAL

## 2017-08-03 MED ORDER — HYDROMORPHONE HCL 1 MG/ML IJ SOLN
1 MG/ML | Freq: Once | INTRAMUSCULAR | Status: AC
Start: 2017-08-03 — End: 2017-08-03
  Administered 2017-08-03: 1 mg via INTRAVENOUS

## 2017-08-03 MED ORDER — HYDROMORPHONE HCL 1 MG/ML IJ SOLN
1 MG/ML | Freq: Once | INTRAMUSCULAR | Status: AC
Start: 2017-08-03 — End: 2017-08-03
  Administered 2017-08-03: 17:00:00 1 mg via INTRAVENOUS

## 2017-08-03 MED ORDER — NICOTINE 14 MG/24HR TD PT24
1424 MG/24HR | Freq: Every day | TRANSDERMAL | Status: DC
Start: 2017-08-03 — End: 2017-08-04
  Administered 2017-08-03 – 2017-08-04 (×3): 1 via TRANSDERMAL

## 2017-08-03 MED FILL — TRAMADOL HCL 50 MG PO TABS: 50 MG | ORAL | Qty: 1

## 2017-08-03 MED FILL — MUPIROCIN 2 % EX OINT: 2 % | CUTANEOUS | Qty: 22

## 2017-08-03 MED FILL — CLONIDINE HCL 0.1 MG PO TABS: 0.1 MG | ORAL | Qty: 1

## 2017-08-03 MED FILL — ONDANSETRON HCL 4 MG/2ML IJ SOLN: 4 MG/2ML | INTRAMUSCULAR | Qty: 2

## 2017-08-03 MED FILL — NICOTINE 14 MG/24HR TD PT24: 14 MG/24HR | TRANSDERMAL | Qty: 1

## 2017-08-03 MED FILL — KETOROLAC TROMETHAMINE 15 MG/ML IJ SOLN: 15 MG/ML | INTRAMUSCULAR | Qty: 1

## 2017-08-03 MED FILL — HYDROMORPHONE HCL 1 MG/ML IJ SOLN: 1 MG/ML | INTRAMUSCULAR | Qty: 1

## 2017-08-03 MED FILL — PROTONIX 40 MG IV SOLR: 40 MG | INTRAVENOUS | Qty: 40

## 2017-08-03 MED FILL — LOVENOX 40 MG/0.4ML SC SOLN: 40 MG/0.4ML | SUBCUTANEOUS | Qty: 0.4

## 2017-08-03 MED FILL — TRAZODONE HCL 50 MG PO TABS: 50 MG | ORAL | Qty: 1

## 2017-08-03 MED FILL — DICYCLOMINE HCL 20 MG PO TABS: 20 MG | ORAL | Qty: 1

## 2017-08-03 MED FILL — CITALOPRAM HYDROBROMIDE 20 MG PO TABS: 20 MG | ORAL | Qty: 1

## 2017-08-03 MED FILL — HYDROXYZINE PAMOATE 25 MG PO CAPS: 25 MG | ORAL | Qty: 2

## 2017-08-03 MED FILL — SODIUM CHLORIDE 0.9 % IV SOLN: 0.9 % | INTRAVENOUS | Qty: 1000

## 2017-08-03 MED FILL — MELATONIN TR 1 MG PO TBCR: 1 MG | ORAL | Qty: 2

## 2017-08-03 MED FILL — SODIUM CHLORIDE 0.9 % IJ SOLN: 0.9 % | INTRAMUSCULAR | Qty: 10

## 2017-08-03 NOTE — Progress Notes (Signed)
Infectious Disease Follow up Notes  Admit Date: 08/01/2017  Hospital Day: 3    Antibiotics : None     CHIEF COMPLAINT:    Fever  Hepatitis  Abd pain   Drug use         Subjective interval History :  36 y.o.  woman with Drug use, Bi polar, Depression, OCD, Heroin abuse, poly substance abuse  Admitted through ED with fevers and abd pain, UDS positive for cocaine, opiates, amphetamine- she admits snorting cocaine and not used IV per patient, was seen ED on 10/1 left AMA and came back in the evening for admission, T max 103 and since no fevers in hospital , Lft mild elevation Enzymes and transient WBC elevation with lactic acidosis and follow up resolved. Hepatitis panel pending. She has RT side abd pain and CT on admit mild portal edema no gall stones or gall bladder disease.   No fevers Blood cx negative [facility] results note and pt wants to eat  HIV screen -ve  MRSA probe +ve      Past Medical History:    Past Medical History:   Diagnosis Date   . Anxiety    . Bipolar affective disorder ([place])    . Cervical fusion syndrome    . Chronic pain    . Depression    . Gestational diabetes mellitus    . Hepatitis C 08/02/2017   . 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 History of heroin abuse    . HPV in female    . Miscarriage    . 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 OCD (obsessive compulsive disorder)    . Paranoia ([place])    . Tobacco abuse        Past Surgical History:    Past Surgical History:   Procedure Laterality Date   . CERVICAL SPINE SURGERY      C1-C2 fusion 2003   . CESAREAN SECTION     . ECTOPIC PREGNANCY SURGERY         Current Medications:    Outpatient Prescriptions Marked as Taking for the 08/01/17 encounter [facility] Encounter)   Medication Sig Dispense Refill   . buprenorphine-naloxone (SUBOXONE) 8-2 MG FILM SL film Place 2 Film under the tongue daily..     . cloNIDine (CATAPRES) 0.1 MG tablet Take 0.1 mg by mouth every 12 hours as needed for  High Blood Pressure (restlessness / withdrawal)     . hydrOXYzine (ATARAX) 25 MG tablet Take 25 mg by mouth 4 times daily as needed for Itching or Anxiety     . citalopram (CELEXA) 20 MG tablet Take 20 mg by mouth daily          Allergies:  Latex; Latex; Cephalexin; and Flagyl [metronidazole]    Immunizations :   There is no immunization history on file for this patient.    Social History:     Social History   Substance Use Topics   . Smoking status: Current Every Day Smoker     Packs/day: 0.50     Years: 11.00     Types: Cigarettes   . Smokeless tobacco: Never Used   . Alcohol use Yes      Comment: OCC every 2-3 weeks     History   Smoking Status   . Current Every Day Smoker   . Packs/day: 0.50   . Years: 11.00   . Types: Cigarettes   Smokeless Tobacco   . Never Used  Family History   Problem Relation Age of Onset   . Arthritis Other    . Asthma Other    . Cancer Other    . Diabetes Other    . Kidney Disease Other         REVIEW OF SYSTEMS:    No fever / chills / sweats.  No weight loss.  No visual change, eye pain, eye discharge.    No oral lesion, sore throat, dysphagia.  Denies cough / sputum/Sob   Denies chest pain, palpitations/ dizziness  Denies nausea/ vomiting/abdominal pain/diarrhea.  Denies dysuria or change in urinary function.  Denies joint swelling or pain.  No myalgia, arthralgia.  No rashes, skin lesions   Denies focal weakness, sensory change or other neurologic symptoms  No lymph node swelling or tenderness.        PHYSICAL EXAM:      Vitals:    BP (!) 157/83   Pulse 79   Temp 98.4 F (36.9 C) (Oral)   Resp 16   Ht 5\' 5"  (1.651 m)   Wt 131 lb 3.2 oz (59.5 kg)   SpO2 98%   BMI 21.83 kg/m     General Appearance: alert,in no acute distress, no pallor, no icterus   Skin: warm and dry, no rash or erythema  Head: normocephalic and atraumatic  Eyes: pupils equal, round, and reactive to light, conjunctivae normal  ENT: tympanic membrane, external ear and ear canal normal bilaterally, nose  without deformity, nasal mucosa and turbinates normal without polyps  Neck: supple and non-tender without mass, no thyromegaly  no cervical lymphadenopathy  Pulmonary/Chest: clear to auscultation bilaterally- no wheezes, rales or rhonchi, normal air movement, no respiratory distress  Cardiovascular: normal rate, regular rhythm, normal S1 and S2, no murmurs, rubs, clicks, or gallops, no carotid bruits  Abdomen: soft, non-tender, non-distended, normal bowel sounds, no masses or organomegaly  Extremities: no cyanosis, clubbing or edema  Musculoskeletal: normal range of motion, no joint swelling, deformity or tenderness  Neurologic: reflexes normal and symmetric, no cranial nerve deficit  Psych:  Orientation, sensorium, mood normal  Lines:  IV     Data Review:    Lab Results   Component Value Date    WBC 7.0 08/03/2017    HGB 11.7 (L) 08/03/2017    HCT 34.7 (L) 08/03/2017    MCV 88.8 08/03/2017    PLT 154 08/03/2017     Lab Results   Component Value Date    CREATININE 0.5 (L) 08/03/2017    BUN 9 08/03/2017    NA 141 08/03/2017    K 3.7 08/03/2017    CL 108 08/03/2017    CO2 17 (L) 08/03/2017       Hepatic Function Panel:   Lab Results   Component Value Date    ALKPHOS 93 08/03/2017    ALT 54 08/03/2017    AST 39 08/03/2017    PROT 5.5 08/03/2017    PROT 7.1 01/17/2010    BILITOT 0.3 08/03/2017    LABALBU 2.9 08/03/2017       UA:  Lab Results   Component Value Date    COLORU DK YELLOW 08/01/2017    CLARITYU Clear 08/01/2017    GLUCOSEU Negative 08/01/2017    GLUCOSEU Negative 02/05/2011    BILIRUBINUR Negative 08/01/2017    BILIRUBINUR Negative 02/05/2011    KETUA Negative 08/01/2017    SPECGRAV >1.030 08/01/2017    BLOODU Negative 08/01/2017    PHUR 5.5 08/01/2017    PROTEINU  Negative 08/01/2017    UROBILINOGEN 1.0 08/01/2017    NITRU Negative 08/01/2017    LEUKOCYTESUR Negative 08/01/2017    LABMICR Not Indicated 08/01/2017    URINETYPE Not Specified 08/01/2017      Urine Microscopic:   Lab Results   Component Value  Date    BACTERIA Rare 05/22/2015    WBCUA 0-2 08/01/2017    RBCUA 0-2 08/01/2017    EPIU 0-2 08/01/2017       AlT :  54   AST   39   Screen, Urine Negative <1000ng/mL POSITIVE     Comment: High concentrations of ephedrine/pseudoephedrine or   phenylpropanolamine may cause false positive results   for amphetamine. Therefore, confirmatory testing for   amphetamine should be considered if clinically indicated.    Barbiturate Screen, Ur Negative <200 ng/mL Neg    Benzodiazepine Screen, Urine Negative <200 ng/mL POSITIVE     Cannabinoid Scrn, Ur Negative <50 ng/mL POSITIVE     Cocaine Metabolite Screen, Urine Negative <300 ng/mL POSITIVE     Opiate Scrn, Ur Negative <300 ng/mL POSITIVE     Comment: "Therapeutic levels of pain medication, especially oxycontin and synthetic   opioids, may not be detected by this Methodology. Pain management screen            MICRO: cultures reviewed and updated by me   Procedure Component Value Units Date/Time   Clostridium Difficile Toxin/Antigen [[id number]]    Order Status: No result Specimen: Stool from Stool    MRSA DNA Probe, Nasal [[id number]] (Abnormal) Collected: 08/02/17 1500   Order Status: Completed Specimen: Nasal from Nares Updated: 08/03/17 0410    MRSA SCREEN RT-PCR --    Organism Staph aureus MRSA (A)    MRSA SCREEN RT-PCR -- (A)    POSITIVE for   Normal Range: Not detected   CONTACT PRECAUTIONS INDICATED    Narrative:    ORDER#: [id number]             ORDERED BY: Allen Kell  SOURCE: Nares               COLLECTED: 08/02/17 15:00  ANTIBIOTICS AT COLL.:           RECEIVED : 08/02/17 15:25  CALL Ward [id number] tel. [phone],  Microbiology results called to and read back by rn netty yanko, 08/03/2017  04:08, by [facility]  Performed at:  [facility]  [address] [place] [address]  Phone [phone]   Culture blood #2 [[id number]] Collected: 08/01/17 1438   Order Status: Completed Specimen:  Blood Updated: 08/02/17 1615    Culture, Blood 2 No Growth to date. Any change in status will be called.   Narrative:    ORDER#: [id number]             ORDERED BY:  SOURCE: Blood Antecubital-Left       COLLECTED: 08/01/17 14:38  ANTIBIOTICS AT COLL.:           RECEIVED : 08/01/17 14:54  If child <=2 yrs old please draw pediatric bottle.~Blood Culture #2  Performed at:  [facility]  [address] [place] [address]  Phone [phone]   Culture blood #1 [[id number]] Collected: 08/01/17 1320   Order Status: Completed Specimen: Blood from Blood Updated: 08/02/17 1515    Blood Culture, Routine No Growth to date. Any change in status will be called.   Narrative:    ORDER#: [id number]  ORDERED BY:  SOURCE: Blood               COLLECTED: 08/01/17 13:20  ANTIBIOTICS AT COLL.:           RECEIVED : 08/01/17 13:35  If child <=2 yrs old please draw pediatric bottle.~Blood Culture #1  Performed at:  [facility]  [address] [place] [address]  Phone [phone]   CULTURE BLOOD #1 [[id number]] Collected: 08/01/17 0000   Order Status: Canceled Specimen: Blood from Blood    CULTURE BLOOD #2 [[id number]] Collected: 08/01/17 0000   Order Status: Canceled Specimen: Blood            IMAGING:    US ABDOMEN LIMITED   Final Result   Hepatic periportal edema, which can be seen in the setting of hepatitis.      Significant diffuse gallbladder wall thickening without cholelithiasis.   Findings may be reactive.  No sonographic Murphy's sign.  No common bile duct   dilation.         XR CHEST PORTABLE   Final Result   No acute process.         CT ABDOMEN PELVIS W IV CONTRAST Additional Contrast? None   Final Result   1. Periportal edema.  This can be an incidental finding, but recommend   correlation with any clinical findings of hepatocellular disease   2. No  gastrointestinal abnormality demonstrated   3. Small amount of free intraperitoneal fluid, and nonspecific omental edema               All the pertinent images and reports for the current Hospitalization were reviewed by me     Scheduled Meds:  . mupirocin   Topical BID   . pantoprazole  40 mg Intravenous BID    And   . sodium chloride (PF)  10 mL Intravenous BID   . nicotine  1 patch Transdermal Daily   . sodium chloride flush  10 mL Intravenous 2 times per day   . enoxaparin  40 mg Subcutaneous Daily   . citalopram  20 mg Oral Daily   . melatonin ER  2 mg Oral Nightly       Continuous Infusions:      PRN Meds:  ketorolac, ondansetron, sodium chloride flush, hydrOXYzine, dicyclomine, traMADol **AND** cloNIDine, traZODone      Assessment:   Polysubstance abuse  IVDA  ABDOMINAL pain - CT abd/pelvis some peri portal edema  Lfts EELEVATION in AST and ALT noted  Blood cx -ve  Urine cx -ve  fEVERS resolved  Lactic acid elevation corrected Quickly   H/o MRSA infection in the past       Labs, Microbiology, Radiology and all the pertinent results from current hospitalization and  care every where were reviewed  by me as a part of the evaluation   Plan:   1. Hep C+Ve  2. HIV screen  -ve  3.Blood cx -ve  4. Would hold off abx therapy for now  5. CBC and Lfts changes would be from drug use or Hepatitis   6.Recent  STD screen out side hospital was negative    7.MRSA probe +  8.ID TO sign off       Discussed with patient/Family d/w RN and Primary team     Thanks for allowing me to participate in your patient's care and please call me with any questions or concerns.    Loran Senters MD  Infectious  Disease  [facility] Physician  Phone: [phone]   Fax : [phone]

## 2017-08-03 NOTE — Progress Notes (Signed)
Patient A&O in bed. Patient tolerating PO intake. Patient denies pain, nausea, and vomiting at this time. Call light within reach. Bed in lowest position and wheels locked. 2/4 bed rails up. Able to make needs known. Fall precautions in place. Will continue to monitor and assess.

## 2017-08-03 NOTE — Plan of Care (Signed)
Problem: Pain:  Goal: Control of acute pain  Control of acute pain   Outcome: Ongoing  Pain/discomfort being managed with PRN analgesics per MD orders. Pt able to express presence and absence of pain and rate pain appropriately using numerical scale.      Problem: Falls - Risk of:  Goal: Will remain free from falls  Will remain free from falls   Outcome: Ongoing  Fall risk assessment completed. Fall precautions in place. Call light within reach. Pt educated on calling for assistance before getting up. Walkway free of clutter. Will continue to monitor.     Goal: Absence of physical injury  Absence of physical injury   Outcome: Ongoing  Pt is free of injury. No injury noted. Fall precautions in place. Call light within reach. Will monitor.

## 2017-08-03 NOTE — Progress Notes (Signed)
Hospital Medicine Progress Note      Admit Date: 08/01/2017         Overnight Events: none    CC: F/U for abdominal pain, fever    HPI: The patient is a 36 y.o. black female with a medical history that includes polysubstance abuse including heroin for which she has used IV in the distant past and bipolar affective disorder who returned to [facility]  after being admitted with fever of unknown origin noted to be 103.5 F and altered mental status for which she left AMA. Patient had admitted that she just recently has relapsed over the weekend and has been snorting heroin.  Patient has been in active outpatient rehab at [facility] and had been on outpatient Suboxone therapy prior to her relapse.  She reported epigastric and RUQ pain that is sharp in nature radiating into her shoulders associated with nausea.  She also reported some vaginal discharge and was recently seen at the [facility] clinic.  Initial blood cultures are negative. CXR did not demonstrate any acute process.  UA was negative.  CT of the abdomen and pelvis demonstrated periportal edema and a small amount of free intraperitoneal fluid and omental fluid.  LFTs are elevated. Initial lactic acid was 2.3 in the setting of initital fever and tachycardia, thereby meeting sepsis criteria.  Infectious disease was consulted.  She received IV vancomycin and zosyn in the ER.  Antibiotics on hold per ID.  UDS was positive for for amphetamine, benzos, THC, opiates, and cocaine. Abdominal ultrasound 08/03/17 per GI demonstrates diffuse gallbladder thickening.         Interval History/Subjective: continues to C/O severe abdominal pain and states she is nauseated and unable to eat, although open jello is at bedside. Reports her pain is not well controlled. US demonstrates gallbladder thickening.      Review of Systems:     Comprehensive ROS negative except as mentioned below.    General ROS:  []   fevers  []   chills  []   fatigue  []   weakness  []   night sweats  []    body aches []  Other:  HEENT ROS:  []   trauma  []   headache  []   visual changes  []   double vision  []   blurred vision  []   tinnitus  []   vertigo  []   ear ache  []   drainage  []   bleeding gums  []   hoarseness  []   voice change  []   difficult/painful swallowing  []   stuffiness  []   rhinorrhea  []   sneezing  []   epistaxis []  Other:  Respiratory ROS:  []   cough  []   SOB  []   wheezing  []   changes in sputum production or quality  []   hemoptysis  []   pleurisy  []   snoring []  Other:  Cardiovascular ROS:  []  palpitations  []   pain  []   DOE  []   orthopnea  []   syncope  []   lower extremity edema []  Other:  Gastrointestinal ROS:  []   Dysphagia  [x]  ABD pain  [x]   nausea  []   vomiting  []   indigestion  []   diarrhea  []   constipation []  Other:  Genitourinary:  []   frequency  []   polyuria  []   nocturia  []   hesitancy  []   urgency  []   hematuria  []   incontinence []  Other:  Musculoskeletal ROS:  []   muscle or joint pain  []   stiffness  []  arthritis  []   gout  []   weakness  []   redness  []   swelling  []   instability []  Other:  Endocrine ROS:  []   heat/cold intolerance  []   sweating  []   excessive thirst or hunger []  Other:  Neurological ROS:  []   Seizures  []   numbness  []   tingling  []   fainting  []   burning  []   tremors []  Other:  Psych ROS:  []   Anxiety  []   depression  []   abnormal thoughts []  Other:  Dermatological ROS:  []   Rash  []   sores  []   lumps  []   skin changes  []   changes to hair or nails []  Other:    Past Medical History:        Diagnosis Date   . Anxiety    . Bipolar affective disorder ([place])    . Cervical fusion syndrome    . Chronic pain    . Depression    . Gestational diabetes mellitus    . Hepatitis C 08/02/2017   . 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 History of heroin abuse    . HPV in female    . Miscarriage    . 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 OCD (obsessive compulsive disorder)    . Paranoia ([place])    . Tobacco abuse        Past Surgical History:        Procedure Laterality Date   . CERVICAL SPINE SURGERY       C1-C2 fusion 2003   . CESAREAN SECTION     . ECTOPIC PREGNANCY SURGERY         Allergies:  Latex; Latex; Cephalexin; and Flagyl [metronidazole]    Past medical and surgical history reviewed. Any changes have been noted.     Scheduled and prn Medications:    Scheduled Meds:  . pantoprazole  40 mg Intravenous BID    And   . sodium chloride (PF)  10 mL Intravenous BID   . nicotine  1 patch Transdermal Daily   . sodium chloride flush  10 mL Intravenous 2 times per day   . enoxaparin  40 mg Subcutaneous Daily   . citalopram  20 mg Oral Daily   . melatonin ER  2 mg Oral Nightly     Continuous Infusions:  . sodium chloride 125 mL/hr at 08/03/17 0728     PRN Meds:.ketorolac, ondansetron, sodium chloride flush, hydrOXYzine, dicyclomine, traMADol **AND** cloNIDine, traZODone    PHYSICAL EXAM:  BP (!) 157/83   Pulse 79   Temp 98.4 F (36.9 C) (Oral)   Resp 16   Ht 5\' 5"  (1.651 m)   Wt 131 lb 3.2 oz (59.5 kg)   SpO2 98%   BMI 21.83 kg/m     No intake or output data in the 24 hours ending 08/03/17 1110    General: Alert and oriented. Resting in bed in mild distress secondary to pain/nausea.  HEENT: Normocephalic. Atraumatic. Pupils equal and reactive. EOM intact. Oral mucosa pink/moist/intact.  Neck: Supple. Symmetrical. Trachea midline.  Lungs: Clear to auscultation bilaterally. Respirations even and unlabored.  Chest: Exam unremarkable.  Cardiac: S1/S2 noted. Regular Rhythm and rate.  Abdomen/GI: Soft. Epigastric and RUQ tenderness noted.  Non-distended. BS+.  Extremities: PP+. Atraumatic. No redness/cyanosis/edema noted. Brisk cap refill.  Skin: Dry and intact. No lesions noted.   Neuro: Grossly intact. No focal deficits noted.     LABS:    Lab Results   Component Value Date  WBC 7.0 08/03/2017    HGB 11.7 (L) 08/03/2017    HCT 34.7 (L) 08/03/2017    MCV 88.8 08/03/2017    PLT 154 08/03/2017    LYMPHOPCT 20.2 08/03/2017    RBC 3.91 (L) 08/03/2017    MCH 29.8 08/03/2017    MCHC 33.6 08/03/2017    RDW 13.9  08/03/2017       Lab Results   Component Value Date    CREATININE 0.5 (L) 08/03/2017    BUN 9 08/03/2017    NA 141 08/03/2017    K 3.7 08/03/2017    CL 108 08/03/2017    CO2 17 (L) 08/03/2017       Lab Results   Component Value Date    MG 2.20 08/01/2017       Lab Results   Component Value Date    ALT 54 (H) 08/03/2017    AST 39 (H) 08/03/2017    ALKPHOS 93 08/03/2017    BILITOT 0.3 08/03/2017        No flowsheet data found.    Imaging:  XR CHEST PORTABLE   Final Result   No acute process.         CT ABDOMEN PELVIS W IV CONTRAST Additional Contrast? None   Final Result   1. Periportal edema.  This can be an incidental finding, but recommend   correlation with any clinical findings of hepatocellular disease   2. No gastrointestinal abnormality demonstrated   3. Small amount of free intraperitoneal fluid, and nonspecific omental edema         US ABDOMEN LIMITED    (Results Pending)       Assessment & Plan:      Sepsis-unknown origin as evidence by lactic acidosis, fever, and initial tachycardia  Antibiotics on hold per ID  CBC with diff in AM  Blood cultures negative thus far  Consider TEE if fever recurs  Lactic acid now WNL    Elevated LFTs with abdominal pain  GI consultation appreciated  +Hep C per panel  HIV negative  PRN toradol  Abdominal US demonstrates gallbladder thickening   1 dose of IV hydromorphone    Hyperchloremic metabolic acidosis  Stop IVF  BMP in AM    Polysubstance abuse  COWS protocol  Nicotine patch  May need inpatient rehab-will need to refer back to [facility] at [place]    Body mass index is 21.83 kg/m.    The patient was  informed of the results of any tests, a time was given to answer questions, a plan was proposed and they agreed with plan.    DVT prophylaxis: [x]  Lovenox  []  SQ Heparin  []  SCDs because of  []  warfarin/oral direct thrombin inhibitor []  Encourage ambulation    GI prophylaxis: [x]  PPI/H2blocker  []  not indicated    Probiotic if on abx: []  Yes []  No [x]  Not Indicated    Diet:  DIET CLEAR LIQUID;    Consults:  IP CONSULT TO INFECTIOUS DISEASES  CHEMICAL DEPENDENCY REFERRAL FOR SOCIAL SERVICE CONSULT  IP CONSULT TO GI  IP CONSULT TO INFECTIOUS DISEASES    Disposition:  [x]  Home  []  Home with home health []  Rehab []  Psych []  SNF  []  LTAC  []  Long term nursing home or group home []  Transfer to ICU  []  Transfer to PCU []  Other:    Code Status: Full Code    ELOS: 2-3 more days    [facility] Darrik Richman, APRN - CNP  08/03/17

## 2017-08-03 NOTE — Progress Notes (Signed)
Pt has had 3 unformed stools this AM.

## 2017-08-03 NOTE — Progress Notes (Signed)
INPATIENT CONSULTATION:    IDENTIFYING DATA/REASON FOR CONSULTATION   PATIENT:  [name] [name]  MRN:  [id number]  ADMIT DATE: 08/01/2017  TIME OF EVALUATION: 08/03/2017 6:56 AM  HOSPITAL STAY:   LOS: 2 days   CONSULTING PHYSICIAN: Agapito Games, MD   REASON FOR CONSULTATION:    Subjective:    Patient reports still having abd pain and nausea and hot flashes overnight.  No fevers    MEDICATIONS   SCHEDULED:    pantoprazole 40 mg BID   And     sodium chloride (PF) 10 mL BID   nicotine 1 patch Daily   sodium chloride flush 10 mL 2 times per day   enoxaparin 40 mg Daily   citalopram 20 mg Daily   melatonin ER 2 mg Nightly     FLUIDS/DRIPS:    . sodium chloride 125 mL/hr at 08/02/17 2346     PRNs:   ketorolac 15 mg Q6H PRN   ondansetron 4 mg Q6H PRN   sodium chloride flush 10 mL PRN   hydrOXYzine 50 mg Q8H PRN   dicyclomine 20 mg Q6H PRN   traMADol 50 mg PRN   And     cloNIDine 0.1 mg PRN   traZODone 50 mg Nightly PRN     ALLERGIES:    Allergies   Allergen Reactions   . Latex Itching, Rash and Other (See Comments)     Burning to the touch    . Latex Rash   . Cephalexin Shortness Of Breath   . Flagyl [Metronidazole] Shortness Of Breath         PHYSICAL EXAM     Vitals:    08/02/17 1150 08/02/17 1523 08/02/17 2010 08/02/17 2323   BP: 136/86 (!) 153/95 (!) 149/72 (!) 157/76   Pulse: 67 63 (!) 48 66   Resp: 16 16 16 16    Temp: 98.2 F (36.8 C) 98.5 F (36.9 C) 97.8 F (36.6 C) 98.4 F (36.9 C)   TempSrc: Oral Oral Oral Oral   SpO2: 98% 98% 98% 94%   Weight:       Height:           I/O last 3 completed shifts:  In: 64 [I.V.:1865]  Out: -     Physical Exam:  Gen: Resting in bed, NAD   HEENT: normocephalic, atraumatic. No scleral icterus.   CV: RRR no MRG   Pul: CTAB   Abd: Good bowel sounds throughout, soft, NT/ND, no masses, no HSM   Ext: No edema   Neuro: No asterixis   Skin: No jaundice, spider angiomas, palmer erythema     LABS AND IMAGING     Recent Results (from the past 24 hour(s))   CBC Auto  Differential    Collection Time: 08/02/17 12:36 PM   Result Value Ref Range    WBC 9.0 4.0 - 11.0 K/uL    RBC 3.86 (L) 4.00 - 5.20 M/uL    Hemoglobin 11.3 (L) 12.0 - 16.0 g/dL    Hematocrit 33.9 (L) 36.0 - 48.0 %    MCV 88.0 80.0 - 100.0 fL    MCH 29.3 26.0 - 34.0 pg    MCHC 33.4 31.0 - 36.0 g/dL    RDW 14.0 12.4 - 15.4 %    Platelets 133 (L) 135 - 450 K/uL    MPV 8.6 5.0 - 10.5 fL    Neutrophils % 79.3 %    Lymphocytes % 15.6 %    Monocytes % 4.2 %  Eosinophils % 0.4 %    Basophils % 0.5 %    Neutrophils # 7.2 1.7 - 7.7 K/uL    Lymphocytes # 1.4 1.0 - 5.1 K/uL    Monocytes # 0.4 0.0 - 1.3 K/uL    Eosinophils # 0.0 0.0 - 0.6 K/uL    Basophils # 0.0 0.0 - 0.2 K/uL   Hepatitis Panel, Acute    Collection Time: 08/02/17 12:37 PM   Result Value Ref Range    Hep A IgM Non-reactive Non-reactive    Hep B Core Ab, IgM Non-reactive Non-reactive    Hep B S Ag Interp Non-reactive Non-reactive    Hep C Ab Interp REACTIVE (A) Non-reactive   Comprehensive Metabolic Panel    Collection Time: 08/02/17 12:37 PM   Result Value Ref Range    Sodium 142 136 - 145 mmol/L    Potassium 3.9 3.5 - 5.1 mmol/L    Chloride 112 (H) 99 - 110 mmol/L    CO2 20 (L) 21 - 32 mmol/L    Anion Gap 10 3 - 16    Glucose 99 70 - 99 mg/dL    BUN 6 (L) 7 - 20 mg/dL    CREATININE <0.5 (L) 0.6 - 1.1 mg/dL    GFR Non-African American >60 >60    GFR African American >60 >60    Calcium 8.2 (L) 8.3 - 10.6 mg/dL    Total Protein 5.5 (L) 6.4 - 8.2 g/dL    Alb 3.0 (L) 3.4 - 5.0 g/dL    Albumin/Globulin Ratio 1.2 1.1 - 2.2    Total Bilirubin 0.3 0.0 - 1.0 mg/dL    Alkaline Phosphatase 98 40 - 129 U/L    ALT 66 (H) 10 - 40 U/L    AST 59 (H) 15 - 37 U/L    Globulin 2.5 g/dL   MRSA DNA Probe, Nasal    Collection Time: 08/02/17  3:00 PM   Result Value Ref Range    MRSA SCREEN RT-PCR      Organism Staph aureus MRSA (A)     MRSA SCREEN RT-PCR (A)      POSITIVE for  Normal Range: Not detected  CONTACT PRECAUTIONS INDICATED     CBC Auto Differential    Collection Time:  08/03/17  6:03 AM   Result Value Ref Range    WBC 7.0 4.0 - 11.0 K/uL    RBC 3.91 (L) 4.00 - 5.20 M/uL    Hemoglobin 11.7 (L) 12.0 - 16.0 g/dL    Hematocrit 34.7 (L) 36.0 - 48.0 %    MCV 88.8 80.0 - 100.0 fL    MCH 29.8 26.0 - 34.0 pg    MCHC 33.6 31.0 - 36.0 g/dL    RDW 13.9 12.4 - 15.4 %    Platelets 154 135 - 450 K/uL    MPV 8.7 5.0 - 10.5 fL    Neutrophils % 74.7 %    Lymphocytes % 20.2 %    Monocytes % 3.8 %    Eosinophils % 0.7 %    Basophils % 0.6 %    Neutrophils # 5.3 1.7 - 7.7 K/uL    Lymphocytes # 1.4 1.0 - 5.1 K/uL    Monocytes # 0.3 0.0 - 1.3 K/uL    Eosinophils # 0.0 0.0 - 0.6 K/uL    Basophils # 0.0 0.0 - 0.2 K/uL     Other Labs    Imaging  XR CHEST PORTABLE   Final Result   No acute process.  CT ABDOMEN PELVIS W IV CONTRAST Additional Contrast? None   Final Result   1. Periportal edema.  This can be an incidental finding, but recommend   correlation with any clinical findings of hepatocellular disease   2. No gastrointestinal abnormality demonstrated   3. Small amount of free intraperitoneal fluid, and nonspecific omental edema         US ABDOMEN LIMITED    (Results Pending)       Endoscopy      ASSESSMENT AND RECOMMENDATIONS   [name] [name] is a 36 y.o. female with a PMH of polysubstance abuse, bipolar disorder, OCD, tobacco abuse, chronic pain and prior c spine fusion surgery who presented 08/01/2017 with fevers, abdominal pain, nausea and AMS.  We have been consulted regarding elevated LFTs and abd pain.  Viral hep panel positive for Hep C antibodies.      1. Elevated LFTs:  slight improvement.  possibly due to Hep C.  Will need Hep C RNA and genotype checked in 12 weeks.  HIV neg.  RUQ [place] with diffuse GB wall thickening, no gallstones, no biliary ductal dilation.   Denies alcohol use.       2. Abdominal pain, nausea: possible due to acute hepatitis, or polysubstance abuse/withdrawal.  Urine tox screen was positive for cocaine, opiate, cannabinoid, benzo and amphetamine.  No prior  EGD.  Hx of frequent NSAID use  3. Polysubstance abuse  4. Positive Hep C antibodies.        RECOMMENDATIONS:    Supportive care  PPI BID and antiemetics as needed  Will need Hep C RNA and genotype checked in 12 weeks.  Will need to be instructed on ways to avoid spreading hep C infection (no needle/tooth brush/razor sharing, clean/cover bleeding wounds, barrier protection with sexual intercourse).   Encourage cessation of IVDU if wants to be considered candidate for Hep C eradication therapy if chronic hep c    If you have any questions or need any further information, please feel free to contact us [phone].  Thank you for allowing [place] to participate in the care of [place].    Darolyn Rua PA    Attending physician addendum:      I have personally seen and examined the patient, reviewed the patient's medical record and pertinent labs and clinical imaging.  I have personally staffed the case with Darolyn Rua PA.  I agree with her consultation note, exam findings, assessment and plans  as written above.  I have made appropriate modifications and edited her assessment and plan where needed to reflect my impression and plans for this patient.  My additions are in bold.    She continues to have abdominal pain.  No further fevers.  Has nausea but no vomiting.  No melena or hematemesis.  On exam, abdomen is soft and mildly tender to palpation.  She is A&Ox3.  Ultrasound showed periportal edema, diffuse gallbladder wall thickening without stones and negative murphy's sign and normal bile duct.  Labs showed bicarbonate 17 and otherwise normal renal panel.  LFTs show AST 39, ALT 54, alkaline phosphatase 93, bilirubin 0.3.  CBC shows white blood count 7 hemoglobin 11.7 and platelets 154.  HIV antibody is negative.  1.  Abdominal pain: May be from withdrawal.  Acute onset making an ulcer unlikely and being treated for an ulcer anyway with PPI.  CT negative for cause.  Ultrasound shows gallbladder  thickening and no ductal dilation.  Supportive care for now  2.  Hep C Ab: Will send genotype and PCR if she follows up in the office.      Thank you for allowing me to participate in this patient's care.  If there are any questions or concerns regarding this patient, or the plan we have set in place, please feel free to contact me at [phone].       York Spaniel, MD

## 2017-08-03 NOTE — Progress Notes (Signed)
Pt able to eat pizza and ice cream but then complains of 8/10 abdominal pain toradol and zofran given advised that possible low fat choices may help. Pt was also given oral bentyl, vistaril, clonidine, and tramadol.

## 2017-08-04 LAB — BASIC METABOLIC PANEL
Anion Gap: 10 (ref 3–16)
BUN: 7 mg/dL (ref 7–20)
CO2: 23 mmol/L (ref 21–32)
Calcium: 8.4 mg/dL (ref 8.3–10.6)
Chloride: 111 mmol/L — ABNORMAL HIGH (ref 99–110)
Creatinine: 0.5 mg/dL — ABNORMAL LOW (ref 0.6–1.1)
GFR African American: >60 (ref >60)
GFR Non-African American: >60 (ref >60)
Glucose: 127 mg/dL — ABNORMAL HIGH (ref 70–99)
Potassium: 3.5 mmol/L (ref 3.5–5.1)
Sodium: 144 mmol/L (ref 136–145)

## 2017-08-04 LAB — CBC WITH AUTO DIFFERENTIAL
Basophils %: 0.7 %
Basophils Absolute: 0 10*3/uL (ref 0.0–0.2)
Eosinophils %: 1.1 %
Eosinophils Absolute: 0.1 10*3/uL (ref 0.0–0.6)
Hematocrit: 34 % — ABNORMAL LOW (ref 36.0–48.0)
Hemoglobin: 11.3 g/dL — ABNORMAL LOW (ref 12.0–16.0)
Lymphocytes %: 31.7 %
Lymphocytes Absolute: 1.6 10*3/uL (ref 1.0–5.1)
MCH: 29.1 pg (ref 26.0–34.0)
MCHC: 33.3 g/dL (ref 31.0–36.0)
MCV: 87.3 fL (ref 80.0–100.0)
MPV: 8.3 fL (ref 5.0–10.5)
Monocytes %: 6.2 %
Monocytes Absolute: 0.3 10*3/uL (ref 0.0–1.3)
Neutrophils %: 60.3 %
Neutrophils Absolute: 3.1 10*3/uL (ref 1.7–7.7)
Platelets: 180 10*3/uL (ref 135–450)
RBC: 3.89 M/uL — ABNORMAL LOW (ref 4.00–5.20)
RDW: 13.8 % (ref 12.4–15.4)
WBC: 5.2 10*3/uL (ref 4.0–11.0)

## 2017-08-04 MED ORDER — POTASSIUM CHLORIDE ER 10 MEQ PO TBCR
10 MEQ | Freq: Once | ORAL | Status: AC
Start: 2017-08-04 — End: 2017-08-04
  Administered 2017-08-04: 17:00:00 30 meq via ORAL

## 2017-08-04 MED ORDER — DICYCLOMINE HCL 20 MG PO TABS
20 MG | ORAL_TABLET | Freq: Four times a day (QID) | ORAL | 0 refills | Status: DC | PRN
Start: 2017-08-04 — End: 2018-07-13

## 2017-08-04 MED ORDER — ONDANSETRON HCL 4 MG PO TABS
4 MG | ORAL_TABLET | Freq: Three times a day (TID) | ORAL | 0 refills | Status: AC | PRN
Start: 2017-08-04 — End: 2017-08-18

## 2017-08-04 MED ORDER — MUPIROCIN 2 % EX OINT
2 % | CUTANEOUS | 0 refills | Status: DC
Start: 2017-08-04 — End: 2017-08-04

## 2017-08-04 MED ORDER — DICYCLOMINE HCL 20 MG PO TABS
20 MG | ORAL_TABLET | Freq: Four times a day (QID) | ORAL | 3 refills | Status: DC | PRN
Start: 2017-08-04 — End: 2017-08-04

## 2017-08-04 MED ORDER — BUPRENORPHINE HCL 8 MG SL SUBL
8 MG | Freq: Once | SUBLINGUAL | Status: AC
Start: 2017-08-04 — End: 2017-08-04
  Administered 2017-08-04: 17:00:00 16 mg via SUBLINGUAL

## 2017-08-04 MED ORDER — MUPIROCIN 2 % EX OINT
2 % | CUTANEOUS | 0 refills | Status: AC
Start: 2017-08-04 — End: 2017-08-11

## 2017-08-04 MED FILL — POTASSIUM CHLORIDE ER 10 MEQ PO TBCR: 10 MEQ | ORAL | Qty: 3

## 2017-08-04 MED FILL — MELATONIN TR 1 MG PO TBCR: 1 MG | ORAL | Qty: 2

## 2017-08-04 MED FILL — PROTONIX 40 MG IV SOLR: 40 MG | INTRAVENOUS | Qty: 40

## 2017-08-04 MED FILL — SODIUM CHLORIDE 0.9 % IJ SOLN: 0.9 % | INTRAMUSCULAR | Qty: 10

## 2017-08-04 MED FILL — CITALOPRAM HYDROBROMIDE 20 MG PO TABS: 20 MG | ORAL | Qty: 1

## 2017-08-04 MED FILL — NICOTINE 14 MG/24HR TD PT24: 14 MG/24HR | TRANSDERMAL | Qty: 1

## 2017-08-04 MED FILL — LOVENOX 40 MG/0.4ML SC SOLN: 40 MG/0.4ML | SUBCUTANEOUS | Qty: 0.4

## 2017-08-04 MED FILL — KETOROLAC TROMETHAMINE 15 MG/ML IJ SOLN: 15 MG/ML | INTRAMUSCULAR | Qty: 1

## 2017-08-04 MED FILL — NORMAL SALINE FLUSH 0.9 % IV SOLN: 0.9 % | INTRAVENOUS | Qty: 10

## 2017-08-04 MED FILL — BUPRENORPHINE HCL 8 MG SL SUBL: 8 MG | SUBLINGUAL | Qty: 2

## 2017-08-04 NOTE — Discharge Instructions (Signed)
As tolerated

## 2017-08-04 NOTE — Progress Notes (Signed)
Patient A&O in bed. Patient tolerating PO intake. Patient denies pain, nausea, and vomiting at this time. Call light within reach. Bed in lowest position and wheels locked. 2/4 bed rails up. Able to make needs known. Fall precautions in place. Will continue to monitor and assess.

## 2017-08-04 NOTE — Progress Notes (Signed)
Discharge instructions read to pt. Questions answered. Pt has medication from pharmacy. Cab was called. IV was removed.   Electronically signed by Nell Range, RN on 08/04/2017 at 5:10 PM

## 2017-08-04 NOTE — Discharge Instructions (Signed)
.   Good nutrition is important when healing from an illness, injury, or surgery.  Follow any nutrition recommendations given to you during your hospital stay.   . If you were given an oral nutrition supplement while in the hospital, continue to take this supplement at home.  You can take it with meals, in-between meals, and/or before bedtime. These supplements can be purchased at most local grocery stores, pharmacies, and chain super-stores.   . If you have any questions about your diet or nutrition, call the hospital and ask for the dietitian.    As tolerated

## 2017-08-04 NOTE — Progress Notes (Signed)
INPATIENT CONSULTATION:    IDENTIFYING DATA/REASON FOR CONSULTATION   PATIENT:  [name] [name]  MRN:  [id number]  ADMIT DATE: 08/01/2017  TIME OF EVALUATION: 08/04/2017 8:26 AM  HOSPITAL STAY:   LOS: 3 days   CONSULTING PHYSICIAN: Agapito Games, MD   REASON FOR CONSULTATION:    Subjective:    Patient reports still having abd pain, nausea and sweats.  No documented fevers.  Tolerating regular food.      MEDICATIONS   SCHEDULED:      mupirocin  BID   pantoprazole 40 mg BID   And     sodium chloride (PF) 10 mL BID   nicotine 1 patch Daily   sodium chloride flush 10 mL 2 times per day   enoxaparin 40 mg Daily   citalopram 20 mg Daily   melatonin ER 2 mg Nightly     FLUIDS/DRIPS:      PRNs:     ketorolac 15 mg Q6H PRN   ondansetron 4 mg Q6H PRN   sodium chloride flush 10 mL PRN   hydrOXYzine 50 mg Q8H PRN   dicyclomine 20 mg Q6H PRN   traMADol 50 mg PRN   And     cloNIDine 0.1 mg PRN   traZODone 50 mg Nightly PRN     ALLERGIES:    Allergies   Allergen Reactions   . Latex Itching, Rash and Other (See Comments)     Burning to the touch    . Latex Rash   . Cephalexin Shortness Of Breath   . Flagyl [Metronidazole] Shortness Of Breath         PHYSICAL EXAM     Vitals:    08/03/17 0908 08/03/17 1250 08/03/17 1651 08/03/17 1945   BP:  (!) 146/85 (!) 164/96 137/78   Pulse:  59 69 50   Resp:  16 16 16    Temp:  98.2 F (36.8 C) 99.6 F (37.6 C) 98.9 F (37.2 C)   TempSrc:  Oral Oral    SpO2: 98% 98% 96% 96%   Weight:       Height:           No intake/output data recorded.    Physical Exam:  Gen: Resting in bed, NAD   HEENT: normocephalic, atraumatic. No scleral icterus.   CV: RRR no MRG   Pul: CTAB   Abd: Good bowel sounds throughout, soft, NT/ND, no masses, no HSM   Ext: No edema   Neuro: No asterixis   Skin: No jaundice, spider angiomas, palmer erythema     LABS AND IMAGING     Recent Results (from the past 24 hour(s))   CBC Auto Differential    Collection Time: 08/04/17  5:24 AM   Result Value Ref Range    WBC 5.2  4.0 - 11.0 K/uL    RBC 3.89 (L) 4.00 - 5.20 M/uL    Hemoglobin 11.3 (L) 12.0 - 16.0 g/dL    Hematocrit 34.0 (L) 36.0 - 48.0 %    MCV 87.3 80.0 - 100.0 fL    MCH 29.1 26.0 - 34.0 pg    MCHC 33.3 31.0 - 36.0 g/dL    RDW 13.8 12.4 - 15.4 %    Platelets 180 135 - 450 K/uL    MPV 8.3 5.0 - 10.5 fL    Neutrophils % 60.3 %    Lymphocytes % 31.7 %    Monocytes % 6.2 %    Eosinophils % 1.1 %  Basophils % 0.7 %    Neutrophils # 3.1 1.7 - 7.7 K/uL    Lymphocytes # 1.6 1.0 - 5.1 K/uL    Monocytes # 0.3 0.0 - 1.3 K/uL    Eosinophils # 0.1 0.0 - 0.6 K/uL    Basophils # 0.0 0.0 - 0.2 K/uL   Basic Metabolic Panel    Collection Time: 08/04/17  5:25 AM   Result Value Ref Range    Sodium 144 136 - 145 mmol/L    Potassium 3.5 3.5 - 5.1 mmol/L    Chloride 111 (H) 99 - 110 mmol/L    CO2 23 21 - 32 mmol/L    Anion Gap 10 3 - 16    Glucose 127 (H) 70 - 99 mg/dL    BUN 7 7 - 20 mg/dL    CREATININE 0.5 (L) 0.6 - 1.1 mg/dL    GFR Non-African American >60 >60    GFR African American >60 >60    Calcium 8.4 8.3 - 10.6 mg/dL     Other Labs    Imaging  US ABDOMEN LIMITED   Final Result   Hepatic periportal edema, which can be seen in the setting of hepatitis.      Significant diffuse gallbladder wall thickening without cholelithiasis.   Findings may be reactive.  No sonographic Murphy's sign.  No common bile duct   dilation.         XR CHEST PORTABLE   Final Result   No acute process.         CT ABDOMEN PELVIS W IV CONTRAST Additional Contrast? None   Final Result   1. Periportal edema.  This can be an incidental finding, but recommend   correlation with any clinical findings of hepatocellular disease   2. No gastrointestinal abnormality demonstrated   3. Small amount of free intraperitoneal fluid, and nonspecific omental edema             Endoscopy      ASSESSMENT AND RECOMMENDATIONS   [name] [name] is a 36 y.o. female with a PMH of polysubstance abuse, bipolar disorder, OCD, tobacco abuse, chronic pain and prior c spine fusion surgery  who presented 08/01/2017 with fevers, abdominal pain, nausea and AMS.  We have been consulted regarding elevated LFTs and abd pain.  Viral hep panel positive for Hep C antibodies.      1. Elevated LFTs:  slight improvement.  possibly due to Hep C.  Will need Hep C RNA and genotype checked in 12 weeks.  HIV neg.  RUQ [place] with diffuse GB wall thickening, no gallstones, no biliary ductal dilation.   Denies alcohol use.       2. Abdominal pain, nausea: possible due to acute hepatitis, or polysubstance abuse/withdrawal.  Urine tox screen was positive for cocaine, opiate, cannabinoid, benzo and amphetamine.  No prior EGD.  Hx of frequent NSAID use  3. Polysubstance abuse  4. Positive Hep C antibodies.        RECOMMENDATIONS:    Will add bentyl 20 mg QID for abd pain  continue PPI BID and antiemetics as needed  Will need Hep C RNA and genotype checked in 12 weeks.  No further GI work up planned at this time    If you have any questions or need any further information, please feel free to contact us [phone].  Thank you for allowing [place] to participate in the care of [place].    Darolyn Rua PA    Attending  physician addendum:      I have personally seen and examined the patient, reviewed the patient's medical record and pertinent labs and clinical imaging.  I have personally staffed the case with Darolyn Rua PA.  I agree with her consultation note, exam findings, assessment and plans  as written above.  I have made appropriate modifications and edited her assessment and plan where needed to reflect my impression and plans for this patient.  My additions are in bold.    She is tolerating her food.  She has no further vomiting.  She does have abdominal pain and diaphoresis.  No further fevers.  On exam her abdomen is soft and nontender.  She is alert and oriented.  Labs show a normal white blood count. AST 39 and ALT 54.  1.  Hep C.  Needs to stop using drugs and follow up in the office and if she can do  this then we can cure the hepatitis C.  2.  Abdominal pain: again, needs to stop using drugs.  Not sure how much of this is heroin withdrawal, visceral hypersensitivity from narcotic abuse.  Treating as an ulcer.  Cancer highly unlikely at her age.  CT and ultrasound unrevealing.  Supportive care for now.      Thank you for allowing me to participate in this patient's care.  If there are any questions or concerns regarding this patient, or the plan we have set in place, please feel free to contact me at [phone].       York Spaniel, MD

## 2017-08-04 NOTE — Discharge Summary (Signed)
Hospital Medicine Discharge Summary    Patient ID: [name] [name]      Patient's PCP: Jena Gauss [facility]    Admit Date: 08/01/2017     Discharge Date: 08/04/2017    Admitting Physician: No admitting provider for patient encounter.     PCP to follow up: will need to follow up with [facility] for continued addiction treatment .  Will need additional hep C serologies in 12 weeks.     Discharge Physician: Tilda Burrow Guenther [place], APRN - CNP     Discharge Diagnoses:    Active Hospital Problems    Diagnosis Date Noted   . Hyperchloremic metabolic acidosis [Z61.0] 08/02/2017   . Elevated LFTs [R94.5] 08/02/2017   . Polysubstance abuse ([place]) [F19.10] 08/02/2017   . Abdominal pain [R10.9] 08/02/2017   . Sepsis ([place]) [A41.9] 08/01/2017       The patient was seen and examined on day of discharge and this discharge summary is in conjunction with any daily progress note from day of discharge.    Hospital Course:  The patient is a 36 y.o.black female with a medical history that includes polysubstance abuse including heroin for which she has used IV in the distant past and bipolar affective disorderwho returned to [facility]  after being admitted with fever of unknown origin noted to be 103.5 F and altered mental status for which she left AMA. Patient had admitted that she just recently has relapsed over the weekend and has been snorting heroin. Patient has been in active outpatient rehab at [facility] and had been on outpatient Suboxone therapy prior to her relapse.  She reported epigastric and RUQ pain that is sharp in nature radiating into her shoulders associated with nausea.  She also reported some vaginal discharge and was recently seen at the [facility] clinic.  Initial blood cultures are negative. CXR did not demonstrate any acute process.  UA was negative.  CT of the abdomen and pelvis demonstrated periportal edema and a small amount of free intraperitoneal fluid and omental fluid.  LFTs are elevated.  Initial lactic acid was 2.3 in the setting of initital fever and tachycardia, thereby meeting sepsis criteria.  Infectious disease was consulted.  She received IV vancomycin and zosyn in the ER.  Antibiotics on hold per ID. Cultures were negative and no further antibiotics were administered.  UDS was positive for for amphetamine, benzos, THC, opiates, and cocaine. Abdominal ultrasound 08/03/17 per GI demonstrates diffuse gallbladder thickening. Hepatitis panel was + for Hepatitis C. Bentyl was started per GI. GI did not plan any further interventions at this time and she was cleared for discharge. She will be given one dose of Subutex prior to discharge (had dilaudid and will go into precipitous withdrawal if given Suboxone) and was instructed to return to [facility] as soon as possible to continue addiction treatment. 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       Exam:     BP (!) 162/87   Pulse 51   Temp 98 F (36.7 C) (Oral)   Resp 17   Ht 5\' 5"  (1.651 m)   Wt 131 lb 3.2 oz (59.5 kg)   SpO2 98%   BMI 21.83 kg/m     General: Alert and oriented.   HEENT: Normocephalic. Atraumatic. Pupils equal and reactive. EOM intact. Oral mucosa pink/moist/intact.  Neck: Supple. Symmetrical. Trachea midline.  Lungs: Clear to auscultation bilaterally. Respirations even and unlabored.  Chest: Exam unremarkable.  Cardiac:  S1/S2 noted. Regular Rhythm and rate.  Abdomen/GI: Soft. Epigastric and RUQ tenderness noted.  Non-distended. BS+.  Extremities: PP+. Atraumatic. No redness/cyanosis/edema noted. Brisk cap refill.  Skin: Dry and intact. No lesions noted.   Neuro: Grossly intact. No focal deficits noted.     Consults:     IP CONSULT TO INFECTIOUS DISEASES  CHEMICAL DEPENDENCY REFERRAL FOR SOCIAL SERVICE CONSULT  IP CONSULT TO GI  IP CONSULT TO INFECTIOUS DISEASES  IP CONSULT TO SOCIAL WORK    Significant Diagnostic Studies:     Narrative   EXAMINATION:   CT OF THE ABDOMEN AND PELVIS WITH  CONTRAST 08/01/2017 1:08 pm      TECHNIQUE:   CT of the abdomen and pelvis was performed with the administration of   intravenous contrast. Multiplanar reformatted images are provided for review.   Dose modulation, iterative reconstruction, and/or weight based adjustment of   the mA/kV was utilized to reduce the radiation dose to as low as reasonably   achievable.      COMPARISON:   09/13/2008      HISTORY:   ORDERING SYSTEM PROVIDED HISTORY: nausea, mild epigastric pain, sepstic   TECHNOLOGIST PROVIDED HISTORY:   If patient is on cardiac monitor and/or pulse ox, they may be taken off   cardiac monitor and pulse ox, left on O2 if currently on. All monitors   reattached when patient returns to room.   Additional Contrast?->None   Ordering Physician Provided Reason for Exam: nausea, mild epigastric pain,   septic. reports that she just signed out AMA from inpatient unit for   "infection. hx ectopic pregnancy. no ca   Acuity: Acute   Type of Exam: Initial      FINDINGS:   Lower Chest: Lung bases clear      Organs: Periportal low attenuation. Remaining solid abdominal organs   unremarkable. Gallbladder contracted      GI/Bowel: No gastrointestinal abnormality demonstrated. Appendix normal      Pelvis: Reproductive organs within normal limits. Urinary bladder   unremarkable. Trace fluid      Peritoneum/Retroperitoneum: Trace ascites in Morison's space. Nonspecific   edema of the omental fat. No pneumoperitoneum. Aorta normal in caliber      Bones/Soft Tissues: No acute bony abnormality         Impression   1. Periportal edema. This can be an incidental finding, but recommend   correlation with any clinical findings of hepatocellular disease   2. No gastrointestinal abnormality demonstrated   3. Small amount of free intraperitoneal fluid, and nonspecific omental edema     Narrative   EXAMINATION:   RIGHT UPPER QUADRANT ULTRASOUND      08/03/2017 10:55 am      COMPARISON:   CT abdomen and pelvis  08/01/2017.      HISTORY:   ORDERING SYSTEM PROVIDED HISTORY: RUQ ultrasound for abdominal pain and fever   to 103   TECHNOLOGIST PROVIDED HISTORY:   Reason for exam:->RUQ ultrasound for abdominal pain and fever to 103      FINDINGS:   LIVER: Findings compatible with periportal edema. No focal liver lesion.      BILIARY SYSTEM: Substantial diffuse gallbladder wall thickening. No   evidence of gallbladder stones/sludge. No sonographic Murphy's sign.      Common bile duct is within normal limits measuring 4-5 mm.      RIGHT KIDNEY: The right kidney is grossly unremarkable without evidence of   hydronephrosis.      PANCREAS: Visualized portions of  the pancreas are unremarkable.      OTHER: Small volume perihepatic fluid.         Impression   Hepatic periportal edema, which can be seen in the setting of hepatitis.      Significant diffuse gallbladder wall thickening without cholelithiasis.   Findings may be reactive. No sonographic Murphy's sign. No common bile duct   dilation.      08/02/2017 4:15 PM - [facility] Incoming Lab Results From Soft (Epic Adt)     Component Results     Component Collected Lab   Culture, Blood 2 08/01/2017 2:38 PM [facility]   No Growth to date. Any change in status will be called.      08/02/2017 5:15 AM - [facility] Incoming Lab Results From Soft (Epic Adt)     Component Results     Component Collected Lab   Blood Culture, Routine 08/01/2017 12:43 AM [facility]   No Growth to date. Any change in status will be called.    Testing Performed By     Discharge Condition: stable    Disposition:  [x]  Home  []  Home with home health []  Rehab []  Psych []  SNF  []  LTAC  []  Long term nursing home or group home []  Transfer to ICU  []  Transfer to PCU []  Other:    Discharge Instructions/Follow-up: follow up with PCP, [facility] as soon as possible, and GI in 12 weeks for additional testing.    Code Status:  Full Code     Activity: activity as tolerated    Diet: regular  diet    Labs: For convenience and continuity at follow-up the following most recent labs are provided:      CBC:    Lab Results   Component Value Date    WBC 5.2 08/04/2017    HGB 11.3 08/04/2017    HCT 34.0 08/04/2017    PLT 180 08/04/2017       Renal:    Lab Results   Component Value Date    NA 144 08/04/2017    K 3.5 08/04/2017    K 3.2 08/01/2017    CL 111 08/04/2017    CO2 23 08/04/2017    BUN 7 08/04/2017    CREATININE 0.5 08/04/2017    CALCIUM 8.4 08/04/2017       Discharge Medications:     Current Discharge Medication List           Details   dicyclomine (BENTYL) 20 MG tablet Take 1 tablet by mouth every 6 hours as needed (Abdominal Cramping)  Qty: 56 tablet, Refills: 0      mupirocin (BACTROBAN) 2 % ointment Apply to the inside of your nostrils twice a day for 4 days  Qty: 1 Tube, Refills: 0      ondansetron (ZOFRAN) 4 MG tablet Take 1 tablet by mouth every 8 hours as needed for Nausea or Vomiting  Qty: 42 tablet, Refills: 0              Details   buprenorphine-naloxone (SUBOXONE) 8-2 MG FILM SL film Place 2 Film under the tongue daily..      cloNIDine (CATAPRES) 0.1 MG tablet Take 0.1 mg by mouth every 12 hours as needed for High Blood Pressure (restlessness / withdrawal)      hydrOXYzine (ATARAX) 25 MG tablet Take 25 mg by mouth 4 times daily as needed for Itching or Anxiety      citalopram (CELEXA) 20  MG tablet Take 20 mg by mouth daily              Time Spent on discharge is more than 30 minutes in the examination, evaluation, counseling and review of medications and discharge plan.    SignedTilda Burrow Mehar Sagen, APRN - CNP   08/04/2017    Thank you [facility] for the opportunity to be involved in this patient's care. If you have any questions or concerns please feel free to contact me at [phone].

## 2017-08-04 NOTE — Progress Notes (Signed)
CLINICAL PHARMACY NOTE: MEDS TO [place] [facility] Patient?: No  Total # of Prescriptions Filled: 3   The following medications were delivered to the patient:   Mupirocin   Ondansetron   Dicyclomine  Total # of Interventions Completed: 0  Time Spent (min): 15    Additional Documentation:  None      Rudi Coco   PharmD Candidate 2019

## 2017-08-04 NOTE — Plan of Care (Signed)
Problem: Pain:  Goal: Pain level will decrease  Pain level will decrease   Outcome: Ongoing  Decreasing pain levels is ongoing during this shift   Goal: Control of acute pain  Control of acute pain   Outcome: Ongoing  Acute pain control is ongoing during this shift. Pt still complains of abdominal pain     Problem: Falls - Risk of:  Goal: Will remain free from falls  Will remain free from falls   Outcome: Ongoing  Pt remains free from falls during this shift. Fall risk assessment completed. Fall precautions in place. Bed in lowest position, wheels locked, call light within reach, and non skid footwear on. Walkway free of clutter. Pt alert and oriented and able to make needs known. Pt educated to use call light when needing to get up, and pt utilizes call light to make needs known. Will continue to monitor.     Goal: Absence of physical injury  Absence of physical injury   Outcome: Ongoing  Pt absent from physical injury during this shift

## 2017-08-04 NOTE — Care Coordination-Inpatient (Signed)
Noted d/c order. Patient states she is returning to her dad's home temporarily. Reports she is active with [facility] who are working to get her into the [employer] program to assist her with housing. Patient reports she is active with [facility] and has an appt with them tomorrow at 12:15 and already has transportation arranged through [employer]. Cab voucher provided for transport to home.    Electronically signed by [place] Bells on 08/04/2017 at 4:32 PM   [phone]

## 2017-08-04 NOTE — Discharge Instructions (Signed)
Do not take anything not prescribed to you    You may obtain Naloxone from a Pharmacist without a prescription (go to the counter to discuss) and we highly recommend you obtain this if your relapse continues    Go to [facility] as soon as possible for treatment recommendations    Will need to follow up regarding your hepatitis C in 12 weeks

## 2017-08-04 NOTE — Plan of Care (Signed)
Problem: Pain:  Goal: Control of acute pain  Control of acute pain   Outcome: Ongoing  Pain/discomfort being managed with PRN analgesics per MD orders. Pt able to express presence and absence of pain and rate pain appropriately using numerical scale.      Problem: Falls - Risk of:  Goal: Will remain free from falls  Will remain free from falls   Outcome: Ongoing  Fall risk assessment completed. Fall precautions in place. Call light within reach. Pt educated on calling for assistance before getting up. Walkway free of clutter. Will continue to monitor.     Goal: Absence of physical injury  Absence of physical injury   Outcome: Ongoing  Pt is free of injury. No injury noted. Fall precautions in place. Call light within reach. Will monitor.

## 2017-08-04 NOTE — Progress Notes (Signed)
Patient is alert & oriented x4,pt UAL , 2/4 bed rails up, bed in lowest position, fall precautions in place, call light within reach. Morning medication and assessment complete. Will cont to monitor and reassess.  Electronically signed by Nell Range, RN on 08/04/2017 at 10:41 AM

## 2017-08-06 LAB — CULTURE BLOOD #1
Blood Culture, Routine: NO GROWTH
Blood Culture, Routine: NO GROWTH
Blood Culture, Routine: NO GROWTH

## 2017-08-06 LAB — CULTURE, BLOOD 2: Culture, Blood 2: NO GROWTH

## 2018-05-30 ENCOUNTER — Inpatient Hospital Stay
Admit: 2018-05-30 | Discharge: 2018-05-30 | Disposition: A | Discharge status: Home / Self Care | Payer: PRIVATE HEALTH INSURANCE

## 2018-05-30 ENCOUNTER — Emergency Department: Admit: 2018-05-30 | Payer: PRIVATE HEALTH INSURANCE | Primary: Family

## 2018-05-30 DIAGNOSIS — F1190 Opioid use, unspecified, uncomplicated: Secondary | ICD-10-CM

## 2018-05-30 MED ORDER — naloxone (NARCAN) injection 0.2 mg
0.4 | Freq: Once | INTRAMUSCULAR | Status: AC
Start: 2018-05-30 — End: 2018-05-30
  Administered 2018-05-30: 14:00:00 0.2 mg via INTRAMUSCULAR

## 2018-05-30 MED ORDER — ondansetron (ZOFRAN) tablet 4 mg
4 | Freq: Once | ORAL | Status: AC
Start: 2018-05-30 — End: 2018-05-30
  Administered 2018-05-30: 16:00:00 4 mg via ORAL

## 2018-05-30 MED FILL — NALOXONE 0.4 MG/ML INJECTION SOLUTION: 0.4 0.4 mg/mL | INTRAMUSCULAR | Qty: 1

## 2018-05-30 MED FILL — ONDANSETRON HCL 4 MG TABLET: 4 4 MG | ORAL | Qty: 1
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
## 2018-05-30 NOTE — Unmapped (Signed)
Pt to xray via wheelchair.

## 2018-05-30 NOTE — ED Notes (Addendum)
Rounding  Patient requested transportation home via [facility]. Confirmation number is [id number]. No further intervention.   Patient given cab voucher due to patient approximately 3 hours with no results from [employer].

## 2018-05-30 NOTE — Unmapped (Signed)
Please come back to the emergency department if you have any other concerns. I am providing you the number for our endocrinologist so you can establish care with them.
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
## 2018-07-13 ENCOUNTER — Emergency Department: Admit: 2018-07-13 | Payer: PRIVATE HEALTH INSURANCE | Primary: Family Medicine

## 2018-07-13 ENCOUNTER — Inpatient Hospital Stay
Admit: 2018-07-13 | Discharge: 2018-07-13 | Disposition: A | Discharge status: Other Acute Inpatient Hospital | Payer: PRIVATE HEALTH INSURANCE | Attending: Emergency Medicine

## 2018-07-13 ENCOUNTER — Inpatient Hospital Stay: Admit: 2018-07-14 | Payer: PRIVATE HEALTH INSURANCE | Primary: Family Medicine

## 2018-07-13 ENCOUNTER — Inpatient Hospital Stay: Payer: PRIVATE HEALTH INSURANCE | Primary: Family Medicine

## 2018-07-13 ENCOUNTER — Inpatient Hospital Stay
Admit: 2018-07-13 | Discharge: 2018-07-18 | Discharge status: Against Medical Advice | Payer: PRIVATE HEALTH INSURANCE | Attending: Internal Medicine | Admitting: Internal Medicine

## 2018-07-13 DIAGNOSIS — G061 Intraspinal abscess and granuloma: Secondary | ICD-10-CM

## 2018-07-13 DIAGNOSIS — L02413 Cutaneous abscess of right upper limb: Secondary | ICD-10-CM

## 2018-07-13 LAB — COMPREHENSIVE METABOLIC PANEL W/ REFLEX TO MG FOR LOW K
ALT: 15 U/L (ref 10–40)
AST: 38 U/L — ABNORMAL HIGH (ref 15–37)
Albumin/Globulin Ratio: 0.9 — ABNORMAL LOW (ref 1.1–2.2)
Albumin: 3.6 g/dL (ref 3.4–5.0)
Alkaline Phosphatase: 79 U/L (ref 40–129)
Anion Gap: 10 (ref 3–16)
BUN: 11 mg/dL (ref 7–20)
CO2: 29 mmol/L (ref 21–32)
Calcium: 9.3 mg/dL (ref 8.3–10.6)
Chloride: 100 mmol/L (ref 99–110)
Creatinine: <0.5 mg/dL — ABNORMAL LOW (ref 0.6–1.1)
GFR African American: >60 (ref >60)
GFR Non-African American: >60 (ref >60)
Globulin: 3.9 g/dL
Glucose: 113 mg/dL — ABNORMAL HIGH (ref 70–99)
Potassium reflex Magnesium: 3.7 mmol/L (ref 3.5–5.1)
Sodium: 139 mmol/L (ref 136–145)
Total Bilirubin: 0.4 mg/dL (ref 0.0–1.0)
Total Protein: 7.5 g/dL (ref 6.4–8.2)

## 2018-07-13 LAB — CBC WITH AUTO DIFFERENTIAL
Basophils %: 0.9 %
Basophils Absolute: 0.1 10*3/uL (ref 0.0–0.2)
Eosinophils %: 0.1 %
Eosinophils Absolute: 0 10*3/uL (ref 0.0–0.6)
Hematocrit: 31.9 % — ABNORMAL LOW (ref 36.0–48.0)
Hemoglobin: 10.5 g/dL — ABNORMAL LOW (ref 12.0–16.0)
Lymphocytes %: 17.5 %
Lymphocytes Absolute: 1.3 10*3/uL (ref 1.0–5.1)
MCH: 27.5 pg (ref 26.0–34.0)
MCHC: 32.9 g/dL (ref 31.0–36.0)
MCV: 83.7 fL (ref 80.0–100.0)
MPV: 8.7 fL (ref 5.0–10.5)
Monocytes %: 9.8 %
Monocytes Absolute: 0.7 10*3/uL (ref 0.0–1.3)
Neutrophils %: 71.7 %
Neutrophils Absolute: 5.5 10*3/uL (ref 1.7–7.7)
Platelets: 122 10*3/uL — ABNORMAL LOW (ref 135–450)
RBC: 3.82 M/uL — ABNORMAL LOW (ref 4.00–5.20)
RDW: 14.9 % (ref 12.4–15.4)
WBC: 7.7 10*3/uL (ref 4.0–11.0)

## 2018-07-13 LAB — URINALYSIS WITH REFLEX TO CULTURE
Bilirubin Urine: NEGATIVE
Blood, Urine: NEGATIVE
Glucose, Ur: NEGATIVE mg/dL
Ketones, Urine: NEGATIVE mg/dL
Nitrite, Urine: NEGATIVE
Protein, UA: NEGATIVE mg/dL
Specific Gravity, UA: 1.01 (ref 1.005–1.030)
Urobilinogen, Urine: 1 E.U./dL (ref <2.0)
pH, UA: 7 (ref 5.0–8.0)

## 2018-07-13 LAB — MICROSCOPIC URINALYSIS

## 2018-07-13 LAB — LACTATE, SEPSIS: Lactic Acid, Sepsis: 0.8 mmol/L (ref 0.4–1.9)

## 2018-07-13 LAB — SEDIMENTATION RATE: Sed Rate, Automated: 75 mm/h — ABNORMAL HIGH (ref 0–20)

## 2018-07-13 LAB — PROTIME-INR
INR: 1.28 — ABNORMAL HIGH (ref 0.86–1.14)
Protime: 14.5 s — ABNORMAL HIGH (ref 9.8–13.0)

## 2018-07-13 MED ORDER — SODIUM CHLORIDE 0.9 % IV SOLN
0.9 % | Freq: Once | INTRAVENOUS | Status: AC
Start: 2018-07-13 — End: 2018-07-13
  Administered 2018-07-13: 18:00:00 1752 mL/kg via INTRAVENOUS

## 2018-07-13 MED ORDER — NORMAL SALINE FLUSH 0.9 % IV SOLN
0.9 | INTRAVENOUS | Status: DC | PRN
Start: 2018-07-13 — End: 2018-07-13

## 2018-07-13 MED ORDER — NORMAL SALINE FLUSH 0.9 % IV SOLN
0.9 % | Freq: Two times a day (BID) | INTRAVENOUS | Status: DC
Start: 2018-07-13 — End: 2018-07-13

## 2018-07-13 MED ORDER — PIPERACILLIN SOD-TAZOBACTAM SO 3.375 (3-0.375) G IV SOLR
3.375 | Freq: Once | INTRAVENOUS | Status: AC
Start: 2018-07-13 — End: 2018-07-13
  Administered 2018-07-13: 19:00:00 3.375 g via INTRAVENOUS

## 2018-07-13 MED ORDER — VANCOMYCIN HCL 1 G IV SOLR
1 g | Freq: Once | INTRAVENOUS | Status: AC
Start: 2018-07-13 — End: 2018-07-13
  Administered 2018-07-13: 19:00:00 1000 mg/kg via INTRAVENOUS

## 2018-07-13 MED FILL — PIPERACILLIN SOD-TAZOBACTAM SO 3.375 (3-0.375) G IV SOLR: 3.375 (3-0.375) g | INTRAVENOUS | Qty: 3.38

## 2018-07-13 MED FILL — VANCOMYCIN HCL 1 G IV SOLR: 1 g | INTRAVENOUS | Qty: 1000

## 2018-07-13 NOTE — ED Notes (Addendum)
Amber called from access center (and spoke to [employer]) and states that pt being reassigned another bed at [facility].    Once new bed is assigned then ambulance arrangements will be made     Laretta Bolster, RN  07/13/18 [place], [place]  07/13/18 1704

## 2018-07-13 NOTE — ED Notes (Signed)
Portable CXR being done     Laretta Bolster, RN  07/13/18 1335

## 2018-07-13 NOTE — ED Notes (Signed)
2nd set of blood cultures drawn from topof right hand below index finger.    2 mins chlorhexadine prep.  tol well     Laretta Bolster, RN  07/13/18 1448

## 2018-07-13 NOTE — Progress Notes (Signed)
Ordered MRI of cervical-thoracic-lumbar spine. Pt did not tolerate, only able to obtain MRI lumbar spine.

## 2018-07-13 NOTE — ED Notes (Signed)
Access center called to state that previous [facility] transport is being canceled due to truck breaking down. Access center will call another company to arrange transport for this pt. Juliann Pulse RN made aware.      Burna Sis, RCP  07/13/18 [phone]

## 2018-07-13 NOTE — Plan of Care (Signed)
Problem: Pain:  Goal: Pain level will decrease  Description  Pain level will decrease  Outcome: Ongoing  Goal: Control of acute pain  Description  Control of acute pain  Outcome: Ongoing  Goal: Control of chronic pain  Description  Control of chronic pain  Outcome: Ongoing     Problem: Falls - Risk of:  Goal: Will remain free from falls  Description  Will remain free from falls  Outcome: Ongoing  Goal: Absence of physical injury  Description  Absence of physical injury  Outcome: Ongoing     Problem: INFECTION  Goal: Absence of infection signs and symptoms  Outcome: Ongoing

## 2018-07-13 NOTE — Progress Notes (Signed)
Pt to MRI via cart.

## 2018-07-13 NOTE — ED Notes (Signed)
Access center called and Dr. Lucila Maine (hospitalist ) calling back and speaking with Dr. Rossie Muskrat Gabrielle Dare, RN  07/13/18 [phone]

## 2018-07-13 NOTE — Consults (Signed)
Clinical Pharmacy Note  Vancomycin Consult    [name] [name] is a 37 y.o. female ordered Vancomycin for sepsis; consult received from Dr. Sherrell Puller to manage therapy. Also receiving Zosyn.    Patient Active Problem List   Diagnosis   ??? SIRS (systemic inflammatory response syndrome) (HCC)   ??? Hypotension   ??? Polysubstance (excluding opioids) dependence ([place])   ??? Sepsis ([place])   ??? Hyperchloremic metabolic acidosis   ??? Elevated LFTs   ??? Polysubstance abuse ([place])   ??? Abdominal pain       Allergies:  Latex and Latex     Temp max:  Temp (24hrs), Avg:97.9 ??F (36.6 ??C), Min:97.9 ??F (36.6 ??C), Max:97.9 ??F (36.6 ??C)      No results for input(s): WBC in the last 72 hours.    No results for input(s): BUN, CREATININE in the last 72 hours.    No intake or output data in the 24 hours ending 07/13/18 1433    Culture Results:  pending    Ht Readings from Last 1 Encounters:   07/13/18 5' 5.5" (1.664 m)        Wt Readings from Last 1 Encounters:   07/13/18 128 lb 12 oz (58.4 kg)         CrCl cannot be calculated (Patient's most recent lab result is older than the maximum 10 days allowed.).    Assessment/Plan:  Will initiate vancomycin 1000 mg IV once. Thank you for the consult.  Tyaisha Cullom C Asiana Benninger, RPH,07/13/2018,2:36 PM

## 2018-07-13 NOTE — ED Notes (Signed)
Access center calling back .   Dr Graylon Gunning from [facility] ED states pt will be admitted to [facility] hospitalist.   hospitalist will call back     Laretta Bolster, RN  07/13/18 1554

## 2018-07-13 NOTE — ED Notes (Signed)
Call to Cyril Mourning, charge nurse at [facility] ED and updated that pt being transported to  [facility] by [facility].   Kristen aware that pt would be transported only to ED by EMS and then [facility] staff would have to transport pt to room 5501.     Laretta Bolster, RN  07/13/18 [phone]

## 2018-07-13 NOTE — Progress Notes (Signed)
Pt back from MRI. efusing to turn to get old sheets out. Will attempt later.

## 2018-07-13 NOTE — ED Notes (Addendum)
Access center called and states [facility] will transport pt to [facility] in 1 hour.  Pt updated     Michaelle Birks, RN  07/13/18 1801       Michaelle Birks, RN  07/13/18 [phone]

## 2018-07-13 NOTE — ED Notes (Signed)
Reassessment of pt.  Pt states pain lower back/left buttocks at 6.  Talking to pt more and encouraging her to stay awake a little while.   Keeping eyes open with more encouragement.  Updated pt on what room she is going to and offered to help call someone for her to inform family/friends.  Pt declined this.    Agreeable to turn onto side for alittle while now.   (has not wanted to change position since arrival)  Pt able to tilt body over to right and moved left leg bending it up about 60 degrees.   Pillows behind head, back/coccyx, and between legs with rolled pillow case under right foot/ankle.     Before turning to side, still has weak dorsiflexion/flexion of both feet (left still weaker than right) but overall is a little stronger than at 1635.   Now can left left heel off bed (but less than 1 inch off bed).   Can lift right heel off bed about 2 inches.   Bends right knee to about 60 degrees and only bends left knee about 20 degrees and then stops due to pain.     Michaelle Birks, RN  07/13/18 [phone]

## 2018-07-13 NOTE — ED Notes (Signed)
[phone] This RN trying to call report to [place] at [facility].   While on phone, Dr. Sherrell Puller continues to talk with Drema Pry and Meriam Sprague from [facility] concerned about passing 4 other hospitals to go to [facility] and questioning if can go to [facility].  Dr. Sherrell Puller told [facility] multiple times that pt has been accepted at [facility] and is being EMTALA transferred to [facility].  This RN told Ubaldo Glassing I would call her back and give report in a few minutes.     Dr. Sherrell Puller and Dr. [name] and this RN went outside to ambulance bay and talking again to Drema Pry and F Frazier.   Dr. Sherrell Puller asked if pt would be sent to [facility] or [facility] and Dr Sherrell Puller was told pt will go to [facility] unless if pt's condition deteriorated and care had to be diverted to closest hospital.   (ambulance pulled away approx 1858 while [facility] staff talking with Dr's. [facility]/Garber and myself.)     Laretta Bolster, RN  07/13/18 1950

## 2018-07-13 NOTE — ED Provider Notes (Addendum)
Triage Chief Complaint:   Back Pain (brought by [facility].   hx of chronic back pain .  last back pain exacerbation was 1 yr ago.   pt reports pain to lower back radiating to left buttocks since monday.  no known injury or fall.    right upper arm has track marks with redness/swelling noted.  pt told EMS that she last used heroin last evening.  squad VS:  108/66,88)    HOPI:  [name] [name] is a 37 y.o. female that presents with severe back pain in the lumbar area and in the left lumbar area radiating into the buttock area.  Patient states that started 4 days ago.  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  Has had some back pain problems in the past but she is also had previous admission with sepsis related to her ongoing substance abuse and October 2018.  Has been injecting into her right arm.  States the pain was so severe today she could not walk.  Complaining of some left leg weakness.  Denies awareness of any fever.  No abdominal pain.  No chest pain or difficulty breathing.  Has noticed some areas of redness and swelling to the right arm where she injects.  No other complaints given.    ROS:  CONSTITUTIONAL: Denies fever, chills, sweats, weight loss.  EYES: No visual disturbance.  No drainage, swelling, or pain  ENT: No ear pain or drainage, nasal congestion or bleeding, sore throat or difficulty swallowing  NECK: No neck pain, swelling, or mass  PULMONARY: No difficulty breathing, cough or congestion.  CARDIOVASCULAR: No chest pain, syncope, palpitations or edema  GASTROINTESTINAL: No abdominal or flank pain, constipation, diarrhea, nausea/vomiting, melena or hematochezia, or rectal pain  GENITOURINARY: No hematuria, dysuria, frequency or urgency.  No unusual discharge or lesions.  HEMATOLOGIC/LYMPHATIC: No unusual bruising, bleeding, or swollen glands  ENDOCRINE: No excessive thirst.  No polyuria.  DERMATOLOGIC: No skin lesions, rash, itching, or redness.  MUSCULOSKELETAL: No neck pain.  Severe back  pain with radiation into the left buttock and thigh as noted above.  Left leg pain and weakness per patient and inability to walk.  No other joint pain, muscle pain, or unusual swelling of extremities  NEUROLOGIC: No headaches, altered mental status, dizziness.  No speech difficulty.  Left leg pain and weakness and inability to stand.  PSYCHIATRIC: No suicidal or homicidal thoughts, substance abuse, depression, or hallucinations  Past Medical History:   Diagnosis Date   . Anxiety    . Benign tumor of pituitary gland (HCC)     ?cancer or not   . Bipolar affective disorder (HCC)    . Cervical fusion syndrome    . Chronic pain    . Depression    . Gestational diabetes mellitus    . Hepatitis C 08/02/2017   . 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 History of heroin abuse    . HPV in female    . Miscarriage    . 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 Neck pain    . OCD (obsessive compulsive disorder)    . Paranoia (HCC)    . Tobacco abuse      Past Surgical History:   Procedure Laterality Date   . CERVICAL SPINE SURGERY      C1-C2 fusion 2003   . CESAREAN SECTION     . ECTOPIC PREGNANCY SURGERY       Family History  Problem Relation Age of Onset   . Arthritis Other    . Asthma Other    . Cancer Other    . Diabetes Other    . Kidney Disease Other      Social History     Socioeconomic History   . Marital status: Divorced     Spouse name: Not on file   . Number of children: 1   . Years of education: Not on file   . Highest education level: Not on file   Occupational History   . Not on file   Social Needs   . Financial resource strain: Not on file   . Food insecurity:     Worry: Not on file     Inability: Not on file   . Transportation needs:     Medical: Not on file     Non-medical: Not on file   Tobacco Use   . Smoking status: Current Every Day Smoker     Packs/day: 0.50     Years: 11.00     Pack years: 5.50     Types: Cigarettes   . Smokeless tobacco: Never Used   Substance and Sexual Activity   . Alcohol use: Yes     Comment:  every 2 months   . Drug use: Yes     Types: Cocaine, Opiates , Marijuana     Comment: 07/13/18 heroin, cocaine, marijuana   . Sexual activity: Not on file   Lifestyle   . Physical activity:     Days per week: Not on file     Minutes per session: Not on file   . Stress: Not on file   Relationships   . Social connections:     Talks on phone: Not on file     Gets together: Not on file     Attends religious service: Not on file     Active member of club or organization: Not on file     Attends meetings of clubs or organizations: Not on file     Relationship status: Not on file   . Intimate partner violence:     Fear of current or ex partner: Not on file     Emotionally abused: Not on file     Physically abused: Not on file     Forced sexual activity: Not on file   Other Topics Concern   . Not on file   Social History Narrative    ** Merged History Encounter **          Current Facility-Administered Medications   Medication Dose Route Frequency Provider Last Rate Last Dose   . sodium chloride flush 0.9 % injection 10 mL  10 mL Intravenous 2 times per day Elwyn Lade, MD       . sodium chloride flush 0.9 % injection 10 mL  10 mL Intravenous PRN Elwyn Lade, MD         Current Outpatient Medications   Medication Sig Dispense Refill   . METFORMIN HCL PO Take 500 mg by mouth daily 07/13/18 pt states hasn't taken this for awhile, "it made my sugar too low"     . buprenorphine-naloxone (SUBOXONE) 8-2 MG FILM SL film Place 2 Film under the tongue daily..       Allergies   Allergen Reactions   . Latex Itching, Rash and Other (See Comments)     Burning to the touch    . Latex Rash  Nursing Notes Reviewed    Physical Exam:  ED Triage Vitals   Enc Vitals Group      BP 07/13/18 1220 (!) 143/68      Pulse 07/13/18 1220 93      Resp 07/13/18 1220 18      Temp 07/13/18 1220 97.9 F (36.6 C)      Temp Source 07/13/18 1400 Oral      SpO2 07/13/18 1220 96 %      Weight 07/13/18 1220 128 lb 12 oz (58.4 kg)      Height  07/13/18 1220 5' 5.5" (1.664 m)      Head Circumference --       Peak Flow --       Pain Score --       Pain Loc --       Pain Edu? --       Excl. in GC? --        My pulse ox interpretation is - normal    GENERAL: Slender ill-appearing female dozing but does arouse easily soon as I call her name and in moderate distress.  Appropriate demeanor.  Last injected reportedly last night but does appear to be somewhat sedated and is dozing  Vital signs: Reviewed as documented  HEAD: Normocephalic, atraumatic  EYES: No unusual swelling.  Conjunctiva pink.  No discharge and no lesions.  Pupils equally round and reactive to light, extraocular movement is intact  ENT: Ears are without any discharge or swelling.  Nose is clear.  No facial swelling.  Mucous membranes moist, no oral lesions, and pharynx is clear without edema or exudates.  Voice quality is clear.  No drooling.  NECK: Supple, no mass, nontender to palpation.  No JVD noted.  PULMONARY: No chest wall tenderness or evidence of trauma.  Nonlabored.  Breath sounds are clear and equal bilaterally without wheezing, rales, or rhonchi.  CARDIOVASCULAR: Regular rate and rhythm without murmur, rubs or gallops appreciated.  Pulses are 2+ and equal bilaterally with normal capillary refill.  No edema.  GASTROINTESTINAL: Abdomen is soft and nontender throughout with active bowel sounds.  No organomegaly noted.  No CVA tenderness.  MUSCULOSKELETAL: Back exquisitely tender in the left lumbar sacroiliac area.  There is no evidence of gross swelling but she screams out in pain when even sitting upright.  Some very small amount of swelling to the dorsum of the right foot.  Resists range of motion of the left lower extremity due to pain and also somewhat also of the right lower extremity.  DTRs are equal bilaterally.  Right arm with track marks in the antecubital area.  There is evidence of small abscesses in the antecubital and in the mid right upper arm likely at sites of injection.   No surrounding cellulitis or ascending lymphangitis.  DERMATOLOGIC: Skin is warm pink and dry.  Suspected abscesses in the right upper extremity as noted above.    NEUROLOGIC: Awake though appears to be likely under the influence of narcotics, and oriented 4, cranial nerves II through XII are intact with no facial droop.  Motor strength 5/5 in bilateral upper extremities.  Some resistance with motor strength testing with lower extremities due to her back pain.  There does appear to be some weakness of the left lower extremity although her cooperation is poor.  With any movement of the legs she screams out with pain in her back.  Sensory function grossly intact throughout to light touch and pain.  Speech is  normal.  DTRs are 1+ and equal bilaterally.  Unable to assess gait as she cannot stand.  PSYCHIATRIC: Awake and cooperative.  No evidence of hallucinations.  Appropriate in demeanor.      I have reviewed and interpreted all of the currently available lab results from this visit (if applicable):  Results for orders placed or performed during the hospital encounter of 07/13/18   Lactate, Sepsis   Result Value Ref Range    Lactic Acid, Sepsis 0.8 0.4 - 1.9 mmol/L   CBC Auto Differential   Result Value Ref Range    WBC 7.7 4.0 - 11.0 K/uL    RBC 3.82 (L) 4.00 - 5.20 M/uL    Hemoglobin 10.5 (L) 12.0 - 16.0 g/dL    Hematocrit 65.7 (L) 36.0 - 48.0 %    MCV 83.7 80.0 - 100.0 fL    MCH 27.5 26.0 - 34.0 pg    MCHC 32.9 31.0 - 36.0 g/dL    RDW 84.6 96.2 - 95.2 %    Platelets 122 (L) 135 - 450 K/uL    MPV 8.7 5.0 - 10.5 fL    Neutrophils % 71.7 %    Lymphocytes % 17.5 %    Monocytes % 9.8 %    Eosinophils % 0.1 %    Basophils % 0.9 %    Neutrophils Absolute 5.5 1.7 - 7.7 K/uL    Lymphocytes Absolute 1.3 1.0 - 5.1 K/uL    Monocytes Absolute 0.7 0.0 - 1.3 K/uL    Eosinophils Absolute 0.0 0.0 - 0.6 K/uL    Basophils Absolute 0.1 0.0 - 0.2 K/uL   Comprehensive Metabolic Panel w/ Reflex to MG   Result Value Ref Range    Sodium  139 136 - 145 mmol/L    Potassium reflex Magnesium 3.7 3.5 - 5.1 mmol/L    Chloride 100 99 - 110 mmol/L    CO2 29 21 - 32 mmol/L    Anion Gap 10 3 - 16    Glucose 113 (H) 70 - 99 mg/dL    BUN 11 7 - 20 mg/dL    CREATININE <8.4 (L) 0.6 - 1.1 mg/dL    GFR Non-African American >60 >60    GFR African American >60 >60    Calcium 9.3 8.3 - 10.6 mg/dL    Total Protein 7.5 6.4 - 8.2 g/dL    Alb 3.6 3.4 - 5.0 g/dL    Albumin/Globulin Ratio 0.9 (L) 1.1 - 2.2    Total Bilirubin 0.4 0.0 - 1.0 mg/dL    Alkaline Phosphatase 79 40 - 129 U/L    ALT 15 10 - 40 U/L    AST 38 (H) 15 - 37 U/L    Globulin 3.9 g/dL   Sedimentation Rate   Result Value Ref Range    Sed Rate 75 (H) 0 - 20 mm/Hr   Protime-INR   Result Value Ref Range    Protime 14.5 (H) 9.8 - 13.0 sec    INR 1.28 (H) 0.86 - 1.14      Radiographs (if obtained):  []  The following radiograph was interpreted by myself in the absence of a radiologist:   []  Radiologist's Report Reviewed:  XR CHEST PORTABLE   Final Result   1.  No acute abnormality.               EKG (if obtained): (All EKG's are interpreted by myself in the absence of a cardiologist)    Chart review shows recent radiographs:  Xr  Chest Portable    Result Date: 07/13/2018  EXAMINATION: ONE XRAY VIEW OF THE CHEST 07/13/2018 1:31 pm COMPARISON: 08/01/2017 HISTORY: ORDERING SYSTEM PROVIDED HISTORY: Back pain TECHNOLOGIST PROVIDED HISTORY: Reason for exam:->Back pain Reason for Exam: PT. C/O RADIATING LT. LOWER BACK PAIN THAT RADIATES DOWN TO LT. BUTTOCKS AND LT. LEG X 3 DAYS, DENIES INJURY Acuity: Acute Type of Exam: Initial Relevant Medical/Surgical History: PT. STATES HAS HX OF HEP C FINDINGS: The lungs are clear.  The cardiac silhouette is within normal limits.  There is no pneumothorax or pleural effusion.     1.  No acute abnormality.       MDM:  Patient is here with known history of IV drug abuse, severe back pain with radicular symptoms.  Clear evidence of some infections in her right upper extremity where she  injects with likely localized abscesses.  Will obtain laboratory studies and sepsis evaluation.  Patient will need MRI imaging.  Once I have laboratory studies that I will consult with [facility] for transfer.    CBC and chemistries are normal but sed rate is elevated at 75.  Patient was given Zosyn and vancomycin and blood cultures were obtained.  Lactic acid is normal.  We will consult with emergency physician at [facility] for potential transfer.    I spoke with Dr. willing at [facility] emergency department at 1540 p.m..  He is going to talk with the inpatient team at [facility] as well and neurosurgery.  They will then call me back.    I was referred to hospitalist service.  I spoke with Dr.Shehzad who agrees to accept the patient in transfer there.  They will then obtain emergent imaging to rule out epidural abscess.  Will transfer in stable condition.    Patient was accepted for transfer.  Did develop urinary incontinence.  However there was a delay on getting a bed assignment.  I contacted the ER once more to see if we could get her transferred at least to the emergency department for emergent imaging however the emergency physician then contacted bed services and was able to get a bed assignment.  We then contacted ambulance for transfer and we were subsequently notified that the ambulance in route here had broken down.  There would be a significant delay for further transport and therefore we contacted 911 and had the fire department, and load the patient and take the patient to [facility].  She left the facility at 1900.      Clinical Impression:  1. Acute left-sided low back pain with sciatica, sciatica laterality unspecified    2. Abscess of right arm      Disposition referral (if applicable):  No follow-up provider specified.  Disposition medications (if applicable):  New Prescriptions    No medications on file       Comment: Please note this report has been produced using speech recognition software and may contain  errors related to that system including errors in grammar, punctuation, and spelling, as well as words and phrases that may be inappropriate. If there are any questions or concerns please feel free to contact the dictating provider for clarification.        Elwyn Lade, MD  07/13/18 1625       Elwyn Lade, MD  07/13/18 [phone]

## 2018-07-13 NOTE — ED Notes (Addendum)
[phone] extensive report called to [place] at [facility] 5th floor.   ED encounter summary already sent with pt.        Michaelle Birks, RN  07/13/18 1951       Michaelle Birks, RN  07/14/18 [phone]

## 2018-07-13 NOTE — ED Notes (Signed)
Dr. Sherrell Puller aware of transport changes. Requested that we call 911 for patient to be transported via EMS squad. [phone] Emergency Access notifed w, will send a squad.  [place] notified of change, spoke with Carlton Adam, RN  07/13/18 [phone]

## 2018-07-13 NOTE — ED Notes (Addendum)
IV site leaking.   Pt accidentally pulled angiocath 3/4 of the way out.  Able to reinsert angiocath and IV not leaking anymore.      Pt sleeps when left alone.    Has had eyes closed almost entire time here in ED.  Pt states pain still in lower back radiating to left buttocks at 6.  When she tries to move around, pain radiates all the way down left leg.   Denies numbness/tingling in legs.   Pt has weak dorsiflexion/flexion of both feet (left weaker than right).   Pt cannot pull up either leg to 90 degrees or almost 90 degrees.   Pt will let me lift right leg to this position and she can hold it there.   Pt will not let me lift left leg to bent position at all.    Can barely lift right heel off bed about 1 inch. Cannot lift left heel off the bed at all.   Pt uncooperative with exam.   Will not try to sit up.  Will not turn side to side.   Will not try to give urine specimen.   Keeps eyes closed throughout exam.        Laretta Bolster, RN  07/13/18 [place], RN  07/13/18 Ina Shantana Christon, RN  07/13/18 1655

## 2018-07-13 NOTE — ED Notes (Signed)
Sleeps when left alone.  Awakens to name.  Denies using any heroin/drugs or alcohol today.  Pupils at 3-4 mm bilat and reactive.   Explained new orders.   Back pain at [place], [place]  07/13/18 1442

## 2018-07-13 NOTE — ED Notes (Signed)
[phone] pt wanted to go to the bathroom and wanted to try to get up to [facility] next to bed.     Two RN's present for safety.   Pt moves very slowly but able to transfer from right side lying to sit on side of bed with min. Assist only.  Pt wants to move legs/body herself.   Pt able to bear weight on legs/feet  Pt was not incontinent and crouched down over BSC.  Pt didn't want to sit down all the way onto [facility].  Voided 700cc clear amber urine.   Pt then stood up and very slowly transferred to stretcher.  Pt needed to help get feet /legs up about 3 more inches to get into bed.    Again pillows under back/coccyx and between legs and rolled pillow case under right foot.          Laretta Bolster, RN  07/13/18 [phone]

## 2018-07-13 NOTE — ED Notes (Signed)
A[facility] from access center called and Dr. Arnell Sieving now speaking with Dr. Sherrell Puller.   Dr. Arnell Sieving at [facility] is going to make some phone calls and see if he can facilitate getting a room for pt and someone will call us back.       Laretta Bolster, RN  07/13/18 1728

## 2018-07-13 NOTE — ED Notes (Signed)
[phone] multiple [facility] staff here.  approx 6 fire/medics here and [facility] ED resident, Lt. Nanetta Batty and Meriam Sprague all at nurses' desk and receiving report..   Report at bedside also given directly to B [facility] Mac who will be transporting pt to [place].   Reassessed pt at bedside in front of B [place] and [facility] ED resident.    Pt still has weak dorsiflexion/flexion.   Cannot lift left heel off of bed.  Can lift right heel off bed approx 2 inches.  Normal sensation to legs/arms.   Remains in sinus rhythm.  ED encounter summary printed and given to [facility] with face sheet.   At 1852, EMS had already transferred pt over to their stretcher and took pt out to ambulance.        Laretta Bolster, RN  07/13/18 1941

## 2018-07-13 NOTE — ED Notes (Signed)
brought by [facility].   hx of chronic back pain .  last back pain exacerbation was 1 yr ago.   pt reports pain to lower back radiating to left buttocks since monday.  no known injury or fall.    right upper arm has track marks with redness/swelling noted.  pt told EMS that she last used heroin last evening.  squad VS:  108/66,88     Laretta Bolster, RN  07/13/18 [phone]

## 2018-07-13 NOTE — Progress Notes (Signed)
Patient arrived to ED by 911.  Discussed with Dr. Durel Salts, patient thought to need higher level of care, transfer order placed to go directly to ICU.  Placed in bed 4521.

## 2018-07-13 NOTE — ED Notes (Signed)
[phone] This RN calling access center again.  Explained to [employer] that pt needs [facility] assigned.   While on phone, new room assigned room 5512 rm 1 but it needs to be cleaned.   Then [employer] in [place], [place] [employer] manager and discussed situation.   Explained that ambulance transportation cannot be arranged till room is marked clean.    Updated Dr. Sherrell Puller also while on this call who states then to call back to [facility] ED physician and see if pt can go to ED while waiting for room to be ready .         Laretta Bolster, RN  07/13/18 1721

## 2018-07-13 NOTE — ED Notes (Signed)
A[facility] from access center called and call transferred to Dr Sherrell Puller.   Dr Sherrell Puller sitting on hold waiting to be connected to ED physician at [facility] [place]  07/13/18 1722

## 2018-07-13 NOTE — ED Notes (Signed)
Multiple track marks to arms.   Cleaned right hand for 2 mins with chlor hexadine prep before IV inserted/labs drawn.  Able to insert 20 g angio top of right hand below ring finger.   tol well.        Laretta Bolster, RN  07/13/18 1447

## 2018-07-13 NOTE — Consults (Signed)
Clinical Pharmacy Consult Note    Admit date: 07/13/2018    Subjective/Objective:  37 yo female with PMH significant for bipolar disorder, OCD, DM2, Hashimoto's thyroiditis, pituitary adenoma, and ploysubstance abuse including cocaine and IVDU (used within the past 24 hrs) presented to OSH with 4 days worsening lower back pain, upper/lower extremity weakness and numbness, and right arm abscess. She was transferred to [facility] d/t concern for spinal abscess and cord compression.    Pharmacy is consulted to dose Vancomycin per Dr. Zorita Pang    Pertinent Medications:  Vancomycin 1 g IV q8h -- day # 1  Piperacillin/tazobactam 3.375 g IV q8h EI -- day #1    PERTINENT LABS:  Recent Labs     07/13/18  1412   NA 139   K 3.7   CL 100   CO2 29   BUN 11   CREATININE <0.5*       CrCl cannot be calculated (This lab value cannot be used to calculate CrCl because it is not a number: <0.5).     Recent Labs     07/13/18  1412   WBC 7.7   HGB 10.5*   HCT 31.9*   MCV 83.7   PLT 122*       Height:  5\' 5"  (165.1 cm)  Weight: 129 lb (58.5 kg)    Micro:  Date Site Micro Susceptibility   07/13/18 Blood culture x 2 In process    07/13/18 Urine culture collected              Assessment/Plan:  1. Spinal abscess: Vancomycin + Zosyn day #1  Vancomycin  ?? Pt received vancomycin 1 g (15 mg/kg) IV x 1 dose in the ED at OSH. Will order 1 g IV q8h (will schedule to start 6.5 hrs after first dose since a loading dose was not given). This provides ~ 17 mg/kg and is based on a half-life elimination of 6 hours.  ?? Clinical pharmacist will follow-up in AM.  ?? Renal function will be monitored closely and dosing will be adjusted as appropriate.    Please call with any questions.    Thank you for consulting pharmacy!  Rennie Natter, PharmD, BCCCP  [phone]  07/13/2018 8:41 PM
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
## 2018-07-13 NOTE — ED Notes (Signed)
Bed # [id number]  Phone # [phone]  Transport TBD     Catalina Pizza, RN  07/13/18 1734

## 2018-07-13 NOTE — ED Notes (Signed)
Fall risk screening completed.  Fall risk bracelet applied to patient.  Non-skid socks provided and placed on patient.  The fall risk is indicated using  fall sign . The call light is within the patient's reach, and instructions for use were provided.  The bed is in the lowest position with wheels locked.  The patient has been advised to notify staff, using the call light, if there is a need to get up or use restroom.  The patient verbalized understanding of safety precautions and how to contact staff for assistance.       Laretta Bolster, RN  07/13/18 [phone]

## 2018-07-13 NOTE — ED Notes (Signed)
Low back pain radiating to left buttocks at [address] RN  07/13/18 1455

## 2018-07-13 NOTE — Other (Signed)
Result Reviewed, No further treatment needed

## 2018-07-13 NOTE — ED Notes (Addendum)
Pt wouldn't transfer over to stretcher.  Insists on EMS pulling her over to the stretcher.    Pt keeps eyes closed.  Denies numbness/tingling to legs.   Moves legs weakly.   Pt states moving around causes severe pain--pt reluctant to move.   Pupils at 3 mm bilat and reactive to light.       Michaelle Birks, RN  07/13/18 1659       Michaelle Birks, RN  07/13/18 1725

## 2018-07-13 NOTE — ED Notes (Addendum)
Updated [facility] RN supervisor about situation who will Barrister's clerk of ED.         Laretta Bolster, RN  07/13/18 [place], RN  07/13/18 1743

## 2018-07-13 NOTE — ED Notes (Signed)
Dr Reed Pandy able to talk to [facility] ED physician. [facility] ED physician wants to speak to inpt medical team about case first and will call us back     Michaelle Birks, RN  07/13/18 1553

## 2018-07-13 NOTE — ED Notes (Signed)
EMD to bedside     Laretta Bolster, RN  07/13/18 1335

## 2018-07-13 NOTE — ED Notes (Signed)
Call to access center for consult to [facility] ED physician for transfer there for severe back pain, known IV drug abuse, possible cord compression.     Laretta Bolster, RN  07/13/18 1538

## 2018-07-13 NOTE — Progress Notes (Signed)
Pt arrived without prior warning. No N-N report received except pt is IV drug abuser, needs MRI ASAP and may have possible spinal abcess. Supervisor also came to give information , but only able to say she was coming and needs the MRI. No other info in a N-N report. See flowsheet for assessment. Resident in to see pt now.

## 2018-07-13 NOTE — ED Notes (Addendum)
Access center called and states pt will be admitted to [facility] room [facility]10 room 1.   Phone number for report is [phone].   Dr. Reed Pandy states pt can be transported by BLS ambulance.    [facility] admitting hospitalist requested regular hospital bed without monitor inplace.       Michaelle Birks, RN  07/13/18 1700       Michaelle Birks, RN  07/13/18 1715       Michaelle Birks, RN  07/13/18 [phone]

## 2018-07-13 NOTE — ED Notes (Addendum)
Awakened for CXR to be done.   Awakens easily and then goes back to sleep.   Pain lower back/left buttocks at 6.    Weak dorsiflexion/flexion of both feet.  Normal sensation legs.  Pt didn't have shoes on when arrived by EMS.  When pt asked about this, pt states the paramedics wouldn't get my shoes to bring them.  Pt states she was walking at home( with help) but dragging her left foot.  Pt will not move around much on stretcher despite encouragement to do so.    No wound/visible abnormality in lower back area      Laretta Bolster, RN  07/14/18 [place], RN  07/14/18 [place] Jammie Clink, RN  07/14/18 0040

## 2018-07-14 ENCOUNTER — Inpatient Hospital Stay: Admit: 2018-07-14 | Payer: PRIVATE HEALTH INSURANCE | Primary: Family Medicine

## 2018-07-14 LAB — CBC WITH AUTO DIFFERENTIAL
Basophils %: 0.5 %
Basophils Absolute: 0 10*3/uL (ref 0.0–0.2)
Eosinophils %: 0.7 %
Eosinophils Absolute: 0 10*3/uL (ref 0.0–0.6)
Hematocrit: 31 % — ABNORMAL LOW (ref 36.0–48.0)
Hemoglobin: 10.2 g/dL — ABNORMAL LOW (ref 12.0–16.0)
Lymphocytes %: 25.2 %
Lymphocytes Absolute: 1.6 10*3/uL (ref 1.0–5.1)
MCH: 28.2 pg (ref 26.0–34.0)
MCHC: 32.8 g/dL (ref 31.0–36.0)
MCV: 85.8 fL (ref 80.0–100.0)
MPV: 8.7 fL (ref 5.0–10.5)
Monocytes %: 10.9 %
Monocytes Absolute: 0.7 10*3/uL (ref 0.0–1.3)
Neutrophils %: 62.7 %
Neutrophils Absolute: 4.1 10*3/uL (ref 1.7–7.7)
Platelets: 121 10*3/uL — ABNORMAL LOW (ref 135–450)
RBC: 3.61 M/uL — ABNORMAL LOW (ref 4.00–5.20)
RDW: 14.8 % (ref 12.4–15.4)
WBC: 6.5 10*3/uL (ref 4.0–11.0)

## 2018-07-14 LAB — ECHOCARDIOGRAM LIMITED: Left Ventricular Ejection Fraction: 58

## 2018-07-14 LAB — HIV SCREEN
HIV ANTIGEN: NONREACTIVE
HIV Ag/Ab: NONREACTIVE
HIV-1 Antibody: NONREACTIVE
HIV-2 Ab: NONREACTIVE

## 2018-07-14 LAB — COMPREHENSIVE METABOLIC PANEL W/ REFLEX TO MG FOR LOW K
ALT: 12 U/L (ref 10–40)
AST: 26 U/L (ref 15–37)
Albumin/Globulin Ratio: 0.9 — ABNORMAL LOW (ref 1.1–2.2)
Albumin: 2.9 g/dL — ABNORMAL LOW (ref 3.4–5.0)
Alkaline Phosphatase: 58 U/L (ref 40–129)
Anion Gap: 11 (ref 3–16)
BUN: 7 mg/dL (ref 7–20)
CO2: 25 mmol/L (ref 21–32)
Calcium: 8.4 mg/dL (ref 8.3–10.6)
Chloride: 104 mmol/L (ref 99–110)
Creatinine: <0.5 mg/dL — ABNORMAL LOW (ref 0.6–1.1)
GFR African American: >60 (ref >60)
GFR Non-African American: >60 (ref >60)
Globulin: 3.2 g/dL
Glucose: 93 mg/dL (ref 70–99)
Potassium reflex Magnesium: 3.5 mmol/L (ref 3.5–5.1)
Sodium: 140 mmol/L (ref 136–145)
Total Bilirubin: 0.3 mg/dL (ref 0.0–1.0)
Total Protein: 6.1 g/dL — ABNORMAL LOW (ref 6.4–8.2)

## 2018-07-14 LAB — PREGNANCY, URINE: Pregnancy, Urine: NEGATIVE

## 2018-07-14 LAB — HEMOGLOBIN A1C
Estimated Avg Glucose: 137 mg/dL
Hemoglobin A1C: 6.4 %

## 2018-07-14 LAB — PROTIME-INR
INR: 1.24 — ABNORMAL HIGH (ref 0.86–1.14)
Protime: 14.1 s — ABNORMAL HIGH (ref 9.8–13.0)

## 2018-07-14 LAB — EKG 12-LEAD
Atrial Rate: 74 {beats}/min
P Axis: 76 degrees
P-R Interval: 152 ms
Q-T Interval: 410 ms
QRS Duration: 92 ms
QTc Calculation (Bazett): 455 ms
R Axis: 89 degrees
T Axis: 63 degrees
Ventricular Rate: 74 {beats}/min

## 2018-07-14 LAB — C.TRACHOMATIS N.GONORRHOEAE DNA, URINE
C. trachomatis DNA ,Urine: NEGATIVE
N. gonorrhoeae DNA, Urine: NEGATIVE

## 2018-07-14 LAB — POCT GLUCOSE: POC Glucose: 161 mg/dl — ABNORMAL HIGH (ref 70–99)

## 2018-07-14 LAB — TSH WITH REFLEX: TSH: 0.31 u[IU]/mL (ref 0.27–4.20)

## 2018-07-14 LAB — C-REACTIVE PROTEIN: CRP: 127.7 mg/L — ABNORMAL HIGH (ref 0.0–5.1)

## 2018-07-14 LAB — APTT: aPTT: 29.9 s (ref 26.0–36.0)

## 2018-07-14 LAB — MAGNESIUM: Magnesium: 1.8 mg/dL (ref 1.80–2.40)

## 2018-07-14 LAB — LACTIC ACID: Lactic Acid: 0.6 mmol/L (ref 0.4–2.0)

## 2018-07-14 MED ORDER — LORAZEPAM 2 MG/ML IJ SOLN
2 MG/ML | Freq: Once | INTRAMUSCULAR | Status: AC
Start: 2018-07-14 — End: 2018-07-14
  Administered 2018-07-14: 15:00:00 2 mg via INTRAVENOUS

## 2018-07-14 MED ORDER — HYDROMORPHONE HCL 1 MG/ML IJ SOLN
1 MG/ML | Freq: Once | INTRAMUSCULAR | Status: DC
Start: 2018-07-14 — End: 2018-07-13

## 2018-07-14 MED ORDER — DEXTROSE 5 % IV SOLN
5 % | Freq: Three times a day (TID) | INTRAVENOUS | Status: DC
Start: 2018-07-14 — End: 2018-07-15
  Administered 2018-07-14 – 2018-07-15 (×5): 1000 mg via INTRAVENOUS

## 2018-07-14 MED ORDER — MORPHINE SULFATE (PF) 2 MG/ML IV SOLN
2 MG/ML | Freq: Once | INTRAVENOUS | Status: AC
Start: 2018-07-14 — End: 2018-07-13
  Administered 2018-07-14: 01:00:00 2 mg via INTRAVENOUS

## 2018-07-14 MED ORDER — DEXTROSE 5 % IV SOLN
5 % | Freq: Three times a day (TID) | INTRAVENOUS | Status: AC
Start: 2018-07-14 — End: 2018-07-15
  Administered 2018-07-14 – 2018-07-15 (×3): 1000 mg via INTRAVENOUS

## 2018-07-14 MED ORDER — DEXTROSE 5 % IV SOLN (MINI-BAG)
5 % | Freq: Three times a day (TID) | INTRAVENOUS | Status: DC
Start: 2018-07-14 — End: 2018-07-13

## 2018-07-14 MED ORDER — SODIUM CHLORIDE 0.9 % IV SOLN
0.9 % | INTRAVENOUS | Status: DC
Start: 2018-07-14 — End: 2018-07-14
  Administered 2018-07-14 (×2): via INTRAVENOUS

## 2018-07-14 MED ORDER — SODIUM CHLORIDE 0.9 % IV SOLN
0.9 % | INTRAVENOUS | Status: DC
Start: 2018-07-14 — End: 2018-07-17
  Administered 2018-07-14: 14:00:00 0.4 ug/kg/h via INTRAVENOUS
  Administered 2018-07-14: 23:00:00 1.4 ug/kg/h via INTRAVENOUS
  Administered 2018-07-14: 19:00:00 1 ug/kg/h via INTRAVENOUS
  Administered 2018-07-15 (×4): 1.4 ug/kg/h via INTRAVENOUS
  Administered 2018-07-16: 02:00:00 0.8 ug/kg/h via INTRAVENOUS
  Administered 2018-07-16: 15:00:00 0.7 ug/kg/h via INTRAVENOUS
  Administered 2018-07-17: 02:00:00 1.2 ug/kg/h via INTRAVENOUS
  Administered 2018-07-17: 10:00:00 0.3 ug/kg/h via INTRAVENOUS

## 2018-07-14 MED ORDER — DEXTROSE 50 % IV SOLN
50 | INTRAVENOUS | Status: DC | PRN
Start: 2018-07-14 — End: 2018-07-18

## 2018-07-14 MED ORDER — CEFTRIAXONE SODIUM 2 G IJ SOLR
2 g | Freq: Two times a day (BID) | INTRAMUSCULAR | Status: DC
Start: 2018-07-14 — End: 2018-07-14
  Administered 2018-07-14 (×2): 2 g via INTRAVENOUS

## 2018-07-14 MED ORDER — ACETAMINOPHEN 325 MG PO TABS
325 MG | ORAL | Status: DC | PRN
Start: 2018-07-14 — End: 2018-07-14
  Administered 2018-07-14: 03:00:00 650 mg via ORAL

## 2018-07-14 MED ORDER — LORAZEPAM 2 MG/ML IJ SOLN
2 MG/ML | Freq: Once | INTRAMUSCULAR | Status: AC
Start: 2018-07-14 — End: 2018-07-13
  Administered 2018-07-14: 01:00:00 1 mg via INTRAVENOUS

## 2018-07-14 MED ORDER — MUPIROCIN 2 % EX OINT
2 % | Freq: Two times a day (BID) | CUTANEOUS | Status: DC
Start: 2018-07-14 — End: 2018-07-18
  Administered 2018-07-15 – 2018-07-18 (×7): 1 g via NASAL

## 2018-07-14 MED ORDER — HYDROMORPHONE HCL 1 MG/ML IJ SOLN
1 MG/ML | Freq: Once | INTRAMUSCULAR | Status: AC | PRN
Start: 2018-07-14 — End: 2018-07-14
  Administered 2018-07-14: 16:00:00 1 mg via INTRAVENOUS

## 2018-07-14 MED ORDER — DEXTROSE 5 % IV SOLN
5 | INTRAVENOUS | Status: DC | PRN
Start: 2018-07-14 — End: 2018-07-18

## 2018-07-14 MED ORDER — HYDROMORPHONE HCL 1 MG/ML IJ SOLN
1 MG/ML | Freq: Once | INTRAMUSCULAR | Status: AC | PRN
Start: 2018-07-14 — End: 2018-07-14
  Administered 2018-07-14: 15:00:00 1 mg via INTRAVENOUS

## 2018-07-14 MED ORDER — OXYCODONE-ACETAMINOPHEN 5-325 MG PO TABS
5-325 MG | Freq: Four times a day (QID) | ORAL | Status: DC | PRN
Start: 2018-07-14 — End: 2018-07-14
  Administered 2018-07-14: 17:00:00 1 via ORAL

## 2018-07-14 MED ORDER — METHOCARBAMOL 750 MG PO TABS
750 MG | Freq: Four times a day (QID) | ORAL | Status: DC
Start: 2018-07-14 — End: 2018-07-18
  Administered 2018-07-15 – 2018-07-18 (×12): 750 mg via ORAL

## 2018-07-14 MED ORDER — DIAZEPAM 5 MG PO TABS
5 MG | Freq: Four times a day (QID) | ORAL | Status: DC | PRN
Start: 2018-07-14 — End: 2018-07-18
  Administered 2018-07-14 – 2018-07-18 (×15): 5 mg via ORAL

## 2018-07-14 MED ORDER — IBUPROFEN 400 MG PO TABS
400 MG | ORAL | Status: DC | PRN
Start: 2018-07-14 — End: 2018-07-14
  Administered 2018-07-14 (×2): 400 mg via ORAL

## 2018-07-14 MED ORDER — TRAMADOL HCL 50 MG PO TABS
50 MG | Freq: Four times a day (QID) | ORAL | Status: DC | PRN
Start: 2018-07-14 — End: 2018-07-18
  Administered 2018-07-14 – 2018-07-18 (×14): 50 mg via ORAL

## 2018-07-14 MED ORDER — GLUCOSE 40 % PO GEL
40 % | ORAL | Status: DC | PRN
Start: 2018-07-14 — End: 2018-07-18

## 2018-07-14 MED ORDER — NORMAL SALINE FLUSH 0.9 % IV SOLN
0.9 | Freq: Two times a day (BID) | INTRAVENOUS | Status: DC
Start: 2018-07-14 — End: 2018-07-18
  Administered 2018-07-15 – 2018-07-17 (×4): 10 mL via INTRAVENOUS

## 2018-07-14 MED ORDER — METRONIDAZOLE 500 MG PO TABS
500 MG | Freq: Once | ORAL | Status: AC
Start: 2018-07-14 — End: 2018-07-13
  Administered 2018-07-14: 03:00:00 2000 mg via ORAL

## 2018-07-14 MED ORDER — NORMAL SALINE FLUSH 0.9 % IV SOLN
0.9 | INTRAVENOUS | Status: DC | PRN
Start: 2018-07-14 — End: 2018-07-18

## 2018-07-14 MED ORDER — LIDOCAINE 4 % EX PTCH
4 % | Freq: Every day | CUTANEOUS | Status: DC
Start: 2018-07-14 — End: 2018-07-18
  Administered 2018-07-14 – 2018-07-17 (×5): 1 via TRANSDERMAL

## 2018-07-14 MED ORDER — DEXTROSE 5 % IV SOLN (MINI-BAG)
5 % | Freq: Two times a day (BID) | INTRAVENOUS | Status: DC
Start: 2018-07-14 — End: 2018-07-18
  Administered 2018-07-15 – 2018-07-18 (×7): 2 g via INTRAVENOUS

## 2018-07-14 MED ORDER — HYDROMORPHONE HCL 1 MG/ML IJ SOLN
1 MG/ML | Freq: Once | INTRAMUSCULAR | Status: AC | PRN
Start: 2018-07-14 — End: 2018-07-13
  Administered 2018-07-14: 02:00:00 1 mg via INTRAVENOUS

## 2018-07-14 MED ORDER — GADOTERIDOL 279.3 MG/ML IV SOLN
279.3 MG/ML | Freq: Once | INTRAVENOUS | Status: AC | PRN
Start: 2018-07-14 — End: 2018-07-14
  Administered 2018-07-14: 16:00:00 12 mL via INTRAVENOUS

## 2018-07-14 MED ORDER — OXYCODONE-ACETAMINOPHEN 5-325 MG PO TABS
5-325 MG | Freq: Four times a day (QID) | ORAL | Status: DC | PRN
Start: 2018-07-14 — End: 2018-07-14
  Administered 2018-07-14: 09:00:00 1 via ORAL

## 2018-07-14 MED ORDER — LOPERAMIDE HCL 2 MG PO CAPS
2 MG | Freq: Four times a day (QID) | ORAL | Status: DC | PRN
Start: 2018-07-14 — End: 2018-07-18

## 2018-07-14 MED ORDER — ONDANSETRON HCL 4 MG/2ML IJ SOLN
4 MG/2ML | Freq: Four times a day (QID) | INTRAMUSCULAR | Status: DC | PRN
Start: 2018-07-14 — End: 2018-07-18

## 2018-07-14 MED ORDER — ACETAMINOPHEN 325 MG PO TABS
325 MG | ORAL | Status: DC | PRN
Start: 2018-07-14 — End: 2018-07-13

## 2018-07-14 MED ORDER — HYDROMORPHONE HCL 1 MG/ML IJ SOLN
1 MG/ML | Freq: Once | INTRAMUSCULAR | Status: AC | PRN
Start: 2018-07-14 — End: 2018-07-14
  Administered 2018-07-14: 05:00:00 1 mg via INTRAVENOUS

## 2018-07-14 MED ORDER — GLUCAGON HCL RDNA (DIAGNOSTIC) 1 MG IJ SOLR
1 MG | INTRAMUSCULAR | Status: DC | PRN
Start: 2018-07-14 — End: 2018-07-18

## 2018-07-14 MED ORDER — ACETAMINOPHEN 325 MG PO TABS
325 MG | ORAL | Status: DC | PRN
Start: 2018-07-14 — End: 2018-07-14
  Administered 2018-07-14: 09:00:00 650 mg via ORAL

## 2018-07-14 MED ORDER — OXYCODONE-ACETAMINOPHEN 5-325 MG PO TABS
5-325 MG | Freq: Once | ORAL | Status: AC
Start: 2018-07-14 — End: 2018-07-14
  Administered 2018-07-14: 20:00:00 1 via ORAL

## 2018-07-14 MED ORDER — LORAZEPAM 2 MG/ML IJ SOLN
2 MG/ML | Freq: Once | INTRAMUSCULAR | Status: AC | PRN
Start: 2018-07-14 — End: 2018-07-13

## 2018-07-14 MED ORDER — OXYCODONE-ACETAMINOPHEN 5-325 MG PO TABS
5-325 MG | ORAL | Status: DC | PRN
Start: 2018-07-14 — End: 2018-07-15
  Administered 2018-07-14 – 2018-07-15 (×4): 1 via ORAL

## 2018-07-14 MED ORDER — OXYCODONE-ACETAMINOPHEN 5-325 MG PO TABS
5-325 MG | ORAL | Status: DC | PRN
Start: 2018-07-14 — End: 2018-07-14
  Administered 2018-07-14: 03:00:00 1 via ORAL

## 2018-07-14 MED ORDER — LORAZEPAM 2 MG/ML IJ SOLN
2 MG/ML | Freq: Once | INTRAMUSCULAR | Status: AC | PRN
Start: 2018-07-14 — End: 2018-07-14
  Administered 2018-07-14: 16:00:00 2 mg via INTRAVENOUS

## 2018-07-14 MED FILL — VANCOMYCIN HCL 10 G IV SOLR: 10 g | INTRAVENOUS | Qty: 1000

## 2018-07-14 MED FILL — LORAZEPAM 2 MG/ML IJ SOLN: 2 mg/mL | INTRAMUSCULAR | Qty: 1

## 2018-07-14 MED FILL — METHOCARBAMOL 1000 MG/10ML IJ SOLN: 1000 MG/10ML | INTRAMUSCULAR | Qty: 10

## 2018-07-14 MED FILL — SODIUM CHLORIDE 0.9 % IV SOLN: 0.9 % | INTRAVENOUS | Qty: 1000

## 2018-07-14 MED FILL — DIAZEPAM 5 MG PO TABS: 5 mg | ORAL | Qty: 1

## 2018-07-14 MED FILL — METRONIDAZOLE 500 MG PO TABS: 500 mg | ORAL | Qty: 4

## 2018-07-14 MED FILL — VANCOMYCIN HCL 1 G IV SOLR: 1 g | INTRAVENOUS | Qty: 1000

## 2018-07-14 MED FILL — ACETAMINOPHEN 325 MG PO TABS: 325 mg | ORAL | Qty: 2

## 2018-07-14 MED FILL — HYDROMORPHONE HCL 1 MG/ML IJ SOLN: 1 mg/mL | INTRAMUSCULAR | Qty: 1

## 2018-07-14 MED FILL — PERCOCET 5-325 MG PO TABS: 5-325 mg | ORAL | Qty: 1

## 2018-07-14 MED FILL — IBUPROFEN 400 MG PO TABS: 400 mg | ORAL | Qty: 1

## 2018-07-14 MED FILL — CEFTRIAXONE SODIUM 2 G IJ SOLR: 2 g | INTRAMUSCULAR | Qty: 2

## 2018-07-14 MED FILL — DEXMEDETOMIDINE HCL 200 MCG/2ML IV SOLN: 200 MCG/2ML | INTRAVENOUS | Qty: 4

## 2018-07-14 MED FILL — MORPHINE SULFATE 2 MG/ML IJ SOLN: 2 mg/mL | INTRAMUSCULAR | Qty: 1

## 2018-07-14 MED FILL — TRAMADOL HCL 50 MG PO TABS: 50 mg | ORAL | Qty: 1

## 2018-07-14 MED FILL — ASPERCREME LIDOCAINE 4 % EX PTCH: 4 % | CUTANEOUS | Qty: 1
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
## 2018-07-14 NOTE — Consults (Signed)
NEUROSURGERY CONSULT NOTE    [name] [name]  [id number]   12-24-1980   07/14/2018    Requesting physician: Veda Canning, MD    Reason for consultation: Low back pain w/radiculopathy    History of present illness: Patient is a 37 y.o. female w/ PMH of Bipolar D/O, OCD, depression, anxiety, DM2, pituitary adenoma, HCV, C1-C2 fusion, polysubstance use d/o including cocaine and IV IVDU (heroin) use in the past 24 hours who presented on 07/13/2018 to [facility] c/o severe back pain in the lumbar area and in the left lumbar area radiating into the buttock area. Patient states acute low back pain started 4 days ago. States the pain was so severe today she could not walk. MRI Lumbar does not show any sign of abscess or severe stenosis. Patient was unable to lie still for cervical or thoracic scans.    ROS:   GENERAL: Endorses fever or recent illness. Denies weight changes   EYES: Denies vision change or diplopia  EARS: Denies hearing loss  CARDIAC: Denies chest pain  RESPIRATORY: Denies shortness of breath  SKIN: Denies rash or lesions   HEM: Denies excessive bruising  PSYCH: Denies anxiety or depression  NEURO: Denies headache, numbness or tingling or lateralizing weakness   GU: Denies urinary difficulty  GI: Endorses abdominal pain and nausea. Denies vomiting, diarrhea or constipation  MUSCULOSKELETAL: Endorses acute low back pain and chronic neck pain    Allergies   Allergen Reactions   ??? Latex Itching, Rash and Other (See Comments)     Burning to the touch    ??? Latex Rash       Past Medical History:   Diagnosis Date   ??? Anxiety    ??? Benign tumor of pituitary gland ([place])     ?cancer or not   ??? Bipolar affective disorder ([place])    ??? Cervical fusion syndrome    ??? Chronic pain    ??? Depression    ??? Gestational diabetes mellitus    ??? Hepatitis C 08/02/2017   ??? 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   ??? History of heroin abuse    ??? HPV in female    ??? Miscarriage    ??? 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 Neck pain    ???  OCD (obsessive compulsive disorder)    ??? Paranoia ([place])    ??? Tobacco abuse         Past Surgical History:   Procedure Laterality Date   ??? CERVICAL SPINE SURGERY      C1-C2 fusion 2003   ??? CESAREAN SECTION     ??? ECTOPIC PREGNANCY SURGERY         Social History     Occupational History   ??? Not on file   Tobacco Use   ??? Smoking status: Current Every Day Smoker     Packs/day: 0.50     Years: 11.00     Pack years: 5.50     Types: Cigarettes   ??? Smokeless tobacco: Never Used   Substance and Sexual Activity   ??? Alcohol use: Yes     Comment: every 2 months   ??? Drug use: Yes     Types: Cocaine, Opiates , Marijuana     Comment: 07/13/18 heroin, cocaine, marijuana   ??? Sexual activity: Not on file        Family History   Problem Relation Age of Onset   ??? Arthritis Other    ???  Asthma Other    ??? Cancer Other    ??? Diabetes Other    ??? Kidney Disease Other         No outpatient medications have been marked as taking for the 07/13/18 encounter [facility] Encounter).        Current Facility-Administered Medications   Medication Dose Route Frequency Provider Last Rate Last Dose   ??? methocarbamol (ROBAXIN) 1,000 mg in dextrose 5 % 100 mL IVPB  1,000 mg Intravenous Q8H Virgilio Belling Esteen Delpriore, APRN - CNP   Stopped at 07/14/18 1704    Followed by   ??? [START ON 07/15/2018] methocarbamol (ROBAXIN) tablet 750 mg  750 mg Oral 4x Daily Virgilio Belling Nazaret Chea, APRN - CNP       ??? diazepam (VALIUM) tablet 5 mg  5 mg Oral Q6H PRN Virgilio Belling Daymein Nunnery, APRN - CNP   5 mg at 07/14/18 1440   ??? dexmedetomidine (PRECEDEX) 400 mcg in sodium chloride 0.9 % 100 mL infusion  0.2 mcg/kg/hr Intravenous Continuous Geanie Berlin, MD 20.5 mL/hr at 07/14/18 1546 1.4 mcg/kg/hr at 07/14/18 1546   ??? traMADol (ULTRAM) tablet 50 mg  50 mg Oral Q6H PRN Geanie Berlin, MD   50 mg at 07/14/18 1604   ??? lidocaine 4 % external patch 1 patch  1 patch Transdermal Daily Virgilio Belling Tenlee Wollin, APRN - CNP   1 patch at 07/14/18 1042   ??? glucose (GLUTOSE) 40 % oral gel 15 g  15 g Oral PRN Veda Canning, MD       ??? dextrose 50 % IV solution  12.5 g Intravenous PRN Veda Canning, MD       ??? glucagon (rDNA) injection 1 mg  1 mg Intramuscular PRN Veda Canning, MD       ??? dextrose 5 % solution  100 mL/hr Intravenous PRN Veda Canning, MD       ??? mupirocin (BACTROBAN) 2 % ointment 1 g  1 g Nasal BID Carolan Clines, MD       ??? oxyCODONE-acetaminophen (PERCOCET) 5-325 MG per tablet 1 tablet  1 tablet Oral Q4H PRN Brenton Grills, MD       ??? cefepime (MAXIPIME) 2 g IVPB minibag  2 g Intravenous Q12H Brenton Grills, MD       ??? vancomycin (VANCOCIN) 1,000 mg in dextrose 5 % 250 mL IVPB  1,000 mg Intravenous Q8H Veda Canning, MD   Stopped at 07/14/18 1550   ??? sodium chloride flush 0.9 % injection 10 mL  10 mL Intravenous 2 times per day Carolan Clines, MD       ??? sodium chloride flush 0.9 % injection 10 mL  10 mL Intravenous PRN Carolan Clines, MD       ??? ondansetron (ZOFRAN) injection 4 mg  4 mg Intravenous Q6H PRN Carolan Clines, MD       ??? loperamide (IMODIUM) capsule 2 mg  2 mg Oral 4x Daily PRN Carolan Clines, MD            Objective:  BP (!) 152/93    Pulse 73    Temp 97.5 ??F (36.4 ??C) (Oral)    Resp 15    Ht 5\' 5"  (1.651 m)    Wt 129 lb (58.5 kg)    LMP 06/12/2018 (Approximate)    SpO2 98%    BMI 21.47 kg/m??     Physical Exam:   Patient seen and examined  GCS:  4 - Opens eyes on own  5 - Alert and oriented  6 - Follows simple motor commands  General: Well developed. Alert and cooperative in no acute distress.     HENT: atraumatic, neck supple  Eyes: Optic discs: Not tested  Pulmonary: unlabored respiratory effort  Cardiovascular:  Warm well perfused. No peripheral edema  Gastrointestinal: abdomen soft, NT, ND    Neurological:  Mental Status: Awake, alert, oriented x 4, speech clear and appropriate  Attention: Intact  Language: No aphasia or dysarthria noted  Sensation: Intact to all extremities to light touch  Coordination: Intact    Musculoskeletal:   Gait: Not tested   Assist devices: None   Tone:  Normal  Motor strength:    Right  Left    Right  Left    Deltoid  5 5  Hip Flex  4- 4-   Biceps  4- 4-  Knee Extensors  4- 4-   Triceps  4- 4-  Knee Flexors  4- 4-   Wrist Ext  4- 4-  Ankle Dorsiflex.  4- 4-   Wrist Flex  4- 4-  Ankle Plantarflex.  4- 4-   Handgrip  4- 4-  Ext Hal Longus  4- 4-   Thumb Ext  4- 4-         Radiological Findings:  Mri Lumbar Spine Wo Contrast  Addendum Date: 07/14/2018    Result Date: 07/14/2018  1. No evidence for infection involving lumbar spine  2. L4-L5 mild bilateral foraminal stenosis related to small foraminal disc herniations. No nerve root compression.   Addendum: There is some edema between the lower left psoas muscle and iliacus muscle incompletely evaluated on the lumbar spine pulse sequences. Infection is a possibility and could be better evaluated with dedicated pelvis MRI with/without contrast.    Mri Cervical Spine W Wo Contrast  Result Date: 07/14/2018  1. No evidence for cervical spine infection  2. Postoperative changes of suboccipital craniectomy and surgical fusion extending to C2   3. C5-C6 right uncovertebral spondylosis causing moderate right foraminal stenosis    Mri Thoracic Spine W Wo Contrast  Result Date: 07/14/2018  1. No evidence for infection involving the thoracic spine  2. Large T10 right foraminal perineural cyst projecting into the perivertebral space  3. Trace pleural effusions     Labs:  Recent Labs     07/14/18  0441   WBC 6.5   HGB 10.2*   HCT 31.0*   PLT 121*       Recent Labs     07/14/18  0441   NA 140   K 3.5   CL 104   CO2 25   BUN 7   CREATININE <0.5*   GLUCOSE 93   CALCIUM 8.4   MG 1.80       Recent Labs     07/14/18  0108   PROTIME 14.1*   INR 1.24*   APTT 29.9       Patient Active Problem List    Diagnosis Date Noted   ??? Acute midline low back pain without sciatica 07/14/2018   ??? IVDU (intravenous drug user) 07/14/2018   ??? Lumbar radiculopathy 07/13/2018   ??? Hyperchloremic metabolic acidosis 29/52/8413   ??? Elevated LFTs 08/02/2017   ???  Polysubstance abuse ([place]) 08/02/2017   ??? Abdominal pain 08/02/2017   ??? SIRS (systemic inflammatory response syndrome) ([place]) 08/01/2017   ??? Hypotension 08/01/2017   ??? Polysubstance (excluding opioids) dependence ([place]) 08/01/2017   ??? Sepsis ([place]) 08/01/2017  Assessment:  Patient is a 37 y.o. female w/4 days worsening lower back pain, upper & lower limb weakness and numbness and an episode of urinary incontinence.    Plan:  1. No emergent neurosurgical intervention indicated  2. Neurologic exams frequency:  - ICU: Q1H until otherwise directed  3. For change in exam MUST contact neurosurgery team along with critical care or primary team  4. Low Back Pain:  - MRI Cervical/Thoracic/Lumbar shows no evidence for infection or severe stenosis  - Edema between the lower left psoas muscle and iliacus muscle could possibly be infection; Primary team to complete w/u  5. Muscle spasms: IV Robaxin Q8H x3 doses then PO QID and PRN Valium  6. Pain control: Managed by medical team  7. PT/OT consulted, appreciate recs  8. Advance diet / activity per primary team  9. Thank you for consult. Will sign off.  Please call with any questions or decline in neurological status    DISPO: Dispo timing to be determined by primary team once patient is medically stable for discharge.    Patient was seen and examined with Dr. Amaryllis Dyke who agrees with above assessment and plan.     Electronically signed by: Vickii Penna, APRN-CNP, 07/14/2018 5:17 PM  [phone]
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
## 2018-07-14 NOTE — Plan of Care (Signed)
Problem: Pain:  Goal: Pain level will decrease  Description  Pain level will decrease  Outcome: Ongoing  Goal: Control of acute pain  Description  Control of acute pain  Outcome: Ongoing  Goal: Control of chronic pain  Description  Control of chronic pain  Outcome: Ongoing     Problem: Falls - Risk of:  Goal: Will remain free from falls  Description  Will remain free from falls  Outcome: Ongoing  Goal: Absence of physical injury  Description  Absence of physical injury  Outcome: Ongoing     Problem: INFECTION  Goal: Absence of infection signs and symptoms  Outcome: Ongoing

## 2018-07-14 NOTE — Other (Signed)
07/14/18 1522   Encounter Summary   Services provided to: Patient   Referral/Consult From: Rounding   Continue Visiting   (Seen 07/14/18, ALE. )   Complexity of Encounter Moderate   Length of Encounter 15 minutes   Routine   Type Initial   Assessment Approachable   Intervention Nurtured hope   Outcome Expressed gratitude

## 2018-07-14 NOTE — Care Coordination-Inpatient (Signed)
Case Management Notes:     Patient covers her head and does not respond to case manager.      Wyline Beady, RN  [facility]  Case Management Department  Ph: [phone]
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
## 2018-07-14 NOTE — Consults (Signed)
NEUROSURGERY CONSULT NOTE    [name] APPERSON  [id number]   1981/06/02   07/14/2018    Requesting physician: Veda Canning, MD    Reason for consultation: Low back pain w/radiculopathy    History of present illness: Patient is a 37 y.o. female w/ PMH of Bipolar D/O, OCD, depression, anxiety, DM2, pituitary adenoma, HCV, C1-C2 fusion, polysubstance use d/o including cocaine and IV IVDU (heroin) use in the past 24 hours who presented on 07/13/2018 to [facility] c/o severe back pain in the lumbar area and in the left lumbar area radiating into the buttock area. Patient states acute low back pain started 4 days ago. States the pain was so severe today she could not walk. MRI Lumbar does not show any sign of abscess or severe stenosis. Patient was unable to lie still for cervical or thoracic scans.    ROS:   GENERAL: Endorses fever or recent illness. Denies weight changes   EYES: Denies vision change or diplopia  EARS: Denies hearing loss  CARDIAC: Denies chest pain  RESPIRATORY: Denies shortness of breath  SKIN: Denies rash or lesions   HEM: Denies excessive bruising  PSYCH: Denies anxiety or depression  NEURO: Denies headache, numbness or tingling or lateralizing weakness   GU: Denies urinary difficulty  GI: Endorses abdominal pain and nausea. Denies vomiting, diarrhea or constipation  MUSCULOSKELETAL: Endorses acute low back pain and chronic neck pain    Allergies   Allergen Reactions   ??? Latex Itching, Rash and Other (See Comments)     Burning to the touch    ??? Latex Rash       Past Medical History:   Diagnosis Date   ??? Anxiety    ??? Benign tumor of pituitary gland ([place])     ?cancer or not   ??? Bipolar affective disorder ([place])    ??? Cervical fusion syndrome    ??? Chronic pain    ??? Depression    ??? Gestational diabetes mellitus    ??? Hepatitis C 08/02/2017   ??? 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   ??? History of heroin abuse    ??? HPV in female    ??? Miscarriage    ??? 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 Neck pain    ???  OCD (obsessive compulsive disorder)    ??? Paranoia ([place])    ??? Tobacco abuse         Past Surgical History:   Procedure Laterality Date   ??? CERVICAL SPINE SURGERY      C1-C2 fusion 2003   ??? CESAREAN SECTION     ??? ECTOPIC PREGNANCY SURGERY         Social History     Occupational History   ??? Not on file   Tobacco Use   ??? Smoking status: Current Every Day Smoker     Packs/day: 0.50     Years: 11.00     Pack years: 5.50     Types: Cigarettes   ??? Smokeless tobacco: Never Used   Substance and Sexual Activity   ??? Alcohol use: Yes     Comment: every 2 months   ??? Drug use: Yes     Types: Cocaine, Opiates , Marijuana     Comment: 07/13/18 heroin, cocaine, marijuana   ??? Sexual activity: Not on file        Family History   Problem Relation Age of Onset   ??? Arthritis Other    ???  Asthma Other    ??? Cancer Other    ??? Diabetes Other    ??? Kidney Disease Other         No outpatient medications have been marked as taking for the 07/13/18 encounter [facility] Encounter).        Current Facility-Administered Medications   Medication Dose Route Frequency Provider Last Rate Last Dose   ??? methocarbamol (ROBAXIN) 1,000 mg in dextrose 5 % 100 mL IVPB  1,000 mg Intravenous Q8H Virgilio Belling Rupard, APRN - CNP   Stopped at 07/14/18 1704    Followed by   ??? [START ON 07/15/2018] methocarbamol (ROBAXIN) tablet 750 mg  750 mg Oral 4x Daily Virgilio Belling Rupard, APRN - CNP       ??? diazepam (VALIUM) tablet 5 mg  5 mg Oral Q6H PRN Virgilio Belling Rupard, APRN - CNP   5 mg at 07/14/18 2028   ??? dexmedetomidine (PRECEDEX) 400 mcg in sodium chloride 0.9 % 100 mL infusion  0.2 mcg/kg/hr Intravenous Continuous Geanie Berlin, MD 20.5 mL/hr at 07/14/18 1900 1.4 mcg/kg/hr at 07/14/18 1900   ??? traMADol (ULTRAM) tablet 50 mg  50 mg Oral Q6H PRN Geanie Berlin, MD   50 mg at 07/14/18 1604   ??? lidocaine 4 % external patch 1 patch  1 patch Transdermal Daily Virgilio Belling Rupard, APRN - CNP   1 patch at 07/14/18 1042   ??? glucose (GLUTOSE) 40 % oral gel 15 g  15 g Oral PRN Veda Canning, MD       ??? dextrose 50 % IV solution  12.5 g Intravenous PRN Veda Canning, MD       ??? glucagon (rDNA) injection 1 mg  1 mg Intramuscular PRN Veda Canning, MD       ??? dextrose 5 % solution  100 mL/hr Intravenous PRN Veda Canning, MD       ??? mupirocin (BACTROBAN) 2 % ointment 1 g  1 g Nasal BID Carolan Clines, MD   1 g at 07/14/18 2219   ??? oxyCODONE-acetaminophen (PERCOCET) 5-325 MG per tablet 1 tablet  1 tablet Oral Q4H PRN Brenton Grills, MD   1 tablet at 07/14/18 2219   ??? cefepime (MAXIPIME) 2 g IVPB minibag  2 g Intravenous Q12H Brenton Grills, MD   Stopped at 07/14/18 2100   ??? vancomycin (VANCOCIN) 1,000 mg in dextrose 5 % 250 mL IVPB  1,000 mg Intravenous Q8H Veda Canning, MD 250 mL/hr at 07/14/18 2220 1,000 mg at 07/14/18 2220   ??? sodium chloride flush 0.9 % injection 10 mL  10 mL Intravenous 2 times per day Carolan Clines, MD       ??? sodium chloride flush 0.9 % injection 10 mL  10 mL Intravenous PRN Carolan Clines, MD       ??? ondansetron [facility]) injection 4 mg  4 mg Intravenous Q6H PRN Carolan Clines, MD       ??? loperamide (IMODIUM) capsule 2 mg  2 mg Oral 4x Daily PRN Carolan Clines, MD            Objective:  BP (!) 160/91    Pulse 67    Temp 97.9 ??F (36.6 ??C) (Oral)    Resp (!) 34    Ht 5\' 5"  (1.651 m)    Wt 129 lb (58.5 kg)    LMP 06/12/2018 (Approximate)    SpO2 98%    BMI 21.47 kg/m??     Physical Exam:   Patient  seen and examined  GCS:  4 - Opens eyes on own  5 - Alert and oriented  6 - Follows simple motor commands  General: Well developed. Alert and cooperative in no acute distress.     HENT: atraumatic, neck supple  Eyes: Optic discs: Not tested  Pulmonary: unlabored respiratory effort  Cardiovascular:  Warm well perfused. No peripheral edema  Gastrointestinal: abdomen soft, NT, ND    Neurological:  Mental Status: Awake, alert, oriented x 4, speech clear and appropriate  Attention: Intact  Language: No aphasia or dysarthria noted  Sensation: Intact to all extremities to light  touch  Coordination: Intact    Musculoskeletal:   Gait: Not tested   Assist devices: None   Tone: Normal  Motor strength:    Right  Left    Right  Left    Deltoid  5 5  Hip Flex  4- 4-   Biceps  4- 4-  Knee Extensors  4- 4-   Triceps  4- 4-  Knee Flexors  4- 4-   Wrist Ext  4- 4-  Ankle Dorsiflex.  4- 4-   Wrist Flex  4- 4-  Ankle Plantarflex.  4- 4-   Handgrip  4- 4-  Ext Hal Longus  4- 4-   Thumb Ext  4- 4-         Radiological Findings:    I personally reviewed the patient's imaging which consists of an MRI lumbar, cervical, and thoracic dated 07/14/18. These demonstrate no evidence of potential discitis or osteomyelitis. The patient does have mild multilevel spondylosis as well as edema in the left psoas and iliacus muscles. Notably, a large T10 right perineural cysts is also seen.       Labs:  Recent Labs     07/14/18  0441   WBC 6.5   HGB 10.2*   HCT 31.0*   PLT 121*       Recent Labs     07/14/18  0441   NA 140   K 3.5   CL 104   CO2 25   BUN 7   CREATININE <0.5*   GLUCOSE 93   CALCIUM 8.4   MG 1.80       Recent Labs     07/14/18  0108   PROTIME 14.1*   INR 1.24*   APTT 29.9       Patient Active Problem List    Diagnosis Date Noted   ??? Acute midline low back pain without sciatica 07/14/2018   ??? IVDU (intravenous drug user) 07/14/2018   ??? Lumbar radiculopathy 07/13/2018   ??? Hyperchloremic metabolic acidosis 95/18/8416   ??? Elevated LFTs 08/02/2017   ??? Polysubstance abuse ([place]) 08/02/2017   ??? Abdominal pain 08/02/2017   ??? SIRS (systemic inflammatory response syndrome) ([place]) 08/01/2017   ??? Hypotension 08/01/2017   ??? Polysubstance (excluding opioids) dependence ([place]) 08/01/2017   ??? Sepsis ([place]) 08/01/2017       Assessment:  Patient is a 37 y.o. female w/4 days worsening lower back pain, upper & lower limb weakness and numbness and an episode of urinary incontinence.    Plan:  1. No neurosurgical intervention indicated  2. Neurologic exams frequency:  - ICU: Q1H until otherwise directed  3. Low Back Pain:  - MRI  Cervical/Thoracic/Lumbar shows no evidence for infection or severe stenosis  - Edema between the lower left psoas muscle and iliacus muscle could possibly be infection; Primary team to complete w/u  4. Muscle spasms: IV Robaxin  Q8H x3 doses then PO QID and PRN Valium  5. Pain control: Managed by medical team    Thank you for the consultation.    Lillard Anes, MD, PhD  [facility]  [address] [place], [place], [address]  [phone] (c), [phone] (o)
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
## 2018-07-15 ENCOUNTER — Inpatient Hospital Stay: Admit: 2018-07-15 | Payer: PRIVATE HEALTH INSURANCE | Primary: Family Medicine

## 2018-07-15 LAB — POCT GLUCOSE
POC Glucose: 117 mg/dl — ABNORMAL HIGH (ref 70–99)
POC Glucose: 251 mg/dl — ABNORMAL HIGH (ref 70–99)
POC Glucose: 165 mg/dl — ABNORMAL HIGH (ref 70–99)

## 2018-07-15 LAB — COMPREHENSIVE METABOLIC PANEL W/ REFLEX TO MG FOR LOW K
ALT: 13 U/L (ref 10–40)
AST: 19 U/L (ref 15–37)
Albumin/Globulin Ratio: 0.7 — ABNORMAL LOW (ref 1.1–2.2)
Albumin: 2.8 g/dL — ABNORMAL LOW (ref 3.4–5.0)
Alkaline Phosphatase: 62 U/L (ref 40–129)
Anion Gap: 11 (ref 3–16)
BUN: 7 mg/dL (ref 7–20)
CO2: 21 mmol/L (ref 21–32)
Calcium: 8.7 mg/dL (ref 8.3–10.6)
Chloride: 108 mmol/L (ref 99–110)
Creatinine: <0.5 mg/dL — ABNORMAL LOW (ref 0.6–1.1)
GFR African American: >60 (ref >60)
GFR Non-African American: >60 (ref >60)
Globulin: 4.1 g/dL
Glucose: 154 mg/dL — ABNORMAL HIGH (ref 70–99)
Potassium reflex Magnesium: 3.9 mmol/L (ref 3.5–5.1)
Sodium: 140 mmol/L (ref 136–145)
Total Bilirubin: 0.3 mg/dL (ref 0.0–1.0)
Total Protein: 6.9 g/dL (ref 6.4–8.2)

## 2018-07-15 LAB — CULTURE, URINE: Urine Culture, Routine: NO GROWTH

## 2018-07-15 LAB — HEPATITIS PANEL, ACUTE
Hep A IgM: REACTIVE — AB
Hep B Core Ab, IgM: REACTIVE — AB
Hep B S Ag Interp: NONREACTIVE
Hep C Ab Interp: REACTIVE — AB

## 2018-07-15 LAB — C-REACTIVE PROTEIN: CRP: <0.3 mg/L (ref 0.0–5.1)

## 2018-07-15 LAB — VANCOMYCIN LEVEL, TROUGH: Vancomycin Tr: 9.9 ug/mL — ABNORMAL LOW (ref 10.0–20.0)

## 2018-07-15 MED ORDER — HYDROMORPHONE HCL 1 MG/ML IJ SOLN
1 MG/ML | INTRAMUSCULAR | Status: DC | PRN
Start: 2018-07-15 — End: 2018-07-15

## 2018-07-15 MED ORDER — INSULIN LISPRO 100 UNIT/ML SC SOPN
100 UNIT/ML | Freq: Three times a day (TID) | SUBCUTANEOUS | Status: DC
Start: 2018-07-15 — End: 2018-07-18

## 2018-07-15 MED ORDER — OXYCODONE-ACETAMINOPHEN 5-325 MG PO TABS
5-325 MG | ORAL | Status: DC | PRN
Start: 2018-07-15 — End: 2018-07-17
  Administered 2018-07-15 – 2018-07-17 (×11): 2 via ORAL

## 2018-07-15 MED ORDER — KETOROLAC TROMETHAMINE 30 MG/ML IJ SOLN
30 MG/ML | Freq: Once | INTRAMUSCULAR | Status: AC
Start: 2018-07-15 — End: 2018-07-14
  Administered 2018-07-15: 04:00:00 15 mg via INTRAVENOUS

## 2018-07-15 MED ORDER — LORAZEPAM 2 MG/ML IJ SOLN
2 MG/ML | Freq: Once | INTRAMUSCULAR | Status: AC | PRN
Start: 2018-07-15 — End: 2018-07-15
  Administered 2018-07-15: 15:00:00 2 mg via INTRAVENOUS

## 2018-07-15 MED ORDER — VANCOMYCIN HCL 1 G IV SOLR
1 g | Freq: Three times a day (TID) | INTRAVENOUS | Status: DC
Start: 2018-07-15 — End: 2018-07-18
  Administered 2018-07-15 – 2018-07-18 (×9): 1250 mg via INTRAVENOUS

## 2018-07-15 MED ORDER — HYDROMORPHONE HCL 1 MG/ML IJ SOLN
1 MG/ML | Freq: Once | INTRAMUSCULAR | Status: AC | PRN
Start: 2018-07-15 — End: 2018-07-15
  Administered 2018-07-15: 13:00:00 1 mg via INTRAVENOUS

## 2018-07-15 MED ORDER — HYDROMORPHONE HCL 1 MG/ML IJ SOLN
1 MG/ML | INTRAMUSCULAR | Status: DC | PRN
Start: 2018-07-15 — End: 2018-07-17
  Administered 2018-07-15 – 2018-07-17 (×13): 1 mg via INTRAVENOUS

## 2018-07-15 MED ORDER — LORAZEPAM 2 MG/ML IJ SOLN
2 MG/ML | Freq: Once | INTRAMUSCULAR | Status: AC | PRN
Start: 2018-07-15 — End: 2018-07-15
  Administered 2018-07-15: 13:00:00 1 mg via INTRAVENOUS

## 2018-07-15 MED ORDER — INSULIN LISPRO 100 UNIT/ML SC SOPN
100 UNIT/ML | Freq: Every evening | SUBCUTANEOUS | Status: DC
Start: 2018-07-15 — End: 2018-07-18
  Administered 2018-07-16: 01:00:00 1 [IU] via SUBCUTANEOUS

## 2018-07-15 MED ORDER — HYDROMORPHONE HCL 1 MG/ML IJ SOLN
1 MG/ML | INTRAMUSCULAR | Status: DC | PRN
Start: 2018-07-15 — End: 2018-07-17

## 2018-07-15 MED ORDER — GADOTERIDOL 279.3 MG/ML IV SOLN
279.3 MG/ML | Freq: Once | INTRAVENOUS | Status: AC | PRN
Start: 2018-07-15 — End: 2018-07-15
  Administered 2018-07-15: 14:00:00 10 mL via INTRAVENOUS

## 2018-07-15 MED ORDER — HYDROMORPHONE HCL 1 MG/ML IJ SOLN
1 MG/ML | Freq: Once | INTRAMUSCULAR | Status: AC | PRN
Start: 2018-07-15 — End: 2018-07-15
  Administered 2018-07-15: 15:00:00 1 mg via INTRAVENOUS

## 2018-07-15 MED FILL — ASPERCREME LIDOCAINE 4 % EX PTCH: 4 % | CUTANEOUS | Qty: 1

## 2018-07-15 MED FILL — HUMALOG KWIKPEN 100 UNIT/ML SC SOPN: 100 [IU]/mL | SUBCUTANEOUS | Qty: 3

## 2018-07-15 MED FILL — PERCOCET 5-325 MG PO TABS: 5-325 mg | ORAL | Qty: 1

## 2018-07-15 MED FILL — METHOCARBAMOL 750 MG PO TABS: 750 mg | ORAL | Qty: 1

## 2018-07-15 MED FILL — VANCOMYCIN HCL 10 G IV SOLR: 10 g | INTRAVENOUS | Qty: 1250

## 2018-07-15 MED FILL — HYDROMORPHONE HCL 1 MG/ML IJ SOLN: 1 mg/mL | INTRAMUSCULAR | Qty: 1

## 2018-07-15 MED FILL — VANCOMYCIN HCL 10 G IV SOLR: 10 g | INTRAVENOUS | Qty: 1000

## 2018-07-15 MED FILL — CEFEPIME HCL 2 G IJ SOLR: 2 g | INTRAMUSCULAR | Qty: 2

## 2018-07-15 MED FILL — PERCOCET 5-325 MG PO TABS: 5-325 mg | ORAL | Qty: 2

## 2018-07-15 MED FILL — TRAMADOL HCL 50 MG PO TABS: 50 mg | ORAL | Qty: 1

## 2018-07-15 MED FILL — METHOCARBAMOL 1000 MG/10ML IJ SOLN: 1000 MG/10ML | INTRAMUSCULAR | Qty: 10

## 2018-07-15 MED FILL — DEXMEDETOMIDINE HCL 200 MCG/2ML IV SOLN: 200 MCG/2ML | INTRAVENOUS | Qty: 4

## 2018-07-15 MED FILL — DIAZEPAM 5 MG PO TABS: 5 mg | ORAL | Qty: 1

## 2018-07-15 MED FILL — MUPIROCIN 2 % EX OINT: 2 % | CUTANEOUS | Qty: 22

## 2018-07-15 MED FILL — KETOROLAC TROMETHAMINE 30 MG/ML IJ SOLN: 30 mg/mL | INTRAMUSCULAR | Qty: 1

## 2018-07-15 MED FILL — LORAZEPAM 2 MG/ML IJ SOLN: 2 mg/mL | INTRAMUSCULAR | Qty: 1
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
## 2018-07-15 NOTE — Progress Notes (Signed)
Infectious Disease Follow up Notes    CC :  IVDU, back pain      Antibiotics:   Cefepime 2g q12  vanc 1250 q8     Admit Date:   07/13/2018  Hospital Day: 3    Subjective:   Low grade fever overnight.  Today, afebrile, on room air   Continued low back pain most pronounced at or near L SI joint.  Pain with any movement of the L leg.  Foley out; she is voiding without difficulty.      Objective:     Patient Vitals for the past 8 hrs:   BP Temp Temp src Pulse Resp SpO2   07/15/18 1416 135/83 97.7 ??F (36.5 ??C) Axillary -- -- --   07/15/18 1415 -- -- -- 66 28 99 %   07/15/18 1400 135/83 -- -- 63 25 100 %   07/15/18 1345 -- -- -- 63 24 100 %   07/15/18 1330 -- -- -- 64 19 100 %   07/15/18 1315 -- -- -- 64 20 100 %   07/15/18 1311 (!) 142/95 -- -- 67 22 --   07/15/18 1300 -- -- -- 67 29 --   07/15/18 1245 -- -- -- 65 21 --   07/15/18 1230 -- -- -- 64 22 --   07/15/18 1215 -- -- -- 66 20 --   07/15/18 1200 124/83 -- -- 58 22 --   07/15/18 1100 (!) 166/102 -- -- 52 26 99 %   07/15/18 1045 -- -- -- 55 28 100 %   07/15/18 1044 (!) 164/100 -- -- 54 21 100 %   07/15/18 1030 -- -- -- 52 24 99 %   07/15/18 1015 -- -- -- 55 23 99 %   07/15/18 0930 -- -- -- -- -- 100 %   07/15/18 0915 -- -- -- 51 26 --   07/15/18 0900 (!) 160/96 -- -- (!) 49 17 --       EXAM:  General:  Easily roused.  Looks uncomfortable    HEENT:  PERRL, sclera anicteric.  No conjunctival lesions.    NECK:  supple  LUNGS:  CTA bil, no W/R/R    CV:  RRR without murmur   ABD: Soft, NT, +BS     EXT: Numerous track marks.  Fluctuance without erythema R arm, minimally tender    Decreased strength L leg, hard to knownif effort limited 2/2 to pain induced      LINE: PIV site ok       Scheduled Meds:  ??? vancomycin  1,250 mg Intravenous Q8H   ??? methocarbamol  750 mg Oral 4x Daily   ??? lidocaine  1 patch Transdermal Daily   ??? mupirocin  1 g Nasal BID   ??? cefepime  2 g Intravenous Q12H   ??? sodium  chloride flush  10 mL Intravenous 2 times per day       Continuous Infusions:  ??? dexmedetomidine (PRECEDEX) IV infusion 1.4 mcg/kg/hr (07/15/18 1158)   ??? dextrose            Data Review:    Lab Results   Component Value Date    WBC 6.5 07/14/2018    HGB 10.2 (L) 07/14/2018    HCT 31.0 (L) 07/14/2018    MCV 85.8 07/14/2018    PLT 121 (L) 07/14/2018     Lab Results   Component Value Date    CREATININE <0.5 (L) 07/15/2018    BUN 7 07/15/2018  NA 140 07/15/2018    K 3.9 07/15/2018    CL 108 07/15/2018    CO2 21 07/15/2018       Hepatic Function Panel:   Lab Results   Component Value Date    ALKPHOS 62 07/15/2018    ALT 13 07/15/2018    AST 19 07/15/2018    PROT 6.9 07/15/2018    PROT 7.1 01/17/2010    BILITOT 0.3 07/15/2018    LABALBU 2.8 07/15/2018         MICRO:  9/12 BC x2 NGTD   UC NGTD (UA )   Urine GC/CT probes neg   9/13 HIV screen neg       IMAGING:  MRI Pelvis 9/14:  Impression   1. Findings which are compatible with a myositis involving the iliac images, piriformis, and gluteus maximus muscles, presumably infectious in the setting of IV drug abuse. This abnormality extends outwards from the pelvis through the sciatic notch.   2. Effusion in the left sacroiliac joint, adjacent to muscle edema for which sterility is indeterminate. There are also small hip joint effusions.   3. Within the pelvis, the edema extends medially, and encases the left iliac vascular bundle.   4. Moderate degree of free fluid in the pelvis.     MRI CTL spine neg though "there is some edema between the lower left psoas muscle and iliacus muscle incompletely evaluated on the lumbar spine pulse sequences. Infection is a possibility and could be better evaluated with dedicated pelvis MRI with/without contrast."    TTE 9/13:  ??Summary  ??Normal left ventricle size, wall thickness, and systolic function with an  ??estimated ejection fraction of 55-60%. No regional wall motion abnormalities  ??are seen.  ??Trace mitral regurgitation is  present.  ??Trivial tricuspid regurgitation. There appears to be a very small mobile  ??vegetation attached to the atrial aspect of the tricuspid valve.  ??Pulmonary artery systolic pressure is estimated at 31 mmHg assuming a right  ??atrial pressure of 3 mmHg.    Assessment:     Patient Active Problem List    Diagnosis Date Noted   ??? Acute midline low back pain without sciatica 07/14/2018   ??? IVDU (intravenous drug user) 07/14/2018   ??? Lumbar radiculopathy 07/13/2018   ??? Hyperchloremic metabolic acidosis 25/85/2778   ??? Elevated LFTs 08/02/2017   ??? Polysubstance abuse ([place]) 08/02/2017   ??? Abdominal pain 08/02/2017   ??? SIRS (systemic inflammatory response syndrome) ([place]) 08/01/2017   ??? Hypotension 08/01/2017   ??? Polysubstance (excluding opioids) dependence ([place]) 08/01/2017   ??? Sepsis ([place]) 08/01/2017     IVDU     Admitted 07/13/18 with acute on chronic low back, worse x 1 week   Elevated ESR  MRI evidence of myositis (iliac, piriformis, gluteus), effusion, possible septic arthritis L sacroiliac; as well as bil hip effusions   TV vegetation suspected on TTE   BC are running negative so far     HIV screen neg 07/14/18  Trichomoniasis by UA, for which she got a dose of flagyl   HCV ab positive   HAV IgM and HBc IgM are both positive with neg HBs ag.  LFTs are normal     MRSA nasal screen positive 08/2017     No abx allergies     Plan:   She appears to have a widely disseminated infection.  There is no guiding micro data.  Most likely pathogen S aureus, though gram s, disseminated fungal/candida infection in this context  also possible     ?Aspiration of the SI joint   Ortho was asked to see her     Will continue vanc and cefepime and monitor clinically.  Follow trend inflammatory markers  Vanc trough was low in this young woman with normal renal function.  Pharmacy is dosing     TEE at some point  She is going to need weeks of IV abx       Discussed with patient/family, all questions answered  D/w RN       Barry Brunner,  MD  Phone: [phone]   Fax : [phone]

## 2018-07-16 LAB — COMPREHENSIVE METABOLIC PANEL W/ REFLEX TO MG FOR LOW K
ALT: 12 U/L (ref 10–40)
AST: 14 U/L — ABNORMAL LOW (ref 15–37)
Albumin/Globulin Ratio: 0.8 — ABNORMAL LOW (ref 1.1–2.2)
Albumin: 2.9 g/dL — ABNORMAL LOW (ref 3.4–5.0)
Alkaline Phosphatase: 60 U/L (ref 40–129)
Anion Gap: 12 (ref 3–16)
BUN: 8 mg/dL (ref 7–20)
CO2: 24 mmol/L (ref 21–32)
Calcium: 8.4 mg/dL (ref 8.3–10.6)
Chloride: 105 mmol/L (ref 99–110)
Creatinine: <0.5 mg/dL — ABNORMAL LOW (ref 0.6–1.1)
GFR African American: >60 (ref >60)
GFR Non-African American: >60 (ref >60)
Globulin: 3.6 g/dL
Glucose: 118 mg/dL — ABNORMAL HIGH (ref 70–99)
Potassium reflex Magnesium: 3.7 mmol/L (ref 3.5–5.1)
Sodium: 141 mmol/L (ref 136–145)
Total Bilirubin: <0.2 mg/dL (ref 0.0–1.0)
Total Protein: 6.5 g/dL (ref 6.4–8.2)

## 2018-07-16 LAB — CBC WITH AUTO DIFFERENTIAL
Basophils %: 0.6 %
Basophils Absolute: 0 10*3/uL (ref 0.0–0.2)
Eosinophils %: 1.1 %
Eosinophils Absolute: 0.1 10*3/uL (ref 0.0–0.6)
Hematocrit: 32.5 % — ABNORMAL LOW (ref 36.0–48.0)
Hemoglobin: 10.7 g/dL — ABNORMAL LOW (ref 12.0–16.0)
Lymphocytes %: 34.5 %
Lymphocytes Absolute: 2.2 10*3/uL (ref 1.0–5.1)
MCH: 27.8 pg (ref 26.0–34.0)
MCHC: 33 g/dL (ref 31.0–36.0)
MCV: 84.3 fL (ref 80.0–100.0)
MPV: 8.4 fL (ref 5.0–10.5)
Monocytes %: 7.7 %
Monocytes Absolute: 0.5 10*3/uL (ref 0.0–1.3)
Neutrophils %: 56.1 %
Neutrophils Absolute: 3.6 10*3/uL (ref 1.7–7.7)
Platelets: 193 10*3/uL (ref 135–450)
RBC: 3.86 M/uL — ABNORMAL LOW (ref 4.00–5.20)
RDW: 14.6 % (ref 12.4–15.4)
WBC: 6.4 10*3/uL (ref 4.0–11.0)

## 2018-07-16 LAB — SEDIMENTATION RATE: Sed Rate, Automated: 65 mm/h — ABNORMAL HIGH (ref 0–20)

## 2018-07-16 LAB — POCT GLUCOSE
POC Glucose: 186 mg/dl — ABNORMAL HIGH (ref 70–99)
POC Glucose: 105 mg/dl — ABNORMAL HIGH (ref 70–99)
POC Glucose: 115 mg/dl — ABNORMAL HIGH (ref 70–99)
POC Glucose: 98 mg/dl (ref 70–99)

## 2018-07-16 LAB — HEMOGLOBIN A1C
Estimated Avg Glucose: 139.9 mg/dL
Hemoglobin A1C: 6.5 %

## 2018-07-16 LAB — T4, FREE: T4 Free: 1.4 ng/dL (ref 0.9–1.8)

## 2018-07-16 LAB — VANCOMYCIN LEVEL, TROUGH: Vancomycin Tr: 16.9 ug/mL (ref 10.0–20.0)

## 2018-07-16 LAB — C-REACTIVE PROTEIN: CRP: 67.1 mg/L — ABNORMAL HIGH (ref 0.0–5.1)

## 2018-07-16 MED FILL — METHOCARBAMOL 750 MG PO TABS: 750 mg | ORAL | Qty: 1

## 2018-07-16 MED FILL — HYDROMORPHONE HCL 1 MG/ML IJ SOLN: 1 mg/mL | INTRAMUSCULAR | Qty: 1

## 2018-07-16 MED FILL — TRAMADOL HCL 50 MG PO TABS: 50 mg | ORAL | Qty: 1

## 2018-07-16 MED FILL — PERCOCET 5-325 MG PO TABS: 5-325 mg | ORAL | Qty: 2

## 2018-07-16 MED FILL — DEXMEDETOMIDINE HCL 200 MCG/2ML IV SOLN: 200 MCG/2ML | INTRAVENOUS | Qty: 4

## 2018-07-16 MED FILL — DIAZEPAM 5 MG PO TABS: 5 mg | ORAL | Qty: 1

## 2018-07-16 MED FILL — CEFEPIME HCL 2 G IJ SOLR: 2 g | INTRAMUSCULAR | Qty: 2

## 2018-07-16 MED FILL — VANCOMYCIN HCL 10 G IV SOLR: 10 g | INTRAVENOUS | Qty: 1250

## 2018-07-16 MED FILL — ASPERCREME LIDOCAINE 4 % EX PTCH: 4 % | CUTANEOUS | Qty: 1

## 2018-07-16 NOTE — Progress Notes (Signed)
One time doses of Ativan and Ketorolac given as ordered. Will cont to monitor.

## 2018-07-16 NOTE — Consults (Signed)
[facility]  Consult Note        This patient is seen in consultation at the request of Dr Carolan Clines, MD    Reason for Consult:  Left posterior hip pain/ Myositis.    CHIEF COMPLAINT:  Left posterior hip pain.    History Obtained From:  patient, electronic medical record    HISTORY OF PRESENT ILLNESS:    [name] [name] is a 37 y.o. year old female with h/o IVD who is seen today for consultation and evaluation of left posterior hip pain that radiate to the leg.  she reports that this started about a week ago with no specific injury that started the pain.  She reports that the pain is worse with activity and better with rest. She was admitted to [facility] for worsening left lower extremity weakness, transferred to ICU for emergent lumbar spine imaging with her history of IV drug use for concern of spinal abscess.  MRI spine was negative for infection, MRI pelvis reported as myositis left hip with left SI effusion.    Past Medical History:        Diagnosis Date   ??? Anxiety    ??? Benign tumor of pituitary gland ([place])     ?cancer or not   ??? Bipolar affective disorder ([place])    ??? Cervical fusion syndrome    ??? Chronic pain    ??? Depression    ??? Gestational diabetes mellitus    ??? Hepatitis C 08/02/2017   ??? 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   ??? History of heroin abuse    ??? HPV in female    ??? Miscarriage    ??? 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 Neck pain    ??? OCD (obsessive compulsive disorder)    ??? Paranoia ([place])    ??? Tobacco abuse        Past Surgical History:        Procedure Laterality Date   ??? CERVICAL SPINE SURGERY      C1-C2 fusion 2003   ??? CESAREAN SECTION     ??? ECTOPIC PREGNANCY SURGERY         Medications prior to admission:   Prior to Admission medications    Not on File       Current Medications:   Current Facility-Administered Medications: vancomycin (VANCOCIN) 1,250 mg in dextrose 5 % 250 mL IVPB, 1,250 mg, Intravenous, Q8H  oxyCODONE-acetaminophen (PERCOCET) 5-325 MG per tablet 2 tablet, 2 tablet, Oral,  Q4H PRN  HYDROmorphone (DILAUDID) injection 0.5 mg, 0.5 mg, Intravenous, Q3H PRN **OR** HYDROmorphone (DILAUDID) injection 1 mg, 1 mg, Intravenous, Q3H PRN  insulin lispro (HUMALOG) injection pen 0-6 Units, 0-6 Units, Subcutaneous, TID WC  insulin lispro (HUMALOG) injection pen 0-3 Units, 0-3 Units, Subcutaneous, Nightly  [COMPLETED] methocarbamol (ROBAXIN) 1,000 mg in dextrose 5 % 100 mL IVPB, 1,000 mg, Intravenous, Q8H **FOLLOWED BY** methocarbamol (ROBAXIN) tablet 750 mg, 750 mg, Oral, 4x Daily  diazepam (VALIUM) tablet 5 mg, 5 mg, Oral, Q6H PRN  dexmedetomidine (PRECEDEX) 400 mcg in sodium chloride 0.9 % 100 mL infusion, 0.2 mcg/kg/hr, Intravenous, Continuous  traMADol (ULTRAM) tablet 50 mg, 50 mg, Oral, Q6H PRN  lidocaine 4 % external patch 1 patch, 1 patch, Transdermal, Daily  glucose (GLUTOSE) 40 % oral gel 15 g, 15 g, Oral, PRN  dextrose 50 % IV solution, 12.5 g, Intravenous, PRN  glucagon (rDNA) injection 1 mg, 1 mg, Intramuscular, PRN  dextrose 5 % solution, 100 mL/hr,  Intravenous, PRN  mupirocin (BACTROBAN) 2 % ointment 1 g, 1 g, Nasal, BID  cefepime (MAXIPIME) 2 g IVPB minibag, 2 g, Intravenous, Q12H  sodium chloride flush 0.9 % injection 10 mL, 10 mL, Intravenous, 2 times per day  sodium chloride flush 0.9 % injection 10 mL, 10 mL, Intravenous, PRN  ondansetron (ZOFRAN) injection 4 mg, 4 mg, Intravenous, Q6H PRN  loperamide (IMODIUM) capsule 2 mg, 2 mg, Oral, 4x Daily PRN    Allergies:  Latex and Latex    Social History     Socioeconomic History   ??? Marital status: Divorced     Spouse name: Not on file   ??? Number of children: 1   ??? Years of education: Not on file   ??? Highest education level: Not on file   Occupational History   ??? Not on file   Social Needs   ??? Financial resource strain: Not on file   ??? Food insecurity:     Worry: Not on file     Inability: Not on file   ??? Transportation needs:     Medical: Not on file     Non-medical: Not on file   Tobacco Use   ??? Smoking status: Current Every Day  Smoker     Packs/day: 0.50     Years: 11.00     Pack years: 5.50     Types: Cigarettes   ??? Smokeless tobacco: Never Used   Substance and Sexual Activity   ??? Alcohol use: Yes     Comment: every 2 months   ??? Drug use: Yes     Types: Cocaine, Opiates , Marijuana     Comment: 07/13/18 heroin, cocaine, marijuana   ??? Sexual activity: Not on file   Lifestyle   ??? Physical activity:     Days per week: Not on file     Minutes per session: Not on file   ??? Stress: Not on file   Relationships   ??? Social connections:     Talks on phone: Not on file     Gets together: Not on file     Attends religious service: Not on file     Active member of club or organization: Not on file     Attends meetings of clubs or organizations: Not on file     Relationship status: Not on file   ??? Intimate partner violence:     Fear of current or ex partner: Not on file     Emotionally abused: Not on file     Physically abused: Not on file     Forced sexual activity: Not on file   Other Topics Concern   ??? Not on file   Social History Narrative    ** Merged History Encounter **            Family History:  Family History   Problem Relation Age of Onset   ??? Arthritis Other    ??? Asthma Other    ??? Cancer Other    ??? Diabetes Other    ??? Kidney Disease Other        REVIEW OF SYSTEMS:   CONSTITUTIONAL: Denies unexplained weight loss, fevers, chills or fatigue  NEUROLOGICAL: Denies unsteady gait or progressive weakness    PSYCHOLOGICAL: Denies anxiety, depression   SKIN: Denies skin changes, delayed healing, rash, itching   HEMATOLOGIC: Denies easy bleeding or bruising  ENDOCRINE: Denies excessive thirst, urination, heat/cold  RESPIRATORY: Denies current dyspnea, cough  CARDIOVASCULAR: Negative  for chest pain at this time.  EYES: Negative for photophobia and visual disturbance.   ENT:  Negative for rhinorrhea, epistaxis, sore throat, or hearing loss.  GI: Denies nausea, vomiting, diarrhea   GU: Denies bowel or bladder issues   MUSCULOSKELETAL: Left posterior hip  pain  All other ROS reviewed in chart or with patient or family and are grossly negative.       PHYSICAL EXAMINATION:  [name] [name] is a very pleasant 37 y.o. female who seen today in no acute distress, awake, alert, and oriented.  She is well nourished and groomed.  Patient with normal affect. Body mass index is 21.47 kg/m??.. Skin warm and dry. Resting respiratory rate is 16.  Resp deep and easy. Pulse is with regular rate and rhythm    BP (!) 147/98    Pulse 56    Temp 98.1 ??F (36.7 ??C) (Oral)    Resp 22    Ht 5\' 5"  (1.651 m)    Wt 129 lb (58.5 kg)    LMP 06/12/2018 (Approximate)    SpO2 100%    BMI 21.47 kg/m??        Airway is intact  Chest: chest clear, no wheezing, rales, normal symmetric air entry, no tachypnea, retractions or cyanosis  Heart: regular rate and rhythm ; heart sounds normal   Hearing intact, pupil equal and reactive bilateral  Lymphatics; No groin or axillary enlarged lymph nodes.  Neck; No swelling  Abdomen; soft, non distended.     MUSCULOSKELETAL:   Examination of both knees and hips showing decrease ROM left hip, minimal pain with leg roll.  She has decrease sensation and good pedal pulses.  Knee reflex 1+ bilaterally. She is nontender to palpation at the lateral aspect of either hip overlying the greater trochanteric bursa, but tender over left SI area.   Motor strength is 4/5 left lower extremities.  She has a positive straight leg raise at 60 degree. The skin overlying the left hip is intact without evidence of scar, lesion, laceration or abrasion.  Distal pulses are 2+ and symmetric bilaterally.     NEUROLOGIC:   Sensory:    Touch:                     Right Upper Extremity:  normal                   Left Upper Extremity:  normal                  Right Lower Extremity:  normal                  Left Lower Extremity:  Decrease sensation left leg        DATA:    CBC:   Lab Results   Component Value Date    WBC 6.4 07/16/2018    RBC 3.86 07/16/2018    HGB 10.7 07/16/2018    HCT 32.5  07/16/2018    MCV 84.3 07/16/2018    MCH 27.8 07/16/2018    MCHC 33.0 07/16/2018    RDW 14.6 07/16/2018    PLT 193 07/16/2018    MPV 8.4 07/16/2018     WBC:    Lab Results   Component Value Date    WBC 6.4 07/16/2018     PT/INR:    Lab Results   Component Value Date    PROTIME 14.1 07/14/2018    INR 1.24 07/14/2018  PTT:    Lab Results   Component Value Date    APTT 29.9 07/14/2018   [APTT    IMAGING: MRI pelvis 07/15/2018   1. Findings which are compatible with a myositis involving the iliac images, piriformis, and gluteus maximus muscles, presumably infectious in the setting of IV drug abuse. This abnormality extends outwards from the pelvis through the sciatic notch.   2. Effusion in the left sacroiliac joint, adjacent to muscle edema for which sterility is indeterminate. There are also small hip joint effusions.   3. Within the pelvis, the edema extends medially, and encases the left iliac vascular bundle.   4. Moderate degree of free fluid in the pelvis.       IMPRESSION: Left posterior hip pain/ Myositis, no fluid collection.      PLAN:  I discussed with Charlese Gruetzmacher the treatment options and that we can try to treat this non-operatively with IV ABX. I don't believe she has left septic hip as her MRI showed minimal to no fluid in hip joint, minimal effusion SI joint    I recommend IV ABX and pain control. No indication for Orthopedic surgical intervention regarding her left hip joint.    Thank you very much for the kind consultation and allowing me to participate in this patient's care.  I will continue to keep you apprised of her progress.        Vevelyn Francois, MD   07/16/2018  9:13 AM
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
## 2018-07-16 NOTE — Plan of Care (Signed)
Problem: Pain:  Goal: Pain level will decrease  Description  Pain level will decrease  Outcome: Ongoing  Pt c/o pain to hip, back, and buttocks. PRN pain medications given as ordered. Pt resting between doses. Cont to attempt to titrate down on Precedex gtt.    Problem: Falls - Risk of:  Goal: Will remain free from falls  Description  Will remain free from falls  Outcome: Ongoing  Pt is fall risk. Instructed to call for assistance with needs. Call light in reach. Bed in lowest locked position, side rails up x 3, bed alarm on. Will cont to monitor.     Problem: INFECTION  Goal: Absence of infection signs and symptoms  Outcome: Ongoing

## 2018-07-16 NOTE — Progress Notes (Signed)
Pt c/o pain being unbearable to L hip and lower back. Restless in bed. All PRN's given as ordered. MD notified of pain. Instructed to increase Precedex gtt if needed.

## 2018-07-16 NOTE — Progress Notes (Signed)
Epic downtime ended. Please refer to paper chart.
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
## 2018-07-16 NOTE — Progress Notes (Signed)
Pt in bed, tense and crying in pain. Rating pain 10/10. MD called to bedside.

## 2018-07-17 LAB — POCT GLUCOSE
POC Glucose: 119 mg/dl — ABNORMAL HIGH (ref 70–99)
POC Glucose: 88 mg/dl (ref 70–99)
POC Glucose: 90 mg/dl (ref 70–99)
POC Glucose: 96 mg/dl (ref 70–99)

## 2018-07-17 LAB — COMPREHENSIVE METABOLIC PANEL W/ REFLEX TO MG FOR LOW K
ALT: 8 U/L — ABNORMAL LOW (ref 10–40)
AST: 10 U/L — ABNORMAL LOW (ref 15–37)
Albumin/Globulin Ratio: 0.7 — ABNORMAL LOW (ref 1.1–2.2)
Albumin: 2.8 g/dL — ABNORMAL LOW (ref 3.4–5.0)
Alkaline Phosphatase: 61 U/L (ref 40–129)
Anion Gap: 11 (ref 3–16)
BUN: 9 mg/dL (ref 7–20)
CO2: 27 mmol/L (ref 21–32)
Calcium: 8.5 mg/dL (ref 8.3–10.6)
Chloride: 104 mmol/L (ref 99–110)
Creatinine: 0.5 mg/dL — ABNORMAL LOW (ref 0.6–1.1)
GFR African American: >60 (ref >60)
GFR Non-African American: >60 (ref >60)
Globulin: 3.8 g/dL
Glucose: 113 mg/dL — ABNORMAL HIGH (ref 70–99)
Potassium reflex Magnesium: 4.1 mmol/L (ref 3.5–5.1)
Sodium: 142 mmol/L (ref 136–145)
Total Bilirubin: 0.3 mg/dL (ref 0.0–1.0)
Total Protein: 6.6 g/dL (ref 6.4–8.2)

## 2018-07-17 LAB — CBC WITH AUTO DIFFERENTIAL
Basophils %: 0.7 %
Basophils Absolute: 0 10*3/uL (ref 0.0–0.2)
Eosinophils %: 1.4 %
Eosinophils Absolute: 0.1 10*3/uL (ref 0.0–0.6)
Hematocrit: 33.8 % — ABNORMAL LOW (ref 36.0–48.0)
Hemoglobin: 11 g/dL — ABNORMAL LOW (ref 12.0–16.0)
Lymphocytes %: 40.1 %
Lymphocytes Absolute: 2.5 10*3/uL (ref 1.0–5.1)
MCH: 27.5 pg (ref 26.0–34.0)
MCHC: 32.5 g/dL (ref 31.0–36.0)
MCV: 84.6 fL (ref 80.0–100.0)
MPV: 7.8 fL (ref 5.0–10.5)
Monocytes %: 7.4 %
Monocytes Absolute: 0.5 10*3/uL (ref 0.0–1.3)
Neutrophils %: 50.4 %
Neutrophils Absolute: 3.1 10*3/uL (ref 1.7–7.7)
Platelets: 231 10*3/uL (ref 135–450)
RBC: 4 M/uL (ref 4.00–5.20)
RDW: 14.5 % (ref 12.4–15.4)
WBC: 6.2 10*3/uL (ref 4.0–11.0)

## 2018-07-17 LAB — C-REACTIVE PROTEIN: CRP: 37.1 mg/L — ABNORMAL HIGH (ref 0.0–5.1)

## 2018-07-17 LAB — SEDIMENTATION RATE: Sed Rate, Automated: 48 mm/h — ABNORMAL HIGH (ref 0–20)

## 2018-07-17 MED ORDER — ENOXAPARIN SODIUM 40 MG/0.4ML SC SOLN
40 MG/0.4ML | Freq: Every day | SUBCUTANEOUS | Status: DC
Start: 2018-07-17 — End: 2018-07-18
  Administered 2018-07-17: 17:00:00 40 mg via SUBCUTANEOUS

## 2018-07-17 MED ORDER — HYDROCODONE-ACETAMINOPHEN 5-325 MG PO TABS
5-325 MG | ORAL | Status: DC | PRN
Start: 2018-07-17 — End: 2018-07-18
  Administered 2018-07-18 (×3): 2 via ORAL

## 2018-07-17 MED ORDER — SODIUM CHLORIDE 0.9 % IV SOLN
0.9 % | INTRAVENOUS | Status: AC
Start: 2018-07-17 — End: 2018-07-17

## 2018-07-17 MED ORDER — LORAZEPAM 2 MG/ML IJ SOLN
2 MG/ML | Freq: Once | INTRAMUSCULAR | Status: AC
Start: 2018-07-17 — End: 2018-07-16
  Administered 2018-07-17: 02:00:00 1 mg via INTRAVENOUS

## 2018-07-17 MED ORDER — HYDROMORPHONE HCL 1 MG/ML IJ SOLN
1 MG/ML | INTRAMUSCULAR | Status: DC | PRN
Start: 2018-07-17 — End: 2018-07-18
  Administered 2018-07-17 – 2018-07-18 (×5): 1 mg via INTRAVENOUS

## 2018-07-17 MED ORDER — KETOROLAC TROMETHAMINE 30 MG/ML IJ SOLN
30 MG/ML | Freq: Once | INTRAMUSCULAR | Status: AC
Start: 2018-07-17 — End: 2018-07-16
  Administered 2018-07-17: 02:00:00 30 mg via INTRAVENOUS

## 2018-07-17 MED FILL — TRAMADOL HCL 50 MG PO TABS: 50 mg | ORAL | Qty: 1

## 2018-07-17 MED FILL — DIAZEPAM 5 MG PO TABS: 5 mg | ORAL | Qty: 1

## 2018-07-17 MED FILL — CEFEPIME HCL 2 G IJ SOLR: 2 g | INTRAMUSCULAR | Qty: 2

## 2018-07-17 MED FILL — ASPERCREME LIDOCAINE 4 % EX PTCH: 4 % | CUTANEOUS | Qty: 1

## 2018-07-17 MED FILL — SODIUM CHLORIDE 0.9 % IV SOLN: 0.9 % | INTRAVENOUS | Qty: 500

## 2018-07-17 MED FILL — LOVENOX 40 MG/0.4ML SC SOLN: 40 MG/0.4ML | SUBCUTANEOUS | Qty: 0.4

## 2018-07-17 MED FILL — PERCOCET 5-325 MG PO TABS: 5-325 mg | ORAL | Qty: 2

## 2018-07-17 MED FILL — HYDROMORPHONE HCL 1 MG/ML IJ SOLN: 1 mg/mL | INTRAMUSCULAR | Qty: 1

## 2018-07-17 MED FILL — METHOCARBAMOL 750 MG PO TABS: 750 mg | ORAL | Qty: 1

## 2018-07-17 MED FILL — LORAZEPAM 2 MG/ML IJ SOLN: 2 mg/mL | INTRAMUSCULAR | Qty: 1

## 2018-07-17 MED FILL — KETOROLAC TROMETHAMINE 30 MG/ML IJ SOLN: 30 mg/mL | INTRAMUSCULAR | Qty: 1

## 2018-07-17 MED FILL — VANCOMYCIN HCL 10 G IV SOLR: 10 g | INTRAVENOUS | Qty: 1250

## 2018-07-17 MED FILL — DEXMEDETOMIDINE HCL 200 MCG/2ML IV SOLN: 200 MCG/2ML | INTRAVENOUS | Qty: 4

## 2018-07-17 NOTE — Progress Notes (Signed)
Patient requesting to leave AMA. Pt educated on risk of leaving AMA. Notified residents, Dr. Meriam Sprague at bedside

## 2018-07-17 NOTE — Plan of Care (Signed)
Patient alert and oriented x4. Patient has been NPO since 0700 (aside from 0.5 cups of coffee at 1000 and sips with PO medications) due to a TEE scheduled for later in the day. Pharmacological and non-pharmacological (TV (distraction) and repositioning) interventions have been inadequate in regards to pain control throughout the morning. Patient has stated that her pain has remained consistently at a 7 - 8 (0 - 10 scale), characterizing the pain as constant/continuous and radiating from left back/hip through left leg. Patient frequently vocalizes desire to receive pain medications and keeps track of her pain medication administration schedule. Patient has an adequate urine output of >17ml/hr and successfully asks for/utilizes the bed pan for voiding. Patient is cooperative, calm, and resting. Will continue to monitor.

## 2018-07-17 NOTE — Progress Notes (Signed)
Pt in bed, crying, stating that pain is 10/10. MD paged to notify. Awaiting call back.

## 2018-07-17 NOTE — Discharge Instructions (Signed)
Follow-up with Dr. Barnet Pall as needed regarding hip pain. Call [phone] to schedule an appointment.

## 2018-07-17 NOTE — Progress Notes (Signed)
Patient VSS. Spoke with cardiology, plan for TEE tomorrow. Pt. Informed. Will continue to monitor

## 2018-07-17 NOTE — Progress Notes (Signed)
Pt in bed, c/o pain that is uncontrolled. Requesting AMA paperwork. Discussed seriousness of condition with pt, she continues to request to leave. MD called to bedside to speak with pt.

## 2018-07-17 NOTE — H&P (Signed)
Case Management Assessment           Initial Evaluation                Date / Time of Evaluation: 07/17/2018 5:01 PM                 Assessment Completed by: Wyline Beady    Patient Name: [name] [name]     Date of Birth: 07/03/1981  Diagnosis: Lumbar radiculopathy [M54.16]     Date / Time: 07/13/2018  7:50 PM    Patient Admission Status: Inpatient    If patient is discharged prior to next notation, then this note serves as note for discharge by case management.     Current PCP: [facility] Patient: No    Chart Reviewed: Yes  Patient/ Family Interviewed: Yes    Initial assessment completed at bedside with:  Patient    Hospitalization in the last 30 days: No    Emergency Contacts:  Extended Emergency Contact Information  Primary Emergency Contact: [name] [name]  Address: [place], [place] Phone: [phone]  Relation: Brother/Sister  Secondary Emergency Contact: [name] [name] Phone: [phone]  Relation: Other    Advance Directives:   Code Status: [place]: No    Financial  Payor: [employer] / Plan: [employer] [place] MEDICAID / Product Type: *No Product type* /     Pre-cert required for SNF: Yes    Pharmacy    [facility] [id number] [address] [place], [place] [phone]  [place] [address]  Phone: [phone] Fax: [phone]    [facility] [address] [place] [phone] Wanda Plump [phone]  [place]  [place] [address]  Phone: [phone] Fax: [phone]    [place], [place] [phone] Wanda Plump [phone]  [place] [address]  Phone: [phone] Fax: [phone]      Potential assistance Purchasing Medications: Potential Assistance Purchasing Medications: No  Does Patient want to participate in local  refill/ meds to beds program?: Yes    Meds To Beds General Rules:  1. Can ONLY be done Monday- Friday between 8:30am-5pm  2. Prescription(s) must be in pharmacy by 3pm to be filled same day  3.Copy of patient's insurance/ prescription drug card and patient face sheet must be sent along with the prescription(s)  4. Cost of Rx cannot be added to hospital bill. If financial assistance is needed, please contact unit case manager or Education officer, museum; Case manager or Social Worker CANNOT provide pharmacy voucher for patients co-pays  5. Patients can then pick up the prescription on their way out of the hospital at discharge, or pharmacy can deliver to the bedside if staff is available. (payment due at time of pick-up or delivery - cash, check, or card accepted)     Able to afford home medications/ co-pay costs: Yes    ADLS  Support Systems: Friends/Neighbors    PT AM-PAC:   /24  OT AM-PAC:   /24    [place]:  homeless      Plans to RETURN to current housing: No  Barriers to RETURNING to current  housing: not her home - was staying with a person she knew but not allowed to stay there anymore    [place]  Currently ACTIVE with [place]: No  Home Care Agency: Not Applicable    Durable Medical Equipment  DME Provider:   Equipment: None    Home Oxygen and Respiratory Equipment  Has HOME OXYGEN prior to admission: No  Oxygen Company: Not Applicable      Dialysis  Active with HD/PD prior to admission: No    DISCHARGE PLAN:  Disposition: Inpatient drug rehab    Transportation PLAN for discharge: Lyft     Factors facilitating achievement of predicted outcomes: Motivated    Barriers to discharge: IV Vancomycin    Additional Case Management Notes:     Admitted to ICU with lumbar radiculopathy.  Met with patient at bedside.  She shared that she has been homeless for quite some time now.  Stayed with a person she knew at address listed on face sheet.  Does not even consider him a friend.  Says she's  not allowed to stay there anymore.  She indicates that she is motivated to do IP drug rehab. She is open to recommendations on where she should go.  Referral sent to [facility] for IP treatment. Case Manager will check on other programs that accept [employer].      TEE pending d/t possible infectious endocarditis seen on TTE.  This could affect discharge planning if endocarditis is confirmed.    [name] [name] and her family were provided with choice of provider; she and her family are in agreement with the discharge plan.    Care Transition patient: No    Wyline Beady, RN  [facility]  Case Management Department  Ph: [phone]

## 2018-07-17 NOTE — Progress Notes (Signed)
Hospitalist Progress Note      PCP: [facility]    Date of Admission: 07/13/2018    Chief Complaint: Back pain    Hospital Course: Admitted with back pain, radiculopathy, epidural abscess causing pressure.  IV drug abuser.    Subjective: Complains of back pain, no chest pain, no shortness of breath, some nausea, no vomiting.      Medications:  Reviewed    Infusion Medications   ??? sodium chloride     ??? dexmedetomidine (PRECEDEX) IV infusion Stopped (07/17/18 4403)   ??? dextrose       Scheduled Medications   ??? vancomycin  1,250 mg Intravenous Q8H   ??? insulin lispro  0-6 Units Subcutaneous TID WC   ??? insulin lispro  0-3 Units Subcutaneous Nightly   ??? methocarbamol  750 mg Oral 4x Daily   ??? lidocaine  1 patch Transdermal Daily   ??? mupirocin  1 g Nasal BID   ??? cefepime  2 g Intravenous Q12H   ??? sodium chloride flush  10 mL Intravenous 2 times per day     PRN Meds: oxyCODONE-acetaminophen, diazepam, traMADol, glucose, dextrose, glucagon (rDNA), dextrose, sodium chloride flush, ondansetron, loperamide      Intake/Output Summary (Last 24 hours) at 07/17/2018 1050  Last data filed at 07/17/2018 0950  Gross per 24 hour   Intake 2420 ml   Output 3100 ml   Net -680 ml       Physical Exam Performed:    BP (!) 140/97    Pulse 55    Temp 97.9 ??F (36.6 ??C) (Oral)    Resp 16    Ht 5\' 5"  (1.651 m)    Wt 129 lb (58.5 kg)    LMP 06/12/2018 (Approximate)    SpO2 100%    BMI 21.47 kg/m??     General appearance: No apparent distress, appears stated age and cooperative.  HEENT: Pupils equal, round, and reactive to light. Conjunctivae/corneas clear.  Neck: Supple, with full range of motion. No jugular venous distention. Trachea midline.  Respiratory:  Normal respiratory effort. Clear to auscultation, bilaterally without Rales/Wheezes/Rhonchi.  Cardiovascular: Regular rate and rhythm with normal S1/S2 without murmurs, rubs or gallops.  Abdomen: Soft, non-tender, non-distended with normal bowel sounds.  Musculoskeletal: No  clubbing, cyanosis or edema bilaterally.  Full range of motion without deformity.  Skin: Skin color, texture, turgor normal.  Right thumb needle tracks with subcutaneous abscess.  Improved  Neurologic:  Neurovascularly intact without any focal sensory/motor deficits. Cranial nerves: II-XII intact, grossly non-focal.  Psychiatric: Alert and oriented, thought content appropriate, normal insight  Capillary Refill: Brisk,< 3 seconds   Peripheral Pulses: +2 palpable, equal bilaterally       Labs:   Recent Labs     07/16/18  0518 07/17/18  0544   WBC 6.4 6.2   HGB 10.7* 11.0*   HCT 32.5* 33.8*   PLT 193 231     Recent Labs     07/15/18  0519 07/16/18  0518 07/17/18  0544   NA 140 141 142   K 3.9 3.7 4.1   CL 108 105 104   CO2 21 24 27    BUN 7 8 9    CREATININE <0.5* <0.5* 0.5*   CALCIUM 8.7 8.4 8.5     Recent Labs     07/15/18  0519 07/16/18  0518 07/17/18  0544   AST 19 14* 10*   ALT 13 12 8*   BILITOT 0.3 <0.2 0.3  ALKPHOS 62 60 61     No results for input(s): INR in the last 72 hours.  No results for input(s): CKTOTAL, TROPONINI in the last 72 hours.    Urinalysis:      Lab Results   Component Value Date    NITRU Negative 07/13/2018    WBCUA 10-20 07/13/2018    BACTERIA Rare 07/13/2018    RBCUA 0-2 07/13/2018    BLOODU Negative 07/13/2018    SPECGRAV 1.010 07/13/2018    GLUCOSEU Negative 07/13/2018    GLUCOSEU Negative 02/05/2011       Radiology:  MRI PELVIS W WO CONTRAST   Final Result   1. Findings which are compatible with a myositis involving the iliac images, piriformis, and gluteus maximus muscles, presumably infectious in the setting of IV drug abuse. This abnormality extends outwards from the pelvis through the sciatic notch.   2. Effusion in the left sacroiliac joint, adjacent to muscle edema for which sterility is indeterminate. There are also small hip joint effusions.   3. Within the pelvis, the edema extends medially, and encases the left iliac vascular bundle.   4. Moderate degree of free fluid in the  pelvis.            MRI CERVICAL SPINE W WO CONTRAST   Final Result      1. No evidence for cervical spine infection   2. Postoperative changes of suboccipital craniectomy and surgical fusion extending to C2     3. C5-C6 right uncovertebral spondylosis causing moderate right foraminal stenosis         MRI thoracic spine      FINDINGS:      SPINAL CORD: Visualized spinal cord is normal signal. No pathologic intradural enhancement.      ALIGNMENT: Normal.      BONES: Vertebral body heights and bone marrow signal are normal.      PARASPINAL TISSUES: Posterior paraspinal muscles are normal signal.      DISC LEVELS:      Thoracic intervertebral discs are normal signal and morphology. No compressive abnormality or spinal stenosis. Right T10 foraminal perineural cyst is present measuring 2 cm projecting from the level of the lateral margin of thecal sac through the neural    foramen into the perivertebral space.      Other: Trace bilateral pleural effusions are present.      IMPRESSION:      1. No evidence for infection involving the thoracic spine   2. Large T10 right foraminal perineural cyst projecting into the perivertebral space   3. Trace pleural effusions         MRI THORACIC SPINE W WO CONTRAST   Final Result      1. No evidence for cervical spine infection   2. Postoperative changes of suboccipital craniectomy and surgical fusion extending to C2     3. C5-C6 right uncovertebral spondylosis causing moderate right foraminal stenosis         MRI thoracic spine      FINDINGS:      SPINAL CORD: Visualized spinal cord is normal signal. No pathologic intradural enhancement.      ALIGNMENT: Normal.      BONES: Vertebral body heights and bone marrow signal are normal.      PARASPINAL TISSUES: Posterior paraspinal muscles are normal signal.      DISC LEVELS:      Thoracic intervertebral discs are normal signal and morphology. No compressive abnormality or spinal stenosis. Right T10 foraminal  perineural cyst is present  measuring 2 cm projecting from the level of the lateral margin of thecal sac through the neural    foramen into the perivertebral space.      Other: Trace bilateral pleural effusions are present.      IMPRESSION:      1. No evidence for infection involving the thoracic spine   2. Large T10 right foraminal perineural cyst projecting into the perivertebral space   3. Trace pleural effusions         MRI LUMBAR SPINE WO CONTRAST   Final Result   Addendum 1 of 1   ** ADDENDUM #1 **   Addendum:      There is some edema between the lower left psoas muscle and iliacus muscle    incompletely evaluated on the lumbar spine pulse sequences. Infection is a    possibility and could be better evaluated with dedicated pelvis MRI    with/without contrast.      Final              Assessment/Plan:    Active Hospital Problems    Diagnosis   ??? Acute midline low back pain without sciatica [M54.5]   ??? IVDU (intravenous drug user) [F19.90]   ??? Lumbar radiculopathy [M54.16]   ??? Polysubstance abuse (HCC) [F19.10]     PLAN    Cord compression   Acute, seems to be causing lumbar radiculopathy and pain  Secondary to epidural abscess  Neurosurgery consult requested.    Epidural abscess:  Most likely caused by septic emboli as a consequence of IV drug abuse  Neurosurgery consult requested  Started on IV antibiotic  Infectious disease consult also requested.  Work-up in progress to evaluate cardiac valves for endocarditis as a focus for septic emboli    IV drug abuse  Intravenous fentanyl and heroin are the drugs of choice  No signs of withdrawal at this time  Will not give intravenous narcotics  Start COWS protocol    Hepatitis C:  No transaminase elevation  Patient will require outpatient follow-up.  ??  Discussed with patient    DVT Prophylaxis: Lovenox  Diet: DIET GENERAL;  Code Status: Full Code    PT/OT Eval Status: Not needed for now.  Consider later, once antibiotic treatment is planned    Dispo -inpatient, unclear length of stay.    Jeralyn Ruths, MD

## 2018-07-17 NOTE — Progress Notes (Signed)
Pt given one time dose of Ketorolac and Ativan. Pt states pain better controlled at this time. Rating pain 7/10.
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
## 2018-07-17 NOTE — Plan of Care (Signed)
Care plan reviewed    Problem: Pain:  Goal: Pain level will decrease  Description  Pain level will decrease  Outcome: Ongoing  Goal: Control of acute pain  Description  Control of acute pain  Outcome: Ongoing     Problem: Falls - Risk of:  Goal: Will remain free from falls  Description  Will remain free from falls  Outcome: Ongoing     Problem: INFECTION  Goal: Absence of infection signs and symptoms  Outcome: Ongoing     Problem: Risk for Impaired Skin Integrity  Goal: Tissue integrity - skin and mucous membranes  Description  Structural intactness and normal physiological function of skin and  mucous membranes.  Outcome: Ongoing

## 2018-07-17 NOTE — Progress Notes (Signed)
Pt able to rest some overnight, no longer restless after Ketorolac and Ativan. States pain has improved, now rating pain 7-8/10. Precedex gtt titrated as tolerated.

## 2018-07-17 NOTE — Progress Notes (Signed)
ID Follow-up NOTE    CC:   Pelvic myositis  Antibiotics: Vancomycin, Cefepime    Admit Date: 07/13/2018  Hospital Day: 5    Subjective:     Patient c/o LBP / pelvic pain, severe over weekend.  Denies withdrawal sx    Objective:     Patient Vitals for the past 8 hrs:   BP Temp Temp src Pulse Resp SpO2   07/17/18 1300 (!) 160/102 -- -- 58 14 99 %   07/17/18 1200 (!) 145/94 98.1 ??F (36.7 ??C) Oral 52 13 100 %   07/17/18 1000 (!) 140/97 -- -- -- -- 100 %   07/17/18 0900 (!) 134/92 -- -- 55 -- 98 %   07/17/18 0800 (!) 143/91 97.9 ??F (36.6 ??C) Oral 58 16 99 %   07/17/18 0755 -- -- -- -- -- 100 %     I/O last 3 completed shifts:  In: 673 [I.V.:673]  Out: 3850 [Urine:3850]  No intake/output data recorded.    EXAM:  GENERAL: No apparent distress.    HEENT: Membranes moist, no oral lesion  NECK:  Supple  LUNGS: Clear b/l, no rales, no dullness  CARDIAC: RRR, no murmur appreciated  ABD:  + BS, soft / NT  EXT:  No rash, no edema, no lesions  NEURO: No focal neurologic findings  PSYCH: Orientation, sensorium, mood normal  LINES:  Peripheral iv       Data Review:  Lab Results   Component Value Date    WBC 6.2 07/17/2018    HGB 11.0 (L) 07/17/2018    HCT 33.8 (L) 07/17/2018    MCV 84.6 07/17/2018    PLT 231 07/17/2018     Lab Results   Component Value Date    CREATININE 0.5 (L) 07/17/2018    BUN 9 07/17/2018    NA 142 07/17/2018    K 4.1 07/17/2018    CL 104 07/17/2018    CO2 27 07/17/2018       Hepatic Function Panel:   Lab Results   Component Value Date    ALKPHOS 61 07/17/2018    ALT 8 07/17/2018    AST 10 07/17/2018    PROT 6.6 07/17/2018    PROT 7.1 01/17/2010    BILITOT 0.3 07/17/2018    LABALBU 2.8 07/17/2018       MICRO:  9/12 BC x 2 no growth to date    IMAGING:  9/14 Pelvic MRI:  1. Findings which are compatible with a myositis involving the iliac images, piriformis, and gluteus maximus muscles, presumably infectious in the setting of IV drug abuse. This abnormality extends outwards from the pelvis through the sciatic  notch.   2. Effusion in the left sacroiliac joint, adjacent to muscle edema for which sterility is indeterminate. There are also small hip joint effusions.   3. Within the pelvis, the edema extends medially, and encases the left iliac vascular bundle.   4. Moderate degree of free fluid in the pelvis.     9/12 Lumbar MRI:  1. No evidence for infection involving lumbar spine   2. L4-L5 mild bilateral foraminal stenosis related to small foraminal disc herniations. No nerve root compression.       Scheduled Meds:  ??? enoxaparin  40 mg Subcutaneous Daily   ??? vancomycin  1,250 mg Intravenous Q8H   ??? insulin lispro  0-6 Units Subcutaneous TID WC   ??? insulin lispro  0-3 Units Subcutaneous Nightly   ??? methocarbamol  750 mg Oral 4x  Daily   ??? lidocaine  1 patch Transdermal Daily   ??? mupirocin  1 g Nasal BID   ??? cefepime  2 g Intravenous Q12H   ??? sodium chloride flush  10 mL Intravenous 2 times per day       Continuous Infusions:  ??? sodium chloride     ??? dextrose         PRN Meds:  HYDROmorphone, oxyCODONE-acetaminophen, diazepam, traMADol, glucose, dextrose, glucagon (rDNA), dextrose, sodium chloride flush, ondansetron, loperamide      Assessment:     37 yo woman with substance abuse d/o, psychiatric d/o, chronic pain (neck, hx mult surg)  ??  Pt with active IV heroin use.  Presents with 4-5 day hx worsening LBP, assoc LE weakness, 1 episode of urinary incontinence  Pain worse on L and with L leg movement.    No trauma.  Reports fever / chills (no documented fever).    ??  Seen at [facility] ED - afeb, normal WBC.  BC sent  Transferred to [facility] for Neurosurg eval.    MRI L4/5 bilateral foraminal stenosis, 'no evidence of infection'  MRI T (T10 cyst) / C spine with no evidence of infection  ??  CRP 127.7, CRP 75  HIV NR  ??  Pt c/o aches, L side pain;  subjective fevers  L paraspinal pain with palpation  ??  IMP/  IVDU  LBP - no spinal infection on imaging  Elevated ESR / CRP  Thoracic / T0 R foraminal perineural cyst - benign    Pelvic  myositis seen on lumbar MRI - at inferior aspect of imaging, ST signal, unable to further characterize    R arm infection, related to injection site    Plan:     Empiric vancomycin / cefepime  Check BC result - no growth to date  Will review MRI with radiologist  ??  Withdrawal / pain management per ICU team, Hospitalist  ??  Discussed with pt, RN  Ander Slade, MD
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
## 2018-07-18 ENCOUNTER — Ambulatory Visit: Payer: PRIVATE HEALTH INSURANCE | Primary: Family Medicine

## 2018-07-18 LAB — CULTURE, BLOOD 2: Culture, Blood 2: NO GROWTH

## 2018-07-18 LAB — SEDIMENTATION RATE: Sed Rate, Automated: 71 mm/h — ABNORMAL HIGH (ref 0–20)

## 2018-07-18 LAB — CBC WITH AUTO DIFFERENTIAL
Basophils %: 1.2 %
Basophils Absolute: 0.1 10*3/uL (ref 0.0–0.2)
Eosinophils %: 1.2 %
Eosinophils Absolute: 0.1 10*3/uL (ref 0.0–0.6)
Hematocrit: 34.9 % — ABNORMAL LOW (ref 36.0–48.0)
Hemoglobin: 11.8 g/dL — ABNORMAL LOW (ref 12.0–16.0)
Lymphocytes %: 29.1 %
Lymphocytes Absolute: 2.2 10*3/uL (ref 1.0–5.1)
MCH: 28.1 pg (ref 26.0–34.0)
MCHC: 33.8 g/dL (ref 31.0–36.0)
MCV: 83 fL (ref 80.0–100.0)
MPV: 7.8 fL (ref 5.0–10.5)
Monocytes %: 7.1 %
Monocytes Absolute: 0.5 10*3/uL (ref 0.0–1.3)
Neutrophils %: 61.4 %
Neutrophils Absolute: 4.7 10*3/uL (ref 1.7–7.7)
Platelets: 300 10*3/uL (ref 135–450)
RBC: 4.21 M/uL (ref 4.00–5.20)
RDW: 14.6 % (ref 12.4–15.4)
WBC: 7.6 10*3/uL (ref 4.0–11.0)

## 2018-07-18 LAB — COMPREHENSIVE METABOLIC PANEL W/ REFLEX TO MG FOR LOW K
ALT: 10 U/L (ref 10–40)
AST: 12 U/L — ABNORMAL LOW (ref 15–37)
Albumin/Globulin Ratio: 0.9 — ABNORMAL LOW (ref 1.1–2.2)
Albumin: 3.4 g/dL (ref 3.4–5.0)
Alkaline Phosphatase: 67 U/L (ref 40–129)
Anion Gap: 12 (ref 3–16)
BUN: 10 mg/dL (ref 7–20)
CO2: 26 mmol/L (ref 21–32)
Calcium: 9.1 mg/dL (ref 8.3–10.6)
Chloride: 101 mmol/L (ref 99–110)
Creatinine: 0.5 mg/dL — ABNORMAL LOW (ref 0.6–1.1)
GFR African American: >60 (ref >60)
GFR Non-African American: >60 (ref >60)
Globulin: 3.8 g/dL
Glucose: 148 mg/dL — ABNORMAL HIGH (ref 70–99)
Potassium reflex Magnesium: 3.8 mmol/L (ref 3.5–5.1)
Sodium: 139 mmol/L (ref 136–145)
Total Bilirubin: 0.3 mg/dL (ref 0.0–1.0)
Total Protein: 7.2 g/dL (ref 6.4–8.2)

## 2018-07-18 LAB — C-REACTIVE PROTEIN: CRP: 29.1 mg/L — ABNORMAL HIGH (ref 0.0–5.1)

## 2018-07-18 LAB — POCT GLUCOSE: POC Glucose: 100 mg/dl — ABNORMAL HIGH (ref 70–99)

## 2018-07-18 LAB — HCG, SERUM, QUALITATIVE: Preg, Serum: NEGATIVE

## 2018-07-18 LAB — VANCOMYCIN LEVEL, TROUGH: Vancomycin Tr: 17.4 ug/mL (ref 10.0–20.0)

## 2018-07-18 LAB — ECHOCARDIOGRAM TRANSESOPHAGEAL: Left Ventricular Ejection Fraction: 58

## 2018-07-18 LAB — CULTURE BLOOD #1: Blood Culture, Routine: NO GROWTH

## 2018-07-18 MED ORDER — MEPERIDINE HCL 25 MG/ML IJ SOLN
25 MG/ML | INTRAMUSCULAR | Status: DC | PRN
Start: 2018-07-18 — End: 2018-07-18

## 2018-07-18 MED ORDER — OXYCODONE HCL 5 MG PO TABS
5 MG | ORAL | Status: DC | PRN
Start: 2018-07-18 — End: 2018-07-18

## 2018-07-18 MED ORDER — LORAZEPAM 2 MG/ML IJ SOLN
2 MG/ML | Freq: Once | INTRAMUSCULAR | Status: AC
Start: 2018-07-18 — End: 2018-07-18
  Administered 2018-07-18: 04:00:00 1 mg via INTRAVENOUS

## 2018-07-18 MED ORDER — LORAZEPAM 2 MG/ML IJ SOLN
2 MG/ML | Freq: Once | INTRAMUSCULAR | Status: AC
Start: 2018-07-18 — End: 2018-07-17
  Administered 2018-07-18: 01:00:00 1 mg via INTRAVENOUS

## 2018-07-18 MED ORDER — KETOROLAC TROMETHAMINE 30 MG/ML IJ SOLN
30 MG/ML | Freq: Once | INTRAMUSCULAR | Status: AC
Start: 2018-07-18 — End: 2018-07-17
  Administered 2018-07-18: 01:00:00 30 mg via INTRAVENOUS

## 2018-07-18 MED ORDER — LABETALOL HCL 20 MG/4ML IV SOSY
20 MG/4ML | INTRAVENOUS | Status: DC | PRN
Start: 2018-07-18 — End: 2018-07-18

## 2018-07-18 MED ORDER — LIDOCAINE HCL 2 % IJ SOLN
2 % | INTRAMUSCULAR | Status: DC | PRN
Start: 2018-07-18 — End: 2018-07-18
  Administered 2018-07-18: 13:00:00 100 via INTRAVENOUS

## 2018-07-18 MED ORDER — SODIUM CHLORIDE 0.9 % IV SOLN
0.9 % | INTRAVENOUS | Status: DC | PRN
Start: 2018-07-18 — End: 2018-07-18
  Administered 2018-07-18: 13:00:00 via INTRAVENOUS

## 2018-07-18 MED ORDER — DIPHENHYDRAMINE HCL 50 MG/ML IJ SOLN
50 MG/ML | Freq: Once | INTRAMUSCULAR | Status: DC | PRN
Start: 2018-07-18 — End: 2018-07-18

## 2018-07-18 MED ORDER — PROPOFOL 200 MG/20ML IV EMUL
200 MG/20ML | INTRAVENOUS | Status: DC | PRN
Start: 2018-07-18 — End: 2018-07-18
  Administered 2018-07-18 (×3): 50 via INTRAVENOUS
  Administered 2018-07-18: 13:00:00 100 via INTRAVENOUS

## 2018-07-18 MED ORDER — HYDROMORPHONE HCL 1 MG/ML IJ SOLN
1 MG/ML | INTRAMUSCULAR | Status: DC | PRN
Start: 2018-07-18 — End: 2018-07-18

## 2018-07-18 MED ORDER — LINEZOLID 600 MG PO TABS
600 MG | ORAL_TABLET | Freq: Two times a day (BID) | ORAL | 0 refills | Status: AC
Start: 2018-07-18 — End: 2018-08-15

## 2018-07-18 MED ORDER — MORPHINE SULFATE 4 MG/ML IJ SOLN
4 MG/ML | INTRAMUSCULAR | Status: DC | PRN
Start: 2018-07-18 — End: 2018-07-18

## 2018-07-18 MED ORDER — DIPHENHYDRAMINE HCL 50 MG/ML IJ SOLN
50 MG/ML | Freq: Once | INTRAMUSCULAR | Status: AC
Start: 2018-07-18 — End: 2018-07-18
  Administered 2018-07-18: 04:00:00 12.5 mg via INTRAVENOUS

## 2018-07-18 MED ORDER — METOCLOPRAMIDE HCL 5 MG/ML IJ SOLN
5 MG/ML | Freq: Once | INTRAMUSCULAR | Status: DC | PRN
Start: 2018-07-18 — End: 2018-07-18

## 2018-07-18 MED ORDER — KETOROLAC TROMETHAMINE 30 MG/ML IJ SOLN
30 MG/ML | Freq: Once | INTRAMUSCULAR | Status: AC
Start: 2018-07-18 — End: 2018-07-18
  Administered 2018-07-18: 05:00:00 15 mg via INTRAVENOUS

## 2018-07-18 MED ORDER — PROMETHAZINE HCL 25 MG/ML IJ SOLN
25 MG/ML | Freq: Once | INTRAMUSCULAR | Status: DC | PRN
Start: 2018-07-18 — End: 2018-07-18

## 2018-07-18 MED ORDER — HYDRALAZINE HCL 20 MG/ML IJ SOLN
20 MG/ML | INTRAMUSCULAR | Status: DC | PRN
Start: 2018-07-18 — End: 2018-07-18

## 2018-07-18 MED FILL — ASPERCREME LIDOCAINE 4 % EX PTCH: 4 % | CUTANEOUS | Qty: 1

## 2018-07-18 MED FILL — CEFEPIME HCL 2 G IJ SOLR: 2 g | INTRAMUSCULAR | Qty: 2

## 2018-07-18 MED FILL — VANCOMYCIN HCL 1 G IV SOLR: 1 g | INTRAVENOUS | Qty: 1250

## 2018-07-18 MED FILL — VANCOMYCIN HCL 10 G IV SOLR: 10 g | INTRAVENOUS | Qty: 1250

## 2018-07-18 MED FILL — TRAMADOL HCL 50 MG PO TABS: 50 mg | ORAL | Qty: 1

## 2018-07-18 MED FILL — KETOROLAC TROMETHAMINE 30 MG/ML IJ SOLN: 30 mg/mL | INTRAMUSCULAR | Qty: 1

## 2018-07-18 MED FILL — HYDROCODONE-ACETAMINOPHEN 5-325 MG PO TABS: 5-325 mg | ORAL | Qty: 2

## 2018-07-18 MED FILL — LOVENOX 40 MG/0.4ML SC SOLN: 40 MG/0.4ML | SUBCUTANEOUS | Qty: 0.4

## 2018-07-18 MED FILL — DIPHENHYDRAMINE HCL 50 MG/ML IJ SOLN: 50 mg/mL | INTRAMUSCULAR | Qty: 1

## 2018-07-18 MED FILL — LORAZEPAM 2 MG/ML IJ SOLN: 2 mg/mL | INTRAMUSCULAR | Qty: 1

## 2018-07-18 MED FILL — HYDROMORPHONE HCL 1 MG/ML IJ SOLN: 1 mg/mL | INTRAMUSCULAR | Qty: 1

## 2018-07-18 MED FILL — DIAZEPAM 5 MG PO TABS: 5 mg | ORAL | Qty: 1

## 2018-07-18 MED FILL — METHOCARBAMOL 750 MG PO TABS: 750 mg | ORAL | Qty: 1

## 2018-07-18 NOTE — Care Coordination-Inpatient (Signed)
Case Management Notes:     Met with patient in room and attempted to convince her to not leave against medical advice.  I asked her where she was going, because yesterday she told me she had nowhere to go.  Said she was going "back to where she was before", which was basically the streets.  She would not wait to leave for me to hear back from [facility].      Wyline Beady, RN, BSN  [facility]  Case Management Department  Ph: [phone]

## 2018-07-18 NOTE — Discharge Summary (Signed)
Hospital Medicine Discharge Summary      Patient ID: [name] [name]      Patient's PCP: Jena Gauss [facility]    Admit Date: 07/13/2018     Discharge Date: 07/18/2018  (left AMA)    Admitting Physician: Veda Canning, MD    Discharge Physician: Lorenso Quarry, MD     Discharge Diagnoses:     Active Hospital Problems    Diagnosis Date Noted   ??? Pyomyositis [M60.009] 07/18/2018   ??? Acute midline low back pain without sciatica [M54.5] 07/14/2018   ??? IVDU (intravenous drug user) [F19.90] 07/14/2018   ??? Lumbar radiculopathy [M54.16] 07/13/2018   ??? Polysubstance abuse ([place]) [F19.10] 08/02/2017         The patient was seen and examined on day of discharge and this discharge summary is in conjunction with any daily progress note from day of discharge.    Hospital Course:     Patient is a 37 y/o female with medical history of IVDU, polysubstance abuse, who presented with lower back pain  And leg weakness. She was seen by infectious disease and neurosurgery. Patient had TEE. Endocarditis was ruled out. Spinal infection was ruled out. Patient found to have myositis involving iliac, pirifomis, gluteus maximus muscles. Patient was on IV antibiotics. She left AMA. Scripts for linezolid were provided.     Consults:     PHARMACY TO DOSE VANCOMYCIN  IP CONSULT TO SOCIAL WORK  IP CONSULT TO NEUROSURGERY  IP CONSULT TO CRITICAL CARE  IP CONSULT TO INFECTIOUS DISEASES  IP CONSULT TO ORTHOPEDIC SURGERY  IP CONSULT TO INTERVENTIONAL RADIOLOGY  IP CONSULT TO CARDIOLOGY      Disposition: left AMA    Discharged Condition: Stable    Code Status: Prior    Activity: activity as tolerated    Diet: regular diet    Follow Up: Primary Care Physician in one week    Exam:     General appearance: No apparent distress, appears stated age and cooperative.  Lungs: Clear to ascultation, bilaterally without Rales/Wheezes/Rhonchi with good respiratory effort.  Heart: Regular rate and rhythm with Normal S1/S2 without  murmurs, rubs or  gallops, point of maximum impulse non-displaced  Abdomen: Soft, non-tender or non-distended without rigidity or guarding and positive bowel sounds all four quadrants.  Extremities: No clubbing, cyanosis, or edema bilaterally.  Skin: Skin color, texture, turgor normal.    Neurologic: Alert and oriented X 3,   grossly non-focal.  Mental status: Alert, oriented, thought content appropriate      Labs: For convenience and continuity at follow-up the following most recent labs are provided:    CBC:   Lab Results   Component Value Date    WBC 7.6 07/18/2018    HGB 11.8 07/18/2018    HCT 34.9 07/18/2018    PLT 300 07/18/2018       RENAL:   Lab Results   Component Value Date    NA 139 07/18/2018    K 3.8 07/18/2018    CL 101 07/18/2018    CO2 26 07/18/2018    BUN 10 07/18/2018    CREATININE 0.5 07/18/2018           Discharge Medications:   Discharge Medication List as of 07/18/2018 11:01 AM           Details   linezolid (ZYVOX) 600 MG tablet Take 1 tablet by mouth 2 times daily for 28 days, Disp-56 tablet, R-0Print  Time Spent on discharge is less than 30 minutes in the examination, evaluation, counseling and review of medications and discharge plan.      Signed:  Lorenso Quarry, MD   07/25/2018      Thank you [place] [facility] for the opportunity to be involved in this patient's care. If you have any questions or concerns please feel free to contact me at [phone].

## 2018-07-18 NOTE — Progress Notes (Signed)
Pt. Returned from Cath lab requesting to leave AMA. She took her EKG leads off, got dressed. RN attempted to reason with patient and explain to her the risk of leaving AMA. Dr. Raiford Simmonds notified and came to bedside. Pt. Was still adamant about leaving AMA. She signed form to be medically released against medical advice. IV removed. Prescription given to patient, she was asked to wait for follow up information. As I was printing her follow up information off for her she took off. RN went down stairs to give her the information.

## 2018-07-18 NOTE — Anesthesia Pre-Procedure Evaluation (Signed)
Department of Anesthesiology  Preprocedure Note       Name:  [name] [name]   Age:  37 y.o.  DOB:  [birth year]                                          MRN:  [id number]         Date:  07/18/2018      Surgeon: * Surgery not found *    Procedure: CATH LAB WITH ANESTHESIA    Medications prior to admission:   Prior to Admission medications    Not on File       Current medications:    Current Facility-Administered Medications   Medication Dose Route Frequency Provider Last Rate Last Dose   ??? HYDROmorphone (DILAUDID) injection 0.25 mg  0.25 mg Intravenous Q5 Min PRN Windy Kalata, MD       ??? HYDROmorphone (DILAUDID) injection 0.5 mg  0.5 mg Intravenous Q5 Min PRN Windy Kalata, MD       ??? morphine injection 1 mg  1 mg Intravenous Q5 Min PRN Windy Kalata, MD       ??? HYDROmorphone (DILAUDID) injection 0.5 mg  0.5 mg Intravenous Q5 Min PRN Windy Kalata, MD       ??? oxyCODONE (ROXICODONE) immediate release tablet 5 mg  5 mg Oral PRN Windy Kalata, MD        Or   ??? oxyCODONE (ROXICODONE) immediate release tablet 10 mg  10 mg Oral PRN Windy Kalata, MD       ??? diphenhydrAMINE (BENADRYL) injection 12.5 mg  12.5 mg Intravenous Once PRN Windy Kalata, MD       ??? metoclopramide (REGLAN) injection 10 mg  10 mg Intravenous Once PRN Windy Kalata, MD       ??? promethazine (PHENERGAN) injection 6.25 mg  6.25 mg Intravenous Once PRN Windy Kalata, MD       ??? labetalol (NORMODYNE;TRANDATE) injection syringe 5 mg  5 mg Intravenous Q10 Min PRN Windy Kalata, MD       ??? hydrALAZINE (APRESOLINE) injection 5 mg  5 mg Intravenous Q10 Min PRN Windy Kalata, MD       ??? meperidine (DEMEROL) injection 12.5 mg  12.5 mg Intravenous Q5 Min PRN Windy Kalata, MD       ??? enoxaparin (LOVENOX) injection 40 mg  40 mg Subcutaneous Daily Geanie Berlin, MD   40 mg at 07/17/18 1321   ??? HYDROmorphone (DILAUDID) injection 1 mg  1 mg Intravenous Q4H PRN Geanie Berlin, MD   1 mg at 07/18/18 0505   ??? HYDROcodone-acetaminophen (NORCO)  5-325 MG per tablet 2 tablet  2 tablet Oral U9W PRN Mauricia Area, MD   2 tablet at 07/18/18 0300   ??? vancomycin (VANCOCIN) 1,250 mg in dextrose 5 % 250 mL IVPB  1,250 mg Intravenous Q8H Geanie Berlin, MD   Stopped at 07/18/18 [phone]   ??? insulin lispro (HUMALOG) injection pen 0-6 Units  0-6 Units Subcutaneous TID WC Geanie Berlin, MD   Stopped at 07/16/18 0931   ??? insulin lispro (HUMALOG) injection pen 0-3 Units  0-3 Units Subcutaneous Nightly Geanie Berlin, MD   1 Units at 07/15/18 2122   ??? methocarbamol (ROBAXIN) tablet 750 mg  750 mg Oral 4x Daily Geanie Berlin, MD   750 mg at 07/17/18 2003   ???  diazepam (VALIUM) tablet 5 mg  5 mg Oral Q6H PRN Geanie Berlin, MD   5 mg at 07/18/18 0602   ??? traMADol (ULTRAM) tablet 50 mg  50 mg Oral Q6H PRN Geanie Berlin, MD   50 mg at 07/18/18 0602   ??? lidocaine 4 % external patch 1 patch  1 patch Transdermal Daily Geanie Berlin, MD   1 patch at 07/17/18 0914   ??? glucose (GLUTOSE) 40 % oral gel 15 g  15 g Oral PRN Geanie Berlin, MD       ??? dextrose 50 % IV solution  12.5 g Intravenous PRN Geanie Berlin, MD       ??? glucagon (rDNA) injection 1 mg  1 mg Intramuscular PRN Geanie Berlin, MD       ??? dextrose 5 % solution  100 mL/hr Intravenous PRN Geanie Berlin, MD       ??? mupirocin (BACTROBAN) 2 % ointment 1 g  1 g Nasal BID Geanie Berlin, MD   1 g at 07/17/18 2003   ??? cefepime (MAXIPIME) 2 g IVPB minibag  2 g Intravenous Q12H Geanie Berlin, MD   Stopped at 07/17/18 2101   ??? sodium chloride flush 0.9 % injection 10 mL  10 mL Intravenous 2 times per day Geanie Berlin, MD   10 mL at 07/17/18 0806   ??? sodium chloride flush 0.9 % injection 10 mL  10 mL Intravenous PRN Geanie Berlin, MD       ??? ondansetron [facility]) injection 4 mg  4 mg Intravenous Q6H PRN Geanie Berlin, MD       ??? loperamide (IMODIUM) capsule 2 mg  2 mg Oral 4x Daily PRN Geanie Berlin, MD           Allergies:    Allergies   Allergen Reactions   ??? Latex Itching, Rash and Other (See Comments)      Burning to the touch    ??? Latex Rash       Problem List:    Patient Active Problem List   Diagnosis Code   ??? SIRS (systemic inflammatory response syndrome) (HCC) R65.10   ??? Hypotension I95.9   ??? Polysubstance (excluding opioids) dependence (HCC) F19.20   ??? Sepsis (HCC) A41.9   ??? Hyperchloremic metabolic acidosis N56.2   ??? Elevated LFTs R94.5   ??? Polysubstance abuse ([place]) F19.10   ??? Abdominal pain R10.9   ??? Lumbar radiculopathy M54.16   ??? Acute midline low back pain without sciatica M54.5   ??? IVDU (intravenous drug user) F19.90   ??? 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 Anxiety    ??? 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 Bipolar affective disorder ([place])    ??? Cervical fusion syndrome    ??? Chronic pain    ??? Depression    ??? Gestational diabetes mellitus    ??? Hepatitis A 07/14/2018   ??? Hepatitis C 08/02/2017   ??? 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   ??? History of heroin abuse    ??? HPV in female    ??? Miscarriage    ??? 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 Neck pain    ??? OCD (obsessive compulsive disorder)    ??? Paranoia ([employer])    ??? Tobacco abuse        Past Surgical History:  Procedure Laterality Date   ??? CERVICAL SPINE SURGERY      C1-C2 fusion 2003   ??? CESAREAN SECTION     ??? ECTOPIC PREGNANCY SURGERY         Social History:    Social History     Tobacco Use   ??? Smoking status: Current Every Day Smoker     Packs/day: 0.50     Years: 11.00     Pack years: 5.50     Types: Cigarettes   ??? Smokeless tobacco: Never Used   Substance Use Topics   ??? Alcohol use: Yes     Comment: every 2 months                                Ready to quit: Not Answered  Counseling given: Not Answered      Vital Signs (Current):   Vitals:    07/18/18 0200 07/18/18 0300 07/18/18 0400 07/18/18 0732   BP: 126/73 132/81 (!) 144/70 127/79   Pulse: 52 53 64 58   Resp: 16 16 13 15    Temp:   97.6 ??F (36.4 ??C)    TempSrc:   Oral Oral   SpO2:   100% 99%   Weight:       Height:                                                   BP Readings from Last 3 Encounters:   07/18/18 127/79   07/13/18 123/83   08/04/17 (!) 162/87       NPO Status:                                                                                 BMI:   Wt Readings from Last 3 Encounters:   07/14/18 129 lb (58.5 kg)   07/13/18 128 lb 12 oz (58.4 kg)   08/01/17 131 lb 3.2 oz (59.5 kg)     Body mass index is 21.47 kg/m??.    CBC:   Lab Results   Component Value Date    WBC 7.6 07/18/2018    RBC 4.21 07/18/2018    HGB 11.8 07/18/2018    HCT 34.9 07/18/2018    MCV 83.0 07/18/2018    RDW 14.6 07/18/2018    PLT 300 07/18/2018       CMP:   Lab Results   Component Value Date    NA 139 07/18/2018    K 3.8 07/18/2018    CL 101 07/18/2018    CO2 26 07/18/2018    BUN 10 07/18/2018    CREATININE 0.5 07/18/2018    GFRAA >60 07/18/2018    GFRAA >60 01/17/2010    AGRATIO 0.9 07/18/2018    LABGLOM >60 07/18/2018    GLUCOSE 148 07/18/2018    PROT 7.2 07/18/2018    PROT 7.1 01/17/2010    CALCIUM 9.1 07/18/2018    BILITOT 0.3 07/18/2018    ALKPHOS 67 07/18/2018  AST 12 07/18/2018    ALT 10 07/18/2018       POC Tests:   Recent Labs     07/17/18  2105   POCGLU 100*       Coags:   Lab Results   Component Value Date    PROTIME 14.1 07/14/2018    INR 1.24 07/14/2018    APTT 29.9 07/14/2018       HCG (If Applicable):   Lab Results   Component Value Date    PREGTESTUR Negative 07/14/2018        ABGs: No results found for: PHART, PO2ART, PCO2ART, HCO3ART, BEART, O2SATART     Type & Screen (If Applicable):  No results found for: LABABO, [facility]    Anesthesia Evaluation  Patient summary reviewed and Nursing notes reviewed no history of anesthetic complications:   Airway: Mallampati: II  TM distance: >3 FB   Neck ROM: full  Mouth opening: > = 3 FB Dental:          Pulmonary:Negative Pulmonary ROS                              Cardiovascular:                      Neuro/Psych:   (+) psychiatric history:            GI/Hepatic/Renal:   (+) hepatitis: A and C, liver disease:,            Endo/Other:    (+) DiabetesType II DM, , .                  ROS comment: Polysubstance abuse (cocaine, fentanyl, marijuana) Abdominal:           Vascular:                                        Anesthesia Plan      general     ASA 69     (37 year old female presents for TEE.  Plan general anesthesia with ASA standard monitors.  Questions answered.  Patient agreeable with anesthetic plan.  )  Induction: intravenous.      Anesthetic plan and risks discussed with patient.      Plan discussed with CRNA.    Attending anesthesiologist reviewed and agrees with Pre Eval content        Windy Kalata, MD   07/18/2018

## 2018-07-18 NOTE — Progress Notes (Signed)
ID Follow-up NOTE    CC:   Pelvic myositis  Antibiotics: Vancomycin, Cefepime    Admit Date: 07/13/2018  Hospital Day: 6    Subjective:     Patient c/o LBP / pelvic pain, severe over weekend.  Denies withdrawal sx    Objective:     Patient Vitals for the past 8 hrs:   BP Temp Temp src Pulse Resp SpO2   07/18/18 0732 127/79 -- Oral 58 15 99 %   07/18/18 0400 (!) 144/70 97.6 ??F (36.4 ??C) Oral 64 13 100 %   07/18/18 0300 132/81 -- -- 53 16 --     I/O last 3 completed shifts:  In: 3154 [P.O.:480; I.V.:878]  Out: 2200 [Urine:2200]  No intake/output data recorded.    EXAM:  GENERAL: No apparent distress.    HEENT: Membranes moist, no oral lesion  NECK:  Supple  LUNGS: Clear b/l, no rales, no dullness  CARDIAC: RRR, no murmur appreciated  ABD:  + BS, soft / NT  EXT:  No rash, no edema, no lesions  NEURO: No focal neurologic findings  PSYCH: Orientation, sensorium, mood normal  LINES:  Peripheral iv       Data Review:  Lab Results   Component Value Date    WBC 7.6 07/18/2018    HGB 11.8 (L) 07/18/2018    HCT 34.9 (L) 07/18/2018    MCV 83.0 07/18/2018    PLT 300 07/18/2018     Lab Results   Component Value Date    CREATININE 0.5 (L) 07/18/2018    BUN 10 07/18/2018    NA 139 07/18/2018    K 3.8 07/18/2018    CL 101 07/18/2018    CO2 26 07/18/2018       Hepatic Function Panel:   Lab Results   Component Value Date    ALKPHOS 67 07/18/2018    ALT 10 07/18/2018    AST 12 07/18/2018    PROT 7.2 07/18/2018    PROT 7.1 01/17/2010    BILITOT 0.3 07/18/2018    LABALBU 3.4 07/18/2018       MICRO:  9/12 BC x 2 no growth to date    IMAGING:  9/14 Pelvic MRI:  1. Findings which are compatible with a myositis involving the iliac images, piriformis, and gluteus maximus muscles, presumably infectious in the setting of IV drug abuse. This abnormality extends outwards from the pelvis through the sciatic notch.   2. Effusion in the left sacroiliac joint, adjacent to muscle edema for which sterility is indeterminate. There are also small hip  joint effusions.   3. Within the pelvis, the edema extends medially, and encases the left iliac vascular bundle.   4. Moderate degree of free fluid in the pelvis.     9/12 Lumbar MRI:  1. No evidence for infection involving lumbar spine   2. L4-L5 mild bilateral foraminal stenosis related to small foraminal disc herniations. No nerve root compression.       Scheduled Meds:  ??? enoxaparin  40 mg Subcutaneous Daily   ??? vancomycin  1,250 mg Intravenous Q8H   ??? insulin lispro  0-6 Units Subcutaneous TID WC   ??? insulin lispro  0-3 Units Subcutaneous Nightly   ??? methocarbamol  750 mg Oral 4x Daily   ??? lidocaine  1 patch Transdermal Daily   ??? mupirocin  1 g Nasal BID   ??? cefepime  2 g Intravenous Q12H   ??? sodium chloride flush  10 mL Intravenous 2 times  per day       Continuous Infusions:  ??? dextrose         PRN Meds:  HYDROmorphone, HYDROmorphone, morphine, HYDROmorphone, oxyCODONE **OR** oxyCODONE, diphenhydrAMINE, metoclopramide, promethazine, labetalol, hydrALAZINE, meperidine, HYDROmorphone, HYDROcodone 5 mg - acetaminophen, diazepam, traMADol, glucose, dextrose, glucagon (rDNA), dextrose, sodium chloride flush, ondansetron, loperamide      Assessment:     37 yo woman with substance abuse d/o, psychiatric d/o, chronic pain (neck, hx mult surg)  ??  Pt with active IV heroin use.  Presents with 4-5 day hx worsening LBP, assoc LE weakness, 1 episode of urinary incontinence  Pain worse on L and with L leg movement.    No trauma.  Reports fever / chills (no documented fever).    ??  Seen at [facility] ED - afeb, normal WBC.  BC sent  Transferred to [facility] for Neurosurg eval.    MRI L4/5 bilateral foraminal stenosis, 'no evidence of infection'  MRI T (T10 cyst) / C spine with no evidence of infection  ??  CRP 127.7, CRP 75  HIV NR  ??  Pt c/o aches, L side pain;  subjective fevers  L paraspinal pain with palpation  ??  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  Withdrawal / pain management per ICU team, Hospitalist    If pt demands discharge / 'signs out', would rx with po linezolid 600 mg po bid x 4 weeks  Weekly labs with ESR, CRP, CBC  ??  Discussed with pt, RN  Ander Slade, MD

## 2018-07-18 NOTE — Progress Notes (Signed)
Patient transferred to cath lab for TEE

## 2018-07-18 NOTE — Anesthesia Post-Procedure Evaluation (Signed)
Department of Anesthesiology  Postprocedure Note    Patient: [name] [name]  MRN: [id number]  Birthdate: 1981-05-18  Date of evaluation: 07/18/2018  Time:  9:41 AM     Procedure Summary     Date:  07/18/18 Room / Location:  [facility] Cath Lab    Anesthesia Start:  2725 Anesthesia Stop:  0909    Procedure:  CATH LAB WITH ANESTHESIA Diagnosis:      Scheduled Providers:   Responsible Provider:  Windy Kalata, MD    Anesthesia Type:  general ASA Status:  3          Anesthesia Type: general    Aldrete Phase I:      Aldrete Phase II:      Last vitals: Reviewed and per EMR flowsheets.       Anesthesia Post Evaluation    Patient location during evaluation: PACU  Patient participation: complete - patient participated  Level of consciousness: awake and alert  Pain score: 0  Airway patency: patent  Nausea & Vomiting: no nausea and no vomiting  Complications: no  Cardiovascular status: hemodynamically stable  Respiratory status: acceptable  Hydration status: euvolemic

## 2018-07-18 NOTE — Progress Notes (Signed)
PRE TEE SEDATION     Pre procedural Sedation Assessment Note - TEE    Pre - procedure Diagnosis:  Myositis, Bacteremia, IVDU    Planned procedure:  TEE    H&P reviewed   [name] [name] is a 37 y.o. female with a past medical hx of IVDU who presents with bacteremia and TTE with possible TV endocarditis.   Will proceed with TEE.     Nursing history and assessment - reviewed    Allergies:  Allergies   Allergen Reactions   ??? Latex Itching, Rash and Other (See Comments)     Burning to the touch    ??? Latex Rash       PMH:  Past Medical History:   Diagnosis Date   ??? Anxiety    ??? 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 Bipolar affective disorder ([place])    ??? Cervical fusion syndrome    ??? Chronic pain    ??? Depression    ??? Gestational diabetes mellitus    ??? Hepatitis A 07/14/2018   ??? Hepatitis C 08/02/2017   ??? 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   ??? History of heroin abuse    ??? HPV in female    ??? Miscarriage    ??? 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 Neck pain    ??? OCD (obsessive compulsive disorder)    ??? Paranoia ([place])    ??? Tobacco abuse        Outpatient Meds:  No medications prior to admission.    Inpatient Medications:  ??? enoxaparin  40 mg Subcutaneous Daily   ??? vancomycin  1,250 mg Intravenous Q8H   ??? insulin lispro  0-6 Units Subcutaneous TID WC   ??? insulin lispro  0-3 Units Subcutaneous Nightly   ??? methocarbamol  750 mg Oral 4x Daily   ??? lidocaine  1 patch Transdermal Daily   ??? mupirocin  1 g Nasal BID   ??? cefepime  2 g Intravenous Q12H   ??? sodium chloride flush  10 mL Intravenous 2 times per day       IV drips:  ??? dextrose         PRN:  HYDROmorphone, HYDROmorphone, morphine, HYDROmorphone, oxyCODONE **OR** oxyCODONE, diphenhydrAMINE, metoclopramide, promethazine, labetalol, hydrALAZINE, meperidine, HYDROmorphone, HYDROcodone 5 mg - acetaminophen, diazepam, traMADol, glucose, dextrose, glucagon (rDNA), dextrose, sodium chloride flush, ondansetron, loperamide    Physical Examination   Vitals:     07/18/18 0732   BP: 127/79   Pulse: 58   Resp: 15   Temp:    SpO2: 99%     General appearance - AAOX3  Chest - clear to auscultation  Heart - Regular heart sounds  Neck - No JVD    Airway assessment - Mallampati class - 3*    ASA classification - 3*    Labs:  Lab Results   Component Value Date    INR 1.24 (H) 07/14/2018    INR 1.28 (H) 07/13/2018    PROTIME 14.1 (H) 07/14/2018    PROTIME 14.5 (H) 07/13/2018       Planned anesthetic agent - anesthesia will provide sedation.     Sedation plan, risks, benefits. Potential complications and any alternative options associated with procedure and possible use of blood discussed with patient.   Informed consent obtained.    Plan:  Proceed with procedure    Elisha Ponder, MD  07/18/2018  10:00 AM

## 2018-07-18 NOTE — Care Coordination-Inpatient (Signed)
Case Management Notes:     Left message at  [facility] to follow up on referral sent yesterday.      Wyline Beady, RN, BSN  [facility]  Case Management Department  Ph: [phone]

## 2018-07-18 NOTE — Progress Notes (Signed)
TEE Note  - no evidence of endocarditis.   - no complications  - 0 ml blood loss.     [name] [name] R. Eugenio Hoes MD, [facility], [facility]

## 2018-07-18 NOTE — Plan of Care (Signed)
Problem: Pain:  Goal: Pain level will decrease  Description  Pain level will decrease  07/18/2018 0245 by Vara Guardian, RN  Outcome: Ongoing  Pt c/o severe pain to L hip and lower back. PRN medications given as needed. Will cont to monitor.    Problem: Falls - Risk of:  Goal: Will remain free from falls  Description  Will remain free from falls  07/18/2018 0245 by Vara Guardian, RN  Outcome: Ongoing  Pt is fall risk. Instructed to call for assistance with ambulation, call light in reach. Bed in lowest locked position, side rails up x 3, bed alarm on. Up to BR with assist x1, tolerated fairly well.     Problem: INFECTION  Goal: Absence of infection signs and symptoms  07/18/2018 0245 by Vara Guardian, RN  Outcome: Ongoing

## 2018-07-23 ENCOUNTER — Inpatient Hospital Stay: Admit: 2018-07-23 | Payer: PRIVATE HEALTH INSURANCE | Primary: Family

## 2018-07-23 ENCOUNTER — Inpatient Hospital Stay
Admission: EM | Admit: 2018-07-23 | Discharge: 2018-08-14 | Disposition: A | Discharge status: Home / Self Care | Payer: PRIVATE HEALTH INSURANCE

## 2018-07-23 DIAGNOSIS — M6008 Infective myositis, other site: Secondary | ICD-10-CM

## 2018-07-23 LAB — DIFFERENTIAL
Basophils Absolute: 55 /uL (ref 0–200)
Basophils Relative: 0.9 % (ref 0.0–1.0)
Eosinophils Absolute: 73 /uL (ref 15–500)
Eosinophils Relative: 1.2 % (ref 0.0–8.0)
Lymphocytes Absolute: 1373 /uL (ref 850–3900)
Lymphocytes Relative: 22.5 % (ref 15.0–45.0)
Monocytes Absolute: 397 /uL (ref 200–950)
Monocytes Relative: 6.5 % (ref 0.0–12.0)
Neutrophils Absolute: 4203 /uL (ref 1500–7800)
Neutrophils Relative: 68.9 % (ref 40.0–80.0)
nRBC: 0 /100 WBC (ref 0–0)

## 2018-07-23 LAB — URINE DRUG COMPREHENSIVE PANEL, CONFIRMATION
Cocaine Metabolite(benzoylecgonine): >200 ng/mL
Creatinine, Ur: 40 mg/dL
Fentanyl: >5 ng/mL
Hydromorphone: >400 ng/mL
Lorazepam: 4 ng/mL
Morphine: 7 ng/mL
Nordiazepam: 59 ng/mL
Norfentanyl: >50 ng/mL
Oxazepam: >200 ng/mL
Oxidant: NEGATIVE
Specific Gravity: 1.017 (ref 1.003–1.035)
THC-COOH: 30 ng/mL
Temazepam: 69 ng/mL
pH: 7.8 (ref 4.7–7.8)

## 2018-07-23 LAB — C-REACTIVE PROTEIN: CRP: 86.3 mg/L (ref 1.0–10.0)

## 2018-07-23 LAB — ED HCV AB REFLEX TO HCV QUANT
HCV Ab: REACTIVE
HCVAB Number: 12.29 S/CO (ref 0.00–0.79)

## 2018-07-23 LAB — BLOOD CULTURE-PERIPHERAL: Culture Result: NO GROWTH

## 2018-07-23 LAB — ED HIV 1+2 ANTIBODY/ANTIGEN WITH REFLEX: HIV 1+2 AB/AGN: NONREACTIVE

## 2018-07-23 LAB — HEPATITIS C, RNA, QUANT: International Units: <15 IU/mL

## 2018-07-23 LAB — BASIC METABOLIC PANEL
Anion Gap: 6 mmol/L (ref 3–16)
BUN: 13 mg/dL (ref 7–25)
CO2: 28 mmol/L (ref 21–33)
Calcium: 8.8 mg/dL (ref 8.6–10.3)
Chloride: 104 mmol/L (ref 98–110)
Creatinine: 0.52 mg/dL — ABNORMAL LOW (ref 0.60–1.30)
Glucose: 87 mg/dL (ref 70–100)
Osmolality, Calculated: 285 mosm/kg (ref 278–305)
Potassium: 5.2 mmol/L (ref 3.5–5.3)
Sodium: 138 mmol/L (ref 133–146)
eGFR AA CKD-EPI: >90 See note.
eGFR NONAA CKD-EPI: >90 See note.

## 2018-07-23 LAB — CBC
Hematocrit: 31.2 % (ref 35.0–45.0)
Hemoglobin: 10.5 g/dL (ref 11.7–15.5)
MCH: 27.6 pg (ref 27.0–33.0)
MCHC: 33.5 g/dL (ref 32.0–36.0)
MCV: 82.3 fL (ref 80.0–100.0)
MPV: 8.8 fL (ref 7.5–11.5)
Platelets: 252 10*3/uL (ref 140–400)
RBC: 3.8 10*6/uL (ref 3.80–5.10)
RDW: 14.9 % (ref 11.0–15.0)
WBC: 6.1 10*3/uL (ref 3.8–10.8)

## 2018-07-23 LAB — LACTIC ACID, VENOUS, WHOLE BLOOD, UCMC: Lactate, Ven: 0.9 mmol/L (ref 0.5–2.2)

## 2018-07-23 LAB — PREGNANCY QUALITATIVE, SERUM, UCMC ONLY: Preg, Serum: NEGATIVE

## 2018-07-23 LAB — BC RAPID MOLECULAR ID - GRAM NEGATIVE ASSAY

## 2018-07-23 LAB — CK: Total CK: 249 U/L (ref 30–223)

## 2018-07-23 LAB — SED RATE: Sed Rate: 75 mm/h — ABNORMAL HIGH (ref 0–20)

## 2018-07-23 MED ORDER — naloxone (NARCAN) injection 0.4 mg
0.4 | INTRAMUSCULAR | Status: AC | PRN
Start: 2018-07-23 — End: 2018-07-29

## 2018-07-23 MED ORDER — vancomycin (VANCOCIN) 1,000 mg in sodium chloride 0.9% 250 mL ADDaptor IVPB
Freq: Two times a day (BID) | INTRAVENOUS | Status: AC
Start: 2018-07-23 — End: 2018-07-23

## 2018-07-23 MED ORDER — ibuprofen (ADVIL,MOTRIN) tablet 400 mg
400 | ORAL | Status: AC | PRN
Start: 2018-07-23 — End: 2018-07-29
  Administered 2018-07-24 – 2018-07-29 (×6): 400 mg via ORAL

## 2018-07-23 MED ORDER — glucose chewable tablet 12 g
4 | ORAL | Status: AC | PRN
Start: 2018-07-23 — End: 2018-07-30

## 2018-07-23 MED ORDER — HYDROmorphone (DILAUDID) injection Syrg 1.5 mg
1 | Freq: Once | INTRAMUSCULAR | Status: AC
Start: 2018-07-23 — End: 2018-07-23
  Administered 2018-07-23: 16:00:00 1.5 mg via INTRAVENOUS

## 2018-07-23 MED ORDER — insulin lispro (humaLOG) injection 0-5 Units
100 | Freq: Three times a day (TID) | SUBCUTANEOUS | Status: AC
Start: 2018-07-23 — End: 2018-07-30

## 2018-07-23 MED ORDER — dextrose 10 % (D10W) in water infusion
10 | INTRAVENOUS | Status: AC | PRN
Start: 2018-07-23 — End: 2018-07-30

## 2018-07-23 MED ORDER — cloNIDine HCl (CATAPRES) tablet 0.1 mg
0.1 | Freq: Two times a day (BID) | ORAL | Status: AC | PRN
Start: 2018-07-23 — End: 2018-07-29
  Administered 2018-07-24 (×2): 0.1 mg via ORAL

## 2018-07-23 MED ORDER — oxyCODONE (ROXICODONE) immediate release tablet 5 mg
5 | Freq: Four times a day (QID) | ORAL | Status: AC | PRN
Start: 2018-07-23 — End: 2018-07-23

## 2018-07-23 MED ORDER — acetaminophen (TYLENOL) tablet 975 mg
325 | Freq: Three times a day (TID) | ORAL | Status: AC
Start: 2018-07-23 — End: 2018-08-14
  Administered 2018-07-24 – 2018-08-14 (×50): 975 mg via ORAL

## 2018-07-23 MED ORDER — loperamide (IMODIUM) capsule 2 mg
2 | ORAL | Status: AC | PRN
Start: 2018-07-23 — End: 2018-07-29

## 2018-07-23 MED ORDER — hydrOXYzine pamoate (VISTARIL) capsule 50 mg
25 | Freq: Once | ORAL | Status: AC
Start: 2018-07-23 — End: 2018-07-23
  Administered 2018-07-23: 22:00:00 50 mg via ORAL

## 2018-07-23 MED ORDER — vancomycin (VANCOCIN) 1,500 mg in sodium chloride 0.9 % 250 mL IVPB
Freq: Once | INTRAVENOUS | Status: AC
Start: 2018-07-23 — End: 2018-07-23
  Administered 2018-07-23: 14:00:00 1500 mg/kg via INTRAVENOUS

## 2018-07-23 MED ORDER — lidocaine (LIDODERM) 5 % 1 patch
5 | TOPICAL | Status: AC
Start: 2018-07-23 — End: 2018-08-14
  Administered 2018-07-23 – 2018-08-13 (×19): 1 via TRANSDERMAL

## 2018-07-23 MED ORDER — lactated Ringers 1,000 mL bolus
Freq: Once | INTRAVENOUS | Status: AC
Start: 2018-07-23 — End: 2018-07-24

## 2018-07-23 MED ORDER — HYDROmorphone (DILAUDID) injection Syrg 1 mg
1 | Freq: Once | INTRAMUSCULAR | Status: AC
Start: 2018-07-23 — End: 2018-07-23
  Administered 2018-07-23: 22:00:00 1 mg via INTRAVENOUS

## 2018-07-23 MED ORDER — ondansetron (ZOFRAN) tablet 4 mg
4 | Freq: Four times a day (QID) | ORAL | Status: AC | PRN
Start: 2018-07-23 — End: 2018-07-29

## 2018-07-23 MED ORDER — acetaminophen (TYLENOL) tablet 650 mg
325 | ORAL | Status: AC | PRN
Start: 2018-07-23 — End: 2018-07-23

## 2018-07-23 MED ORDER — vancomycin (VANCOCIN) 1,000 mg in sodium chloride 0.9% 250 mL ADDaptor IVPB
Freq: Three times a day (TID) | INTRAVENOUS | Status: AC
Start: 2018-07-23 — End: 2018-07-25
  Administered 2018-07-24 – 2018-07-25 (×6): 1000 mg/kg via INTRAVENOUS

## 2018-07-23 MED ORDER — heparin (porcine) injection 5,000 Units
5000 | Freq: Three times a day (TID) | INTRAMUSCULAR | Status: AC
Start: 2018-07-23 — End: 2018-08-09
  Administered 2018-07-24 – 2018-08-06 (×17): 5000 [IU] via SUBCUTANEOUS

## 2018-07-23 MED ORDER — cefepime (MAXIPIME) 2 g in sodium chloride 0.9% 100 mL IVPB ADDaptor
Freq: Two times a day (BID) | INTRAVENOUS | Status: AC
Start: 2018-07-23 — End: 2018-08-14
  Administered 2018-07-24 – 2018-08-14 (×42): 2 g via INTRAVENOUS

## 2018-07-23 MED ORDER — piperacillin-tazobactam (ZOSYN) 3.375 g in sodium chloride 0.9% 100 mL ADV IVPB
Freq: Once | INTRAVENOUS | Status: AC
Start: 2018-07-23 — End: 2018-07-23
  Administered 2018-07-23: 16:00:00 3.375 g via INTRAVENOUS

## 2018-07-23 MED ORDER — buprenorphine HCl (SUBUTEX) SL tablet 4 mg
2 | Freq: Once | SUBLINGUAL | Status: AC | PRN
Start: 2018-07-23 — End: 2018-07-23
  Administered 2018-07-24: 4 mg via SUBLINGUAL

## 2018-07-23 MED ORDER — buprenorphine HCl (SUBUTEX) SL tablet 4 mg
2 | Freq: Once | SUBLINGUAL | Status: AC | PRN
Start: 2018-07-23 — End: 2018-07-23
  Administered 2018-07-23: 20:00:00 4 mg via SUBLINGUAL

## 2018-07-23 MED ORDER — oxyCODONE (ROXICODONE) immediate release tablet 5 mg
5 | Freq: Four times a day (QID) | ORAL | Status: AC | PRN
Start: 2018-07-23 — End: 2018-07-24
  Administered 2018-07-23 – 2018-07-24 (×3): 5 mg via ORAL

## 2018-07-23 MED ORDER — acetaminophen (TYLENOL) tablet 975 mg
325 | ORAL | Status: AC
Start: 2018-07-23 — End: 2018-07-23

## 2018-07-23 MED ORDER — HYDROmorphone (DILAUDID) injection Syrg 1 mg
1 | Freq: Once | INTRAMUSCULAR | Status: AC
Start: 2018-07-23 — End: 2018-07-23
  Administered 2018-07-23: 14:00:00 1 mg via INTRAVENOUS

## 2018-07-23 MED FILL — OXYCODONE 5 MG TABLET: 5 5 MG | ORAL | Qty: 1

## 2018-07-23 MED FILL — HYDROXYZINE PAMOATE 25 MG CAPSULE: 25 25 MG | ORAL | Qty: 2

## 2018-07-23 MED FILL — SODIUM CHLORIDE 0.9 % INTRAVENOUS PIGGYBACK: 3.38 3.38 g | INTRAVENOUS | Qty: 100

## 2018-07-23 MED FILL — HYDROMORPHONE 1 MG/ML INJECTION SYRINGE: 1 1 mg/mL | INTRAMUSCULAR | Qty: 1

## 2018-07-23 MED FILL — LIDODERM 5 % TOPICAL PATCH: 5 5 % | TOPICAL | Qty: 1

## 2018-07-23 MED FILL — SODIUM CHLORIDE 0.9 % INTRAVENOUS PIGGYBACK: 15.00 15.00 mg/kg | INTRAVENOUS | Qty: 250

## 2018-07-23 MED FILL — HYDROMORPHONE 1 MG/ML INJECTION SYRINGE: 1 1 mg/mL | INTRAMUSCULAR | Qty: 2

## 2018-07-23 MED FILL — PIPERACILLIN-TAZOBACTAM 3.375 GRAM INTRAVENOUS SOLUTION: 3.375 3.375 gram | INTRAVENOUS | Qty: 1

## 2018-07-23 MED FILL — BUPRENORPHINE HCL 2 MG SUBLINGUAL TABLET: 2 2 mg | SUBLINGUAL | Qty: 2

## 2018-07-23 MED FILL — VANCOMYCIN 1,000 MG INTRAVENOUS INJECTION: 1000 1000 mg | INTRAVENOUS | Qty: 1000

## 2018-07-23 MED FILL — VANCOMYCIN 1,000 MG INTRAVENOUS INJECTION: 1000 1000 mg | INTRAVENOUS | Qty: 1500

## 2018-07-23 MED FILL — TYLENOL 325 MG TABLET: 325 325 mg | ORAL | Qty: 3

## 2018-07-23 MED FILL — INSULIN LISPRO 100 UNIT/ML SUB-Q (NO 2ND CHECK): 100 100 unit/mL | SUBCUTANEOUS | Qty: 3

## 2018-07-23 MED FILL — IBUPROFEN 200 MG TABLET: 200 200 MG | ORAL | Qty: 2

## 2018-07-23 MED FILL — HEPARIN (PORCINE) 5,000 UNIT/ML INJECTION SOLUTION: 5000 5,000 unit/mL | INTRAMUSCULAR | Qty: 1

## 2018-07-23 NOTE — Nursing Note (Signed)
When patient first arrived 7N, MD came and assessed patient. Orders for PRN 5 mg Oxycodon and Subutex were placed by MD after assessment. PRNs were given shortly after. 20 minutes after medication was given patient was cussing and throwing cell phone when this RN walked into the room. Patient yelled I need some damn pain meds now. 7N team was called. Before MD arrived patient was screaming and cussing out in pain stating she wanted to leave AMA. MD came in shortly after and discussed with patient why she should not leave AMA and placed orders for IV Diluadid and a lidocane patch. Patient was given the medications. Currently patient is still complaining of pain. MD tea contacted again. Will continue to monitor.

## 2018-07-23 NOTE — ED Notes (Signed)
Lunch ordered for pt.

## 2018-07-23 NOTE — H&P (Signed)
Department of Internal Medicine  History & Physical    Patient: Anne Goodwin  MRN: [id number]  CSN: [id number]    Chief Complaint   Left hip and back pain.     History of Present Illness     Ethleen Fredericks is a 37 y.o. female with a history of IVDU (Last use yesterday) and T2DM presenting to the hospital with left hip pain. She was evaluated to [facility] hospital for this pain on 05/12/18 and had an MRI of the pelvis showing myositis involving the L piriformis and gluteus maximus muscles and effusion in the left SI joint that could be infectious in nature. She was admitted and started on Vancomycin and Cefepime. She had an MRI of her L spine that did not show any evidence of infection.  She had a TEE on 07/18/18 that did not show any evidence of endocarditis. She signed out AMA on 07/18/18 after her TEE. She was given a script for Linezolid, but she did not fill it. myositis and was started on Vancomycin and Cefepime. and left AMA on 07/18/18. She had blood cultures from 07/13/18 that showed no growth. She is actively using heroin and her last use was yesterday.     She reports that her pain has not improved. She presents with pain mainly in left hip that is worse with left leg movement. She also endorses fevers and chills. She reports that she has had trouble walking secondary to her pain. She report that she has not had any urinary retention, but that she has not had a bowel movement in a while. She states that her last bowel movement was [facility], but she doesn't recall when.     In the ED, patient was afebrile and was vitally stable. Her WBC count was 6.1. Her ESR and CRP was 75 and 86.3 respectively. Per ED physician, Ortho evaluated the patient and viewed previous imaging from OSH and reported that there was no plan for surgical intervention at this time.         Review of Systems     General ROS: +fatigue, fever, chills  Ophthalmic ROS: -vision changes  Ears: no change in hearing  Allergy  and Immunology ROS: -rashes, swelling  Hematological and Lymphatic ROS: -bruise, bleeding  Respiratory ROS: -SOB, wheezing  Cardiovascular ROS: -chest pain, palpitations  Gastrointestinal ROS: -N/V/D/C or abd pain  Genito-Urinary ROS: -dysuria  Musculoskeletal ROS: +left hip pain  Neurological ROS: -dizzy, lightheaded, LOC  Dermatological ROS: - rashes, lesions, itching    Past Medical History     Past Medical History:   Diagnosis Date    Abnormal Pap smear     Anxiety     Back pain     Bipolar disorder (CMS Dx)     Depression     Diabetes mellitus (CMS Dx)     Ectopic pregnancy     Hashimoto's thyroiditis     OCD (obsessive compulsive disorder)     Pituitary adenoma (CMS Dx)          Past Surgical History     Past Surgical History:   Procedure Laterality Date    CERVICAL FUSION      CESAREAN SECTION           Family History     Family History   Problem Relation Age of Onset    Cancer Maternal Grandfather     Cancer Paternal Grandfather     Hyperlipidemia Paternal Grandmother  Depression Mother     Depression Sister     Depression Brother          Social History     Social History     Social History    Marital status: Divorced     Spouse name: N/A    Number of children: N/A    Years of education: N/A     Occupational History    Not on file.     Social History Main Topics    Smoking status: Current Every Day Smoker     Packs/day: 0.50     Types: Cigarettes    Smokeless tobacco: Never Used    Alcohol use No    Drug use: Yes     Frequency: 1.0 time per week     Types: Heroin    Sexual activity: No     Other Topics Concern    Not on file     Social History Narrative    No narrative on file         Medications     Home Meds:  Prior to Admission medications    Medication Sig Start Date End Date Taking? Authorizing Provider   acetaminophen (TYLENOL) 650 MG suppository Place 650 mg rectally every 4 hours as needed.    Historical Provider, MD   citalopram (CELEXA) 10 MG tablet Take 10 mg by  mouth daily.    Historical Provider, MD   cyclobenzaprine (FLEXERIL) 10 MG tablet Take 10 mg by mouth 3 times a day as needed for Muscle spasms.    Historical Provider, MD   docusate sodium (COLACE) 100 MG capsule Take 100 mg by mouth 2 times a day.    Historical Provider, MD   folic acid (FOLVITE) 1 MG tablet Take 1 mg by mouth daily.    Historical Provider, MD   gabapentin (NEURONTIN) 300 MG capsule  12/12/14   Historical Provider, MD   glyBURIDE (DIABETA) 5 MG tablet Take 5 mg by mouth daily with breakfast.    Historical Provider, MD   HYDROcodone-acetaminophen (NORCO) 5-325 mg per tablet  12/12/14   Historical Provider, MD   hydrOXYzine pamoate (VISTARIL) 50 MG capsule Take 1 capsule (50 mg total) by mouth every 3 hours as needed for Itching. 04/04/13   Marda Stalker, MD   meloxicam [facility]) 15 MG tablet  12/12/14   Historical Provider, MD   metFORMIN (GLUCOPHAGE) 1000 MG tablet  11/11/14   Historical Provider, MD   naloxone [facility]) 4 mg/actuation Spry Insert into either nostril then completely depress plunger to give dose of naloxone.  Use as needed for opiate/narcotic overdose that leads to decreased breathing or no breathing 04/09/16   Ripley Fraise, MD   ondansetron (ZOFRAN-ODT) 4 MG disintegrating tablet Take 4 mg by mouth every 8 hours as needed for Nausea.    Historical Provider, MD   oxyCODONE-acetaminophen (PERCOCET) 5-325 mg per tablet  12/12/14   Historical Provider, MD   PNV with calcium no.72-iron-FA (PRENATAL PLUS WITH IRON, CA,) 27 mg iron- 1 mg Tab tablet Take 1 tablet by mouth daily.    Historical Provider, MD   traZODone (DESYREL) 50 MG Take 25 mg by mouth at bedtime.    Historical Provider, MD          Inpatient Meds:  Scheduled:    Continuous:      PRN:      Vital Signs     Temp:  [97.6 F (36.4 C)-98.1 F (36.7 C)] 98.1 F (  36.7 C)  Heart Rate:  [70-71] 71  Resp:  [16-18] 16  BP: (124-133)/(78-83) 133/78  No intake or output data in the 24 hours ending 07/23/18 1519        Physical Exam      Physical Exam   Constitutional: She is oriented to person, place, and time. She appears well-developed and well-nourished.   Anxious   Eyes: Pupils are equal, round, and reactive to light. Conjunctivae and EOM are normal. No scleral icterus.   Neck: Normal range of motion. Neck supple. No JVD present.   Cardiovascular: Normal rate, regular rhythm, normal heart sounds and intact distal pulses.    No murmur heard.  Pulmonary/Chest: Effort normal and breath sounds normal. No respiratory distress. She has no wheezes. She has no rales.   Abdominal: Soft. Bowel sounds are normal. She exhibits no distension. There is no tenderness. There is no guarding.   Musculoskeletal: Normal range of motion.   Lymphadenopathy:     She has no cervical adenopathy.   Neurological: She is alert and oriented to person, place, and time.   Upper extremities strength 5/5  Moves lower extremities, but difficult to examine lower extremity strength due to patient's pain.  No sensory deficit.   Skin: Skin is warm and dry. No erythema.   Psychiatric:   Anxious  Poor eye contact  Depressed mood         Laboratory Data     CBC 07/23/2018                 \    10.5   /         6.1  \______/ 252                 /            \                             /   31.2    \ Differential 07/23/2018    N 68.9 L 22.5 M 6.5 E 1.2 B 0.9      Renal 07/23/2018       138      104        13    /    ___ _____________/ 87                                     \     5.2        28      0.52   \  Ca     8.8 (07/23/2018)  Mg     No results found for requested labs within last 29528 hours. (No results found for requested labs within last 41324 hours.)  Phos No results found for requested labs within last 40102 hours. (No results found for requested labs within last 72536 hours.) Lipids    Lab Results   Component Value Date    CHOLTOT 196 05/09/2007    TRIG 338 (H) 05/09/2007    HDL 40 05/09/2007    LDL 88 MG/DL (CALC) 64/40/3474      LFTs No results found for requested  labs within last 25956 hours.               \      No results found for requested  labs within last 01027 hours.      /              \    No results found for requested labs within last 25366 hours.    /         No results found for requested labs within last 44034 hours.  \______/  No results found for requested labs within last 74259 hours.               /            \              /    No results found for requested labs within last 56387 hours.      \ PT/INR/PTT - No results found for requested labs within last 56433 hours.    PT  No results found for requested labs within last 29518 hours. INR  No results found for requested labs within last 84166 hours. PTT  No results found for requested labs within last 06301 hours.                          Invalid input(s): Jac Canavan        Lab 07/23/18  0959   CK TOTAL 249*       No results found for: NTPROBNP    Lab Results   Component Value Date    TSH 1.27 05/09/2007                 Diagnostic Studies     MRI Pelvis W and WO contrast    (Results Pending)   MRI Lumbar spine W WO contrast    (Results Pending)       Assessment & Plan     Annalei Massaquoi is a 37 y.o. female being admitted to the hospital for IVDU and T2DM. 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 Patient was admitted from 07/13/18 to 07/18/18 and was getting Vanc and cefepime. She had an TEE on 07/18/18 that did not show any signs of infective endocarditis. She left AMA after her TEE. She was given Linezolid to fill when she left AMA, but she never got it. She presents today complaining of increased pain in her left hip worsened with moving her leg and trouble with walking secondary to  her pain. She had MRI L spine on 07/13/18 that did not show any spine involvement of her infection. Ortho evaluated her today and they do not anticipate any immediate surgical intervention, but they will follow her.  -Repeat MRI pelvis and L spine.  -Vanc and Cefepime ordered  -Pain control with oxycodone 5 mg PO q6h PRN and Tylenol 975 mg PO TID    #T2DM  Patient has a history of type 2 diabetes, but she does not take any medications at home. Her last A1c on 07/15/18 at OSH was 6.5.  -LDSSI ordered    #IVDU  Patient uses Heroin and fentanyl and her last use was yesterday. Her Hep A igM, Hep B core IgM, and Hep C ab were all reactive on 07/14/18 at OSH.  HIV testing was negative at OSH.  -Hep B DNA quant and Hep C DNA quant ordered  -COWS protocol for withdrawal started.    #Possible Hepatitis  Her Hep A igM, Hep B core IgM, and Hep C ab were all reactive on 07/14/18 at OSH  -Hep B DNA quant and Hep C DNA quant ordered.  -ID consulted.    Fluids: LR 1 liter bolus  Electrolytes: Replace as necessary  Nutrition:  Diet Orders          Diet NPO past midnight Except for: except sips with meds starting at 09/22 2359    Diet regular starting at 09/22 1400        GI/GU ppx: Not indicated  DVT ppx: Hep subQ  Code Status: Full Code    Signed:  Levin Bacon, MD  07/23/2018, 3:19 PM

## 2018-07-23 NOTE — ED Provider Notes (Signed)
ED Attending Attestation Note    Date of service:  07/23/2018    This patient was seen by the resident physician.  I have seen and examined the patient, agree with the workup, evaluation, management and diagnosis. The care plan has been discussed and I concur.     My assessment reveals a 37 y.o. female who presents with chief complaint of leg pain.  Patient with known pyomyositis from recent MRI at outside hospital, due to IV drug use, back with worsening left leg pain.  Left other hospital AGAINST MEDICAL ADVICE.  Patient able to ambulate with pain.  Able to move left hip but with pain.Marland Kitchen

## 2018-07-23 NOTE — Unmapped (Signed)
[facility] Service: Vancomycin Monitoring Consult       Anne Goodwin is a 37 y.o. female currently being treated for myositis.    Patient is allergic to latex, natural rubber.    Pharmacy consulted for vancomycin management by [facility] medicine team.      Current Anti-Infectives       Dose Frequency Start End    cefepime (MAXIPIME) 2 g in sodium chloride 0.9% 100 mL IVPB ADDaptor 2 g Every 12 hours 07/23/2018     Sig: Intravenous 2 g every 12 hours.          Route: Intravenous    piperacillin-tazobactam (ZOSYN) 3.375 g in sodium chloride 0.9% 100 mL ADV IVPB 3.375 g Once 07/23/2018 07/23/2018    Sig: Intravenous 3.375 g once.          Route: Intravenous    vancomycin (VANCOCIN) 1,000 mg in sodium chloride 0.9% 250 mL ADDaptor IVPB 15 mg/kg ?? 60.8 kg Every 12 hours 07/23/2018     Sig: Intravenous 1,000 mg every 12 hours.          Route: Intravenous    vancomycin (VANCOCIN) 1,500 mg in sodium chloride 0.9 % 250 mL IVPB 25 mg/kg ?? 60.8 kg Once 07/23/2018 07/23/2018    Sig: Intravenous 1,500 mg once.          Route: Intravenous    vancomycin (VANCOCIN) 1,000 mg in sodium chloride 0.9% 250 mL ADDaptor IVPB (Discontinued) 15 mg/kg ?? 60.8 kg Every 12 hours 07/23/2018 07/23/2018    Sig: Intravenous 1,000 mg every 12 hours.          Route: Intravenous        documented within (last 72 hours)     Date/Time Action Medication Dose Rate    07/23/18 1003 Given    vancomycin (VANCOCIN) 1,500 mg in sodium chloride 0.9 % 250 mL IVPB 1,500 mg 167 mL/hr          --Objective Data--     Vitals:    07/23/18 0923 07/23/18 0924 07/23/18 1246   BP:  124/83 133/78   Pulse:  70 71   Resp:  18 16   Temp:  97.6 ??F (36.4 ??C) 98.1 ??F (36.7 ??C)   TempSrc:  Oral    SpO2:  99% 98%   Weight: 134 lb (60.8 kg)         No intake/output data recorded.    WBC, BUN, Creatinine     WBC   BUN   Creatinine      6.1 07/23/18 0959 13 07/23/18 0959 0.52 (!) 07/23/18 0959          Patient height not recorded    --Cultures--     Microbiology Results      Date and Time Order Name Sensitivity Status Organisms Specimen ID Source    07/23/2018 1118 Blood culture-Peripheral #1  In process  [id number] Peripheral    07/23/2018 0959 Blood culture-Peripheral #2  Preliminary  [id number] Peripheral          --Vancomycin Concentrations--     Lab Results          No relevant labs found             --Assessment and Plan--        Patient is a 37 y.o. female being treated with vancomycin for myositis.    ?? Adjust current dose to vancomycin 1000 mg IV q8h.  ?? Check vancomycin serum trough concentration prior to 4th dose  of current regimen. Goal serum trough concentration is 10-20 mg/L.  ?? Check renal panel daily until stable regimen is determined, then as clinically indicated. Avoid nephrotoxins as able.   ?? Pharmacy will continue to follow for the monitoring of therapy efficacy and toxicity. Please call if you have any questions.         Thank you for the consult.    Althea Grimmer, PharmD  Clinical Pharmacy Specialist  Pager: [phone]  On-Call/Weekend Pager: [phone]  07/23/18  2:59 PM

## 2018-07-23 NOTE — Unmapped (Signed)
Ready and clean bed assigned to [id number]. Pt updated on plan of care including transfer and is agreeable. Receiving RN may call [phone] to consult ED RN with questions regarding patients care. Pt is leaving the department in stable condition with all personal items in possession.   Ordered medications that have been received from Pharmacy .  The patient DOES NOT have a patient monitor at bedside in the ED.  Most recent vitals: BP 133/78    Pulse 71    Temp 98.1 ??F (36.7 ??C)    Resp 16    Wt 134 lb (60.8 kg)    SpO2 98%    BMI 22.30 kg/m??     Williemae Area ,RN

## 2018-07-23 NOTE — ED Triage Notes (Signed)
Leg pain and reports it might be form an infection

## 2018-07-23 NOTE — Unmapped (Signed)
Brought pt down on 9/22 for MRI of Lumbar Spine and Pelvis w/ and w/o contrast. After first image acquisition of Lumbar Spine, pt refused to continue any more of the test and could not be talked into trying anything to help get her through the test. Test aborted and pt sent back to room. Team notified.

## 2018-07-23 NOTE — Unmapped (Signed)
[place] ED Note    Date of Service: 07/23/2018    Reason for Visit: Leg Pain      Patient History     HPI:  This is a 37 y.o. female presenting with leg pain.    Patient presenting with left leg pain.  She was recently discharged from [facility] on the 17th AGAINST MEDICAL ADVICE after she had been admitted for pelvic osteomyelitis/pyomyositis.  She was prescribed a course of antibiotics but she has not filled these antibiotics reports that her symptoms have progressed, she is experiencing more profound pain.  She reports some weakness in the left lower extremity but denies any numbness.  She reports occasional episodes of urinary incontinence but denying any fecal incontinence.  She does report some lower lumbar back pain.  During her [facility] admission has had MRI of spines and pelvis, no spinal disease noted.    Past Medical History:   Diagnosis Date   ??? Abnormal Pap smear    ??? Anxiety    ??? Back pain    ??? Bipolar disorder (CMS Dx)    ??? Depression    ??? Diabetes mellitus (CMS Dx)    ??? Ectopic pregnancy    ??? Hashimoto's thyroiditis    ??? OCD (obsessive compulsive disorder)    ??? Pituitary adenoma (CMS Dx)        Past Surgical History:   Procedure Laterality Date   ??? CERVICAL FUSION     ??? CESAREAN SECTION         Anne Goodwin  reports that she has been smoking Cigarettes.  She has been smoking about 0.50 packs per day. She has never used smokeless tobacco. She reports that she uses drugs, including Heroin, about 1 time per week. She reports that she does not drink alcohol.    There are no discharge medications for this patient.      Allergies:   Allergies as of 07/23/2018 - Fully Reviewed 07/23/2018   Allergen Reaction Noted   ??? Latex, natural rubber  04/04/2013       PMH: Nursing notes reviewed   PSH: Nursing notes reviewed   FH: Nursing notes reviewed   MEDS: Nursing notes and chart reviewed     Review of Systems     ROS:   Of particular note the patient  reports pain.  Denies fevers, chills. The remainder of the 10 system review of systems was completed and was negative except as mentioned in HPI.      Physical Exam     ED Triage Vitals [07/23/18 0924]   Vital Signs Group      Temp 97.6 ??F (36.4 ??C)      Temp Source Oral      Heart Rate 70      Heart Rate Source       Resp 18      SpO2 99 %      BP 124/83      MAP (mmHg) 92      BP Location       BP Method       Patient Position    SpO2 99 %   O2 Device        General:  well nourished; well developed; in no apparent distress   HEENT:  normocephalic, atraumatic;  moist mucous membrane  Neck:  Neck supple, trachea midline   Pulmonary:  Lung sounds clear to auscultation bilaterally with good air entry; no respiratory distress; no wheezes or rales  Cardiac:  Regular rate and rhythm with no murmurs, rubs, or gallops   Abdomen:  Soft; non-tender, non-distended; no rebound or guarding  Musculoskeletal:  Atraumatic exam with no focal swelling . Diffuse TTP to the L hip. Normal distal neurovascular exam. Normal strength.   Skin:  Warm and well perfused without rashes or lesions   Neuro:  Alert and oriented x 3, strength and sensation grossly intact  Psych:  appropriate mood and affect       Diagnostic Studies     Labs:  Please see Epic for full details    Radiology:  MRI Pelvis W and WO contrast   Final Result      X-ray Pelvis 1 or 2-views   Final Result   IMPRESSION:   No acute abnormality.      Report Verified by: Zane Herald, MD at 07/23/2018 10:15 PM EDT      MRI Lumbar spine limited WO   Final Result   IMPRESSION:      Limited study with no suggestion of discitis osteomyelitis in the lumbar spine.      Report Verified by: Cleatis Polka, MD at 07/23/2018 8:40 PM EDT          EKG:  None    Emergency Department Procedures       ED Course and MDM     Anne Goodwin is a 37 y.o. female with a history and presentation as described above in HPI.  The patient was evaluated by myself  and the ED Attending Physician. All  management and disposition plans were discussed and agreed upon.    Patient presenting for infective myositis after leaving AMA from OSH. Orthopedics consulted. No acute intervention. Will admit to medicine for further care. Received broad spectrum antibiotics in the ED after blood cultures obtained. Does not appear septic.          Summary of Treatment in ED:  Medications   heparin (porcine) injection 5,000 Units (5,000 Units Subcutaneous Not Given 07/26/18 0512)   lactated Ringers 1,000 mL bolus ( Intravenous Not Given 07/23/18 1630)   glucose chewable tablet 12 g (not administered)   dextrose 10 % (D10W) in water infusion (not administered)     Or   dextrose 10 % (D10W) in water infusion (not administered)   insulin lispro (humaLOG) injection 0-5 Units (0 Units Subcutaneous Not Given 07/26/18 0944)   naloxone [facility] injection 0.4 mg (not administered)   loperamide (IMODIUM) capsule 2 mg (not administered)   cloNIDine HCl (CATAPRES) tablet 0.1 mg (0.1 mg Oral Given 07/24/18 1519)   ondansetron (ZOFRAN) tablet 4 mg (not administered)   ibuprofen (ADVIL,MOTRIN) tablet 400 mg (400 mg Oral Given 07/24/18 1554)   cefepime (MAXIPIME) 2 g in sodium chloride 0.9% 100 mL IVPB ADDaptor (2 g Intravenous New Bag 07/26/18 0304)   acetaminophen (TYLENOL) tablet 975 mg (975 mg Oral Not Given 07/26/18 0512)   lidocaine (LIDODERM) 5 % 1 patch (1 patch Transdermal Patch Removed 07/26/18 0512)   senna-docusate (SENNA-S) 8.6-50 mg per tablet 2 tablet (2 tablets Oral Not Given 07/26/18 0942)   polyethylene glycol (MIRALAX) packet 17 g (17 g Oral Not Given 07/26/18 0942)   methadone (DOLOPHINE) 5 mg/5 mL oral solution 25 mg (25 mg Oral Given 07/26/18 0940)   hydrOXYzine HCl (ATARAX) tablet 10 mg (10 mg Oral Given 07/25/18 0151)   oxyCODONE (ROXICODONE) immediate release tablet 10 mg (10 mg Oral Given 07/26/18 0649)     Or   oxyCODONE (ROXICODONE) immediate  release tablet 5 mg ( Oral See Alternative 07/26/18 0649)   cyclobenzaprine (FLEXERIL)  tablet 5 mg (5 mg Oral Given 07/26/18 0940)   gabapentin (NEURONTIN) capsule 300 mg (300 mg Oral Given 07/26/18 0940)   melatonin Tab 6 mg (6 mg Oral Given 07/25/18 2205)   HYDROmorphone (DILAUDID) injection Syrg 1 mg (1 mg Intravenous Given 07/23/18 1001)   vancomycin (VANCOCIN) 1,500 mg in sodium chloride 0.9 % 250 mL IVPB (1,500 mg Intravenous Given 07/23/18 1003)   piperacillin-tazobactam (ZOSYN) 3.375 g in sodium chloride 0.9% 100 mL ADV IVPB (3.375 g Intravenous Given 07/23/18 1213)   HYDROmorphone (DILAUDID) injection Syrg 1.5 mg (1.5 mg Intravenous Given 07/23/18 1226)   buprenorphine HCl (SUBUTEX) SL tablet 4 mg (4 mg Sublingual Given 07/23/18 2028)   buprenorphine HCl (SUBUTEX) SL tablet 4 mg (4 mg Sublingual Given 07/23/18 1538)   hydrOXYzine pamoate (VISTARIL) capsule 50 mg (50 mg Oral Given 07/23/18 1733)   HYDROmorphone (DILAUDID) injection Syrg 1 mg (1 mg Intravenous Given 07/23/18 1733)   buprenorphine HCl (SUBUTEX) SL tablet 4 mg (4 mg Sublingual Given 07/24/18 0914)   oxyCODONE (ROXICODONE) immediate release tablet 5 mg (5 mg Oral Given 07/24/18 0914)   LORazepam (ATIVAN) injection 0.5 mg (0.5 mg Intravenous Given 07/24/18 1653)   gadobutrol (GADAVIST) 1 mmol/1 mL IV syringe 6 mL (6 mLs Intravenous Given 07/24/18 1829)   ketorolac (TORADOL) injection 15 mg (15 mg Intravenous Given 07/25/18 0403)   LORazepam (ATIVAN) tablet 1 mg (1 mg Oral Given 07/25/18 0411)   diphenhydrAMINE (BENADRYL) capsule 50 mg (50 mg Oral Given 07/25/18 2205)         Impression     1. Leg pain    2. Infective myositis of left lower extremity    3. At risk for inadequate pain control    4. Serratia sepsis (CMS Dx)    5. Opioid use disorder, severe, dependence (CMS Dx)    6. Stimulant use disorder           Bonnee Quin, MD, PGY-3   [facility]           Bonnee Quin, MD  Resident  07/26/18 [phone]

## 2018-07-23 NOTE — Unmapped (Signed)
Problem: Acute Pain  Patient's pain progressing toward patient's stated pain goal  Goal: Patient will manage pain with the appropriate technique/intervention  Assess and monitor patient's pain using appropriate pain scale. Collaborate with interdisciplinary team and initiate plan and interventions as ordered.  Re-assess patient's pain level 30-60 minutes after pain management intervention.  Outcome: Progressing  Patient assessed for pain. Pain rating 8/10. VS stable. PRN pain meds administered. Will continue to monitor.

## 2018-07-23 NOTE — Nursing Note (Addendum)
Patient arrived to unit via transport in wheelchair with IV Cephepime running. Patient complaining of pain as soon as this RN walked in. Patient assessed. VSS. 7N med team contacted to come see patient. Will continue to monitor.

## 2018-07-23 NOTE — Consults (Signed)
ORTHOPEDIC CONSULT H&P    ASSESSMENT: Anne Goodwin is a 37 y.o. female with a PMHx of IVDU, DM who presents as a findings of myositis and possible L SI joint infection.    PLAN:    -No acute operative intervention  -WBS: WBAT BLE  -Diet: NPO at midnight  -PT/OT  -pain control  -Dispo: Per primary  -plan was discussed with senior resident, Dr. Jacquenette Shone  -patient was staffed with my attending, Dr. Simmie Davies, MD  Orthopedic Surgery Resident  Ortho Service Pager: [phone]  07/23/2018 1:42 PM  ___________________________________________________________________    Requesting Service: ED  Ortho Attending: Conley Rolls       Time Ortho Paged: 1200  Time of Ortho Evaluation: 1240    HPI:  Date of Injury: unknown   NPO since 0500      Anne Goodwin is an 37 y.o. female PMHx of IVDU, DM who presents as a findings of myositis and possible L SI joint infection. Patient previously was being treated at OSH for same infection, but left AMA    Patient seen and examined in ED. Endorses pain in her side. Denies numbness/tingling in extremities, or other constitutional symptoms     Work history /ambulation status:     Past Med/Surg/Family History  History reviewed. No pertinent past medical history.  History reviewed. No pertinent surgical history.  Past Medical History:   Diagnosis Date    Abnormal Pap smear     Anxiety     Back pain     Bipolar disorder (CMS Dx)     Depression     Diabetes mellitus (CMS Dx)     Ectopic pregnancy     Hashimoto's thyroiditis     OCD (obsessive compulsive disorder)     Pituitary adenoma (CMS Dx)       Past Surgical History:   Procedure Laterality Date    CERVICAL FUSION      CESAREAN SECTION       Family History   Problem Relation Age of Onset    Cancer Maternal Grandfather     Cancer Paternal Grandfather     Hyperlipidemia Paternal Grandmother     Depression Mother     Depression Sister     Depression Brother        Past Social History  Social History   Substance  Use Topics    Smoking status: Current Every Day Smoker     Packs/day: 0.50     Types: Cigarettes    Smokeless tobacco: Never Used    Alcohol use No     Allergies   Allergen Reactions    Flagyl [Metronidazole]     Keflex [Cephalexin]     Latex, Natural Rubber        Medications:    (Not in a hospital admission)  No current facility-administered medications for this encounter.      Current Outpatient Prescriptions   Medication Sig Dispense Refill    acetaminophen (TYLENOL) 650 MG suppository Place 650 mg rectally every 4 hours as needed.      citalopram (CELEXA) 10 MG tablet Take 10 mg by mouth daily.      cyclobenzaprine (FLEXERIL) 10 MG tablet Take 10 mg by mouth 3 times a day as needed for Muscle spasms.      docusate sodium (COLACE) 100 MG capsule Take 100 mg by mouth 2 times a day.      folic acid (FOLVITE) 1 MG tablet Take 1 mg by mouth  daily.      gabapentin (NEURONTIN) 300 MG capsule   1    glyBURIDE (DIABETA) 5 MG tablet Take 5 mg by mouth daily with breakfast.      HYDROcodone-acetaminophen (NORCO) 5-325 mg per tablet   0    hydrOXYzine pamoate (VISTARIL) 50 MG capsule Take 1 capsule (50 mg total) by mouth every 3 hours as needed for Itching. 30 capsule 0    meloxicam (MOBIC) 15 MG tablet   1    metFORMIN (GLUCOPHAGE) 1000 MG tablet   3    naloxone (NARCAN) 4 mg/actuation Spry Insert into either nostril then completely depress plunger to give dose of naloxone.  Use as needed for opiate/narcotic overdose that leads to decreased breathing or no breathing 2 each 0    ondansetron (ZOFRAN-ODT) 4 MG disintegrating tablet Take 4 mg by mouth every 8 hours as needed for Nausea.      oxyCODONE-acetaminophen (PERCOCET) 5-325 mg per tablet   0    PNV with calcium no.72-iron-FA (PRENATAL PLUS WITH IRON, CA,) 27 mg iron- 1 mg Tab tablet Take 1 tablet by mouth daily.      traZODone (DESYREL) 50 MG Take 25 mg by mouth at bedtime.         ROS:  12-point ROS performed. Negative except as stated in HPI  above.     Physical Exam:  Patient Vitals for the past 24 hrs:   BP Temp Temp src Pulse Resp SpO2 Weight   07/23/18 1246 133/78 98.1 F (36.7 C) - 71 16 98 % -   07/23/18 0924 124/83 97.6 F (36.4 C) Oral 70 18 99 % -   07/23/18 0923 - - - - - - 134 lb (60.8 kg)     Pain Score: Pain Score:   7  General: NAD  HEENT: atraumatic  CV/P: unlabored breathing  Musculoskeletal:    Pelvis:   -Stable, non-tender to AP and lateral compression    RUE:   No point tenderness or gross deformity   Vascular: radial pulse 2+, cap refill <2 sec  Strength: (+) deltoid, biceps, triceps, wrist flexors, wrist extensors, intrinsics, grip   (+) AIN/PIN/IO  Sensation: (+) radial, median, ulnar, axillary nerves    LUE:  No point tenderness or gross deformity  Vascular: radial pulse 2+, cap refill <2 sec  Strength: (+) deltoid, biceps, triceps, wrist flexors, wrist extensors, intrinsics, grip   (+) AIN/PIN/IO  Sensation: (+) radial, median, ulnar, axillary nerves    RLE:  No point tenderness or gross deformity, no pain on log roll  Vascular: DP and PT pulses 2+  Strength: (+) quadriceps, hamstrings, gastroc/soleus, anterior tibialis, EHL, FHL  Sensation: (+) sural, saphenous, tibial, deep peroneal, superficial peroneal nerves    LLE:  Point tenderness to posterior pelvis  Vascular: DP and PT pulses 2+  Strength: (+) quadriceps, hamstrings, gastroc/soleus, anterior tibialis, EHL, FHL  Sensation: (+) sural, saphenous, tibial, deep peroneal, superficial peroneal nerves    Labs:   Lab Results   Component Value Date    WBC 6.1 07/23/2018    HGB 10.5 (L) 07/23/2018    HCT 31.2 (L) 07/23/2018    MCV 82.3 07/23/2018    PLT 252 07/23/2018     Lab Results   Component Value Date    GLUCOSE 87 07/23/2018    BUN 13 07/23/2018    CO2 28 07/23/2018    CREATININE 0.52 (L) 07/23/2018    K 5.2 07/23/2018    NA 138 07/23/2018  CL 104 07/23/2018    CALCIUM 8.8 07/23/2018     No results found for: PTT, INR  No results found for: SEDRATE  Lab Results    Component Value Date    CRP 86.3 (H) 07/23/2018       Imaging:   No results found.    Diagnoses:  Possible SI infection

## 2018-07-24 ENCOUNTER — Inpatient Hospital Stay: Admit: 2018-07-24 | Payer: PRIVATE HEALTH INSURANCE | Primary: Family

## 2018-07-24 LAB — PROTIME-INR
INR: 1.1 (ref 0.9–1.1)
Protime: 14.7 seconds (ref 12.1–15.1)

## 2018-07-24 LAB — CBC
Hematocrit: 33.2 % (ref 35.0–45.0)
Hemoglobin: 11.1 g/dL (ref 11.7–15.5)
MCH: 27.3 pg (ref 27.0–33.0)
MCHC: 33.5 g/dL (ref 32.0–36.0)
MCV: 81.4 fL (ref 80.0–100.0)
MPV: 7.9 fL (ref 7.5–11.5)
Platelets: 251 10*3/uL (ref 140–400)
RBC: 4.07 10*6/uL (ref 3.80–5.10)
RDW: 14.6 % (ref 11.0–15.0)
WBC: 6.7 10*3/uL (ref 3.8–10.8)

## 2018-07-24 LAB — DIFFERENTIAL
Basophils Absolute: 27 /uL (ref 0–200)
Basophils Relative: 0.4 % (ref 0.0–1.0)
Eosinophils Absolute: 47 /uL (ref 15–500)
Eosinophils Relative: 0.7 % (ref 0.0–8.0)
Lymphocytes Absolute: 1575 /uL (ref 850–3900)
Lymphocytes Relative: 23.5 % (ref 15.0–45.0)
Monocytes Absolute: 442 /uL (ref 200–950)
Monocytes Relative: 6.6 % (ref 0.0–12.0)
Neutrophils Absolute: 4610 /uL (ref 1500–7800)
Neutrophils Relative: 68.8 % (ref 40.0–80.0)
nRBC: 0 /100 WBC (ref 0–0)

## 2018-07-24 LAB — RENAL FUNCTION PANEL W/EGFR
Albumin: 3.6 g/dL (ref 3.5–5.7)
Anion Gap: 10 mmol/L (ref 3–16)
BUN: 12 mg/dL (ref 7–25)
CO2: 26 mmol/L (ref 21–33)
Calcium: 9.6 mg/dL (ref 8.6–10.3)
Chloride: 102 mmol/L (ref 98–110)
Creatinine: 0.52 mg/dL (ref 0.60–1.30)
Glucose: 111 mg/dL (ref 70–100)
Osmolality, Calculated: 286 mOsm/kg (ref 278–305)
Phosphorus: 4.2 mg/dL (ref 2.1–4.7)
Potassium: 3.5 mmol/L (ref 3.5–5.3)
Sodium: 138 mmol/L (ref 133–146)
eGFR AA CKD-EPI: >90 See note.
eGFR NONAA CKD-EPI: >90 See note.

## 2018-07-24 LAB — HEPATIC FUNCTION PANEL
ALT: 10 U/L (ref 7–52)
AST: 20 U/L (ref 13–39)
Albumin: 3.6 g/dL (ref 3.5–5.7)
Alkaline Phosphatase: 63 U/L (ref 36–125)
Bilirubin, Direct: 0.09 mg/dL (ref 0.00–0.40)
Bilirubin, Indirect: 0.31 mg/dL (ref 0.00–1.10)
Total Bilirubin: 0.4 mg/dL (ref 0.0–1.5)
Total Protein: 7.6 g/dL (ref 6.4–8.9)

## 2018-07-24 LAB — HEMOGLOBIN A1C: Hemoglobin A1C: 6.6 % (ref 4.0–5.6)

## 2018-07-24 LAB — VANCOMYCIN, TROUGH: Vancomycin Tr: 10.2 ug/mL (ref 10.0–20.0)

## 2018-07-24 LAB — POC GLU MONITORING DEVICE
POC Glucose Monitoring Device: 112 mg/dL — ABNORMAL HIGH (ref 70–100)
POC Glucose Monitoring Device: 110 mg/dL (ref 70–100)
POC Glucose Monitoring Device: 91 mg/dL (ref 70–100)
POC Glucose Monitoring Device: 103 mg/dL (ref 70–100)

## 2018-07-24 LAB — HEPATITIS C RNA, QUALITATIVE PCR: Hepatitis C RNA-PCR: DETECTED — AB

## 2018-07-24 LAB — HEPATITIS B VIRUS (HBV), REAL-TIME PCR, QUANT: Hep B Viral DNA IU/ML: NOT DETECTED [IU]/mL

## 2018-07-24 MED ORDER — buprenorphine HCl (SUBUTEX) SL tablet 4 mg
2 | Freq: Once | SUBLINGUAL | Status: AC | PRN
Start: 2018-07-24 — End: 2018-07-24
  Administered 2018-07-24: 13:00:00 4 mg via SUBLINGUAL

## 2018-07-24 MED ORDER — methadone (DOLOPHINE) 5 mg/5 mL oral solution 25 mg
5 | Freq: Every day | ORAL | Status: AC
Start: 2018-07-24 — End: 2018-07-29
  Administered 2018-07-25 – 2018-07-29 (×5): 25 mg via ORAL

## 2018-07-24 MED ORDER — oxyCODONE (ROXICODONE) immediate release tablet 10 mg
5 | Freq: Four times a day (QID) | ORAL | Status: AC | PRN
Start: 2018-07-24 — End: 2018-07-25
  Administered 2018-07-24: 21:00:00 5 mg via ORAL
  Administered 2018-07-25 (×2): 10 mg via ORAL

## 2018-07-24 MED ORDER — polyethylene glycol (MIRALAX) packet 17 g
17 | Freq: Two times a day (BID) | ORAL | Status: AC
Start: 2018-07-24 — End: 2018-07-30
  Administered 2018-07-29 – 2018-07-30 (×2): 17 g via ORAL

## 2018-07-24 MED ORDER — oxyCODONE (ROXICODONE) immediate release tablet 5 mg
5 | Freq: Four times a day (QID) | ORAL | Status: AC | PRN
Start: 2018-07-24 — End: 2018-07-25
  Administered 2018-07-24 (×2): 5 mg via ORAL

## 2018-07-24 MED ORDER — gadobutrol (GADAVIST) 1 mmol/1 mL IV syringe 6 mL
1 | Freq: Once | INTRAVENOUS | Status: AC | PRN
Start: 2018-07-24 — End: 2018-07-24
  Administered 2018-07-24: 22:00:00 6 mL/kg via INTRAVENOUS

## 2018-07-24 MED ORDER — methadone (DOLOPHINE) 5 mg/5 mL oral solution 25 mg
5 | Freq: Every day | ORAL | Status: AC
Start: 2018-07-24 — End: 2018-07-24

## 2018-07-24 MED ORDER — LORazepam (ATIVAN) injection 0.5 mg
2 | Freq: Once | INTRAMUSCULAR | Status: AC | PRN
Start: 2018-07-24 — End: 2018-07-24
  Administered 2018-07-24: 21:00:00 0.5 mg via INTRAVENOUS

## 2018-07-24 MED ORDER — buprenorphine HCl (SUBUTEX) SL tablet 4 mg
2 | Freq: Once | SUBLINGUAL | Status: AC | PRN
Start: 2018-07-24 — End: 2018-07-24

## 2018-07-24 MED ORDER — oxyCODONE (ROXICODONE) immediate release tablet 5 mg
5 | Freq: Once | ORAL | Status: AC
Start: 2018-07-24 — End: 2018-07-24
  Administered 2018-07-24: 13:00:00 5 mg via ORAL

## 2018-07-24 MED ORDER — LORazepam (ATIVAN) tablet 0.5 mg
0.5 | Freq: Once | ORAL | Status: AC | PRN
Start: 2018-07-24 — End: 2018-07-24

## 2018-07-24 MED ORDER — senna-docusate (SENNA-S) 8.6-50 mg per tablet 2 tablet
8.6-50 | Freq: Two times a day (BID) | ORAL | Status: AC
Start: 2018-07-24 — End: 2018-08-14
  Administered 2018-07-24 – 2018-08-14 (×38): 2 via ORAL

## 2018-07-24 MED FILL — IBUPROFEN 200 MG TABLET: 200 200 MG | ORAL | Qty: 2

## 2018-07-24 MED FILL — METHADONE 5 MG/5 ML ORAL SOLUTION: 5 5 mg/5 mL | ORAL | Qty: 25

## 2018-07-24 MED FILL — HEPARIN (PORCINE) 5,000 UNIT/ML INJECTION SOLUTION: 5000 5,000 unit/mL | INTRAMUSCULAR | Qty: 1

## 2018-07-24 MED FILL — TYLENOL 325 MG TABLET: 325 325 mg | ORAL | Qty: 3

## 2018-07-24 MED FILL — BUPRENORPHINE HCL 2 MG SUBLINGUAL TABLET: 2 2 mg | SUBLINGUAL | Qty: 2

## 2018-07-24 MED FILL — OXYCODONE 5 MG TABLET: 5 5 MG | ORAL | Qty: 2

## 2018-07-24 MED FILL — LIDODERM 5 % TOPICAL PATCH: 5 5 % | TOPICAL | Qty: 1

## 2018-07-24 MED FILL — VANCOMYCIN 1,000 MG INTRAVENOUS INJECTION: 1000 1000 mg | INTRAVENOUS | Qty: 1000

## 2018-07-24 MED FILL — OXYCODONE 5 MG TABLET: 5 5 MG | ORAL | Qty: 1

## 2018-07-24 MED FILL — SODIUM CHLORIDE 0.9 % INTRAVENOUS PIGGYBACK: 2.00 2.00 g | INTRAVENOUS | Qty: 100

## 2018-07-24 MED FILL — CLONIDINE HCL 0.1 MG TABLET: 0.1 0.1 MG | ORAL | Qty: 1

## 2018-07-24 MED FILL — SENNOSIDES 8.6 MG-DOCUSATE SODIUM 50 MG TABLET: 8.6-50 8.6-50 mg | ORAL | Qty: 2

## 2018-07-24 MED FILL — SODIUM CHLORIDE 0.9 % INTRAVENOUS PIGGYBACK: 15.00 15.00 mg/kg | INTRAVENOUS | Qty: 250

## 2018-07-24 MED FILL — CEFEPIME 2 GRAM SOLUTION FOR INJECTION: 2 2 gram | INTRAMUSCULAR | Qty: 1

## 2018-07-24 MED FILL — GADAVIST 7.5 MMOL/7.5 ML (1 MMOL/ML) INTRAVENOUS SYRINGE: 7.5 7.5 mmol/7.5 mL (1 mmol/mL) | INTRAVENOUS | Qty: 7.5

## 2018-07-24 MED FILL — POLYETHYLENE GLYCOL 3350 17 GRAM ORAL POWDER PACKET: 17 17 gram | ORAL | Qty: 1

## 2018-07-24 MED FILL — LORAZEPAM 2 MG/ML INJECTION SOLUTION: 2 2 mg/mL | INTRAMUSCULAR | Qty: 1

## 2018-07-24 NOTE — Nursing Note (Signed)
Team was notified about micro results from a peripheral draw gram neg in rods.

## 2018-07-24 NOTE — Unmapped (Signed)
Department of Internal Medicine  Daily Progress Note      Chief Complaint / Reason for Follow-Up     Naome Brigandi is a 37 y.o. female on hospital day 1. 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    Interval History / Subjective     Continues to complain of hip/back pain this morning. Received IV hydromorphone overnight.    Underwent MRI of L spine yesterday evening, but she quit exam due to claustrophobia.   Spoke to neuroradiology resident, he felt that this very-limited MRI was sufficient to eliminate concern for lumbar spine involvement, but agreed that pelvic MRI would be good.      Review of Systems (Focused)     Denies fevers, chills, chest pain, shortness of breath, abdominal pain, n/v/c/d.  Endorses symptoms of opioid withdrawal (restless legs, anxiety, etc)      Medications     Scheduled Meds:  ??? acetaminophen  975 mg Oral 3 times per day   ??? ceFEPime (MAXIPIME) IV extended infusion  2 g Intravenous Q12H   ??? heparin  5,000 Units Subcutaneous 3 times per day   ??? insulin lispro  0-5 Units Subcutaneous TID AC   ??? bolus IV fluid   Intravenous Once   ??? lidocaine  1 patch Transdermal Q24H   ??? polyethylene glycol  17 g Oral BID   ??? senna-docusate  2 tablet Oral BID   ??? vancomycin  15 mg/kg Intravenous Q8H     Continuous Infusions:  PRN Meds:  buprenorphine HCl, cloNIDine HCl, dextrose 10% in water **OR** dextrose 10% in water, glucose, ibuprofen, loperamide, naloxone, ondansetron, oxyCODONE **OR** oxyCODONE       Vital Signs     Temp:  [98 ??F (36.7 ??C)-98.9 ??F (37.2 ??C)] 98 ??F (36.7 ??C)  Heart Rate:  [69-90] 69  Resp:  [15-18] 16  BP: (125-147)/(70-88) 147/77    Intake/Output Summary (Last 24 hours) at 07/24/18 1335  Last data filed at 07/23/18 1549   Gross per 24 hour   Intake              100 ml   Output                0 ml   Net              100 ml         Physical Exam     Constitutional: She is oriented to person, place, and time. She appears well-developed and  well-nourished. Visibly anxious and agitated.   Eyes: Conjunctivae and EOM are normal. No scleral icterus.   Neck: Trachea midline, No JVD present.   Cardiovascular: Normal rate, regular rhythm, normal heart sounds and intact distal pulses.    No murmur heard.  Pulmonary/Chest: Effort normal and breath sounds normal. No respiratory distress. She has no wheezes. She has no rales.   Musculoskeletal: no edema. Exam of gluteal muscles was performed by attending, revealed tenderness to palpation and tenderness to extension of lower extremities at hip.  Neurological: She is alert and oriented to person, place, and time.   Psychiatric: anxious mood, tearful affect.  ??    Laboratory Data         Lab 07/24/18  0437 07/23/18  0959   WBC 6.7 6.1   HEMOGLOBIN 11.1* 10.5*   HEMATOCRIT 33.2* 31.2*   MEAN CORPUSCULAR VOLUME 81.4 82.3   PLATELETS 251 252  Lab 07/24/18  0437 07/23/18  0959   SODIUM 138 138   POTASSIUM 3.5 5.2   CHLORIDE 102 104   CO2 26 28   BUN 12 13   CREATININE 0.52* 0.52*   GLUCOSE 111* 87           Lab 07/24/18  0437 07/23/18  0959   CALCIUM 9.6 8.8   PHOSPHORUS 4.2  --            Lab 07/24/18  0437   INR 1.1   PROTHROMBIN TIME 14.7           Lab 07/24/18  0437   ALT 10   AST 20   ALK PHOS 63   BILIRUBIN TOTAL 0.4   BILIRUBIN DIRECT 0.09   ALBUMIN 3.6   3.6             Invalid input(s): WBCCAST, GRANCAST        Lab 07/23/18  0959   CK TOTAL 249*       No results found for: NTPROBNP    Lab Results   Component Value Date    TSH 1.27 05/09/2007           Lab 07/24/18  1006 07/24/18  0352   POC GLU MONITORING DEVICE 110* 112*           Diagnostic Studies     X-ray Pelvis 1 or 2-views   Final Result   IMPRESSION:   No acute abnormality.      Report Verified by: Zane Herald, MD at 07/23/2018 10:15 PM EDT      MRI Lumbar spine limited WO   Final Result   IMPRESSION:      Limited study with no suggestion of discitis osteomyelitis in the lumbar spine.      Report Verified by: Cleatis Polka, MD at 07/23/2018 8:40 PM  EDT      MRI Pelvis W and WO contrast    (Results Pending)           Assessment & Plan     Alesa Echevarria is a 37 y.o. female on HD# 1 with Infective myositis.  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 Patient was admitted from 07/13/18 to 07/18/18 and was getting Vanc and cefepime. She had an TEE on 07/18/18 that did not show any signs of infective endocarditis. She left AMA after her TEE. She was given Linezolid to fill when she left AMA, but she never got it. She presents complaining of increased pain in her left hip worsened with moving her leg and trouble with walking secondary to her pain. She had MRI L spine on 07/13/18 at OSH that did not show any spine involvement of her infection. Ortho evaluated her and they do not anticipate any immediate surgical intervention, but they will follow her.  - cont IV vanc and cefipime  - 9/22 MRI L spine limited by pt leaving scanner, but spoke to neuroradiology resident (9/23), they feel that this study was sufficient to  eliminate concern for discitis.   - 9/22 1 of 2 blood cultures grew gram negative bacilli.   - 9/23 will obtain pelvic mri to eval hip joint  ??  #T2DM  Patient has a history of type 2 diabetes, but she does not take any medications at home. Her A1c on admission is 6.6  - low dose sliding scale insulin regimen    #IVDU  Patient uses Heroin and fentanyl and her last use was yesterday. HIV testing was negative at OSH.  -COWS protocol for withdrawal started.  ??  #Hepatitis Viruses Labs  Her Hep A igM, Hep B core IgM,  and Hep C ab were all reactive on 07/14/18 at OSH. Hepatic labs wnl on 9/23.  -Hep B DNA quant and Hep C DNA quant pending.        Nutrition:  Diet Orders          Diet consistent carbohydrates 1900-2100 kcal starting at 09/23 1320          Code Status: Full Code    Signed:  Rohitha Moudgal  07/24/2018, 1:35 PM

## 2018-07-24 NOTE — Unmapped (Signed)
Per rounds with team patient will not be discharged today.     Hal Hope BSN, Brewing technologist  [phone]
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
## 2018-07-24 NOTE — Unmapped (Signed)
[facility]: Vancomycin Monitoring Consult       Anne Goodwin is a 37 y.o. female currently being treated for myositis.    Patient is allergic to latex, natural rubber.    Pharmacy consulted for vancomycin management by 7N medicine team.      Current Anti-Infectives       Dose Frequency Start End    cefepime (MAXIPIME) 2 g in sodium chloride 0.9% 100 mL IVPB ADDaptor 2 g Every 12 hours 07/23/2018     Sig: Intravenous 2 g every 12 hours.          Route: Intravenous    vancomycin (VANCOCIN) 1,000 mg in sodium chloride 0.9% 250 mL ADDaptor IVPB 15 mg/kg ?? 60.8 kg Every 8 hours 07/23/2018     Sig: Intravenous 1,000 mg every 8 hours.          Route: Intravenous    vancomycin (VANCOCIN) 1,000 mg in sodium chloride 0.9% 250 mL ADDaptor IVPB (Discontinued) 15 mg/kg ?? 60.8 kg Every 12 hours 07/23/2018 07/23/2018    Sig: Intravenous 1,000 mg every 12 hours.          Route: Intravenous    vancomycin (VANCOCIN) 1,000 mg in sodium chloride 0.9% 250 mL ADDaptor IVPB (Discontinued) 15 mg/kg ?? 60.8 kg Every 12 hours 07/23/2018 07/23/2018    Sig: Intravenous 1,000 mg every 12 hours.          Route: Intravenous        documented within (last 72 hours)     Date/Time Action Medication Dose Rate    07/24/18 1046 New Bag    vancomycin (VANCOCIN) 1,000 mg in sodium chloride 0.9% 250 mL ADDaptor IVPB 1,000 mg 250 mL/hr    07/24/18 0200 New Bag    vancomycin (VANCOCIN) 1,000 mg in sodium chloride 0.9% 250 mL ADDaptor IVPB 1,000 mg 250 mL/hr    07/23/18 2029 New Bag    vancomycin (VANCOCIN) 1,000 mg in sodium chloride 0.9% 250 mL ADDaptor IVPB 1,000 mg 250 mL/hr    07/23/18 1540 Not Given   [Order was discontinued]    vancomycin (VANCOCIN) 1,000 mg in sodium chloride 0.9% 250 mL ADDaptor IVPB 1,000 mg 250 mL/hr    07/23/18 1003 Given    vancomycin (VANCOCIN) 1,500 mg in sodium chloride 0.9 % 250 mL IVPB 1,500 mg 167 mL/hr          --Objective Data--     Vitals:    07/23/18 2126 07/23/18 2301 07/24/18 0354  07/24/18 0914   BP: 141/87 136/76 125/73 136/88   Pulse: 86 80 71 71   Resp: 18 18 18 18    Temp: 98.3 ??F (36.8 ??C) 98.4 ??F (36.9 ??C) 98.9 ??F (37.2 ??C) 98.1 ??F (36.7 ??C)   TempSrc: Oral Oral Oral Oral   SpO2: 99% 97% 97% 100%   Weight:       Height:           I/O last 3 completed shifts:  In: 100 [P.O.:100]  Out: -     WBC, BUN, Creatinine     WBC   BUN   Creatinine      6.7 07/24/18 0437 12 07/24/18 0437 0.52 (!) 07/24/18 0437    6.1 07/23/18 0959 13 07/23/18 0959 0.52 (!) 07/23/18 0959          Ideal body weight: 57 kg (125 lb 10.6 oz)  Adjusted ideal body weight: 58.5 kg (129 lb)    --Cultures--     Microbiology Results  Date and Time Order Name Sensitivity Status Organisms Specimen ID Source    07/23/2018 1118 Blood culture-Peripheral #1  Preliminary  [id number] Peripheral    07/23/2018 0959 BC Rapid Molecular ID - Gram Negative Assay  Final  [id number] Peripheral    07/23/2018 0959 Blood culture-Peripheral #2  Preliminary  [id number] Peripheral          --Vancomycin Concentrations--     Lab Results     Vanc Tr      10.2 07/24/18 1050             --Assessment and Plan--        Patient is a 37 y.o. female being treated with vancomycin for myositis.    ?? Vancomycin trough concentration of 10.2 mg/L, was drawn appropriately.  ?? Continue current vancomycin dose of 1000 mg IV every 8 hours.  ?? Check vancomycin serum trough concentration prior to 4th dose of current regimen.   ?? Goal serum trough concentration is 10-20 mg/L.  ?? Check renal panel daily until stable regimen is determined, then as clinically indicated. Avoid nephrotoxins as able.   ?? Pharmacy will continue to follow for the monitoring of therapy efficacy and toxicity. Please call if you have any questions.       Thank you for the consult.    Rushie Chestnut, PharmD  Clinical Pharmacy Specialist- Internal Medicine  Pager: [phone]  On-Call/ Weekend Pager: [phone]  07/24/18  12:50 PM

## 2018-07-24 NOTE — Unmapped (Signed)
Nursing Overnight Progress Note    Significant Events During Shift  None     Patient/Family Concerns  Evening/Overnight Visitors: no   Concerns: none     Assessment  Nursing time demands: moderate   IV access: yes  Sitter requirements:  No     Mental Status  Mental Status for the past 14 hrs:   Level of Consciousness Orientation Level Cognition   07/23/18 2119 Alert Oriented X4 Follows simple commands       Calls to Physician Team  Calls for the past 14 hrs:   Reason for Communication Notification Details Provider Name Provider Role Method of Communication Response Time Attempted Contact With Provider Time Response Received From Provider   07/23/18 1650 Other (Comment) Provider Aware Felecia Jan, MD Resident Call En route [phone]       Medications  [phone] - Medications Not Given  (last 12 hrs)         ** SITE UNKNOWN **       Medication Name Action Time Action Reason Comments     heparin (porcine) injection 5,000 Units 07/23/18 2028 Not Given Patient/family refused      heparin (porcine) injection 5,000 Units 07/24/18 0401 Not Given Patient/family refused      insulin lispro (humaLOG) injection 0-5 Units 07/23/18 2119 Not Given Order parameters not met pt does not want to eat                Diet/Meals Consumed (past 24 hours)  Nutrition Assessment for the past 24 hrs:   Diet Type Feeding   07/23/18 1549 Regular Able to feed self

## 2018-07-24 NOTE — Unmapped (Signed)
ORTHOPAEDIC SURGERY PROGRESS NOTE    ID:  Anne Goodwin is a 37 y.o. female with infected myositis and possible left SI joint infection.  IVDU.  ADMIT DATE:  07/23/2018    S:  Still complaining of pain this am.  Mostly in left buttock region.  States she will not be able to lay still for an MRI.    O:  Vitals:    07/23/18 2126 07/23/18 2301 07/24/18 0354 07/24/18 0914   BP: 141/87 136/76 125/73 136/88   BP Location: Right arm Right arm Right arm Right arm   Patient Position: Lying Lying Lying Lying   Pulse: 86 80 71 71   Resp: 18 18 18 18    Temp: 98.3 ??F (36.8 ??C) 98.4 ??F (36.9 ??C) 98.9 ??F (37.2 ??C) 98.1 ??F (36.7 ??C)   TempSrc: Oral Oral Oral Oral   SpO2: 99% 97% 97% 100%   Weight:       Height:           General:  NAD; lying in bed  CV/Pulmonary:  Breathing unlabored    MSK:  Left LE  No pain with log roll  TTP over left buttock region  Pain with FADIR  No significant erythema or warmth noted  SILT sp/dp/t/sural/saphenous  EHL/TA/GS intact  Toes up and down  Cap refill < 2 sec    Labs:  Lab Results   Component Value Date    WBC 6.7 07/24/2018    HGB 11.1 (L) 07/24/2018    HCT 33.2 (L) 07/24/2018    MCV 81.4 07/24/2018    PLT 251 07/24/2018     Lab Results   Component Value Date    CREATININE 0.52 (L) 07/24/2018    BUN 12 07/24/2018    NA 138 07/24/2018    K 3.5 07/24/2018    CL 102 07/24/2018    CO2 26 07/24/2018     Lab Results   Component Value Date    INR 1.1 07/24/2018         A/P:  Anne Goodwin is a 37 y.o. female IVDU with infected myositis and possible left SI joint infection.    -IV Abx:  Vanc/cefepime per primary  -WBS: WBAT BLE  -Would recommend obtaining a pelvis MRI to evaluate for SI joint osteomyelitis  -PT/OT: eval and treat when able  -Pain control  -OR plans: none at this time; may recommend IR aspirate pending MRI results  -Anticoagulation: per primary  -Dispo planning: pending MRI and further plans    Georgiann Hahn, MD  Orthopedic Surgery  07/24/2018 11:13 AM

## 2018-07-24 NOTE — Unmapped (Signed)
See medical student note by Rhea Bleacher, UCIV which I have cosigned.

## 2018-07-24 NOTE — Plan of Care (Signed)
Problem: Fall Prevention  Goal: Patient will remain free of falls  Assess and monitor vitals signs, neurological status including level of consciousness and orientation.  Reassess fall risk per hospital policy.    Ensure arm band on, uncluttered walking paths in room, adequate room lighting, call light and overbed table within reach, bed in low position, wheels locked, side rails up per policy (excluding SNF), and non-skid footwear provided.    Outcome: Progressing

## 2018-07-24 NOTE — Med Student Consult (Signed)
[facility]  DEPARTMENT OF INFECTIOUS DISEASE  CONSULT  NOTE    Referring Physician: Nemiah Commander, MD  Consult Attending: Belva Chimes, MD  Patient: Anne Goodwin  MRN: [id number]  CSN: [id number]    Reason for Consult:     Left hip osteomyelitis; Hepatitis A,B,C    History of Present Illness     Anne Goodwin is a 37 y.o. female with a past med hx of IVDU, T2DM on hospital day 1 for left hip pain.     Patient originally admitted to [facility] on 05/12/18 due to MRI pelvis with myositis in left piriformis and gluteus maximus, left SI joint effusion. Admitted and given vancomycin and cefepime. Blood cx on 9/12 were negative. Found to be Hep A, B, C IgM positive with negative HIV on 07/14/18. Received TEE on 07/18/18 which was negative endocarditis and patient signed out AMA. Given PO linezolid which patient did not fill per chart Rx list.    Presented to [facility] on 9/22 due to increasing left hip pain. 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 She reports 1 week of increasing hip pain, subjective fevers/chills, mild mid back pain and constipation. Upon questioning, she reports mild tenderness in bilat shoulders. No n/v, dysuria/hematuria.    Past Medical History     Past Medical History:   Diagnosis Date    Abnormal Pap smear     Anxiety     Back pain     Bipolar disorder (CMS Dx)     Depression     Diabetes mellitus (CMS Dx)     Ectopic pregnancy     Hashimoto's thyroiditis     OCD (obsessive compulsive disorder)     Pituitary adenoma (CMS Dx)        Past Surgical History     Past Surgical History:   Procedure Laterality Date    CERVICAL FUSION      CESAREAN SECTION         Family History     Family History   Problem Relation Age of Onset    Cancer Maternal Grandfather     Cancer Paternal Grandfather     Hyperlipidemia Paternal Grandmother     Depression Mother     Depression Sister     Depression Brother        Social History     Social History     Social History    Marital status: Divorced      Spouse name: N/A    Number of children: N/A    Years of education: N/A     Occupational History    Not on file.     Social History Main Topics    Smoking status: Current Every Day Smoker     Packs/day: 0.50     Types: Cigarettes    Smokeless tobacco: Never Used    Alcohol use No    Drug use: Yes     Frequency: 1.0 time per week     Types: Heroin    Sexual activity: No     Other Topics Concern    Not on file     Social History Narrative    No narrative on file         Medications     Allergies:  Allergies   Allergen Reactions    Latex, Natural Rubber        Home Meds:  There are no discharge medications for this patient.      Inpatient Meds:  Scheduled:  acetaminophen  975 mg Oral 3 times per day    ceFEPime (MAXIPIME) IV extended infusion  2 g Intravenous Q12H    heparin  5,000 Units Subcutaneous 3 times per day    insulin lispro  0-5 Units Subcutaneous TID AC    bolus IV fluid   Intravenous Once    lidocaine  1 patch Transdermal Q24H    polyethylene glycol  17 g Oral BID    senna-docusate  2 tablet Oral BID    vancomycin  15 mg/kg Intravenous Q8H     Infusions:    HYQ:MVHQIONGEXBMW HCl, cloNIDine HCl, dextrose 10% in water **OR** dextrose 10% in water, glucose, ibuprofen, loperamide, LORazepam, naloxone, ondansetron, oxyCODONE **OR** oxyCODONE    Review of Systems     Positive for fevers, chills, malaise, myalgias  Negative for cough, CP, SOB  Negative for n/v, positive for constipation  Negative for dysuria, hematuria  Positive for back pain, shoulder pain  Negative for rashes.    Vital Signs     Temp:  [98 F (36.7 C)-98.9 F (37.2 C)] 98 F (36.7 C)  Heart Rate:  [69-90] 69  Resp:  [15-18] 16  BP: (125-147)/(70-88) 147/77    Intake/Output Summary (Last 24 hours) at 07/24/18 1345  Last data filed at 07/23/18 1549   Gross per 24 hour   Intake              100 ml   Output                0 ml   Net              100 ml       Physical Exam     Gen: thin, WF, appears older than stated age,  visibly tired  HEENT: EOMI, soft supple neck with normal ROM  Cardio: nl S1/S2, no r/m/g, RRR  Lungs: CTAB, nl WOB, no w/c/s  Abd: mild tenderness to deep palpation in RUQ, mild hepatomegaly, no masses, BS+  Ext: no edema, no cyanosis, no nail changes, 2+  LAD: no cervical, supraclavicular or mandibular LAD.    LDA data  Peripheral IV 07/23/18 Right Forearm (Active)   Site Assessment No problems identified 07/23/2018  9:19 PM   Line Status Infusing 07/23/2018  9:19 PM   Dressing Status Clean;Intact;Dry 07/23/2018  9:19 PM   Number of days: 1       Laboratory Data     Lab Results   Component Value Date    HGB 11.1 (L) 07/24/2018    HCT 33.2 (L) 07/24/2018    WBC 6.7 07/24/2018    PLT 251 07/24/2018     Lab Results   Component Value Date    NEUTOPHILPCT 68.8 07/24/2018    LYMPHOPCT 23.5 07/24/2018    MONOPCT 6.6 07/24/2018    EOSPCT 0.7 07/24/2018    BASOPCT 0.4 07/24/2018    NEUTROABS 4,610 07/24/2018    LYMPHSABS 1,575 07/24/2018    MONOSABS 442 07/24/2018    EOSABS 47 07/24/2018    BASOSABS 27 07/24/2018     Lab Results   Component Value Date    NA 138 07/24/2018    K 3.5 07/24/2018    CL 102 07/24/2018    CO2 26 07/24/2018    GLUCOSE 111 (H) 07/24/2018    BUN 12 07/24/2018    CREATININE 0.52 (L) 07/24/2018    PHOS 4.2 07/24/2018     Lab Results   Component Value Date  ALKPHOS 63 07/24/2018    AST 20 07/24/2018    ALT 10 07/24/2018    ALBUMIN 3.6 07/24/2018    ALBUMIN 3.6 07/24/2018    BILIDIRECT 0.09 07/24/2018    BILITOT 0.4 07/24/2018    PROT 7.6 07/24/2018     Lab Results   Component Value Date    PROTIME 14.7 07/24/2018    INR 1.1 07/24/2018     Lab Results   Component Value Date    CHOLTOT 196 05/09/2007    TRIG 338 (H) 05/09/2007    HDL 40 05/09/2007    LDL 88 MG/DL (CALC) 16/08/9603     No results found for: VITD25H  No results found for: COCAINSCRNUR, OPIATESCRNUR, METHAMSCRNUR, THCSCRNUR, AMPHETSCRNUR, PHENSCRNUR, BENZOSCRNUR, BARBSCRNUR, METHADSCRNUR, OXYCODSCRN, MDMA, TCASCRNUR  No results found for:  ABSCD4HELP, CD4POSLY  No results found for: HIV1COP  No results found for: HAV  No results found for: HEPBSAG, HEPBCAB, HEPBEAB, HEPBDNA  Lab Results   Component Value Date    HCVAB Reactive (A) 07/23/2018     No results found for: TREPIA, RPRMONSCRN, RPRTITER, RPR  No results found for: CHLAMYDTRACH  No results found for: QUANTIFERTB     Lab Results   Component Value Date    ESR 75 (H) 07/23/2018     Lab Results   Component Value Date    CRP 86.3 (H) 07/23/2018             Invalid input(s): KEYTONESU    Microbiology Results     Date and Time Order Name Sensitivity Status Organisms Specimen ID Source    07/23/2018 1118 Blood culture-Peripheral #1  Preliminary  [id number] Peripheral    07/23/2018 0959 BC Rapid Molecular ID - Gram Negative Assay  Final  [id number] Peripheral    07/23/2018 0959 Blood culture-Peripheral #2  Preliminary  [id number] Peripheral          Culture Result   Date/Time Value Ref Range Status   07/23/2018 11:18 AM No Growth To Date  Incomplete       No results found for: AFBSMEAR, PPD    Outside blood cultures drawn before admission: Yes - Status:negative    Diagnostic Studies   X-ray Pelvis 1 Or 2-views    Result Date: 07/23/2018  EXAM: XR PELVIS 1 OR 2-VIEWS DATE:  07/23/2018 5:58 PM EDT CLINICAL HISTORY: pain, COMPARISON: 09/13/2008 FINDINGS: Evaluation is limited by obliquity. There is no fracture or dislocation. Soft tissues are normal. Joint spaces are maintained.     IMPRESSION: No acute abnormality. Report Verified by: Zane Herald, MD at 07/23/2018 10:15 PM EDT    Mri Lumbar Spine Limited Wo    Result Date: 07/23/2018  EXAM: MRI LUMBAR SPINE LIMITED WO CONTRAST DATE: 07/23/2018 7:28 PM EDT  INDICATION:  IVDU. MRI of the pelvis showing myositis involving the Lpiriformis and gluteus maximus muscles and effusion in the left SI joint that could be infectious in nature. COMPARISON: None. TECHNIQUE: MRI LUMBAR SPINE LIMITED WO CONTRAST obtained including multiplanar T1 and T2 weighted imaging. The patient  declined to continue with the examination. There is single T2 sequences obtained. FINDINGS: The alignment of the lumbar spine is normal. The conus terminates at L1-L2. The vertebral bodies are normal in height. There is no abnormal mass lesion, fluid collection or signal abnormality.     IMPRESSION: Limited study with no suggestion of discitis osteomyelitis in the lumbar spine. Report Verified by: Cleatis Polka, MD at 07/23/2018 8:40 PM EDT      Assessment & Plan  Anne Goodwin is a 36 y.o. female who is being seen for osteomyelitis, acute hepatitis.    Active Hospital Problems    Diagnosis Date Noted    Infective myositis [M60.009] 07/23/2018      Resolved Hospital Problems    Diagnosis Date Noted Date Resolved   No resolved problems to display.       Assessment:  In terms of left hip osteomyelitis, patient is at risk especially in the setting of positive gram negative cultures. Agree with current antibiotic treatment once pathogen and sensitives arise. Consider getting further thoracic, lumbar and pelvic imaging, per patient permitting.    In terms of hepatitis positivity. Patient currently in acute phase for Hepatitis A, B, C. Will await for Hep B/C quant levels. Will continue to monitor since patient might clear infection after acute phase. Can follow up OP.    Recommendations:  - Monitor gram negative bacilli speciation and sensitivities.  - Continue cefepime and vancomycin.  - Will follow up on Hepatitis B/C quant levels.    Thank you for the consult. Please page with any questions,  ID Consult pager: [phone]    Signed:  Marcelino Duster [facility]  07/24/2018, 1:45 PM

## 2018-07-24 NOTE — Unmapped (Signed)
Problem: Fall Prevention  Goal: Patient will remain free of falls  Assess and monitor vitals signs, neurological status including level of consciousness and orientation.  Reassess fall risk per hospital policy.    Ensure arm band on, uncluttered walking paths in room, adequate room lighting, call light and overbed table within reach, bed in low position, wheels locked, side rails up per policy (excluding SNF), and non-skid footwear provided.      Intervention: Keep call light within reach  Pt used call light for RN assistance when using the bathroom and transferred positions slowly. Pt remained free of falls.

## 2018-07-25 LAB — POC GLU MONITORING DEVICE
POC Glucose Monitoring Device: 246 mg/dL (ref 70–100)
POC Glucose Monitoring Device: 87 mg/dL (ref 70–100)
POC Glucose Monitoring Device: 69 mg/dL (ref 70–100)
POC Glucose Monitoring Device: 115 mg/dL (ref 70–100)

## 2018-07-25 LAB — CBC
Hematocrit: 30.7 % (ref 35.0–45.0)
Hemoglobin: 10.3 g/dL (ref 11.7–15.5)
MCH: 27.4 pg (ref 27.0–33.0)
MCHC: 33.6 g/dL (ref 32.0–36.0)
MCV: 81.5 fL (ref 80.0–100.0)
MPV: 7.8 fL (ref 7.5–11.5)
Platelets: 258 10*3/uL (ref 140–400)
RBC: 3.76 10*6/uL (ref 3.80–5.10)
RDW: 14.7 % (ref 11.0–15.0)
WBC: 5.5 10*3/uL (ref 3.8–10.8)

## 2018-07-25 LAB — RENAL FUNCTION PANEL W/EGFR
Albumin: 3.4 g/dL (ref 3.5–5.7)
Anion Gap: 9 mmol/L (ref 3–16)
BUN: 10 mg/dL (ref 7–25)
CO2: 27 mmol/L (ref 21–33)
Calcium: 9.2 mg/dL (ref 8.6–10.3)
Chloride: 106 mmol/L (ref 98–110)
Creatinine: 0.49 mg/dL (ref 0.60–1.30)
Glucose: 119 mg/dL (ref 70–100)
Osmolality, Calculated: 294 mOsm/kg (ref 278–305)
Phosphorus: 3.9 mg/dL (ref 2.1–4.7)
Potassium: 3.8 mmol/L (ref 3.5–5.3)
Sodium: 142 mmol/L (ref 133–146)
eGFR AA CKD-EPI: >90 See note.
eGFR NONAA CKD-EPI: >90 See note.

## 2018-07-25 LAB — BLOOD CULTURE-PERIPHERAL
Culture Result: NO GROWTH
Culture Result: NO GROWTH

## 2018-07-25 LAB — MAGNESIUM: Magnesium: 1.9 mg/dL (ref 1.5–2.5)

## 2018-07-25 MED ORDER — cyclobenzaprine (FLEXERIL) tablet 5 mg
5 | Freq: Two times a day (BID) | ORAL | Status: AC | PRN
Start: 2018-07-25 — End: 2018-08-14
  Administered 2018-07-25 – 2018-08-14 (×32): 5 mg via ORAL

## 2018-07-25 MED ORDER — oxyCODONE (ROXICODONE) immediate release tablet 5 mg
5 | ORAL | Status: AC | PRN
Start: 2018-07-25 — End: 2018-08-05

## 2018-07-25 MED ORDER — LORazepam (ATIVAN) tablet 1 mg
1 | Freq: Once | ORAL | Status: AC
Start: 2018-07-25 — End: 2018-07-25
  Administered 2018-07-25: 08:00:00 1 mg via ORAL

## 2018-07-25 MED ORDER — diphenhydrAMINE (BENADRYL) capsule 50 mg
25 | Freq: Once | ORAL | Status: AC
Start: 2018-07-25 — End: 2018-07-25
  Administered 2018-07-26: 02:00:00 50 mg via ORAL

## 2018-07-25 MED ORDER — melatonin Tab 6 mg
3 | Freq: Every evening | ORAL | Status: AC
Start: 2018-07-25 — End: 2018-08-14
  Administered 2018-07-26 – 2018-08-14 (×20): 6 mg via ORAL

## 2018-07-25 MED ORDER — ketorolac (TORADOL) injection 15 mg
15 | Freq: Once | INTRAMUSCULAR | Status: AC
Start: 2018-07-25 — End: 2018-07-25
  Administered 2018-07-25: 08:00:00 15 mg via INTRAVENOUS

## 2018-07-25 MED ORDER — hydrOXYzine HCl (ATARAX) tablet 10 mg
10 | Freq: Once | ORAL | Status: AC
Start: 2018-07-25 — End: 2018-07-25

## 2018-07-25 MED ORDER — hydrOXYzine HCl (ATARAX) tablet 10 mg
10 | ORAL | Status: AC | PRN
Start: 2018-07-25 — End: 2018-08-14
  Administered 2018-07-25 – 2018-08-10 (×12): 10 mg via ORAL

## 2018-07-25 MED ORDER — gabapentin (NEURONTIN) capsule 300 mg
300 | Freq: Three times a day (TID) | ORAL | Status: AC
Start: 2018-07-25 — End: 2018-07-29
  Administered 2018-07-26 – 2018-07-29 (×11): 300 mg via ORAL

## 2018-07-25 MED ORDER — oxyCODONE (ROXICODONE) immediate release tablet 10 mg
5 | ORAL | Status: AC | PRN
Start: 2018-07-25 — End: 2018-08-05
  Administered 2018-07-25 – 2018-08-05 (×60): 10 mg via ORAL

## 2018-07-25 MED FILL — TYLENOL 325 MG TABLET: 325 325 mg | ORAL | Qty: 3

## 2018-07-25 MED FILL — HYDROXYZINE HCL 10 MG TABLET: 10 10 MG | ORAL | Qty: 1

## 2018-07-25 MED FILL — OXYCODONE 5 MG TABLET: 5 5 MG | ORAL | Qty: 2

## 2018-07-25 MED FILL — POLYETHYLENE GLYCOL 3350 17 GRAM ORAL POWDER PACKET: 17 17 gram | ORAL | Qty: 1

## 2018-07-25 MED FILL — LORAZEPAM 1 MG TABLET: 1 1 MG | ORAL | Qty: 1

## 2018-07-25 MED FILL — HEPARIN (PORCINE) 5,000 UNIT/ML INJECTION SOLUTION: 5000 5,000 unit/mL | INTRAMUSCULAR | Qty: 1

## 2018-07-25 MED FILL — MELATONIN 3 MG TABLET: 3 3 mg | ORAL | Qty: 2

## 2018-07-25 MED FILL — CYCLOBENZAPRINE 5 MG TABLET: 5 5 MG | ORAL | Qty: 1

## 2018-07-25 MED FILL — CEFEPIME 2 GRAM SOLUTION FOR INJECTION: 2 2 gram | INTRAMUSCULAR | Qty: 1

## 2018-07-25 MED FILL — GABAPENTIN 300 MG CAPSULE: 300 300 MG | ORAL | Qty: 1

## 2018-07-25 MED FILL — VANCOMYCIN 1,000 MG INTRAVENOUS INJECTION: 1000 1000 mg | INTRAVENOUS | Qty: 1000

## 2018-07-25 MED FILL — SODIUM CHLORIDE 0.9 % INTRAVENOUS PIGGYBACK: 15.00 15.00 mg/kg | INTRAVENOUS | Qty: 250

## 2018-07-25 MED FILL — SODIUM CHLORIDE 0.9 % INTRAVENOUS PIGGYBACK: 2.00 2.00 g | INTRAVENOUS | Qty: 100

## 2018-07-25 MED FILL — KETOROLAC 15 MG/ML INJECTION SOLUTION: 15 15 mg/mL | INTRAMUSCULAR | Qty: 1

## 2018-07-25 MED FILL — LIDODERM 5 % TOPICAL PATCH: 5 5 % | TOPICAL | Qty: 1

## 2018-07-25 MED FILL — DIPHENHYDRAMINE 25 MG CAPSULE: 25 25 mg | ORAL | Qty: 2

## 2018-07-25 MED FILL — SENNOSIDES 8.6 MG-DOCUSATE SODIUM 50 MG TABLET: 8.6-50 8.6-50 mg | ORAL | Qty: 2

## 2018-07-25 MED FILL — METHADONE 5 MG/5 ML ORAL SOLUTION: 5 5 mg/5 mL | ORAL | Qty: 25

## 2018-07-25 NOTE — Unmapped (Signed)
Per team, pt had MRI yesterday, and team to consult ortho today, and patient may be ready for discharge to home on Saturday.    Gates Rigg, RN, Care Coordinator  [phone]

## 2018-07-25 NOTE — Unmapped (Signed)
Infectious Diseases Consult follow-up    Patient Name:  Anne Goodwin   Admit Date:  07/23/2018 Hospital Day: 3  AUTH[facility]ino [facility]uster [facility] 07/25/2018 12:48 PM      Pager [phone]  Reason for consult: Gram negative osteomyelitis/bacteremia    Subjective:  This AM, patient reports feeling tired and very annoyed about about being NPO this morning for potential washout. She perseverated on needing her pain medication for her hip pain (9/10).    Ortho does not recommend current washout regimen per MRI results. Currently, patient is on cefepime and vancomycin.    ROS:  Negative for CP, SOB  Negative for vomiting, positive for nausea  Negative for dysuria, hematuria.    Physical Exam:  BP 126/75 (BP Location: Left arm, Patient Position: Lying)    Pulse 57    Temp 98.1 ??F (36.7 ??C) (Oral)    Resp 16    Ht 5' 5 (1.651 m)    Wt 130 lb (59 kg)    SpO2 100%    BMI 21.63 kg/m??   Gen: thin WF, appears stated age, thin, laying in bed in mild discomfort  Cardio: nl S1/S2, RRR, no r/m/g  Lungs: CTAB, nl WOB  Abd: soft, nontender, BS+  Ext: no clubbing, cyanosis, edema; no nail changes  Skin: no noted rashes or lesions.    Lines: PIV x2    Labs:  Recent Labs      07/23/18   0959  07/24/18   0437  07/25/18   0501   WBC  6.1  6.7  5.5   HGB  10.5*  11.1*  10.3*   HCT  31.2*  33.2*  30.7*   PLT  252  251  258     Recent Labs      07/23/18   0959  07/24/18   0437  07/25/18   0501   NA  138  138  142   K  5.2  3.5  3.8   CL  104  102  106   CO2  28  26  27    BUN  13  12  10    CREATININE  0.52*  0.52*  0.49*   GLUCOSE  87  111*  119*     Lab Results   Component Value Date    ALKPHOS 63 07/24/2018    ALT 10 07/24/2018    AST 20 07/24/2018    BILITOT 0.4 07/24/2018    ALBUMIN 3.4 (L) 07/25/2018    BILIDIRECT 0.09 07/24/2018    PROT 7.6 07/24/2018     Lab Results   Component Value Date    CKTOTAL 249 (H) 07/23/2018        Microbiology:  07/23/18 Positive blood cx x1 for Serratia marcescens (sentivities to follow)    Pertinent  Imaging:  MRI Pelvis w and w/o contrast 07/24/18  IMPRESSION:  ??  Septic arthritis of the left sacroiliac joint without an associated drainable fluid collection.  ??  Approved by William Hamburger, MD on 07/25/2018 9:04 AM EDT    All active medications have been reviewed.  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 Cefepime is the best treatment for this pathogen currently, will await sensitivities before establishing recs on longterm abx. Patient needs at least 48 hours of abx before establish length of treatment. Medical treatment is ok per ortho.  Plan:  -  Acquire daily blood cx until cultures are clear for 48 hours.  - Narrow antibiotics to cefepime only.      Please call with any additional questions or concerns. Thank you for the consult.  Discussed and staffed with Dr. Merilynn Finland.    MICHELLE [facility] 07/25/2018 12:48 PM      Pager [phone]
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
## 2018-07-25 NOTE — Progress Notes (Signed)
ORTHOPAEDIC SURGERY PROGRESS NOTE    ID:  Anne Goodwin is a 37 y.o. female with infected myositis and possible left SI joint infection.  IVDU.  ADMIT DATE:  07/23/2018    S:  Still complaining of pain this am.  Mostly in left buttock region.  Slightly worse than yesterday per patient.    O:  Vitals:    07/25/18 0433 07/25/18 0738 07/25/18 1110 07/25/18 1525   BP: 131/75 137/79 126/75 124/70   BP Location: Right arm Right arm Left arm Left arm   Patient Position: Lying Lying Lying Lying   Pulse: 65 59 57 63   Resp: 18 16 16 16    Temp: 98.2 F (36.8 C) 98.4 F (36.9 C) 98.1 F (36.7 C) 98.4 F (36.9 C)   TempSrc: Oral Oral Oral Oral   SpO2: 100% 99% 100% 98%   Weight:       Height:           General:  NAD; lying in bed  CV/Pulmonary:  Breathing unlabored    MSK:  Left LE  No pain with log roll  TTP over left buttock region  Pain with flexion, FABER, FADIR  No significant erythema or warmth noted  SILT sp/dp/t/sural/saphenous  EHL/TA/GS intact  Toes up and down  Cap refill < 2 sec    Labs:  Lab Results   Component Value Date    WBC 5.5 07/25/2018    HGB 10.3 (L) 07/25/2018    HCT 30.7 (L) 07/25/2018    MCV 81.5 07/25/2018    PLT 258 07/25/2018     Lab Results   Component Value Date    CREATININE 0.49 (L) 07/25/2018    BUN 10 07/25/2018    NA 142 07/25/2018    K 3.8 07/25/2018    CL 106 07/25/2018    CO2 27 07/25/2018     Lab Results   Component Value Date    INR 1.1 07/24/2018         A/P:  Anne Goodwin is a 37 y.o. female IVDU with infected myositis and possible left SI joint infection.    -IV Abx: cefepime per primary  -WBS: WBAT BLE  -Follow-up pelvis MRI read  -PT/OT: eval and treat when able  -Pain control  -OR plans: none at this time; may recommend IR aspirate pending MRI results  -Anticoagulation: per primary  -Dispo planning: pending MRI and further plans    Georgiann Hahn, MD  Orthopedic Surgery  07/25/2018 4:19 PM

## 2018-07-25 NOTE — Progress Notes (Signed)
Department of Internal Medicine  Daily Progress Note      Chief Complaint / Reason for Follow-Up     Anne Goodwin is a 37 y.o. female on hospital day 2. 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    Interval History / Subjective     No acute events overnight.  Seen by addiction medicine service yesterday, started 25 mg methadone today.  Underwent MRI pelvis yesterday, there is evidence of septic left sacroiliac joint without associated drainable fluid collection.     Continues to threaten to leave AMA due to opioid withdrawal.     Review of Systems (Focused)     Denies fevers, chills, headache, chest pain, SOB, abdominal pain, n/v/c/d.  Endorses symptoms of opioid withdrawal (restless legs, anxiety, cravings; no diarrhea)    Medications     Scheduled Meds:   acetaminophen  975 mg Oral 3 times per day    ceFEPime (MAXIPIME) IV extended infusion  2 g Intravenous Q12H    heparin  5,000 Units Subcutaneous 3 times per day    insulin lispro  0-5 Units Subcutaneous TID AC    lidocaine  1 patch Transdermal Q24H    methadone  25 mg Oral Daily 0900    polyethylene glycol  17 g Oral BID    senna-docusate  2 tablet Oral BID     Continuous Infusions:  PRN Meds:  cloNIDine HCl, cyclobenzaprine, dextrose 10% in water **OR** dextrose 10% in water, glucose, hydrOXYzine HCl, ibuprofen, loperamide, naloxone, ondansetron, oxyCODONE **OR** oxyCODONE       Vital Signs     Temp:  [98.1 F (36.7 C)-98.4 F (36.9 C)] 98.1 F (36.7 C)  Heart Rate:  [57-75] 57  Resp:  [16-18] 16  BP: (118-137)/(63-79) 126/75    Intake/Output Summary (Last 24 hours) at 07/25/18 1431  Last data filed at 07/25/18 0929   Gross per 24 hour   Intake              710 ml   Output                0 ml   Net              710 ml         Physical Exam     Constitutional: She is oriented to person, place, and time. She appears well-developedand well-nourished. Not in distress.  Eyes: Conjunctivaeand EOMare normal. No  scleral icterus.   Neck: Trachea midline, No JVDpresent.   Cardiovascular: Normal rate, regular rhythm, normal heart soundsand intact distal pulses.   No murmurheard.  Pulmonary/Chest: Effort normaland breath sounds normal. No respiratory distress. She has no wheezes. She has no rales.   Musculoskeletal: no edema. Did not examine gluteal muscles or hip today.  Neurological: She is alertand oriented to person, place, and time.   Psychiatric: irritable mood, tearful affect.    Laboratory Data         Lab 07/25/18  0501 07/24/18  0437 07/23/18  0959   WBC 5.5 6.7 6.1   HEMOGLOBIN 10.3* 11.1* 10.5*   HEMATOCRIT 30.7* 33.2* 31.2*   MEAN CORPUSCULAR VOLUME 81.5 81.4 82.3   PLATELETS 258 251 252           Lab 07/25/18  0501 07/24/18  0437 07/23/18  0959   SODIUM 142 138 138   POTASSIUM 3.8 3.5 5.2   CHLORIDE 106 102 104   CO2 27 26 28    BUN 10  12 13   CREATININE 0.49* 0.52* 0.52*   GLUCOSE 119* 111* 87           Lab 07/25/18  0501 07/24/18  0437 07/23/18  0959   CALCIUM 9.2 9.6 8.8   MAGNESIUM 1.9  --   --    PHOSPHORUS 3.9 4.2  --            Lab 07/24/18  0437   INR 1.1   PROTHROMBIN TIME 14.7           Lab 07/25/18  0501 07/24/18  0437   ALT  --  10   AST  --  20   ALK PHOS  --  63   BILIRUBIN TOTAL  --  0.4   BILIRUBIN DIRECT  --  0.09   ALBUMIN 3.4* 3.6  3.6             Invalid input(s): WBCCAST, GRANCAST        Lab 07/23/18  0959   CK TOTAL 249*       No results found for: NTPROBNP    Lab Results   Component Value Date    TSH 1.27 05/09/2007           Lab 07/25/18  1323 07/25/18  0128 07/24/18  2116 07/24/18  1924   POC GLU MONITORING DEVICE 69* 87 246* 103*           Diagnostic Studies     MRI Pelvis W and WO contrast   Final Result      X-ray Pelvis 1 or 2-views   Final Result   IMPRESSION:   No acute abnormality.      Report Verified by: Zane Herald, MD at 07/23/2018 10:15 PM EDT      MRI Lumbar spine limited WO   Final Result   IMPRESSION:      Limited study with no suggestion of discitis osteomyelitis in the  lumbar spine.      Report Verified by: Cleatis Polka, MD at 07/23/2018 8:40 PM EDT              Assessment & Plan     Anne Goodwin is a 37 y.o. female on HD# 2 with Infective myositis.  The medical issues being addressed in today's encounter are as follows:    Principal Problem:    Infective myositis    Today's Plan    -started 25 mg methadone today  -changed frequency of oxycodone to Q4H (from Q6H)  -ortho will not intervene on septic left SI joint, no fluid for IR to drain.   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 Patient was admitted from 07/13/18 to 07/18/18 and was getting Vanc and cefepime. She had an TEE on 07/18/18 that did not show any signs of infective endocarditis. She left AMA after her TEE. She was given Linezolid to fill when she left AMA, but she never got it. She presents complaining of increased pain in her left hip worsened with moving her leg and trouble with walking secondary to her pain. She had MRI L spine on 07/13/18 at OSH that did not show any spine involvement of her infection. Ortho evaluated her and they do not anticipate any immediate surgical intervention, but they  will follow her.  - cont IV vanc and cefipime  - 9/22 MRI L spine limited by pt leaving scanner, but spoke to neuroradiology resident (9/23), they feel that this study was sufficient to eliminate concern for discitis.   - 9/22 1 of 2 blood cultures grew gram negative bacilli.   - 9/23 pelvic MRI shows left SI joint is septic, no fluid to drain. Ortho will not intervene at present time    #T2DM  Patient has a history of type 2 diabetes, but she does not take any medications at home. Her A1c on admission is 6.6  - low dose sliding scale insulin regimen    #IVDU  Patient  uses Heroin and fentanyl and her last use was yesterday. HIV testing was negative at OSH.  -COWS protocol for withdrawal started.  -9/24 started methadone 25 mg daily    #Hepatitis Viruses Labs  Her Hep A igM, Hep B core IgM, and Hep C ab were all reactive on 07/14/18 at OSH. Hepatic labs wnl on 9/23.  -Hep B DNA quant and Hep C DNA quant pending.      Nutrition:  Diet Orders          Diet regular starting at 09/24 1245          Code Status: Full Code    Signed:  Rohitha Moudgal  07/25/2018, 2:31 PM

## 2018-07-25 NOTE — Unmapped (Signed)
Problem: Fall Prevention  Goal: Patient will remain free of falls  Assess and monitor vitals signs, neurological status including level of consciousness and orientation.  Reassess fall risk per hospital policy.    Ensure arm band on, uncluttered walking paths in room, adequate room lighting, call light and overbed table within reach, bed in low position, wheels locked, side rails up per policy (excluding SNF), and non-skid footwear provided.    Outcome: Progressing  Patient educated on falls precautions. Bedside table and call light within reach. Bed is in lowest position with wheels locked. Will continue to monitor.

## 2018-07-25 NOTE — Unmapped (Signed)
Problem: Acute Pain  Patient's pain progressing toward patient's stated pain goal   Goal: Patient displays improved well-being such as baseline levels for pulse, BP, respirations and relaxed muscle tone or body posture  Outcome: Progressing

## 2018-07-25 NOTE — Plan of Care (Signed)
Problem: Glucose Imbalance related to diabetes disease process  Goal: Clinical indication of glucose balance is achieved  Outcome: Progressing  Patient's blood glucose monitored per orders.

## 2018-07-25 NOTE — Unmapped (Signed)
Brief Orthopaedic Progress Note    Patient's MRI was reviewed and we recommend medical management of L SI joint infection. It doesn't appear there is enough fluid to have an IR procedure performed. We will continue to follow to see improvement clinically and with inflammatory markers.    Reviewed with Chief Resident, Lavon Paganini    Dimitri Ped  Ortho Intern
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
## 2018-07-26 ENCOUNTER — Inpatient Hospital Stay: Admit: 2018-07-26 | Payer: PRIVATE HEALTH INSURANCE | Primary: Family

## 2018-07-26 LAB — RENAL FUNCTION PANEL W/EGFR
Albumin: 3.8 g/dL (ref 3.5–5.7)
Anion Gap: 10 mmol/L (ref 3–16)
BUN: 10 mg/dL (ref 7–25)
CO2: 26 mmol/L (ref 21–33)
Calcium: 9.6 mg/dL (ref 8.6–10.3)
Chloride: 102 mmol/L (ref 98–110)
Creatinine: 0.58 mg/dL (ref 0.60–1.30)
Glucose: 149 mg/dL (ref 70–100)
Osmolality, Calculated: 288 mOsm/kg (ref 278–305)
Phosphorus: 3.9 mg/dL (ref 2.1–4.7)
Potassium: 3.8 mmol/L (ref 3.5–5.3)
Sodium: 138 mmol/L (ref 133–146)
eGFR AA CKD-EPI: >90 See note.
eGFR NONAA CKD-EPI: >90 See note.

## 2018-07-26 LAB — CBC
Hematocrit: 32.4 % (ref 35.0–45.0)
Hemoglobin: 10.6 g/dL (ref 11.7–15.5)
MCH: 27 pg (ref 27.0–33.0)
MCHC: 32.8 g/dL (ref 32.0–36.0)
MCV: 82.5 fL (ref 80.0–100.0)
MPV: 7.5 fL (ref 7.5–11.5)
Platelets: 281 10*3/uL (ref 140–400)
RBC: 3.93 10*6/uL (ref 3.80–5.10)
RDW: 14.8 % (ref 11.0–15.0)
WBC: 5.1 10*3/uL (ref 3.8–10.8)

## 2018-07-26 LAB — BLOOD CULTURE-PERIPHERAL
Culture Result: NO GROWTH
Culture Result: NO GROWTH

## 2018-07-26 LAB — HEPATITIS B SURFACE ANTIGEN: Hep B Surface Ag: NONREACTIVE

## 2018-07-26 LAB — POC GLU MONITORING DEVICE
POC Glucose Monitoring Device: 71 mg/dL (ref 70–100)
POC Glucose Monitoring Device: 103 mg/dL (ref 70–100)

## 2018-07-26 MED ORDER — lidocaine-EPINEPHrine 1 %-1:100,000 injection 1 mL
1 | Freq: Once | INTRAMUSCULAR | Status: AC
Start: 2018-07-26 — End: 2018-07-26
  Administered 2018-07-26: 20:00:00 1 mL via SUBCUTANEOUS

## 2018-07-26 MED FILL — SENNOSIDES 8.6 MG-DOCUSATE SODIUM 50 MG TABLET: 8.6-50 8.6-50 mg | ORAL | Qty: 2

## 2018-07-26 MED FILL — HEPARIN (PORCINE) 5,000 UNIT/ML INJECTION SOLUTION: 5000 5,000 unit/mL | INTRAMUSCULAR | Qty: 1

## 2018-07-26 MED FILL — OXYCODONE 5 MG TABLET: 5 5 MG | ORAL | Qty: 2

## 2018-07-26 MED FILL — TYLENOL 325 MG TABLET: 325 325 mg | ORAL | Qty: 3

## 2018-07-26 MED FILL — MELATONIN 3 MG TABLET: 3 3 mg | ORAL | Qty: 2

## 2018-07-26 MED FILL — SODIUM CHLORIDE 0.9 % INTRAVENOUS PIGGYBACK: 2.00 2.00 g | INTRAVENOUS | Qty: 100

## 2018-07-26 MED FILL — POLYETHYLENE GLYCOL 3350 17 GRAM ORAL POWDER PACKET: 17 17 gram | ORAL | Qty: 1

## 2018-07-26 MED FILL — METHADONE 5 MG/5 ML ORAL SOLUTION: 5 5 mg/5 mL | ORAL | Qty: 25

## 2018-07-26 MED FILL — CYCLOBENZAPRINE 5 MG TABLET: 5 5 MG | ORAL | Qty: 1

## 2018-07-26 MED FILL — GABAPENTIN 300 MG CAPSULE: 300 300 MG | ORAL | Qty: 1

## 2018-07-26 MED FILL — CEFEPIME 2 GRAM SOLUTION FOR INJECTION: 2 2 gram | INTRAMUSCULAR | Qty: 1

## 2018-07-26 MED FILL — LIDOCAINE 1 %-EPINEPHRINE 1:100,000 INJECTION SOLUTION: 1 1 %-1:100,000 | INTRAMUSCULAR | Qty: 20

## 2018-07-26 MED FILL — LIDODERM 5 % TOPICAL PATCH: 5 5 % | TOPICAL | Qty: 1

## 2018-07-26 NOTE — Care Coordination-Inpatient (Addendum)
Per team, pt with infective myositis, will need LT IV ABX, and SNF placement, and may potentially be ready for discharge early next week.    RN/CM attempted to meet with patient to complete psychosocial assessment, but patient is out of room in echocardiography.    Gates Rigg, RN, Care Coordinator  [phone]

## 2018-07-26 NOTE — Progress Notes (Signed)
Procedure Note  Incision and drainage of abscess  Patient had abscess on extensor aspect of left forearm. Skin was cleaned with iodine and numbed with 1% Lidocaine with epinephrine. Small incision made over the top of the abscess with scalpel. Minimal amount Serosanguineous drainage was expressed from the abscess. It was wiped with 4X4 gauze and the area was cleaned again with iodine. A gauze pad was tapped over the incision. Patient tolerated procedure with minimal pain.

## 2018-07-26 NOTE — Unmapped (Signed)
Problem: Fall Prevention  Goal: Patient will remain free of falls  Assess and monitor vitals signs, neurological status including level of consciousness and orientation.  Reassess fall risk per hospital policy.    Ensure arm band on, uncluttered walking paths in room, adequate room lighting, call light and overbed table within reach, bed in low position, wheels locked, side rails up per policy (excluding SNF), and non-skid footwear provided.    Outcome: Progressing  Patient educated on falls precautions. Bedside table and call light within reach. Bed is in lowest position with wheels locked. Will continue to monitor.

## 2018-07-26 NOTE — Consults (Signed)
[facility] Clinical Pharmacy Service: Vancomycin Consult    Primary team has discontinued vancomycin therapy.  Pharmacy will sign off consult at this time.  If vancomycin therapy is reinitiated, please feel free to consult pharmacy services again.    Thank you for the consult.    Rushie Chestnut, PharmD  Clinical Pharmacy Specialist- Internal Medicine  Pager: [phone]  On-Call/ Weekend Pager: [phone]  07/26/18  8:58 AM

## 2018-07-26 NOTE — Unmapped (Signed)
ORTHOPAEDIC SURGERY PROGRESS NOTE    ID:  Trenise Turay is a 37 y.o. female with infected myositis and possible left SI joint infection.  IVDU.  ADMIT DATE:  07/23/2018    S:  Pain somewhat improving. Able to ambulate independently with PT.     O:  Vitals:    07/25/18 1525 07/25/18 1900 07/25/18 2300 07/26/18 0500   BP: 124/70 133/80 121/68 136/84   BP Location: Left arm Left arm Right arm Right arm   Patient Position: Lying Lying Lying Lying   Pulse: 63 65 60 54   Resp: 16 16 16 16    Temp: 98.4 ??F (36.9 ??C) 98.2 ??F (36.8 ??C) 98 ??F (36.7 ??C) 97.3 ??F (36.3 ??C)   TempSrc: Oral Oral Oral Oral   SpO2: 98% 100% 99% 99%   Weight:       Height:           General:  NAD; lying in bed  CV/Pulmonary:  Breathing unlabored    MSK:  Left LE  Minimal pain with log roll  TTP over left buttock region  Pain with flexion, FABER, FADIR  No significant erythema or warmth noted  SILT sp/dp/t/sural/saphenous  EHL/TA/GS intact  Toes up and down  Cap refill < 2 sec    Labs:  Lab Results   Component Value Date    WBC 5.5 07/25/2018    HGB 10.3 (L) 07/25/2018    HCT 30.7 (L) 07/25/2018    MCV 81.5 07/25/2018    PLT 258 07/25/2018     Lab Results   Component Value Date    CREATININE 0.49 (L) 07/25/2018    BUN 10 07/25/2018    NA 142 07/25/2018    K 3.8 07/25/2018    CL 106 07/25/2018    CO2 27 07/25/2018     Lab Results   Component Value Date    INR 1.1 07/24/2018         A/P:  Matilda Campbell-Moxley is a 37 y.o. female IVDU with infected myositis and possible left SI joint infection.    -IV Abx: cefepime per primary  -WBS: WBAT BLE  -PT/OT: eval and treat when able  -Pain control  -OR plans: none at this time  -Anticoagulation: per primary  -Dispo planning: pending clinical improvement    Sonda Rumble, MD  Orthopedic Surgery  07/26/2018 6:22 AM

## 2018-07-26 NOTE — Med Student Consult (Signed)
Infectious Diseases Consult follow-up    Patient Name:  Anne Goodwin   Admit Date:  07/23/2018 Hospital Day: 4  AUTHOR: [facility] 07/26/2018 1:51 PM      Pager [phone]  Reason for consult: Gram negative osteomyelitis/bacteremia    Subjective:  This AM, patient reports feeling better. Still has ongoing hip and back pain. Complaining today of right hand numbness that she has had for many years from a previous surgery and right shoulder knot.    ROS:  Negative for CP, SOB  Negative for vomiting, positive for nausea  Negative for dysuria, hematuria.    Physical Exam:  BP 109/72 (BP Location: Left arm, Patient Position: Lying)   Pulse 60   Temp 98.8 F (37.1 C) (Oral)   Resp 16   Ht 5' 5 (1.651 m)   Wt 130 lb (59 kg)   SpO2 99%   BMI 21.63 kg/m   Gen: thin WF, appears stated age, thin, laying in bed in mild discomfort  Cardio: nl S1/S2, RRR, no r/m/g  Lungs: CTAB, nl WOB  Abd: soft, nontender, BS+  Ext: no clubbing, cyanosis, edema; no nail changes  MSK; 5/5 strength in interossei, thumb opposition.  Skin: no noted rashes or lesions.    Lines: PIV x2    Labs:  Recent Labs      07/24/18   0437  07/25/18   0501  07/26/18   1055   WBC  6.7  5.5  5.1   HGB  11.1*  10.3*  10.6*   HCT  33.2*  30.7*  32.4*   PLT  251  258  281     Recent Labs      07/24/18   0437  07/25/18   0501  07/26/18   1055   NA  138  142  138   K  3.5  3.8  3.8   CL  102  106  102   CO2  26  27  26    BUN  12  10  10    CREATININE  0.52*  0.49*  0.58*   GLUCOSE  111*  119*  149*     Lab Results   Component Value Date    ALKPHOS 63 07/24/2018    ALT 10 07/24/2018    AST 20 07/24/2018    BILITOT 0.4 07/24/2018    ALBUMIN 3.8 07/26/2018    BILIDIRECT 0.09 07/24/2018    PROT 7.6 07/24/2018     Lab Results   Component Value Date    CKTOTAL 249 (H) 07/23/2018        Microbiology:  07/23/18 Positive blood cx x1 for Serratia marcescens    Pertinent Imaging:  MRI Pelvis w and w/o contrast 07/24/18  IMPRESSION:    Septic arthritis of the  left sacroiliac joint without an associated drainable fluid collection.    Approved by William Hamburger, MD on 07/25/2018 9:04 AM EDT    All active medications have been reviewed.  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 Cefepime is the best treatment for this pathogen currently, and will have to wait for last set of cultures to clear before starting 4 weeks of cefepime IV.  In terms of her hepatitis panel, she appears to have cleared hepatitis C. Will need hepatitis B surface antibody to access previous exposure  Plan:  - Will await for  cultures on 9/24 to be clear for 48 hours, then can start 4 weeks of cefepime.  - Please acquire hepatitis B surface antibody to assess prior exposure and resolution of infection.    Please call with any additional questions or concerns. Thank you for the consult.  Discussed and staffed with Dr. Merilynn Finland.    [facility]HEL[facility] 07/26/2018 1:51 PM      Pager [phone]

## 2018-07-26 NOTE — Unmapped (Addendum)
[name]   RN/CM/Social Work Psychosocial Assessment     Anne Goodwin  [id number]  37 y.o.  female  Multiracial  Marital Status: Divorced    Leg pain [M79.606]    Referred by: Case Finding/Chart Review  Referred Reason: Care Coordination/DIscharge Planning      History    Past Medical History:   Diagnosis Date   ??? Abnormal Pap smear    ??? Anxiety    ??? Back pain    ??? Bipolar disorder (CMS Dx)    ??? Depression    ??? Diabetes mellitus (CMS Dx)    ??? Ectopic pregnancy    ??? Hashimoto's thyroiditis    ??? OCD (obsessive compulsive disorder)    ??? Pituitary adenoma (CMS Dx)        History   Drug Use   ??? Frequency: 1.0 time per week   ??? Types: Heroin       History   Alcohol Use No       Mental Health History: hx of bipolar depression/OCD    Mental Status    Current Mental Status: Awake, Oriented to Person, Oriented to Place, Oriented to Situation  Mental Status Prior to Admission: Unable to Assess  Activities of Daily Living: Independent  ADL Comments: patient is independent with bathing, toileting, and dressing    Current Living Arrangements    Current Living Arrangements: Homeless  Type of Living Arrangement: Other (See Comment) (homeless)  Living Arrangements Comments: patient reports she has been homeless off and on for several years    One Story or Two (check all that apply): Other (See Comment) (n/a)  Enter the number of steps and rails to enter the residence: n/a  Enter the number of steps and rails inside the residence: n/a    Support Systems      Next of Kin/Contact Person: Anne Goodwin  Next of Kin Relationship:  Sister  Next of Kin Phone Number: [phone]            [facility] Used Prior to Admission: No             Cultural/Spiritual/Language Barriers    Religious/Cultural Factors: Jehovah's Witness    Other Pertinent Data                  Sports coach for Mental Health IssuesPrior to Admission: No          Durable Medical Equipment Prior to Admission: No              Name/number of PCP: no PCP  Pharmacy: [facility]    Assessment/Plan    RN/CM reviewed chart and met with patient and explained role; Demographics verified, although address in system is patient's mailing address only as patient is homeless. Per H&P, patient with history of IV drug abuse presenting with left-sided gluteal and low back pain. Notably she was admitted to outside hospital from 07/13/18 - 07/18/18 for this and found to have myositis of the piriformis and gluteus maximus, as well as possible fluid collection in the joint space of the left hip and SI joint. She was started on vancomycin and cefepime. Blood cultures were negtive and TEE did not show any evidence of endocarditis. The patient left the hospital against medical advice, but came back to our emergency department for increased pain.    Patient reports she has has been homeless off and on for several years. Patient has 4 children that she is not currently in touch with. Patient reports  she applied once for disability and was denied, and is interested in reapplying.  Patient's main contact is sister Anne Goodwin at [phone], but patient is not currently interested in establishing Advanced Directives. Patient is in process of reapplying for food stamps and requested that this RN/CM notify [facility] that pt is in the hospital.  RN/CM faxed hospital excuse to [facility] at [phone].  RN/CM to continue to follow for ongoing discharge planning as patient is in need of SNF placement for LT IV ABX. Patient is also on Methadone.  ??    Patient/Family aware and taking part in the discharge plan.  Patient and family were offered a post-acute provider list as applicable to the discharge plan and insurance provider.  Patient and family were given the freedom to choose providers and financial interest(s) were disclosed as appropriate.    This assessment has been reviewed with the multi-disciplinary team.    Anne Rigg, RN, Care Coordinator  [phone]

## 2018-07-26 NOTE — Unmapped (Signed)
Problem: Glucose Imbalance related to diabetes disease process  Goal: Clinical indication of glucose balance is achieved  Outcome: Progressing  Blood sugar checked before meals. Insulin given per MAR. Monitored for signs and symptoms of hypo/hyperglycemia.

## 2018-07-26 NOTE — Unmapped (Signed)
Department of Internal Medicine  Daily Progress Note      Chief Complaint / Reason for Follow-Up     Anne Goodwin is a 37 y.o. female on hospital day 3. 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    Interval History / Subjective     Reports that she is feeling well this morning. Pain is well controlled. She experienced mild withdrawal symptoms last night, but says that the methadone is helping.   Complains of a new swollen nodule on forearm that she discovered yesterday.   Going for TTE today.  Blood cultures yesterday were ngtd, will order cultures again today.    Review of Systems (Focused)     Denies fevers, chills, headache, chest pain, SOB, abdominal pain, n/v/c/d.  Endorses mild symptoms of opioid withdrawal (restless legs, anxiety, cravings; no diarrhea)      Medications     Scheduled Meds:  ??? acetaminophen  975 mg Oral 3 times per day   ??? ceFEPime (MAXIPIME) IV extended infusion  2 g Intravenous Q12H   ??? gabapentin  300 mg Oral TID   ??? heparin  5,000 Units Subcutaneous 3 times per day   ??? insulin lispro  0-5 Units Subcutaneous TID AC   ??? lidocaine  1 patch Transdermal Q24H   ??? lidocaine-EPINEPHrine  1 mL Subcutaneous Once   ??? melatonin  6 mg Oral Nightly (2100)   ??? methadone  25 mg Oral Daily 0900   ??? polyethylene glycol  17 g Oral BID   ??? senna-docusate  2 tablet Oral BID     Continuous Infusions:  PRN Meds:  cloNIDine HCl, cyclobenzaprine, dextrose 10% in water **OR** dextrose 10% in water, glucose, hydrOXYzine HCl, ibuprofen, loperamide, naloxone, ondansetron, oxyCODONE **OR** oxyCODONE       Vital Signs     Temp:  [97.3 ??F (36.3 ??C)-98.8 ??F (37.1 ??C)] 98.8 ??F (37.1 ??C)  Heart Rate:  [54-66] 60  Resp:  [16] 16  BP: (109-136)/(7-84) 109/72    Intake/Output Summary (Last 24 hours) at 07/26/18 1354  Last data filed at 07/26/18 0835   Gross per 24 hour   Intake              480 ml   Output                0 ml   Net              480 ml         Physical Exam      Constitutional: She is oriented to person, place, and time. She appears well-developed??and well-nourished. Not in distress.  Eyes: Conjunctivae??and EOM??are normal. No scleral icterus.   Neck: Trachea midline, No JVD??present.   Cardiovascular: Normal rate, regular rhythm, normal heart sounds??and intact distal pulses. ??  No murmur??heard.  Pulmonary/Chest: Effort normal??and breath sounds normal. No respiratory distress. She has no wheezes. She has no rales.   Musculoskeletal: no edema. Did not examine gluteal muscles or hip today. New 3 cm erythematous nodule on forearm, fluctuant, tender to palpate.  Neurological: She is alert??and oriented to person, place, and time.   Psychiatric: irritable mood, tearful affect.    Laboratory Data         Lab 07/26/18  1055 07/25/18  0501 07/24/18  0437 07/23/18  0959   WBC 5.1 5.5 6.7 6.1   HEMOGLOBIN 10.6* 10.3* 11.1* 10.5*   HEMATOCRIT 32.4* 30.7* 33.2* 31.2*   MEAN CORPUSCULAR VOLUME 82.5  81.5 81.4 82.3   PLATELETS 281 258 251 252           Lab 07/26/18  1055 07/25/18  0501 07/24/18  0437 07/23/18  0959   SODIUM 138 142 138 138   POTASSIUM 3.8 3.8 3.5 5.2   CHLORIDE 102 106 102 104   CO2 26 27 26 28    BUN 10 10 12 13    CREATININE 0.58* 0.49* 0.52* 0.52*   GLUCOSE 149* 119* 111* 87           Lab 07/26/18  1055 07/25/18  0501 07/24/18  0437 07/23/18  0959   CALCIUM 9.6 9.2 9.6 8.8   MAGNESIUM  --  1.9  --   --    PHOSPHORUS 3.9 3.9 4.2  --            Lab 07/24/18  0437   INR 1.1   PROTHROMBIN TIME 14.7           Lab 07/26/18  1055 07/25/18  0501 07/24/18  0437   ALT  --   --  10   AST  --   --  20   ALK PHOS  --   --  63   BILIRUBIN TOTAL  --   --  0.4   BILIRUBIN DIRECT  --   --  0.09   ALBUMIN 3.8 3.4* 3.6   3.6             Invalid input(s): WBCCAST, GRANCAST        Lab 07/23/18  0959   CK TOTAL 249*       No results found for: NTPROBNP    Lab Results   Component Value Date    TSH 1.27 05/09/2007           Lab 07/26/18  1206 07/25/18  1838 07/25/18  1323 07/25/18  0128   POC  GLU MONITORING DEVICE 71 115* 69* 87           Diagnostic Studies     TTE pending      Assessment & Plan     Anne Goodwin is a 37 y.o. female on HD# 3 with Infective myositis.  The medical issues being addressed in today's encounter are as follows:    Principal Problem:    Infective myositis    Today's Plan  -cont methadone  -cont IV cefipime. Will probably need long term (~5 weeks) abx due to bone involvement (left SI joint).  -spoke to Child psychotherapist about placement; she will most likely get a PICC after blood cultures show no growth after a few days, and we will not send her home with that given hx IVDU.  -obtain more blood cultures  -will lance abscess on forearm tomorrow (pt is at TTE right now).  ??  #Left SI joint septic arthritis  Patient had MRI of the pelvis on 9/14 at OSH showing left sided piriformis and gluteus maximus myositis and possible SI joint septic arthritis. Patient was admitted from 07/13/18 to 07/18/18 and was getting Vanc and cefepime. She had an TEE on 07/18/18 that did not show any signs of infective endocarditis. She left AMA after her TEE. She was given Linezolid to fill when she left AMA, but she never got it. She presents complaining of increased pain in her left hip worsened with moving her leg and trouble with walking secondary to her pain. She had MRI L spine on 07/13/18 at OSH that did not show any spine involvement of  her infection. Ortho evaluated her and they do not anticipate any immediate surgical intervention, but they will follow her.  -cont cefipime  - 9/22 MRI L spine limited by pt leaving scanner, but spoke to neuroradiology resident (9/23), they feel that this study was sufficient to eliminate concern for discitis.   - 9/22 1 of 2 blood cultures grew gram negative bacilli.   - 9/23 pelvic MRI shows left SI joint is septic, no fluid to drain. Ortho will not intervene at present time  - blood cultures pending  ??  #T2DM  Patient has a history of type 2 diabetes, but she  does not take any medications at home. Her A1c on admission is 6.6  - low dose sliding scale insulin regimen  ??  #IVDU  Patient uses Heroin and fentanyl and her last use was yesterday. HIV testing was negative at OSH.  -COWS protocol for withdrawal started.  -9/24 started methadone 25 mg daily    #Anemia  Hgb stable at ~10.5. Most likely iron deficiency anemia given pt demographic.  -ordered iron studies.  ??  #Hepatitis Viruses Labs  Her Hep A igM, Hep B core IgM, and Hep C ab were all reactive on 07/14/18 at OSH. Hepatic labs wnl on 9/23.  -Hep C DNA quant is low (<15)  - Hep B Surface Antigen pending??      Nutrition:  Diet Orders          Diet regular starting at 09/24 1245          Code Status: Full Code    Signed:  Lafe Garin  07/26/2018

## 2018-07-27 LAB — CBC
Hematocrit: 31.9 % (ref 35.0–45.0)
Hemoglobin: 10.5 g/dL (ref 11.7–15.5)
MCH: 27.3 pg (ref 27.0–33.0)
MCHC: 33.1 g/dL (ref 32.0–36.0)
MCV: 82.6 fL (ref 80.0–100.0)
MPV: 7.5 fL (ref 7.5–11.5)
Platelets: 287 10*3/uL (ref 140–400)
RBC: 3.86 10*6/uL (ref 3.80–5.10)
RDW: 14.8 % (ref 11.0–15.0)
WBC: 5.8 10*3/uL (ref 3.8–10.8)

## 2018-07-27 LAB — IRON STUDIES
% Iron Saturation: 12.9 % (ref 15.0–55.0)
Iron: 45 ug/dL (ref 50–212)
TIBC: 348 ug/dL (ref 265–497)

## 2018-07-27 LAB — RENAL FUNCTION PANEL W/EGFR
Albumin: 3.5 g/dL (ref 3.5–5.7)
Anion Gap: 9 mmol/L (ref 3–16)
BUN: 13 mg/dL (ref 7–25)
CO2: 28 mmol/L (ref 21–33)
Calcium: 9.4 mg/dL (ref 8.6–10.3)
Chloride: 103 mmol/L (ref 98–110)
Creatinine: 0.48 mg/dL (ref 0.60–1.30)
Glucose: 94 mg/dL (ref 70–100)
Osmolality, Calculated: 290 mOsm/kg (ref 278–305)
Phosphorus: 4.5 mg/dL (ref 2.1–4.7)
Potassium: 4.1 mmol/L (ref 3.5–5.3)
Sodium: 140 mmol/L (ref 133–146)
eGFR AA CKD-EPI: >90 See note.
eGFR NONAA CKD-EPI: >90 See note.

## 2018-07-27 LAB — HEPATITIS B SURFACE ANTIBODY, QUANTITATIVE
HBSAB NUMBER: 23.45 m[IU]/mL (ref 0.00–7.99)
Hep B S Ab: REACTIVE

## 2018-07-27 LAB — FERRITIN: Ferritin: 42.5 ng/mL (ref 11.0–306.8)

## 2018-07-27 LAB — MAGNESIUM: Magnesium: 2 mg/dL (ref 1.5–2.5)

## 2018-07-27 LAB — POC GLU MONITORING DEVICE
POC Glucose Monitoring Device: 94 mg/dL (ref 70–100)
POC Glucose Monitoring Device: 89 mg/dL (ref 70–100)

## 2018-07-27 MED ORDER — lidocaine 10 mg/mL (1 %) injection
10 | INTRAMUSCULAR | Status: AC
Start: 2018-07-27 — End: 2018-07-27
  Administered 2018-07-27: 19:00:00 200

## 2018-07-27 MED ORDER — lidocaine-EPINEPHrine 1 %-1:100,000 injection 20 mL
1 | Freq: Once | INTRAMUSCULAR | Status: AC
Start: 2018-07-27 — End: 2018-07-28

## 2018-07-27 MED FILL — SENNOSIDES 8.6 MG-DOCUSATE SODIUM 50 MG TABLET: 8.6-50 8.6-50 mg | ORAL | Qty: 2

## 2018-07-27 MED FILL — HEPARIN (PORCINE) 5,000 UNIT/ML INJECTION SOLUTION: 5000 5,000 unit/mL | INTRAMUSCULAR | Qty: 1

## 2018-07-27 MED FILL — OXYCODONE 5 MG TABLET: 5 5 MG | ORAL | Qty: 2

## 2018-07-27 MED FILL — OXYCODONE 5 MG TABLET: 5 5 MG | ORAL | Qty: 1

## 2018-07-27 MED FILL — LIDOCAINE HCL 10 MG/ML (1 %) INJECTION SOLUTION: 10 10 mg/mL (1 %) | INTRAMUSCULAR | Qty: 20

## 2018-07-27 MED FILL — METHADONE 5 MG/5 ML ORAL SOLUTION: 5 5 mg/5 mL | ORAL | Qty: 25

## 2018-07-27 MED FILL — CYCLOBENZAPRINE 5 MG TABLET: 5 5 MG | ORAL | Qty: 1

## 2018-07-27 MED FILL — SODIUM CHLORIDE 0.9 % INTRAVENOUS PIGGYBACK: 2.00 2.00 g | INTRAVENOUS | Qty: 100

## 2018-07-27 MED FILL — GABAPENTIN 300 MG CAPSULE: 300 300 MG | ORAL | Qty: 1

## 2018-07-27 MED FILL — LIDOCAINE 1 %-EPINEPHRINE 1:100,000 INJECTION SOLUTION: 1 1 %-1:100,000 | INTRAMUSCULAR | Qty: 20

## 2018-07-27 MED FILL — LIDODERM 5 % TOPICAL PATCH: 5 5 % | TOPICAL | Qty: 1

## 2018-07-27 MED FILL — CEFEPIME 2 GRAM SOLUTION FOR INJECTION: 2 2 gram | INTRAMUSCULAR | Qty: 1

## 2018-07-27 MED FILL — POLYETHYLENE GLYCOL 3350 17 GRAM ORAL POWDER PACKET: 17 17 gram | ORAL | Qty: 1

## 2018-07-27 MED FILL — TYLENOL 325 MG TABLET: 325 325 mg | ORAL | Qty: 3

## 2018-07-27 MED FILL — MELATONIN 3 MG TABLET: 3 3 mg | ORAL | Qty: 2

## 2018-07-27 MED FILL — IBUPROFEN 200 MG TABLET: 200 200 MG | ORAL | Qty: 2

## 2018-07-27 NOTE — Consults (Signed)
Inpatient consult to PICC Team  Consult performed by: Reshaun Briseno  Consult ordered by: Despina Hick  Assessment/Recommendations: PICC placed in right Brachial. 4 Fr SL 44 cm TL with 1 cm EVL. No complications noted during the procedure. Patient tolerated great.

## 2018-07-27 NOTE — Plan of Care (Signed)
Problem: Glucose Imbalance related to diabetes disease process  Goal: Clinical indication of glucose balance is achieved  Outcome: Progressing  RN will check patients blood sugars per MD orders, and/or when RN and/or patient feels the need for a blood sugar check. RN will notify patient of the blood sugar readings and will follow protocol for actions to take. RN will continue to monitor patient and blood sugar readings. If requested, RN will provide patient and/or family member(s) with diabetic education.

## 2018-07-27 NOTE — Nursing Note (Signed)
Surgery team at bedside to lance abscess on R forearm. Abscess was drained and packed. Gauze placed over abscess. Surgery team to come by in the AM to look at arm. Night RN to repack abscess.

## 2018-07-27 NOTE — Unmapped (Signed)
PICC consult acknowledged by PICC Team

## 2018-07-27 NOTE — Care Coordination-Inpatient (Signed)
Per team, pt had I&D of abscess, and will need LT IV ABX, and may be a candidate for 4 [place] tomorrow.    Gates Rigg, RN, Care Coordinator  [phone]

## 2018-07-27 NOTE — Unmapped (Signed)
Department of Internal Medicine  Daily Progress Note      Chief Complaint / Reason for Follow-Up     Anne Goodwin is a 37 y.o. female on hospital day 4. 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    Interval History / Subjective     Feels well overall this morning, continues to complain of back pain.   Right forearm abscess drained purulent material, appears more swollen this morning. Consulted Acute Care Surgery.    Review of Systems (Focused)     Denies fevers, chills, headache, lightheadedness, chest pain, shortness of breath, cough, abdominal pain, nausea, vomiting, constipation, diarrhea.       Medications     Scheduled Meds:  ??? acetaminophen  975 mg Oral 3 times per day   ??? ceFEPime (MAXIPIME) IV extended infusion  2 g Intravenous Q12H   ??? gabapentin  300 mg Oral TID   ??? heparin  5,000 Units Subcutaneous 3 times per day   ??? insulin lispro  0-5 Units Subcutaneous TID AC   ??? lidocaine  1 patch Transdermal Q24H   ??? melatonin  6 mg Oral Nightly (2100)   ??? methadone  25 mg Oral Daily 0900   ??? polyethylene glycol  17 g Oral BID   ??? senna-docusate  2 tablet Oral BID     Continuous Infusions:  PRN Meds:  cloNIDine HCl, cyclobenzaprine, dextrose 10% in water **OR** dextrose 10% in water, glucose, hydrOXYzine HCl, ibuprofen, loperamide, naloxone, ondansetron, oxyCODONE **OR** oxyCODONE       Vital Signs     Temp:  [97.8 ??F (36.6 ??C)-99 ??F (37.2 ??C)] 97.8 ??F (36.6 ??C)  Heart Rate:  [57-72] 61  Resp:  [16-20] 20  BP: (105-122)/(61-68) 111/66    Intake/Output Summary (Last 24 hours) at 07/27/18 1230  Last data filed at 07/26/18 2016   Gross per 24 hour   Intake              120 ml   Output                0 ml   Net              120 ml         Physical Exam     Constitutional: She is oriented to person, place, and time. She appears well-developed??and well-nourished. Not in distress.  Eyes: Conjunctivae??and EOM??are normal. No scleral icterus.   Neck: Trachea midline, No JVD??present.    Cardiovascular: Normal rate, regular rhythm, normal heart sounds??and intact distal pulses. ??  No murmur??heard.  Pulmonary/Chest: Effort normal??and breath sounds normal. No respiratory distress. She has no wheezes. She has no rales.   Musculoskeletal: no edema. SI tenderness to palpation, but able to ambulate with crutches. Right forearm is bandaged after it was lanced yesterday  Neurological: She is alert??and oriented to person, place, and time.   Psychiatric: normal mood, normal affect    Laboratory Data         Lab 07/27/18  0333 07/26/18  1055 07/25/18  0501 07/24/18  0437   WBC 5.8 5.1 5.5 6.7   HEMOGLOBIN 10.5* 10.6* 10.3* 11.1*   HEMATOCRIT 31.9* 32.4* 30.7* 33.2*   MEAN CORPUSCULAR VOLUME 82.6 82.5 81.5 81.4   PLATELETS 287 281 258 251           Lab 07/27/18  0333 07/26/18  1055 07/25/18  0501 07/24/18  0437   SODIUM 140 138 142 138   POTASSIUM  4.1 3.8 3.8 3.5   CHLORIDE 103 102 106 102   CO2 28 26 27 26    BUN 13 10 10 12    CREATININE 0.48* 0.58* 0.49* 0.52*   GLUCOSE 94 149* 119* 111*           Lab 07/27/18  0333 07/26/18  1055 07/25/18  0501 07/24/18  0437   CALCIUM 9.4 9.6 9.2 9.6   MAGNESIUM 2.0  --  1.9  --    PHOSPHORUS 4.5 3.9 3.9 4.2           Lab 07/24/18  0437   INR 1.1   PROTHROMBIN TIME 14.7           Lab 07/27/18  0333 07/26/18  1055 07/25/18  0501 07/24/18  0437   ALT  --   --   --  10   AST  --   --   --  20   ALK PHOS  --   --   --  63   BILIRUBIN TOTAL  --   --   --  0.4   BILIRUBIN DIRECT  --   --   --  0.09   ALBUMIN 3.5 3.8 3.4* 3.6   3.6             Invalid input(s): WBCCAST, GRANCAST        Lab 07/23/18  0959   CK TOTAL 249*       No results found for: NTPROBNP    Lab Results   Component Value Date    TSH 1.27 05/09/2007           Lab 07/27/18  0820 07/26/18  1647 07/26/18  1206 07/25/18  1838   POC GLU MONITORING DEVICE 94 103* 71 115*           Diagnostic Studies     No new studies      Assessment & Plan     Anne Goodwin is a 36 y.o. female on HD# 4 with Infective myositis.   The medical issues being addressed in today's encounter are as follows:    Principal Problem:    Infective myositis    ??  Today's Plan  -cont methadone  -cont IV cefipime. Will need long term (4 weeks total) abx due to bone involvement (left SI joint).  -got PICC line  -ACS drained abscess on forearm  ??  #Left SI joint septic arthritis, Serratia Bacteremia  Patient had MRI of the pelvis on 9/14 at OSH showing left sided piriformis and gluteus maximus myositis and possible SI joint septic arthritis. Patient was admitted from 07/13/18 to 07/18/18 and was getting Vanc and cefepime. She had an TEE on 07/18/18 that did not show any signs of infective endocarditis. She left AMA after her TEE. She was given Linezolid to fill when she left AMA, but she never got it. She presents complaining of increased pain in her left hip worsened with moving her leg and trouble with walking secondary to her pain. She had MRI L spine on 07/13/18 at OSH that did not show any spine involvement of her infection. Ortho evaluated her and they do not anticipate any immediate surgical intervention, but they will follow her.  -cont cefipime for 4 weeks total  - 9/22 MRI L spine limited by pt leaving scanner, but spoke to neuro radiology resident (9/23), they feel that this study was sufficient to eliminate concern for discitis.   - 9/22 1 of 2 blood cultures grew gram  negative bacilli.   - 9/23 pelvic MRI shows left SI joint is septic, no fluid to drain. Ortho will not intervene at present time  - blood cultures initially positive for serratia (Patient used tap water to rinse needles. ID recommends 4 weeks cefipime), but most recently ngtd   - PICC placed on 07/27/18  ??  #T2DM  Patient has a history of type 2 diabetes, but she does not take any medications at home. Her A1c on admission is 6.6  - low dose sliding scale insulin regimen  ??  #IVDU  Patient uses Heroin and fentanyl and her last use was yesterday. HIV testing was negative at OSH.  -COWS  protocol for withdrawal started.  -9/24 started methadone 25 mg daily  ??  #Anemia  Hgb stable at ~10.5. Most likely iron deficiency anemia given pt demographic.  -iron studies negative for iron deficiency anemia  ??  #Hepatitis Viruses Labs  Her Hep A igM, Hep B core IgM, and Hep C ab were all reactive on 07/14/18 at OSH. Hepatic labs wnl on 9/23.  - 9/26 labs indicate that she has cleared all hepatitis infections    #Right forearm abscess  Discovered by pt on 9/24. We lanced it on 9/25. ACS incised/drained it at bedside on 9/26.        Nutrition:  Diet Orders          Diet regular starting at 09/24 1245          Code Status: Full Code    Signed:  Rohitha Moudgal  07/27/2018, 12:30 PM

## 2018-07-27 NOTE — Procedures (Signed)
Incision and Drainage Procedure Note  Indication: Abscess    Procedure: The patient was positioned appropriately and the skin over the incision site was prepped with chlorhexidine and draped in a sterile fashion. Local anesthesia was obtained by infiltration using 1% Lidocaine with epinephrine.  A 1cm incision was then made over the greatest area of fluctuance, and directly over line of prior I&D, and approximately 5-10 cc of thin purulent material was expressed. Loculations were broken up using a hemostat with scant additional material expressed. The drainage cavity was then packed with packing strips and sterile gauze.  The patient tolerated the procedure well.    Complications: None

## 2018-07-27 NOTE — H&P (Addendum)
General Surgery History and Physical/Consultation Note    Patient: Pat Reinertsen  MRN: [id number]  CSN: [id number]    Requesting Physician: Dr. Darrin Nipper    History     CC: R arm pain    HPI: Jullie Deshotels is a 37 y.o. female w/ PMH of IVDU, HBV, HCV, and DM who initially presented to [facility] on 9/22 with L leg pain. She originally left [facility] after being admitted for osteomyelitis/pyomyositis of gluteus maximus and piriformis (negative TTE, negative BC at OSH). She was given antibiotics when she left AMA but did not take them. She had lumbar back pain and some LLE weakness on presentation to [facility] MRI showed no spinal disease. The patient was evaluated by ortho at [facility] for L SI join infection. They did not believe operative management was appropriate and imaging revealed no drainable fluid collection. 9/22 blood cultures grew serratia. 9/24 cultures negative.  She was started on broad spectrum antibiotics (cefepime/vanc) on 9/22, narrowed to cefepime 9/24.     On 9/25 patient noted a painful nodule on the R arm, which was concerning for abscess. An I&D was performed by the medicine team at bedside yesterday. Culture was sent at that time.  Minimal serous drainage at time of I&D and then occasional purulent drainage overnight with showering.   Area continued to be painful with fluctuance. ACS was consulted for evaluation of this abscess.     Patient noted first noted change on her arm 2 days ago.  No injection or trauma to this site per her report.  Adjacent PIV has been infusing IVF.   Pain and fluctuance has worsened over past 24 hours.  Remains on antibiotics as above.      PMH:  Past Medical History:   Diagnosis Date    Abnormal Pap smear     Anxiety     Back pain     Bipolar disorder (CMS Dx)     Depression     Diabetes mellitus (CMS Dx)     Ectopic pregnancy     Hashimoto's thyroiditis     OCD (obsessive compulsive disorder)     Pituitary adenoma (CMS Dx)        PSH:  Past Surgical  History:   Procedure Laterality Date    CERVICAL FUSION      CESAREAN SECTION         Medications:  No current facility-administered medications on file prior to encounter.      No current outpatient prescriptions on file prior to encounter.       Allergies:  Latex, natural rubber    SH:  Social History     Social History    Marital status: Divorced     Spouse name: N/A    Number of children: N/A    Years of education: N/A     Occupational History    Not on file.     Social History Main Topics    Smoking status: Current Every Day Smoker     Packs/day: 0.50     Types: Cigarettes    Smokeless tobacco: Never Used    Alcohol use No    Drug use: Yes     Frequency: 1.0 time per week     Types: Heroin    Sexual activity: No     Other Topics Concern    Not on file     Social History Narrative    No narrative on file       FH:  Family History   Problem Relation Age of Onset    Cancer Maternal Grandfather     Cancer Paternal Grandfather     Hyperlipidemia Paternal Grandmother     Depression Mother     Depression Sister     Depression Brother        ROS:   Review of Systems   Constitutional: Positive for activity change. Negative for appetite change, chills, fatigue and fever.   HENT: Negative for congestion, sore throat and trouble swallowing.    Eyes: Negative for pain and redness.   Respiratory: Negative for cough, shortness of breath and wheezing.    Cardiovascular: Negative for chest pain and leg swelling.   Gastrointestinal: Negative for abdominal pain, constipation, diarrhea, nausea and vomiting.   Genitourinary: Negative for decreased urine volume and urgency.   Musculoskeletal: Positive for back pain.   Neurological: Negative for dizziness, light-headedness and headaches.   Psychiatric/Behavioral: Negative for confusion.       Vital Signs     Temp:  [97.8 F (36.6 C)-99 F (37.2 C)] 97.8 F (36.6 C)  Heart Rate:  [57-72] 61  Resp:  [16-20] 20  BP: (105-122)/(61-68) 111/66  Vitals:    07/27/18  0807   BP: 111/66   Pulse: 61   Resp: 20   Temp: 97.8 F (36.6 C)   SpO2: 100%       Date 07/26/18 0700 - 07/27/18 0659 07/27/18 0700 - 07/28/18 0659   Shift 0700-1459 1500-2259 2300-0659 24 Hour Total 0700-1459 1500-2259 2300-0659 24 Hour Total   I  N  T  A  K  E   P.O. 240 120  360          P.O. 240 120  360        Shift Total  (mL/kg) 240  (4.1) 120  (2)  360  (6.1)       O  U  T  P  U  T   Urine  (mL/kg/hr)              Urine Occurrence 2 x 1 x  3 x        Emesis/NG output              Emesis Occurrence 0 x   0 x        Stool              Stool Occurrence 0 x   0 x        Shift Total  (mL/kg)           Weight (kg) 59 59 59 59 59 59 59 59       Physical Exam     Physical Exam   Constitutional: She appears well-developed and well-nourished. No distress.   HENT:   Head: Normocephalic and atraumatic.   Right Ear: External ear normal.   Left Ear: External ear normal.   Nose: Nose normal.   Eyes: Conjunctivae and EOM are normal. Right eye exhibits no discharge. Left eye exhibits no discharge. No scleral icterus.   Neck: Normal range of motion. Neck supple.   Cardiovascular: Normal rate, regular rhythm and intact distal pulses.    Pulmonary/Chest: Effort normal. No respiratory distress. She exhibits no tenderness.   Abdominal: Soft. She exhibits no distension. There is no tenderness.   Musculoskeletal:        Arms:  Neurological:   Numbness of the arm in the area of the edema between the mid forearm  and elbow.    Skin: Skin is warm and dry. She is not diaphoretic.   Psychiatric: She has a normal mood and affect. Her behavior is normal.               Laboratory Data           Invalid input(s): CO2ART, HBO2PE      Lab 07/27/18  0333 07/26/18  1055 07/25/18  0501 07/24/18  0437   WBC 5.8 5.1 5.5 6.7   HEMOGLOBIN 10.5* 10.6* 10.3* 11.1*   HEMATOCRIT 31.9* 32.4* 30.7* 33.2*   MEAN CORPUSCULAR VOLUME 82.6 82.5 81.5 81.4   PLATELETS 287 281 258 251         Lab 07/27/18  0333 07/26/18  1055 07/25/18  0501 07/24/18  0437    SODIUM 140 138 142 138   POTASSIUM 4.1 3.8 3.8 3.5   CHLORIDE 103 102 106 102   CO2 28 26 27 26    BUN 13 10 10 12    CREATININE 0.48* 0.58* 0.49* 0.52*   GLUCOSE 94 149* 119* 111*   CALCIUM 9.4 9.6 9.2 9.6   MAGNESIUM 2.0  --  1.9  --    PHOSPHORUS 4.5 3.9 3.9 4.2         Lab 07/24/18  0437   INR 1.1   PROTHROMBIN TIME 14.7         Lab 07/27/18  0333 07/26/18  1055 07/25/18  0501 07/24/18  0437   ALT  --   --   --  10   AST  --   --   --  20   ALK PHOS  --   --   --  63   BILIRUBIN TOTAL  --   --   --  0.4   BILIRUBIN DIRECT  --   --   --  0.09   ALBUMIN 3.5 3.8 3.4* 3.6  3.6           Invalid input(s): KEYTONESU        Imaging Studies     No results found.    Assessment and Plan   Tamy Danjou is a 37 y.o. female  PMH of IVDU, HBV, HCV, and DM who is admitted for L SI joint osteomylitis, pyomyositis, and R arm abscess s/p bedside I&D by primary medicine team on 9/25. Abscess is centrally fluctuant and central incision from I&D yesterday appears to have fully sealed with reaccumulation.    Right dorsal FA Abscess  - Peripheral IV adjacent to abscess and removed prior to I&D  - 3cm fluctuant abscess, prior I&D by primary team fully healed in less than 24 hours with re- accumulation.  - Area prepped and I&D performed (see additional procedure note).    - Packed with 1/4 inch iodoform packing with overlying gauze.  - ACS will see in the AM to change packing.     If stable, nursing will need to change packing BID after that time.  - Primary team to follow-up cultures  - No surrounding cellulitis, continue antibiotics per primary team.    Amber S. Edwena Bunde, CNP  Department of Surgery  Division of Trauma, Surgical Critical Care, and Acute Care Surgery  Academic Office:  [phone]  Acute Care Surgery Pager:  [phone]        ACS ATTENDING - Addendum  This patient was seen by the CNP/resident ACS team. I have personally seen this patient on 9/26, examined this patient, and my assessment reveals:  Is this  patient an ACS admission/consult? Yes and being treated for Abscess    Patient with IV drug abuse and admitted for osteo who has on exam a superficial soft tissue abscess    Tender on exam - fluctuant in nature    See procedure note - will plan bedside I and D     Paulette Blanch, MD  Attending Surgeon  Division of Trauma, Surgical Critical Care, and Acute Care Surgery

## 2018-07-27 NOTE — Unmapped (Signed)
Problem: Fall Prevention  Goal: Patient will remain free of falls  Assess and monitor vitals signs, neurological status including level of consciousness and orientation.  Reassess fall risk per hospital policy.    Ensure arm band on, uncluttered walking paths in room, adequate room lighting, call light and overbed table within reach, bed in low position, wheels locked, side rails up per policy (excluding SNF), and non-skid footwear provided.    Outcome: Progressing  Pt is A&O x 4. Vitals stable.   Pt's morse fall risk score is 60.  This RN lowered in lowest bed and call light within reach.  Pt is in bed.     Catalina Pizza, RN

## 2018-07-27 NOTE — Unmapped (Signed)
Anne Goodwin is a 37 y.o. female patient.  1. Leg pain    2. Infective myositis of left lower extremity    3. At risk for inadequate pain control    4. Serratia sepsis (CMS Dx)    5. Opioid use disorder, severe, dependence (CMS Dx)    6. Stimulant use disorder    7. Arm abscess      Past Medical History:   Diagnosis Date   ??? Abnormal Pap smear    ??? Anxiety    ??? Back pain    ??? Bipolar disorder (CMS Dx)    ??? Depression    ??? Diabetes mellitus (CMS Dx)    ??? Ectopic pregnancy    ??? Hashimoto's thyroiditis    ??? OCD (obsessive compulsive disorder)    ??? Pituitary adenoma (CMS Dx)      Blood pressure 111/66, pulse 61, temperature 97.8 ??F (36.6 ??C), temperature source Oral, resp. rate 20, height 5' 5 (1.651 m), weight 130 lb (59 kg), SpO2 100 %, unknown if currently breastfeeding.       Insert PICC line  Date/Time: 07/27/2018 12:58 PM  Performed by: Lelon Frohlich  Authorized by: Shirlee Latch MARIE     Universal Protocol:     Verbal consent obtained?: Yes      Written consent obtained?: Yes      Risks and benefits: Risks, benefits and alternatives were discussed      Consent given by:  Patient    Patient states understanding of procedure being performed: Yes      Patient's understanding of procedure matches consent: Yes      Procedure consent matches procedure scheduled: Yes      Relevant documents present and verified: Yes      Test results available and properly labeled: Yes      Site marked: Yes      Imaging studies available: Yes      Required items: Required blood products, implants, devices and special equipment available      Patient identity confirmed:  Verbally with patient, arm band and hospital-assigned identification number    Time out: Immediately prior to the procedure a time out was called    A time out verifies correct patient, procedure, equipment, support staff and site/side marked as required:   Preparation:     Preparation: Patient was prepped and draped in usual sterile fashion    Site:       brachial vein    Local anesthesia used?: Yes      Anesthesia:  Local infiltration    Local anesthetic:  Lidocaine 1% without epinephrine    Anesthetic total (ml):  5    Patient sedated: No    Post-procedure:     Patient tolerance:  Patient tolerated the procedure well with no immediate complications     PICC placement completed, patient/family educated on complications, risks and prevention of infection. Written material given to patient/family.        Daphene Chisholm  07/27/2018

## 2018-07-27 NOTE — Unmapped (Signed)
Infectious Diseases Consult follow-up    Patient Name:  Anne Goodwin   Admit Date:  07/23/2018 Hospital Day: 5  AUTHOR: MICHELLE [facility] 07/27/2018 12:00 PM      Pager [phone]  Reason for consult: Gram negative osteomyelitis/bacteremia    Subjective:  This AM, patient reports feeling better. Ongoing pain issues with management with primary team.    ROS:  Negative for CP, SOB  Negative for vomiting, positive for nausea  Negative for dysuria, hematuria.    Physical Exam:  BP 111/66 (BP Location: Left arm, Patient Position: Sitting)    Pulse 61    Temp 97.8 ??F (36.6 ??C) (Oral)    Resp 20    Ht 5' 5 (1.651 m)    Wt 130 lb (59 kg)    SpO2 100%    BMI 21.63 kg/m??   Gen: thin WF, appears stated age, thin, laying in bed in mild discomfort  Cardio: nl S1/S2, RRR, no r/m/g  Lungs: CTAB, nl WOB  Abd: soft, nontender, BS+  Ext: no clubbing, cyanosis, edema; no nail changes  MSK; 5/5 strength in interossei, thumb opposition.  Skin: no noted rashes or lesions.    Lines: PIV x2    Labs:  Recent Labs      07/25/18   0501  07/26/18   1055  07/27/18   0333   WBC  5.5  5.1  5.8   HGB  10.3*  10.6*  10.5*   HCT  30.7*  32.4*  31.9*   PLT  258  281  287     Recent Labs      07/25/18   0501  07/26/18   1055  07/27/18   0333   NA  142  138  140   K  3.8  3.8  4.1   CL  106  102  103   CO2  27  26  28    BUN  10  10  13    CREATININE  0.49*  0.58*  0.48*   GLUCOSE  119*  149*  94     Lab Results   Component Value Date    ALKPHOS 63 07/24/2018    ALT 10 07/24/2018    AST 20 07/24/2018    BILITOT 0.4 07/24/2018    ALBUMIN 3.5 07/27/2018    BILIDIRECT 0.09 07/24/2018    PROT 7.6 07/24/2018     Lab Results   Component Value Date    CKTOTAL 249 (H) 07/23/2018        Microbiology:  07/23/18 Positive blood cx x1 for Serratia marcescens    Pertinent Imaging:  MRI Pelvis w and w/o contrast 07/24/18  IMPRESSION:  ??  Septic arthritis of the left sacroiliac joint without an associated drainable fluid collection.  ??  Approved by William Hamburger, MD  on 07/25/2018 9:04 AM EDT    All active medications have been reviewed.  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 Cefepime is the best treatment for this pathogen overall, even with sensitives. Patient appears to have been exposed to Hepatitis B and C previously but has cleared infection, so no follow up is warranted from this standpoint.  Plan:  - Please continue cefepime for 4 weeks total.  - Agree with PICC access, might need IP for administration of abx.      Please call with any additional questions  or concerns. We will sign off at this point. Thank you for the consult.  Discussed and staffed with Dr. Merilynn Finland.    MICHELLE [facility] 07/27/2018 12:00 PM      Pager [phone]

## 2018-07-28 LAB — RENAL FUNCTION PANEL W/EGFR
Albumin: 3.7 g/dL (ref 3.5–5.7)
Anion Gap: 6 mmol/L (ref 3–16)
BUN: 12 mg/dL (ref 7–25)
CO2: 31 mmol/L (ref 21–33)
Calcium: 9.3 mg/dL (ref 8.6–10.3)
Chloride: 100 mmol/L (ref 98–110)
Creatinine: 0.58 mg/dL (ref 0.60–1.30)
Glucose: 110 mg/dL (ref 70–100)
Osmolality, Calculated: 284 mOsm/kg (ref 278–305)
Phosphorus: 4.1 mg/dL (ref 2.1–4.7)
Potassium: 4 mmol/L (ref 3.5–5.3)
Sodium: 137 mmol/L (ref 133–146)
eGFR AA CKD-EPI: >90 See note.
eGFR NONAA CKD-EPI: >90 See note.

## 2018-07-28 LAB — CBC
Hematocrit: 33.1 % — ABNORMAL LOW (ref 35.0–45.0)
Hemoglobin: 10.8 g/dL — ABNORMAL LOW (ref 11.7–15.5)
MCH: 27.2 pg (ref 27.0–33.0)
MCHC: 32.5 g/dL (ref 32.0–36.0)
MCV: 83.8 fL (ref 80.0–100.0)
MPV: 7.5 fL (ref 7.5–11.5)
Platelets: 270 10E3/uL (ref 140–400)
RBC: 3.95 10E6/uL (ref 3.80–5.10)
RDW: 15.1 % — ABNORMAL HIGH (ref 11.0–15.0)
WBC: 4.5 10E3/uL (ref 3.8–10.8)

## 2018-07-28 LAB — POC GLU MONITORING DEVICE
POC Glucose Monitoring Device: 92 mg/dL (ref 70–100)
POC Glucose Monitoring Device: 140 mg/dL (ref 70–100)

## 2018-07-28 LAB — MAGNESIUM: Magnesium: 2 mg/dL (ref 1.5–2.5)

## 2018-07-28 MED ORDER — doxycycline (MONODOX) capsule 100 mg
100 | Freq: Two times a day (BID) | ORAL | Status: AC
Start: 2018-07-28 — End: 2018-08-03
  Administered 2018-07-28 – 2018-08-04 (×14): 100 mg via ORAL

## 2018-07-28 MED FILL — TYLENOL 325 MG TABLET: 325 325 mg | ORAL | Qty: 3

## 2018-07-28 MED FILL — CYCLOBENZAPRINE 5 MG TABLET: 5 5 MG | ORAL | Qty: 1

## 2018-07-28 MED FILL — HEPARIN (PORCINE) 5,000 UNIT/ML INJECTION SOLUTION: 5000 5,000 unit/mL | INTRAMUSCULAR | Qty: 1

## 2018-07-28 MED FILL — CEFEPIME 2 GRAM SOLUTION FOR INJECTION: 2 2 gram | INTRAMUSCULAR | Qty: 1

## 2018-07-28 MED FILL — GABAPENTIN 300 MG CAPSULE: 300 300 MG | ORAL | Qty: 1

## 2018-07-28 MED FILL — DOXYCYCLINE MONOHYDRATE 100 MG CAPSULE: 100 100 MG | ORAL | Qty: 1

## 2018-07-28 MED FILL — OXYCODONE 5 MG TABLET: 5 5 MG | ORAL | Qty: 1

## 2018-07-28 MED FILL — OXYCODONE 5 MG TABLET: 5 5 MG | ORAL | Qty: 2

## 2018-07-28 MED FILL — SODIUM CHLORIDE 0.9 % INTRAVENOUS PIGGYBACK: 2.00 2.00 g | INTRAVENOUS | Qty: 100

## 2018-07-28 MED FILL — METHADONE 5 MG/5 ML ORAL SOLUTION: 5 5 mg/5 mL | ORAL | Qty: 25

## 2018-07-28 MED FILL — IBUPROFEN 400 MG TABLET: 400 400 MG | ORAL | Qty: 1

## 2018-07-28 MED FILL — POLYETHYLENE GLYCOL 3350 17 GRAM ORAL POWDER PACKET: 17 17 gram | ORAL | Qty: 1

## 2018-07-28 MED FILL — LIDODERM 5 % TOPICAL PATCH: 5 5 % | TOPICAL | Qty: 1

## 2018-07-28 MED FILL — SENNOSIDES 8.6 MG-DOCUSATE SODIUM 50 MG TABLET: 8.6-50 8.6-50 mg | ORAL | Qty: 1

## 2018-07-28 MED FILL — SENNOSIDES 8.6 MG-DOCUSATE SODIUM 50 MG TABLET: 8.6-50 8.6-50 mg | ORAL | Qty: 2

## 2018-07-28 MED FILL — MELATONIN 3 MG TABLET: 3 3 mg | ORAL | Qty: 2

## 2018-07-28 NOTE — Progress Notes (Signed)
Surgery progress note    Subjective  No acute events overnight   Pt endorses minimal drainage from forearm I&D site    Objective  Vitals  BP 108/55 (BP Location: Left arm, Patient Position: Sitting)   Pulse 70   Temp 98.3 F (36.8 C) (Oral)   Resp 20   Ht 5' 5 (1.651 m)   Wt 130 lb (59 kg)   SpO2 100%   BMI 21.63 kg/m     Intake/output    Intake/Output Summary (Last 24 hours) at 07/28/18 1002  Last data filed at 07/28/18 0600   Gross per 24 hour   Intake              700 ml   Output                0 ml   Net              700 ml       Physical Exam  Gen: NAD, A+Ox3  CV: RRR  Resp: nonlabored, no use of accessory muscles   Abd: soft, ND  Ext: R forearm abscess I&D site, packing removed, minimal serosanguinous fluid expressed, repacked and dressed     Assessment  Anne Goodwin is a 37 y.o. female  POD#1 s/p R forearm abscess I&D at bedside     R forearm abscess I&D site, packing removed, minimal serosanguinous fluid expressed, repacked and dressed     Plan  - BID packing changing per nursing, dressing prn for drainage   - Surgery signing off, please       Loura Back, MD  Surgery Resident    07/28/2018 10:02 AM

## 2018-07-28 NOTE — Unmapped (Signed)
Department of Internal Medicine  Daily Progress Note      Chief Complaint / Reason for Follow-Up     Anne Goodwin is a 37 y.o. female on hospital day 5. 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    Interval History / Subjective     No acute events overnight. PICC placed yesterday. EKG obtained today shows QTc within normal limits.  ACS saw pt this morning and repacked wound (right forearm abscess drained yesterday).      Review of Systems (Focused)     Denies fevers, chills, headache, lightheadedness, chest pain, shortness of breath, cough, abdominal pain, nausea, vomiting, constipation, diarrhea.   Endorses back pain (unchanged)      Medications     Scheduled Meds:  ??? acetaminophen  975 mg Oral 3 times per day   ??? ceFEPime (MAXIPIME) IV extended infusion  2 g Intravenous Q12H   ??? doxycycline  100 mg Oral 2 times per day   ??? gabapentin  300 mg Oral TID   ??? heparin  5,000 Units Subcutaneous 3 times per day   ??? insulin lispro  0-5 Units Subcutaneous TID AC   ??? lidocaine  1 patch Transdermal Q24H   ??? lidocaine-EPINEPHrine  20 mL Subcutaneous Once   ??? melatonin  6 mg Oral Nightly (2100)   ??? methadone  25 mg Oral Daily 0900   ??? polyethylene glycol  17 g Oral BID   ??? senna-docusate  2 tablet Oral BID     Continuous Infusions:  PRN Meds:  cloNIDine HCl, cyclobenzaprine, dextrose 10% in water **OR** dextrose 10% in water, glucose, hydrOXYzine HCl, ibuprofen, loperamide, naloxone, ondansetron, oxyCODONE **OR** oxyCODONE       Vital Signs     Temp:  [97.7 ??F (36.5 ??C)-98.7 ??F (37.1 ??C)] 98.1 ??F (36.7 ??C)  Heart Rate:  [62-70] 64  Resp:  [20] 20  BP: (102-132)/(55-82) 107/61    Intake/Output Summary (Last 24 hours) at 07/28/18 1323  Last data filed at 07/28/18 0900   Gross per 24 hour   Intake              820 ml   Output                0 ml   Net              820 ml         Physical Exam     Constitutional: She is oriented to person, place, and time. She appears well-developed??and  well-nourished. Not in distress.  Eyes: Conjunctivae??and EOM??are normal. No scleral icterus.   Neck: Trachea midline, No JVD??present.   Cardiovascular: Normal rate, regular rhythm, normal heart sounds??and intact distal pulses. ??  No murmur??heard.  Pulmonary/Chest: Effort normal??and breath sounds normal. No respiratory distress. She has no wheezes. She has no rales.   Musculoskeletal: no edema. SI tenderness to palpation, but able to ambulate with crutches. Right forearm has small wound packed with wick. Right arm has PICC line.   Neurological: She is alert??and oriented to person, place, and time.   Psychiatric: normal mood, normal affect    Laboratory Data         Lab 07/28/18  0623 07/27/18  0333 07/26/18  1055 07/25/18  0501   WBC 4.5 5.8 5.1 5.5   HEMOGLOBIN 10.8* 10.5* 10.6* 10.3*   HEMATOCRIT 33.1* 31.9* 32.4* 30.7*   MEAN CORPUSCULAR VOLUME 83.8 82.6 82.5 81.5   PLATELETS 270 287 281  258           Lab 07/28/18  0623 07/27/18  0333 07/26/18  1055 07/25/18  0501   SODIUM 137 140 138 142   POTASSIUM 4.0 4.1 3.8 3.8   CHLORIDE 100 103 102 106   CO2 31 28 26 27    BUN 12 13 10 10    CREATININE 0.58* 0.48* 0.58* 0.49*   GLUCOSE 110* 94 149* 119*           Lab 07/28/18  0623 07/27/18  0333 07/26/18  1055 07/25/18  0501   CALCIUM 9.3 9.4 9.6 9.2   MAGNESIUM 2.0 2.0  --  1.9   PHOSPHORUS 4.1 4.5 3.9 3.9           Lab 07/24/18  0437   INR 1.1   PROTHROMBIN TIME 14.7           Lab 07/28/18  0623 07/27/18  0333 07/26/18  1055 07/25/18  0501 07/24/18  0437   ALT  --   --   --   --  10   AST  --   --   --   --  20   ALK PHOS  --   --   --   --  63   BILIRUBIN TOTAL  --   --   --   --  0.4   BILIRUBIN DIRECT  --   --   --   --  0.09   ALBUMIN 3.7 3.5 3.8 3.4* 3.6   3.6             Invalid input(s): WBCCAST, GRANCAST        Lab 07/23/18  0959   CK TOTAL 249*       No results found for: NTPROBNP    Lab Results   Component Value Date    TSH 1.27 05/09/2007           Lab 07/28/18  1245 07/27/18  1254 07/27/18  0820 07/26/18  1647    POC GLU MONITORING DEVICE 92 89 94 103*           Diagnostic Studies     9/27 EKG shows no acute ischemic changes, QTc is 454 ms.      Assessment & Plan     Anne Goodwin is a 37 y.o. female on HD# 5 with Infective myositis.  The medical issues being addressed in today's encounter are as follows:    Principal Problem:    Infective myositis    Today's Plan  -will attempt to transfer pt to [facility]  -cont methadone  -cont IV cefipime. Will need long term (4 weeks total) abx due to bone involvement (left SI joint).  -per ACS, cont packing right forearm wound with wicks twice per day.  ??  #Left SI joint septic arthritis, Serratia Bacteremia  Patient had MRI of the pelvis on 9/14 at OSH showing left sided piriformis and gluteus maximus myositis and possible SI joint septic arthritis. Patient was admitted from 07/13/18 to 07/18/18 and was getting Vanc and cefepime. She had an TEE on 07/18/18 that did not show any signs of infective endocarditis. She left AMA after her TEE. She was given Linezolid to fill when she left AMA, but she never got it. She presents complaining of increased pain in her left hip worsened with moving her leg and trouble with walking secondary to her pain. She had MRI L spine on 07/13/18 at OSH that did not show any spine involvement  of her infection. Ortho evaluated her and they do not anticipate any immediate surgical intervention, but they will follow her.  -cont??cefipime for 4 weeks total  - 9/22 MRI L spine limited by pt leaving scanner, but spoke to neuro radiology resident (9/23), they feel that this study was sufficient to eliminate concern for discitis.   - 9/22 1 of 2 blood cultures grew gram negative bacilli.   - 9/23 pelvic MRI shows left SI joint is septic, no fluid to drain. Ortho will not intervene at present time  - blood cultures initially positive for serratia (Patient used tap water to rinse needles. ID recommends 4 weeks cefipime), but most recently ngtd   - PICC placed on  07/27/18  ??  #T2DM  Patient has a history of type 2 diabetes, but she does not take any medications at home. Her A1c on admission is 6.6  - low dose sliding scale insulin regimen  ??  #IVDU  Patient uses Heroin and fentanyl and her last use was the day prior to ED presentation. HIV testing was negative at OSH.  -COWS protocol for withdrawal started.  -9/24 started methadone 25 mg daily  -9/27 EKG shows QTc is 454 ms  ??  #Anemia  Hgb stable at ~10.5. Most likely iron deficiency anemia given pt demographic.  -iron studies negative for iron deficiency anemia  ??  #Hepatitis Viruses Labs  Her Hep A igM, Hep B core IgM, and Hep C ab were all reactive on 07/14/18 at OSH. Hepatic labs wnl on 9/23.  - 9/26 labs indicate that she has cleared all hepatitis infections  ??  #Right forearm abscess  Discovered by pt on 9/24. We lanced it on 9/25. ACS incised/drained it at bedside on 9/26.  ??    Nutrition:  Diet Orders          Diet regular starting at 09/24 1245          Code Status: Full Code    Signed:  Rohitha Moudgal  07/28/2018, 1:23 PM

## 2018-07-28 NOTE — Unmapped (Signed)
Patient is medically ready and stable for discharge. Patient's belonging's are packed and leaving with the patient. IV access (R single lumen PICC) will remain, as patient will need 4 weeks of ATB. RN went over transfer with patient and patient verbally acknowledged understanding of instructions. Report called to Kathlene November, RN on [address]. Patient to be picked up at 1830.

## 2018-07-28 NOTE — Unmapped (Signed)
Hospital Medicine [facility]    We were asked to evaluate Anhelica Campbell-Moxley for transfer to hospital medicine at [facility].    The patient is appropriate for transfer at this time.    Primary team to complete the following:    ?? Transfer med rec & transfer order (not a discharge!)  ?? Enter receiving department:  [facility]   ?? Level of care: med/surg  ?? Service: medicine  ?? Attending physician:  Grayling Congress, MD  ?? Notify your team's case manager or social worker to arrange transport   ?? Update and share a discharge summary note up to the current point of care  ?? Update and share any HHC/COC notes up to the current point of care  ?? List any follow up appointments in the follow up section of the discharge navigator  ?? On the day of confirmed transport: call report to receiving provider      Grayling Congress, MD   Department of Internal Medicine  Pager ID [phone]  3:45 PM, 07/28/2018

## 2018-07-28 NOTE — Unmapped (Signed)
Problem: Fall Prevention  Goal: Patient will remain free of falls  Assess and monitor vitals signs, neurological status including level of consciousness and orientation.  Reassess fall risk per hospital policy.    Ensure arm band on, uncluttered walking paths in room, adequate room lighting, call light and overbed table within reach, bed in low position, wheels locked, side rails up per policy (excluding SNF), and non-skid footwear provided.    Outcome: Progressing  Patient will remain free from falls, call light is within reach, side rails up, floors free from clutter, wheels locked, bed in lowest position, non-skid socks on, RN/PCA numbers on the board. Will continue to monitor.  Anne Goodwin Anne Goodwin

## 2018-07-28 NOTE — Nursing Note (Signed)
Pt arrived by medical transport to 4174, A/O x3, room air, no SOB.

## 2018-07-28 NOTE — Unmapped (Signed)
Nursing Overnight Progress Note    Significant Events During Shift  Pt complained of lower back pain, 8/10 with aching.  Oxycodone 10 mg given as PRN ordered.     Patient/Family Concerns  Evening/Overnight Visitors: No  Concerns: No    Assessment  Nursing time demands: moderate  IV access: adequate and functioning  Sitter requirements: No    Mental Status  Mental Status for the past 14 hrs:   Level of Consciousness Orientation Level Cognition   07/27/18 2100 Alert Oriented X4 Appropriate judgement;Appropriate safety awareness       Calls to Physician Team  No data found.      Medications  [id number] - Medications Not Given  (last 12 hrs)         ** SITE UNKNOWN **       Medication Name Action Time Action Reason Comments     heparin (porcine) injection 5,000 Units 07/28/18 0520 Not Given Patient/family refused      polyethylene glycol (MIRALAX) packet 17 g 07/27/18 2114 Not Given Patient/family refused                 Diet/Meals Consumed (past 24 hours)  Nutrition Assessment for the past 24 hrs:   Diet Type Feeding Percent Meals Eaten (%) Appetite Nutritional Supplement   07/27/18 0900 Regular Able to feed self 100 % Good No

## 2018-07-28 NOTE — Unmapped (Addendum)
Per team, pt is to transfer to [facility] today-Patient will transfer to [facility], room 4174. Report is [phone].    RN/CM sent transport request to [facility] per [employer] requesting 5:30 pm transport.---am awaiting response.    Per [facility], they will tranport pt at 6:30 PM    RN/CM advised pt of 6:30 pm transfer to [facility].    Gates Rigg, RN, Care Coordinator  [phone]

## 2018-07-28 NOTE — Unmapped (Signed)
Per team, patient w/bacteremia and infectious myositis will need LT IV ABX, and will need SNF placement, and may be a candidate for transfer to [facility] later today.    Gates Rigg, RN, Care Coordinator  [phone]

## 2018-07-28 NOTE — Progress Notes (Signed)
ACS service following peripherally. S/p bedside I+D right arm abscess. Follow up appointment has been added to discharge navigator. Please call Nurse clinician [phone] with questions or to reschedule.    Future Appointments  Date Time Provider Department Center   08/02/2018 12:30 PM [facility]         **Surgery follow up appointment with Acute Care Surgery is in the Outpatient Center, located on the 2nd floor    Margaretmary Lombard, RN  Acute Care Surgery Nurse Clinician  Phone:[phone]

## 2018-07-29 LAB — POC GLU MONITORING DEVICE
POC Glucose Monitoring Device: 86 mg/dL (ref 70–100)
POC Glucose Monitoring Device: 78 mg/dL (ref 70–100)
POC Glucose Monitoring Device: 122 mg/dL — ABNORMAL HIGH (ref 70–100)

## 2018-07-29 MED ORDER — methadone (DOLOPHINE) 5 mg/5 mL oral solution 30 mg
5 | Freq: Every day | ORAL | Status: AC
Start: 2018-07-29 — End: 2018-08-05
  Administered 2018-07-30 – 2018-08-04 (×6): 30 mg via ORAL

## 2018-07-29 MED ORDER — prazosin (MINIPRESS) capsule 1 mg
1 | Freq: Every evening | ORAL | Status: AC
Start: 2018-07-29 — End: 2018-08-14
  Administered 2018-07-30 – 2018-08-14 (×16): 1 mg via ORAL

## 2018-07-29 MED ORDER — escitalopram oxalate (LEXAPRO) tablet 5 mg
10 | Freq: Every day | ORAL | Status: AC
Start: 2018-07-29 — End: 2018-08-14
  Administered 2018-07-29 – 2018-08-14 (×17): 5 mg via ORAL

## 2018-07-29 MED ORDER — traZODone (DESYREL) half tablet 25 mg
50 | Freq: Every evening | ORAL | Status: AC
Start: 2018-07-29 — End: 2018-07-29

## 2018-07-29 MED ORDER — gabapentin (NEURONTIN) capsule 400 mg
400 | Freq: Three times a day (TID) | ORAL | Status: AC
Start: 2018-07-29 — End: 2018-08-05
  Administered 2018-07-29 – 2018-08-05 (×21): 400 mg via ORAL

## 2018-07-29 MED ORDER — traZODone (DESYREL) half tablet 25 mg
50 | Freq: Every evening | ORAL | Status: AC | PRN
Start: 2018-07-29 — End: 2018-08-14
  Administered 2018-07-30 – 2018-08-14 (×12): 25 mg via ORAL

## 2018-07-29 MED ORDER — methadone (DOLOPHINE) 5 mg/5 mL oral solution 5 mg
5 | Freq: Once | ORAL | Status: AC
Start: 2018-07-29 — End: 2018-07-29
  Administered 2018-07-29: 16:00:00 5 mg via ORAL

## 2018-07-29 MED FILL — HEPARIN (PORCINE) 5,000 UNIT/ML INJECTION SOLUTION: 5000 5,000 unit/mL | INTRAMUSCULAR | Qty: 1

## 2018-07-29 MED FILL — TRAZODONE 25 MG DOSE: 50 50 MG | ORAL | Qty: 1

## 2018-07-29 MED FILL — POLYETHYLENE GLYCOL 3350 17 GRAM ORAL POWDER PACKET: 17 17 gram | ORAL | Qty: 1

## 2018-07-29 MED FILL — TYLENOL 325 MG TABLET: 325 325 mg | ORAL | Qty: 3

## 2018-07-29 MED FILL — METHADONE 5 MG/5 ML ORAL SOLUTION: 5 5 mg/5 mL | ORAL | Qty: 5

## 2018-07-29 MED FILL — OXYCODONE 5 MG TABLET: 5 5 MG | ORAL | Qty: 2

## 2018-07-29 MED FILL — SENNOSIDES 8.6 MG-DOCUSATE SODIUM 50 MG TABLET: 8.6-50 8.6-50 mg | ORAL | Qty: 2

## 2018-07-29 MED FILL — PRAZOSIN 1 MG CAPSULE: 1 1 MG | ORAL | Qty: 1

## 2018-07-29 MED FILL — CEFEPIME 2 GRAM SOLUTION FOR INJECTION: 2 2 gram | INTRAMUSCULAR | Qty: 1

## 2018-07-29 MED FILL — ESCITALOPRAM 10 MG TABLET: 10 10 MG | ORAL | Qty: 1

## 2018-07-29 MED FILL — DOXYCYCLINE MONOHYDRATE 100 MG CAPSULE: 100 100 MG | ORAL | Qty: 1

## 2018-07-29 MED FILL — LIDODERM 5 % TOPICAL PATCH: 5 5 % | TOPICAL | Qty: 1

## 2018-07-29 MED FILL — GABAPENTIN 400 MG CAPSULE: 400 400 MG | ORAL | Qty: 1

## 2018-07-29 MED FILL — IBUPROFEN 400 MG TABLET: 400 400 MG | ORAL | Qty: 1

## 2018-07-29 MED FILL — CYCLOBENZAPRINE 5 MG TABLET: 5 5 MG | ORAL | Qty: 1

## 2018-07-29 MED FILL — SODIUM CHLORIDE 0.9 % INTRAVENOUS PIGGYBACK: 2.00 2.00 g | INTRAVENOUS | Qty: 100

## 2018-07-29 MED FILL — METHADONE 5 MG/5 ML ORAL SOLUTION: 5 5 mg/5 mL | ORAL | Qty: 30

## 2018-07-29 MED FILL — GABAPENTIN 300 MG CAPSULE: 300 300 MG | ORAL | Qty: 1

## 2018-07-29 MED FILL — MELATONIN 3 MG TABLET: 3 3 mg | ORAL | Qty: 2

## 2018-07-29 NOTE — Unmapped (Signed)
Problem: Fall Prevention  Goal: Patient will remain free of falls  Assess and monitor vitals signs, neurological status including level of consciousness and orientation.  Reassess fall risk per hospital policy.    Ensure arm band on, uncluttered walking paths in room, adequate room lighting, call light and overbed table within reach, bed in low position, wheels locked, side rails up per policy (excluding SNF), and non-skid footwear provided.    Outcome: Progressing  Pt continue to remain free of falls. Bed locked and in lowest position, over bed table and belongings within reach, and non-skid socks provided. Will continue to monitor.
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
## 2018-07-29 NOTE — Unmapped (Signed)
Problem: Fall Prevention  Goal: Patient will remain free of falls  Assess and monitor vitals signs, neurological status including level of consciousness and orientation.  Reassess fall risk per hospital policy.    Ensure arm band on, uncluttered walking paths in room, adequate room lighting, call light and overbed table within reach, bed in low position, wheels locked, side rails up per policy (excluding SNF), and non-skid footwear provided.    Outcome: Progressing  Pt is alert and oriented x4, fall prevention reviewed with pt, refused bed alarm, crutches within reached.  bed in low position, call light and over bed table  within reach, bed locked and non skid sock on. Patient is encouraged to use call light to call for help.

## 2018-07-29 NOTE — Plan of Care (Signed)
Problem: Fall Prevention  Goal: Patient will remain free of falls  Assess and monitor vitals signs, neurological status including level of consciousness and orientation.  Reassess fall risk per hospital policy.    Ensure arm band on, uncluttered walking paths in room, adequate room lighting, call light and overbed table within reach, bed in low position, wheels locked, side rails up per policy (excluding SNF), and non-skid footwear provided.    Outcome: Progressing  Pt is alert and oriented x4, fall prevention reviewed with, crutches within reached. bed in low position, call light and over bed table  within reach, bed locked and non skid sock on. Patient is encouraged to use call light to call for help.

## 2018-07-29 NOTE — Unmapped (Signed)
Hospital Medicine  Daily Progress Note      Chief Complaint / Reason for Follow-Up     Anne Goodwin is a 37 y.o. female on hospital day 6.  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      Interval History     Patient was transferred to the [facility] unit. No events overnight.     Ms Campbell-Moxley requested to speak with a mental health provider. She said she wants to be treatment for her mental health, has previously been told she has anxiety, depression, PTSD. She would like to be seen psychiatry, and is interested in starting medications. She is worried that if, after discharge, she feels depressed that she might start abusing substances again, and she really does not want to do that. She has met with Addiction [facility], and she is taking methadone now, thinks she does want to increase the methadone dose by 5 mg today as Dr. Carlyle Dolly said she might do after being on the previous dose for at least three days. She also wanted to know if the gabapentin dose could be increased.      Review of Systems (focused)     Denies chest pain  Denies shortness of breath    Medications     Scheduled Meds:  ??? acetaminophen  975 mg Oral 3 times per day   ??? ceFEPime (MAXIPIME) IV extended infusion  2 g Intravenous Q12H   ??? doxycycline  100 mg Oral 2 times per day   ??? gabapentin  300 mg Oral TID   ??? heparin  5,000 Units Subcutaneous 3 times per day   ??? insulin lispro  0-5 Units Subcutaneous TID AC   ??? lidocaine  1 patch Transdermal Q24H   ??? melatonin  6 mg Oral Nightly (2100)   ??? methadone  25 mg Oral Daily 0900   ??? polyethylene glycol  17 g Oral BID   ??? senna-docusate  2 tablet Oral BID     Continuous Infusions:  PRN Meds:cloNIDine HCl, cyclobenzaprine, dextrose 10% in water **OR** dextrose 10% in water, glucose, hydrOXYzine HCl, ibuprofen, loperamide, naloxone, ondansetron, oxyCODONE **OR** oxyCODONE      Vital Signs     Temp:  [97.6 ??F (36.4 ??C)-98.3 ??F (36.8 ??C)] 97.6 ??F (36.4 ??C)  Heart Rate:   [63-71] 65  Resp:  [16-20] 16  BP: (107-153)/(55-93) 136/91    Intake/Output Summary (Last 24 hours) at 07/29/18 0831  Last data filed at 07/29/18 0500   Gross per 24 hour   Intake              600 ml   Output                0 ml   Net              600 ml         Physical Exam     Gen - alert, sitting up in bed in no acute distress  Eyes - normal conjunctiva, normal pupils  ENT - moist mucosa, normal color  Neck - supple, normal motion  CV - RRR, no MRG, no elevated JVP, no edema  Lung - clear to auscultation bilaterally, normal work of breathing  Abd - bowel sounds present in all quadrants, soft nontender, nondistended, no organomegaly  MSK -  no joint swelling, no muscle tenderness  Skin - normal temp, no rashes  Neuro - alert, oriented to self/place/time/situation, moves all extremities spontaneously  Psych - normal mood, normal behavior    Laboratory Data         Lab 07/28/18  0623 07/27/18  0333 07/26/18  1055 07/25/18  0501   WBC 4.5 5.8 5.1 5.5   HEMOGLOBIN 10.8* 10.5* 10.6* 10.3*   HEMATOCRIT 33.1* 31.9* 32.4* 30.7*   MEAN CORPUSCULAR VOLUME 83.8 82.6 82.5 81.5   PLATELETS 270 287 281 258           Lab 07/28/18  0623 07/27/18  0333 07/26/18  1055 07/25/18  0501   SODIUM 137 140 138 142   POTASSIUM 4.0 4.1 3.8 3.8   CHLORIDE 100 103 102 106   CO2 31 28 26 27    BUN 12 13 10 10    CREATININE 0.58* 0.48* 0.58* 0.49*   GLUCOSE 110* 94 149* 119*           Lab 07/28/18  0623 07/27/18  0333 07/26/18  1055 07/25/18  0501   CALCIUM 9.3 9.4 9.6 9.2   MAGNESIUM 2.0 2.0  --  1.9   PHOSPHORUS 4.1 4.5 3.9 3.9           Lab 07/28/18  1729 07/28/18  1245 07/27/18  1254 07/27/18  0820   POC GLU MONITORING DEVICE 140* 92 89 94       Diagnostic Studies     None new    Assessment & Plan     Anne Goodwin is a 37 y.o. female on hospital day 6.  The medical issues being addressed in today's encounter are as follows:    Principal Problem:    Infective myositis      Infective myositis  Left sacro-ileal joint  osteomyelitis  Serratia marcescens bacteremia  Patient will need 4 weeks of IV antibiotics per Infectious Disease  - Continue cefepime. Tentative end date is 08/22/2018  - Safety labs every Monday: ESR, CRP, complete metabolic panel, CBC with differential  - Pain management:   - Scheduled acetaminophen   - Lidocaine patch   - Continue oxycodone 10 mg every 4 hours as needed   - Senna-docusate two tablets twice daily and miralax twice daily for constipation prophylaxis   - Increase gabapentin from 300 mg TID to 400 mg TID    Right forearm abscess  Drained by ACS 07/27/2018. No culture sent  - Continue doxycycline for a total of 7 days    Opioid use disorder  Injects heroin and fentanyl. Has met with Addiction Services  - Increase methadone by 5 mg to 30 mg daily    Anxiety/depression  Patient interested in meeting with psychiatry  - Consulted psychiatry  - Continue hydroxyzine as needed for anxiety  - Start escitalopram per psychiatry  - Psychiatry planning on cognitive behavioral therapy sessions    Insomnia  PTSD  Patient having difficulty sleeping  - Continue melatonin  - Start prazosin 1 mg at bedtime for PTSD nightmares per psychiatry  - Start trazodone 25 mg at bedtime as needed for sleep    Type 2 diabetes  HbA1c 6.6%  - Has been on regular diet  - Low-dose corrective bolus protocol    Normocytic anemia  May be anemia of chronic inflammation given osteomyelitis/infection  - Treat underlying cause  - Follow up outpatient      Diet:   Diet Orders          Diet regular starting at 09/24 1245        Code Status: Full Code    Total time spent  with patient was 45 minutes.  Greater than 50% of that time was spent counseling and coordinating the care of the patient including discussion about plan of care and medications with patient at bedside, discussion with patient regarding mental services interest, discussion of case and coordination with the psychiatry consult team three times today, coordination with nursing and  team care coordinator.      Grayling Congress, MD  Department of Internal Medicine  Pager ID [phone])  8:31 AM, 07/29/2018

## 2018-07-29 NOTE — Unmapped (Signed)
Nursing Day Shift Progress Note    Significant Events During Shift  none    Patient/Family Concerns  Visitors: none  Concerns: none    Assessment  Nursing time demands: low  IV access: has IV access, adequate and functioning  Sitter requirements:  no    Mental Status  Mental Status for the past 14 hrs:   Level of Consciousness Orientation Level Cognition   07/29/18 0715 Alert Oriented X4 Ability to abstract;Appropriate judgement;Appropriate safety awareness;Follows 2-step commands       Medications  [phone] - Medications Not Given  (last 12 hrs)         ** SITE UNKNOWN **       Medication Name Action Time Action Reason Comments     acetaminophen (TYLENOL) tablet 975 mg 07/29/18 1254 Not Given Patient/family refused      heparin (porcine) injection 5,000 Units 07/29/18 1255 Not Given Patient/family refused      insulin lispro (humaLOG) injection 0-5 Units 07/29/18 0900 Not Given Order parameters not met      insulin lispro (humaLOG) injection 0-5 Units 07/29/18 1256 Not Given Order parameters not met      polyethylene glycol (MIRALAX) packet 17 g 07/29/18 0843 Not Given Patient/family refused

## 2018-07-30 LAB — POC GLU MONITORING DEVICE
POC Glucose Monitoring Device: 139 mg/dL — ABNORMAL HIGH (ref 70–100)
POC Glucose Monitoring Device: 113 mg/dL (ref 70–100)
POC Glucose Monitoring Device: 96 mg/dL (ref 70–100)

## 2018-07-30 MED ORDER — polyethylene glycol (MIRALAX) packet 17 g
17 | Freq: Two times a day (BID) | ORAL | Status: AC | PRN
Start: 2018-07-30 — End: 2018-08-14
  Administered 2018-07-31 – 2018-08-12 (×4): 17 g via ORAL

## 2018-07-30 MED FILL — TYLENOL 325 MG TABLET: 325 325 mg | ORAL | Qty: 3

## 2018-07-30 MED FILL — METHADONE 5 MG/5 ML ORAL SOLUTION: 5 5 mg/5 mL | ORAL | Qty: 30

## 2018-07-30 MED FILL — CEFEPIME 2 GRAM SOLUTION FOR INJECTION: 2 2 gram | INTRAMUSCULAR | Qty: 1

## 2018-07-30 MED FILL — DOXYCYCLINE MONOHYDRATE 100 MG CAPSULE: 100 100 MG | ORAL | Qty: 1

## 2018-07-30 MED FILL — HEPARIN (PORCINE) 5,000 UNIT/ML INJECTION SOLUTION: 5000 5,000 unit/mL | INTRAMUSCULAR | Qty: 1

## 2018-07-30 MED FILL — OXYCODONE 5 MG TABLET: 5 5 MG | ORAL | Qty: 2

## 2018-07-30 MED FILL — MELATONIN 3 MG TABLET: 3 3 mg | ORAL | Qty: 2

## 2018-07-30 MED FILL — GABAPENTIN 400 MG CAPSULE: 400 400 MG | ORAL | Qty: 1

## 2018-07-30 MED FILL — SODIUM CHLORIDE 0.9 % INTRAVENOUS PIGGYBACK: 2.00 2.00 g | INTRAVENOUS | Qty: 100

## 2018-07-30 MED FILL — PRAZOSIN 1 MG CAPSULE: 1 1 MG | ORAL | Qty: 1

## 2018-07-30 MED FILL — ESCITALOPRAM 10 MG TABLET: 10 10 MG | ORAL | Qty: 1

## 2018-07-30 MED FILL — CYCLOBENZAPRINE 5 MG TABLET: 5 5 MG | ORAL | Qty: 1

## 2018-07-30 MED FILL — LIDODERM 5 % TOPICAL PATCH: 5 5 % | TOPICAL | Qty: 1

## 2018-07-30 MED FILL — TRAZODONE 25 MG DOSE: 50 50 MG | ORAL | Qty: 1

## 2018-07-30 MED FILL — SENNOSIDES 8.6 MG-DOCUSATE SODIUM 50 MG TABLET: 8.6-50 8.6-50 mg | ORAL | Qty: 2

## 2018-07-30 MED FILL — POLYETHYLENE GLYCOL 3350 17 GRAM ORAL POWDER PACKET: 17 17 gram | ORAL | Qty: 1

## 2018-07-30 NOTE — Unmapped (Signed)
Problem: Fall Prevention  Goal: Patient will remain free of falls  Assess and monitor vitals signs, neurological status including level of consciousness and orientation.  Reassess fall risk per hospital policy.    Ensure arm band on, uncluttered walking paths in room, adequate room lighting, call light and overbed table within reach, bed in low position, wheels locked, side rails up per policy (excluding SNF), and non-skid footwear provided.    Outcome: Progressing  Pt continue to remain free of falls. Bed locked and in lowest position, over bed table and belongings within reach, and non-skid socks provided. Will continue to monitor.

## 2018-07-30 NOTE — Unmapped (Signed)
Nursing Overnight Progress Note    Significant Events During Shift  Pt kept asking for pain med around the clock. reporting staff to wake her up for her pain med in order to keep up with medication times. All scheduled med given per [facility].    Patient/Family Concerns  Evening/Overnight Visitors: none  Concerns: pt is seeking for pain med even in her sleep, rates pain as 8 or 9. pain med  needs to be adjusted.    Assessment  Nursing time demands: high  IV access: has IV access, adequate and functioning  Sitter requirements:  no    Mental Status  Mental Status for the past 14 hrs:   Level of Consciousness Orientation Level Cognition   07/29/18 2100 Alert Oriented X4 Ability to abstract;Appropriate attention/concentration;Appropriate for developmental age;Follows simple commands       Calls to Physician Team  No data found.      Medications  [phone] - Medications Not Given  (last 12 hrs)         ** SITE UNKNOWN **       Medication Name Action Time Action Reason Comments     heparin (porcine) injection 5,000 Units 07/29/18 2122 Not Given Patient/family refused      polyethylene glycol (MIRALAX) packet 17 g 07/29/18 2123 Not Given Patient/family refused                 Diet/Meals Consumed (past 24 hours)  Nutrition Assessment for the past 24 hrs:   Diet Type Feeding   07/29/18 2100 Diabetic Able to feed self

## 2018-07-30 NOTE — Unmapped (Signed)
Nursing Day Shift Progress Note    Significant Events During Shift  none    Patient/Family Concerns  Visitors: none  Concerns: none    Assessment  Nursing time demands: moderate  IV access: has IV access, adequate and functioning  Sitter requirements:  no    Mental Status  Mental Status for the past 14 hrs:   Level of Consciousness Orientation Level Cognition   07/30/18 0715 Alert Oriented X4 Ability to abstract;Appropriate judgement;Appropriate for developmental age;Follows 2-step commands       Medications  [phone] - Medications Not Given  (last 12 hrs)         ** SITE UNKNOWN **       Medication Name Action Time Action Reason Comments     acetaminophen (TYLENOL) tablet 975 mg 07/30/18 1306 Not Given Patient/family refused      heparin (porcine) injection 5,000 Units 07/30/18 1306 Not Given Patient/family refused

## 2018-07-30 NOTE — Unmapped (Signed)
Problem: Fall Prevention  Goal: Patient will remain free of falls  Assess and monitor vitals signs, neurological status including level of consciousness and orientation.  Reassess fall risk per hospital policy.    Ensure arm band on, uncluttered walking paths in room, adequate room lighting, call light and overbed table within reach, bed in low position, wheels locked, side rails up per policy (excluding SNF), and non-skid footwear provided.    Outcome: Progressing  Pt is alert and oriented x4, fall prevention reviewed with pt, pt refused bed alarm. Crutches within reached. bed in low position, call light and over bed table  within reach, bed locked and non skid sock on. Patient is encouraged to use call light to call for help.

## 2018-07-30 NOTE — Unmapped (Addendum)
Hospital Medicine  Daily Progress Note      Chief Complaint / Reason for Follow-Up     Anne Goodwin is a 37 y.o. female on hospital day 7.  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      Interval History     No events overnight. Patient said she no longer felt constipated and wanted the Miralax to be PRN. She thanked me for listening to her yesterday and for involving the psychiatry team.    Review of Systems (focused)     Denies chest pain  Denies shortness of breath    Medications     Scheduled Meds:  ??? acetaminophen  975 mg Oral 3 times per day   ??? ceFEPime (MAXIPIME) IV extended infusion  2 g Intravenous Q12H   ??? doxycycline  100 mg Oral 2 times per day   ??? escitalopram oxalate  5 mg Oral Daily 0900   ??? gabapentin  400 mg Oral TID   ??? heparin  5,000 Units Subcutaneous 3 times per day   ??? insulin lispro  0-5 Units Subcutaneous TID AC   ??? lidocaine  1 patch Transdermal Q24H   ??? melatonin  6 mg Oral Nightly (2100)   ??? methadone  30 mg Oral Daily 0900   ??? polyethylene glycol  17 g Oral BID   ??? prazosin  1 mg Oral Nightly (2100)   ??? senna-docusate  2 tablet Oral BID     Continuous Infusions:  PRN Meds:cyclobenzaprine, dextrose 10% in water **OR** dextrose 10% in water, glucose, hydrOXYzine HCl, oxyCODONE **OR** oxyCODONE, traZODone      Vital Signs     Temp:  [98 ??F (36.7 ??C)-98.5 ??F (36.9 ??C)] 98.2 ??F (36.8 ??C)  Heart Rate:  [64-87] 86  Resp:  [16-18] 16  BP: (113-136)/(58-88) 135/88    Intake/Output Summary (Last 24 hours) at 07/30/18 0942  Last data filed at 07/30/18 0500   Gross per 24 hour   Intake              360 ml   Output                0 ml   Net              360 ml         Physical Exam     Gen - alert, sitting up in chair next to bed in no acute distress  Eyes - normal conjunctiva, normal pupils  ENT - moist mucosa, normal color  Neck - supple, normal motion  CV - RRR, no MRG, no elevated JVP, no edema  Lung - clear to auscultation bilaterally, normal work of  breathing  Abd - bowel sounds present in all quadrants, soft nontender, nondistended, no organomegaly  MSK -  no joint swelling, no muscle tenderness  Skin - normal temp, no rashes  Neuro - alert, oriented to self/place/time/situation, moves all extremities spontaneously  Psych - normal mood, normal behavior    Laboratory Data         Lab 07/28/18  0623 07/27/18  0333 07/26/18  1055 07/25/18  0501   WBC 4.5 5.8 5.1 5.5   HEMOGLOBIN 10.8* 10.5* 10.6* 10.3*   HEMATOCRIT 33.1* 31.9* 32.4* 30.7*   MEAN CORPUSCULAR VOLUME 83.8 82.6 82.5 81.5   PLATELETS 270 287 281 258           Lab 07/28/18  0623 07/27/18  0333 07/26/18  1055 07/25/18  0501   SODIUM 137 140 138 142   POTASSIUM 4.0 4.1 3.8 3.8   CHLORIDE 100 103 102 106   CO2 31 28 26 27    BUN 12 13 10 10    CREATININE 0.58* 0.48* 0.58* 0.49*   GLUCOSE 110* 94 149* 119*           Lab 07/28/18  0623 07/27/18  0333 07/26/18  1055 07/25/18  0501   CALCIUM 9.3 9.4 9.6 9.2   MAGNESIUM 2.0 2.0  --  1.9   PHOSPHORUS 4.1 4.5 3.9 3.9           Lab 07/30/18  0911 07/29/18  2207 07/29/18  1809 07/29/18  1237   POC GLU MONITORING DEVICE 113* 139* 122* 78       Diagnostic Studies     None new    Assessment & Plan     Anne Goodwin is a 37 y.o. female on hospital day 7.  The medical issues being addressed in today's encounter are as follows:    Principal Problem:    Infective myositis      Infective myositis  Left sacro-ileal joint osteomyelitis  Serratia marcescens bacteremia  Patient will need 4 weeks of IV antibiotics per Infectious Disease  - Continue cefepime. Tentative end date is 08/22/2018  - Safety labs every Monday: ESR, CRP, complete metabolic panel, CBC with differential  - Pain management:   - Scheduled acetaminophen   - Lidocaine patch   - Continue oxycodone 10 mg every 4 hours as needed   - Senna-docusate two tablets twice daily for constipation prophylaxis   - Miralaix BID as needed for constipation   - Continue gabapentin 400 mg TID    Right forearm  abscess  Drained by [facility] 07/27/2018. No culture sent  - Continue doxycycline for a total of 7 days    Opioid use disorder  Injects heroin and fentanyl. Has met with Addiction Services  - Continue methadone 30 mg daily    Anxiety/depression  Psychiatry involved.  - Continue hydroxyzine as needed for anxiety  - Continue escitalopram  - Psychiatry - cognitive behavioral therapy     Insomnia  PTSD  Patient having difficulty sleeping. Psychiatry involved.  - Continue melatonin  - Continue prazosin 1 mg at bedtime for PTSD nightmares  - Continue trazodone 25 mg at bedtime as needed for sleep    Type 2 diabetes  HbA1c 6.6%.  - Has been on regular diet and patient's glucose levels have all been at goal for her entire stay.   - Discontinued fingerstick checks. Discussed with patient, and she was ok with this. Can reassess if clinical risks of hyperglycemia change during stay.    Normocytic anemia  May be anemia of chronic inflammation given osteomyelitis/infection  - Treat underlying cause  - Follow up outpatient      Diet:   Diet Orders          Diet regular starting at 09/24 1245        Code Status: Full Code    This note was copied forward to help me keep track of all the important details of this patients admission and medical issues. However, I have reviewed it in it's entirety and made changes to reflect today's visit with the patient, my exam, my assessment/plan and my medical decision making.       Grayling Congress, MD  Department of Internal Medicine  Pager ID [phone])  9:42 AM, 07/30/2018

## 2018-07-31 LAB — CBC
Hematocrit: 33.3 % (ref 35.0–45.0)
Hemoglobin: 11.2 g/dL (ref 11.7–15.5)
MCH: 28.1 pg (ref 27.0–33.0)
MCHC: 33.6 g/dL (ref 32.0–36.0)
MCV: 83.7 fL (ref 80.0–100.0)
MPV: 8.2 fL (ref 7.5–11.5)
Platelets: 303 10*3/uL (ref 140–400)
RBC: 3.98 10*6/uL (ref 3.80–5.10)
RDW: 15 % (ref 11.0–15.0)
WBC: 4.9 10*3/uL (ref 3.8–10.8)

## 2018-07-31 LAB — COMPREHENSIVE METABOLIC PANEL
ALT: 9 U/L (ref 7–52)
AST: 14 U/L (ref 13–39)
Albumin: 4.2 g/dL (ref 3.5–5.7)
Alkaline Phosphatase: 69 U/L (ref 36–125)
Anion Gap: 10 mmol/L (ref 3–16)
BUN: 16 mg/dL (ref 7–25)
CO2: 31 mmol/L (ref 21–33)
Calcium: 10 mg/dL (ref 8.6–10.3)
Chloride: 97 mmol/L (ref 98–110)
Creatinine: 0.54 mg/dL (ref 0.60–1.30)
Glucose: 96 mg/dL (ref 70–100)
Osmolality, Calculated: 287 mOsm/kg (ref 278–305)
Potassium: 4.5 mmol/L (ref 3.5–5.3)
Sodium: 138 mmol/L (ref 133–146)
Total Bilirubin: 0.3 mg/dL (ref 0.0–1.5)
Total Protein: 8.1 g/dL (ref 6.4–8.9)
eGFR AA CKD-EPI: >90 See note.
eGFR NONAA CKD-EPI: >90 See note.

## 2018-07-31 LAB — DIFFERENTIAL
Basophils Absolute: 0 /uL (ref 0–200)
Basophils Relative: 0 % (ref 0.0–1.0)
Eosinophils Absolute: 0 /uL (ref 15–500)
Eosinophils Relative: 0 % (ref 0.0–8.0)
Lymphocytes Absolute: 2891 /uL (ref 850–3900)
Lymphocytes Relative: 59 % (ref 15.0–45.0)
Monocytes Absolute: 147 /uL (ref 200–950)
Monocytes Relative: 3 % (ref 0.0–12.0)
Neutrophils Absolute: 1862 /uL (ref 1500–7800)
Neutrophils Relative: 38 % (ref 40.0–80.0)
PLT Morphology: NORMAL

## 2018-07-31 LAB — SED RATE: Sed Rate: 27 mm/hr (ref 0–20)

## 2018-07-31 LAB — C-REACTIVE PROTEIN: CRP: 4.8 mg/L (ref 1.0–10.0)

## 2018-07-31 MED FILL — TYLENOL 325 MG TABLET: 325 325 mg | ORAL | Qty: 3

## 2018-07-31 MED FILL — DOXYCYCLINE MONOHYDRATE 100 MG CAPSULE: 100 100 MG | ORAL | Qty: 1

## 2018-07-31 MED FILL — OXYCODONE 5 MG TABLET: 5 5 MG | ORAL | Qty: 2

## 2018-07-31 MED FILL — CEFEPIME 2 GRAM SOLUTION FOR INJECTION: 2 2 gram | INTRAMUSCULAR | Qty: 1

## 2018-07-31 MED FILL — GABAPENTIN 400 MG CAPSULE: 400 400 MG | ORAL | Qty: 1

## 2018-07-31 MED FILL — SODIUM CHLORIDE 0.9 % INTRAVENOUS PIGGYBACK: 2.00 2.00 g | INTRAVENOUS | Qty: 100

## 2018-07-31 MED FILL — MELATONIN 3 MG TABLET: 3 3 mg | ORAL | Qty: 2

## 2018-07-31 MED FILL — HEPARIN (PORCINE) 5,000 UNIT/ML INJECTION SOLUTION: 5000 5,000 unit/mL | INTRAMUSCULAR | Qty: 1

## 2018-07-31 MED FILL — POLYETHYLENE GLYCOL 3350 17 GRAM ORAL POWDER PACKET: 17 17 gram | ORAL | Qty: 1

## 2018-07-31 MED FILL — CYCLOBENZAPRINE 5 MG TABLET: 5 5 MG | ORAL | Qty: 1

## 2018-07-31 MED FILL — ESCITALOPRAM 10 MG TABLET: 10 10 MG | ORAL | Qty: 1

## 2018-07-31 MED FILL — SENNOSIDES 8.6 MG-DOCUSATE SODIUM 50 MG TABLET: 8.6-50 8.6-50 mg | ORAL | Qty: 2

## 2018-07-31 MED FILL — TRAZODONE 25 MG DOSE: 50 50 MG | ORAL | Qty: 1

## 2018-07-31 MED FILL — PRAZOSIN 1 MG CAPSULE: 1 1 MG | ORAL | Qty: 1

## 2018-07-31 MED FILL — LIDODERM 5 % TOPICAL PATCH: 5 5 % | TOPICAL | Qty: 1

## 2018-07-31 MED FILL — METHADONE 5 MG/5 ML ORAL SOLUTION: 5 5 mg/5 mL | ORAL | Qty: 30

## 2018-07-31 NOTE — Unmapped (Signed)
Problem: Fall Prevention  Goal: Patient will remain free of falls  Assess and monitor vitals signs, neurological status including level of consciousness and orientation.  Reassess fall risk per hospital policy.    Ensure arm band on, uncluttered walking paths in room, adequate room lighting, call light and overbed table within reach, bed in low position, wheels locked, side rails up per policy (excluding SNF), and non-skid footwear provided.    Outcome: Progressing  Pt was free of falls during shift.  Call light within reach. Room free of clutter. Bed at lowest position and locked. Pt ambulates with crutches. Will continue to monitor

## 2018-07-31 NOTE — Unmapped (Signed)
RN CM rounded with team.  Patient will be on IV abx with end date on 10/22 for left SI osteo and myositis.    Patient working with addiction services and is taking methadone (currently 30 mg daily). She has also had a behavioral health consult.    SNF referrals sent.    Barriers to discharge:  Active IVDU, Methadone.    Shelly A. Leavy Cella, RN CM  [address] [place], RN Care Coordinator  [phone] (phone)  [phone] (fax)

## 2018-07-31 NOTE — Unmapped (Signed)
Hospital Medicine  Daily Progress Note      Chief Complaint / Reason for Follow-Up     Anne Goodwin is a 37 y.o. female on hospital day 8.  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      Interval History     No events overnight. She had no concerns for me today. She says she hopes to be able to wean off of the oxycodone as soon as possible, but still felt some significant pain when she tried skipping a dose yesterday.    Review of Systems (focused)     Denies chest pain  Denies shortness of breath    Medications     Scheduled Meds:  ??? acetaminophen  975 mg Oral 3 times per day   ??? ceFEPime (MAXIPIME) IV extended infusion  2 g Intravenous Q12H   ??? doxycycline  100 mg Oral 2 times per day   ??? escitalopram oxalate  5 mg Oral Daily 0900   ??? gabapentin  400 mg Oral TID   ??? heparin  5,000 Units Subcutaneous 3 times per day   ??? lidocaine  1 patch Transdermal Q24H   ??? melatonin  6 mg Oral Nightly (2100)   ??? methadone  30 mg Oral Daily 0900   ??? prazosin  1 mg Oral Nightly (2100)   ??? senna-docusate  2 tablet Oral BID     Continuous Infusions:  PRN Meds:cyclobenzaprine, hydrOXYzine HCl, oxyCODONE **OR** oxyCODONE, polyethylene glycol, traZODone      Vital Signs     Temp:  [97.5 ??F (36.4 ??C)-98.3 ??F (36.8 ??C)] 98.3 ??F (36.8 ??C)  Heart Rate:  [65-79] 65  Resp:  [14-18] 14  BP: (98-116)/(53-62) 98/62    Intake/Output Summary (Last 24 hours) at 07/31/18 0901  Last data filed at 07/31/18 0500   Gross per 24 hour   Intake              480 ml   Output                0 ml   Net              480 ml         Physical Exam     Gen - alert, sitting up in chair next to bed in no acute distress  Eyes - normal conjunctiva, normal pupils  ENT - moist mucosa, normal color  Neck - supple, normal motion  CV - RRR, no MRG, no elevated JVP, no edema  Lung - clear to auscultation bilaterally, normal work of breathing  Abd - bowel sounds present in all quadrants, soft nontender, nondistended, no organomegaly  MSK  -  no joint swelling, no muscle tenderness  Skin - normal temp, no rashes  Neuro - alert, oriented to self/place/time/situation, moves all extremities spontaneously  Psych - normal mood, normal behavior    Laboratory Data         Lab 07/31/18  0353 07/28/18  0623 07/27/18  0333 07/26/18  1055   WBC 4.9 4.5 5.8 5.1   HEMOGLOBIN 11.2* 10.8* 10.5* 10.6*   HEMATOCRIT 33.3* 33.1* 31.9* 32.4*   MEAN CORPUSCULAR VOLUME 83.7 83.8 82.6 82.5   PLATELETS 303 270 287 281           Lab 07/31/18  0353 07/28/18  0623 07/27/18  0333 07/26/18  1055   SODIUM 138 137 140 138   POTASSIUM 4.5 4.0 4.1 3.8   CHLORIDE 97*  100 103 102   CO2 31 31 28 26    BUN 16 12 13 10    CREATININE 0.54* 0.58* 0.48* 0.58*   GLUCOSE 96 110* 94 149*           Lab 07/31/18  0353 07/28/18  0623 07/27/18  0333 07/26/18  1055 07/25/18  0501   CALCIUM 10.0 9.3 9.4 9.6 9.2   MAGNESIUM  --  2.0 2.0  --  1.9   PHOSPHORUS  --  4.1 4.5 3.9 3.9           Lab 07/30/18  1238 07/30/18  0911 07/29/18  2207 07/29/18  1809   POC GLU MONITORING DEVICE 96 113* 139* 122*       Diagnostic Studies     None new    Assessment & Plan     Anne Goodwin is a 37 y.o. female on hospital day 8.  The medical issues being addressed in today's encounter are as follows:    Principal Problem:    Infective myositis      Infective myositis  Left sacro-ileal joint osteomyelitis  Serratia marcescens bacteremia  Patient will need 4 weeks of IV antibiotics per Infectious Disease  - Continue cefepime. Tentative end date is 08/22/2018  - Safety labs every Monday: ESR, CRP, complete metabolic panel, CBC with differential  - Pain management:   - Scheduled acetaminophen   - Lidocaine patch   - Continue oxycodone 10 mg every 4 hours as needed   - Senna-docusate two tablets twice daily for constipation prophylaxis   - Miralaix BID as needed for constipation   - Continue gabapentin 400 mg TID    Right forearm abscess  Drained by [facility] 07/27/2018. No culture sent  - Continue doxycycline for a total of 7  days    Opioid use disorder  Injects heroin and fentanyl. Has met with Addiction Services  - Continue methadone 30 mg daily    Anxiety/depression  Psychiatry involved.  - Continue hydroxyzine as needed for anxiety  - Continue escitalopram  - Psychiatry - cognitive behavioral therapy   - Checked EKG for new QTc measurement after starting escitalopram. QTc not significantly changed    Insomnia  PTSD  Patient having difficulty sleeping. Psychiatry involved.  - Continue melatonin  - Continue prazosin 1 mg at bedtime for PTSD nightmares  - Continue trazodone 25 mg at bedtime as needed for sleep    Type 2 diabetes  HbA1c 6.6%.  - Has been on regular diet and patient's glucose levels have all been at goal for her entire stay.   - Discontinued fingerstick checks. Discussed with patient, and she was ok with this. Can reassess if clinical risks of hyperglycemia change during stay.    Normocytic anemia  May be anemia of chronic inflammation given osteomyelitis/infection  - Treat underlying cause  - Follow up outpatient      Diet:   Diet Orders          Diet regular starting at 09/24 1245        Code Status: Full Code    This note was copied forward to help me keep track of all the important details of this patients admission and medical issues. However, I have reviewed it in it's entirety and made changes to reflect today's visit with the patient, my exam, my assessment/plan and my medical decision making.       Grayling Congress, MD  Department of Internal Medicine  Pager ID [phone])  9:01 AM,  07/31/2018

## 2018-07-31 NOTE — Nursing Note (Signed)
Nursing Day Shift Progress Note    Significant Events During Shift  None    Patient/Family Concerns  Visitors: None  Concerns: None    Assessment  Nursing time demands: Moderate  IV access: R PICC  Sitter requirements:  None    Mental Status  Mental Status for the past 14 hrs:   Level of Consciousness Orientation Level Cognition   07/31/18 0900 Alert Oriented X4 Ability to abstract       Medications  [phone] - Medications Not Given  (last 12 hrs)         ** No medications to display **

## 2018-07-31 NOTE — Unmapped (Signed)
INFECTIOUS DISEASE PROGRESS NOTE    07/31/2018  8:30 PM    Anne Goodwin is a 37 y.o. female patient. Hospital Day: 8     Chief Complaint/Reason for Follow-up: Back pain     Subjective:    Subjective   Complains of back pain 7/10   Tolerating IV antibiotics     Review of Systems   ROS negative except for above     Objective:  Vital signs:  Vitals:    07/31/18 1600   BP: 120/60   Pulse: 65   Resp: 12   Temp: 98.6 ??F (37 ??C)   SpO2: 98%     Temp last 24 hours:Temp (24hrs), Avg:98.4 ??F (36.9 ??C), Min:98 ??F (36.7 ??C), Max:98.8 ??F (37.1 ??C)    Scheduled Meds:  ??? acetaminophen  975 mg Oral 3 times per day   ??? ceFEPime (MAXIPIME) IV extended infusion  2 g Intravenous Q12H   ??? doxycycline  100 mg Oral 2 times per day   ??? escitalopram oxalate  5 mg Oral Daily 0900   ??? gabapentin  400 mg Oral TID   ??? heparin  5,000 Units Subcutaneous 3 times per day   ??? lidocaine  1 patch Transdermal Q24H   ??? melatonin  6 mg Oral Nightly (2100)   ??? methadone  30 mg Oral Daily 0900   ??? prazosin  1 mg Oral Nightly (2100)   ??? senna-docusate  2 tablet Oral BID     Continuous Infusions:  PRN Meds:cyclobenzaprine, hydrOXYzine HCl, oxyCODONE **OR** oxyCODONE, polyethylene glycol, traZODone    Intake/Output last 3 shifts:    Date 07/30/18 1500 - 07/31/18 0659 07/31/18 0700 - 08/01/18 0659   Shift 1500-2259 2300-0659 24 Hour Total 0700-1459 1500-2259 2300-0659 24 Hour Total   I  N  T  A  K  E   P.O.  240 480          P.O.  240 480        Shift Total  (mL/kg)  240  (4.1) 480  (8.1)       O  U  T  P  U  T   Urine  (mL/kg/hr)             Urine Occurrence  2 x 2 x        Shift Total  (mL/kg)          Weight (kg) 59 59 59 59 59 59 59       Physical Exam     Gen: thin WF, appears stated age, thin, laying in bed in mild discomfort  Cardio: nl S1/S2, RRR, no r/m/g  Lungs: CTAB, nl WOB  Abd: soft, nontender, BS+  Ext: no clubbing, cyanosis, edema; no nail changes  PICC line is clean     Labs:       Lab 07/31/18  0353   WBC 4.9   HEMOGLOBIN 11.2*    HEMATOCRIT 33.3*   MEAN CORPUSCULAR VOLUME 83.7   PLATELETS 303        Lab 07/31/18  0353 07/28/18  0623   SODIUM 138 137   POTASSIUM 4.5 4.0   CHLORIDE 97* 100   CO2 31 31   BUN 16 12   CREATININE 0.54* 0.58*   GLUCOSE 96 110*   CALCIUM 10.0 9.3   MAGNESIUM  --  2.0   PHOSPHORUS  --  4.1          Lab 07/31/18  0353   ALK  PHOS 69   AST 14   ALT 9   BILIRUBIN TOTAL 0.3             Assessment/Plan:  Principal Problem:    Infective myositis    37 y.o. female with a history of IVDU, DM who was admitted 07/23/2018 with hip pain.  She had previously been admitted to [facility] on 07/13/18 and had MRI of the pelvis which showed myositis in the left piriformis, gluteus maximus and left SI joint effusion.   Blood cultures on 9/12 were negative.  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 Hep B S Ag negative.  Had negative TEE on 07/18/18 - left AMA afterwards.  Used heroin on 9/21 - presented to UC on 9/22.  Ortho consulted for possible SI joint infection - recommended ir aspirate.  MRI of the pelvis is pending.  Blood culture 9/22 positive for Serratia marcescens.  ??  Feeling somewhat better today - pain in hip and back ongoing but more tolerable.    Rates health as 7/10   ??  1)  Myositis/ septic arthritis of the left sacroiliac joint without drainable fluid collection - presumably this is secondary to Serratia bacteremia.  ?? Continue cefepime  ?? Plan for at least 4 weeks therapy -   ?? stop date 08/22/18  ??  PICC care q weekly   ?? If discharged please order the following for outpatient antibiotic management:  ?? Please obtain the follow labs and fax to the [facility] at [phone].  ?? Every Monday: ESR, CRP, Complete Metabolic Panel, CBC with diff  ?? Please order routine PICC Care with weekly and PRN sterile dressing changes  ?? The phone number for the [facility] is [phone] for any questions.  ??   ??  2) Serratia bacteremia  ?? Continue cefepime  ??  blood cultures from 9/24 are ngtd; last positive cultures 9/22  ??  3)  Hepatitis - LFTs normal  ?? HCV RNA detectable but <15 - possibly consistent with clearing infection.  ?? Hep B DNA negative - check Hepatitis B surface Ab  ??  4) Right forearm abscess - drained per ACS - no cultures  ?? Follow clinically -  ?? Continue doxycyline   ??  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 HIV negative 9/22.  ADS consulted - on methadone    Discussed with Dr Okey Dupre     [facility] Jacinta Shoe, MD  07/31/2018   Team 3 ID attending   Cell [phone]

## 2018-07-31 NOTE — Unmapped (Signed)
Nursing Overnight Progress Note    Significant Events During Shift  Pt received all medication per MAR. Requested staff to wake pt up for PRN pain med, AM labs drawn.     Patient/Family Concerns  Evening/Overnight Visitors: none  Concerns: none    Assessment  Nursing time demands: moderate  IV access: has IV access, adequate and functioning  Sitter requirements:  no    Mental Status  Mental Status for the past 14 hrs:   Level of Consciousness Orientation Level Cognition   07/30/18 2100 Alert Oriented X4 Ability to abstract;Appropriate attention/concentration;Appropriate for developmental age;Follows simple commands       Calls to Physician Team  No data found.      Medications  [phone] - Medications Not Given  (last 12 hrs)         ** No medications to display **          Diet/Meals Consumed (past 24 hours)  Nutrition Assessment for the past 24 hrs:   Percent Meals Eaten (%)   07/30/18 1300 100 %
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
## 2018-08-01 MED FILL — DOXYCYCLINE MONOHYDRATE 100 MG CAPSULE: 100 100 MG | ORAL | Qty: 1

## 2018-08-01 MED FILL — SODIUM CHLORIDE 0.9 % INTRAVENOUS PIGGYBACK: 2.00 2.00 g | INTRAVENOUS | Qty: 100

## 2018-08-01 MED FILL — OXYCODONE 5 MG TABLET: 5 5 MG | ORAL | Qty: 2

## 2018-08-01 MED FILL — CYCLOBENZAPRINE 5 MG TABLET: 5 5 MG | ORAL | Qty: 1

## 2018-08-01 MED FILL — CEFEPIME 2 GRAM SOLUTION FOR INJECTION: 2 2 gram | INTRAMUSCULAR | Qty: 1

## 2018-08-01 MED FILL — GABAPENTIN 400 MG CAPSULE: 400 400 MG | ORAL | Qty: 1

## 2018-08-01 MED FILL — TYLENOL 325 MG TABLET: 325 325 mg | ORAL | Qty: 3

## 2018-08-01 MED FILL — METHADONE 5 MG/5 ML ORAL SOLUTION: 5 5 mg/5 mL | ORAL | Qty: 30

## 2018-08-01 MED FILL — SENNOSIDES 8.6 MG-DOCUSATE SODIUM 50 MG TABLET: 8.6-50 8.6-50 mg | ORAL | Qty: 2

## 2018-08-01 MED FILL — PRAZOSIN 1 MG CAPSULE: 1 1 MG | ORAL | Qty: 1

## 2018-08-01 MED FILL — ESCITALOPRAM 10 MG TABLET: 10 10 MG | ORAL | Qty: 1

## 2018-08-01 MED FILL — HEPARIN (PORCINE) 5,000 UNIT/ML INJECTION SOLUTION: 5000 5,000 unit/mL | INTRAMUSCULAR | Qty: 1

## 2018-08-01 MED FILL — TRAZODONE 25 MG DOSE: 50 50 MG | ORAL | Qty: 1

## 2018-08-01 MED FILL — LIDODERM 5 % TOPICAL PATCH: 5 5 % | TOPICAL | Qty: 1

## 2018-08-01 MED FILL — MELATONIN 3 MG TABLET: 3 3 mg | ORAL | Qty: 2

## 2018-08-01 NOTE — Progress Notes (Signed)
Nursing Day Shift Progress Note    Significant Events During Shift  Patient calm,did not ask for much except for pain meds    Patient/Family Concerns  Visitors: none  Concerns: pain control    Assessment  Nursing time demands: {minimal  IV access: right lumen picc  Sitter requirements:  none    Mental Status  Mental Status for the past 14 hrs:   Level of Consciousness Orientation Level Cognition   08/01/18 0900 Alert Oriented to person;Oriented to place Follows simple commands       Medications  [id number] - Medications Not Given  (last 12 hrs)         ** SITE UNKNOWN **       Medication Name Action Time Action Reason Comments     heparin (porcine) injection 5,000 Units 08/01/18 0649 Not Given Patient/family refused

## 2018-08-01 NOTE — Unmapped (Signed)
[facility] can accept patient.  RN CM will discuss with patient. [facility] will work with [facility] for methadone.      Shelly A. Leavy Cella, RN CM  [address], RN Care Coordinator  [phone] (phone)  [phone] (fax)

## 2018-08-01 NOTE — Unmapped (Signed)
[facility] ACUTE CARE SURG[facility] PROGRESS NOTE  Admit Date: 07/23/2018  OR Date:       Subjective:   Continues on IV antibiotics per medicine team  No leukocytosis and afebrile.  Patient reports pain has nearly resolved to abscess site.  She is changing packing with assistance of RN on daily basis.    Objective:   Vitals:  Temp:  [97.6 ??F (36.4 ??C)-98.6 ??F (37 ??C)] 98.1 ??F (36.7 ??C)  Heart Rate:  [60-84] 84  Resp:  [12-18] 16  BP: (97-120)/(53-69) 117/69  Vitals:    08/01/18 1200   BP: 117/69   Pulse: 84   Resp: 16   Temp: 98.1 ??F (36.7 ??C)   SpO2: 98%         Date 07/31/18 0700 - 08/01/18 0659 08/01/18 0700 - 08/02/18 0659   Shift 0700-1459 1500-2259 2300-0659 24 Hour Total 0700-1459 1500-2259 2300-0659 24 Hour Total   I  N  T  A  K  E   P.O.     240   240      P.O.     240   240    Shift Total  (mL/kg)     240  (4.1)   240  (4.1)   O  U  T  P  U  T   Urine  (mL/kg/hr)              Urine Occurrence     2 x   2 x    Shift Total  (mL/kg)           Weight (kg) 59 59 59 59 59 59 59 59       Physical Exam:  Gen: Alert and oriented x3, no acute distress  HEENT: NCAT, PERRL  CV: Regular rate and rhythm  Resp: Even and easy  Abd: Soft, non-distended, non-tender  Ext: Warm and well perfused  Wound: R FA prior abscess with small opening from prior incision, mild induration immediately surrounding incision site, no further induration on extremity, no erythema or fluctuance.  No drainage expressed, mildly tender    Recent Labs      07/31/18   0353   WBC  4.9   HGB  11.2*   HCT  33.3*   MCV  83.7   PLT  303     Recent Labs      07/31/18   0353   NA  138   K  4.5   CL  97*   CO2  31   BUN  16   CREATININE  0.54*   CALCIUM  10.0     Recent Labs      07/29/18   1809  07/29/18   2207  07/30/18   0911  07/30/18   1238   POCGMD  122*  139*  113*  96     Recent Labs      07/31/18   0353   BILITOT  0.3   AST  14   ALT  9   ALKPHOS  69     No results for input(s): INR, PROTIME in the last 72 hours.    Current Medications:  Scheduled  Medications:    acetaminophen 975 mg 3 times per day   ceFEPime (MAXIPIME) IV extended infusion 2 g Q12H   doxycycline 100 mg 2 times per day   escitalopram oxalate 5 mg Daily 0900   gabapentin 400 mg TID   heparin 5,000 Units 3 times per day  lidocaine 1 patch Q24H   melatonin 6 mg Nightly (2100)   methadone 30 mg Daily 0900   prazosin 1 mg Nightly (2100)   senna-docusate 2 tablet BID      IV Meds:      PRN Medications:    cyclobenzaprine 5 mg BID PRN   hydrOXYzine HCl 10 mg Q4H PRN   oxyCODONE 10 mg Q4H PRN   Or     oxyCODONE 5 mg Q4H PRN   polyethylene glycol 17 g BID PRN   traZODone 25 mg Nightly PRN       Imaging:  No results found.      Assessment / Plan:   Anne Goodwin is a 37 y.o. 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  Surgery team completed I&D at bedside on 07/27/18.      Right Dorsal FA Abscess  - Last I&D 9/26, 1 cm incision made at that time with purulent drainage.  No further areas of fluctuance and pain has improved significantly to this area, though remains tender.  - Medicine performed I&D on 9/25 and culture sent, but this was subsequently cancelled.  - Continue packing changes daily.    - Recommend to remove dressing prior to shower, pack with small amount (1/2-1 inch) of 1/4 inch guaze.  Avoid packing cavity too tightly as this will keep cavity open.  Dressing changed and demonstrated to patient today.    Outpatient follow up cancelled.  Will continue to follow peripherally.  Please contact [facility] if patient is discharged in the next week, so we can reassess the wound prior to discharge.    Terrick Allred S. Edwena Bunde, CNP  Department of Surgery  Division of Trauma, Surgical Critical Care, and Acute Care Surgery  Academic Office:  [phone]  Acute Care Surgery Pager:  [phone]    ??

## 2018-08-01 NOTE — Unmapped (Signed)
Problem: Fall Prevention  Goal: Patient will remain free of falls  Assess and monitor vitals signs, neurological status including level of consciousness and orientation.  Reassess fall risk per hospital policy.    Ensure arm band on, uncluttered walking paths in room, adequate room lighting, call light and overbed table within reach, bed in low position, wheels locked, side rails up per policy (excluding SNF), and non-skid footwear provided.    Outcome: Progressing  Patient has crutches at bedside to aid in ambulation, bed in lowest position and call light placed within reach

## 2018-08-01 NOTE — Unmapped (Signed)
Subspecialty Follow-up Appointment Chart Note    Service: Infectious Diseases    Subspecialty Follow-up Disposition:  The patient requires subspecialty follow-up as noted below:   Note: This information is for [facility] Scheduling use only.  It is not the responsibility of the primary inpatient service to schedule this appointment.  1. Visit type:Faculty Clinic  2. Name of provider to schedule with: Roselee Nova /Yaritzy Huser   3. Timing: The appointment should be scheduled in 3 weeks.  4. Location: [place]  5. Ok to [employer] if there are no open appointment slots?: No  Reason for follow up: ) ??Myositis/ septic arthritis of the left sacroiliac joint without drainable fluid collection - presumably this is secondary to Serratia bacteremia.  ?? Continue cefepime  ?? Plan for at least 4 weeks therapy -   ?? stop date 08/22/18  ??  PICC care q weekly   ?? If discharged please order the following for outpatient antibiotic management:  ?? Please obtain the follow labs and fax to the [facility] at [phone].  ?? Every Monday: ESR, CRP, Complete Metabolic Panel, CBC with diff  ?? Please order routine PICC Care with weekly and PRN sterile dressing changes  ?? The phone number for the [facility] is [phone] for any questions.  ?? ??ESR 27 , CRP 4.8 ON 9/30   ??  2) Serratia bacteremia  ?? Continue cefepime  ?? ??blood cultures from 9/24 are ngtd; last positive cultures 9/22  6.   7. Comments:   8. In case of scheduling conflict, contact Pager/Cell phone number: [phone]       [facility] Jacinta Shoe, MD  08/01/2018  6:06 PM

## 2018-08-01 NOTE — Unmapped (Signed)
INFECTIOUS DISEASE PROGRESS NOTE    08/01/2018  8:17 AM    Anne Goodwin is a 37 y.o. female patient. Hospital Day: 9     Chief Complaint/Reason for Follow-up: Infective Myositis     Subjective:    Subjective   Patient still with back pain going down Left leg   Tolerating IV antibiotics well     Review of Systems   ROS negative except for above     Objective:  Vital signs:  Vitals:    08/01/18 0700   BP: 97/53   Pulse: 68   Resp: 18   Temp: 98.1 ??F (36.7 ??C)   SpO2: 98%     Temp last 24 hours:Temp (24hrs), Avg:98.2 ??F (36.8 ??C), Min:97.6 ??F (36.4 ??C), Max:98.8 ??F (37.1 ??C)    Scheduled Meds:  ??? acetaminophen  975 mg Oral 3 times per day   ??? ceFEPime (MAXIPIME) IV extended infusion  2 g Intravenous Q12H   ??? doxycycline  100 mg Oral 2 times per day   ??? escitalopram oxalate  5 mg Oral Daily 0900   ??? gabapentin  400 mg Oral TID   ??? heparin  5,000 Units Subcutaneous 3 times per day   ??? lidocaine  1 patch Transdermal Q24H   ??? melatonin  6 mg Oral Nightly (2100)   ??? methadone  30 mg Oral Daily 0900   ??? prazosin  1 mg Oral Nightly (2100)   ??? senna-docusate  2 tablet Oral BID     Continuous Infusions:  PRN Meds:cyclobenzaprine, hydrOXYzine HCl, oxyCODONE **OR** oxyCODONE, polyethylene glycol, traZODone    Intake/Output last 3 shifts:       Physical Exam     Gen: thin WF, appears stated age, thin, laying in bed in mild discomfort  Cardio: nl S1/S2, RRR, no r/m/g  Lungs: CTAB, nl WOB  Abd: soft, nontender, BS+  Ext: no clubbing, cyanosis, edema; no nail changes  Tenderness Left lower back   PICC line is clean     Labs:       Lab 07/31/18  0353   WBC 4.9   HEMOGLOBIN 11.2*   HEMATOCRIT 33.3*   MEAN CORPUSCULAR VOLUME 83.7   PLATELETS 303        Lab 07/31/18  0353 07/28/18  0623   SODIUM 138 137   POTASSIUM 4.5 4.0   CHLORIDE 97* 100   CO2 31 31   BUN 16 12   CREATININE 0.54* 0.58*   GLUCOSE 96 110*   CALCIUM 10.0 9.3   MAGNESIUM  --  2.0   PHOSPHORUS  --  4.1          Lab 07/31/18  0353   ALK PHOS 69   AST 14   ALT 9    BILIRUBIN TOTAL 0.3             Assessment/Plan:  Principal Problem:    Infective myositis    ??  37 y.o.??female??with a history of IVDU, DM who was admitted 07/23/2018??with hip pain. ??She had previously been admitted to [facility] on 07/13/18 and had MRI of the pelvis which showed myositis in the left piriformis, gluteus maximus and left SI joint effusion. ????Blood cultures on 9/12 were negative. ??Found to be Hep A IgM, Hep B core IgM, HCV Ab positive. Hep B S Ag negative. ??Had negative TEE on 07/18/18 - left AMA afterwards. ??Used heroin on 9/21 - presented to UC on 9/22. ??Ortho consulted for possible SI joint infection - recommended ir aspirate. ??  MRI of the pelvis is pending. ??Blood culture 9/22 positive for Serratia marcescens.  ??  Feeling somewhat better today - pain in hip and back ongoing but more tolerable. ??  [facility] as 7/10   ??  1) ??Myositis/ septic arthritis of the left sacroiliac joint without drainable fluid collection - presumably this is secondary to Serratia bacteremia.  ?? Continue cefepime  ?? Plan for at least 4 weeks therapy -   ?? stop date 08/22/18  ??  PICC care q weekly   ?? If discharged please order the following for outpatient antibiotic management:  ?? Please obtain the follow labs and fax to the [facility] at [phone].  ?? Every Monday: ESR, CRP, Complete Metabolic Panel, CBC with diff  ?? Please order routine PICC Care with weekly and PRN sterile dressing changes  ?? The phone number for the [facility] is [phone] for any questions.  ?? ??ESR 27 , CRP 4.8 ON 9/30   ??  2) Serratia bacteremia  ?? Continue cefepime  ?? ??blood cultures from 9/24 are ngtd; last positive cultures 9/22  ??  3) Hepatitis - LFTs normal  ?? HCV RNA detectable but <15 - possibly consistent with clearing infection.  ?? Hep B DNA negative - check Hepatitis B surface Ab  ??  4) Right forearm abscess - drained per [facility] - no cultures  ?? Follow clinically -  ?? Continue doxycyline   ??  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 HIV  negative 9/22.  ADS consulted - on methadone    Discussed with NP     Dr.Smulian will assume ID care from 10/2   ??    Anne Valentine Jacinta Shoe, MD  08/01/2018   Team 3 ID attending   Cell [phone]

## 2018-08-01 NOTE — Progress Notes (Signed)
Hospital Medicine  Daily Progress Note      Chief Complaint / Reason for Follow-Up     Anne Goodwin is a 37 y.o. female on hospital day 48.  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      Interval History     Complaining of persistent pain.  Says that it is reasonably well controlled if she takes oxycodone regularly.     Review of Systems (focused)     No fever, chills, SOB, cough, CP, abdominal pain, N/V, diarrhea or dysuria.       Medications     Scheduled Meds:   acetaminophen  975 mg Oral 3 times per day    ceFEPime (MAXIPIME) IV extended infusion  2 g Intravenous Q12H    doxycycline  100 mg Oral 2 times per day    escitalopram oxalate  5 mg Oral Daily 0900    gabapentin  400 mg Oral TID    heparin  5,000 Units Subcutaneous 3 times per day    lidocaine  1 patch Transdermal Q24H    melatonin  6 mg Oral Nightly (2100)    methadone  30 mg Oral Daily 0900    prazosin  1 mg Oral Nightly (2100)    senna-docusate  2 tablet Oral BID     Continuous Infusions:  PRN Meds:cyclobenzaprine, hydrOXYzine HCl, oxyCODONE **OR** oxyCODONE, polyethylene glycol, traZODone      Vital Signs     Temp:  [97.6 F (36.4 C)-98.6 F (37 C)] 98.1 F (36.7 C)  Heart Rate:  [60-84] 84  Resp:  [12-18] 16  BP: (97-120)/(53-69) 117/69    Intake/Output Summary (Last 24 hours) at 08/01/18 1433  Last data filed at 08/01/18 1200   Gross per 24 hour   Intake              240 ml   Output                0 ml   Net              240 ml         Physical Exam     Gen - alert, sitting up in chair next to bed in no acute distress  Eyes - normal conjunctiva, normal pupils  ENT - moist mucosa, normal color  Neck - supple, normal motion  CV - RRR, no MRG, no edema  Lung - clear to auscultation bilaterally, normal work of breathing  Abd - bowel sounds present in all quadrants, soft nontender, nondistended, no organomegaly  MSK -  no joint swelling, no muscle tenderness  Skin - normal temp, no rashes  Neuro -  alert, moves all extremities spontaneously  Psych - normal mood, normal behavior    Laboratory Data         Lab 07/31/18  0353 07/28/18  0623 07/27/18  0333 07/26/18  1055   WBC 4.9 4.5 5.8 5.1   HEMOGLOBIN 11.2* 10.8* 10.5* 10.6*   HEMATOCRIT 33.3* 33.1* 31.9* 32.4*   MEAN CORPUSCULAR VOLUME 83.7 83.8 82.6 82.5   PLATELETS 303 270 287 281           Lab 07/31/18  0353 07/28/18  0623 07/27/18  0333 07/26/18  1055   SODIUM 138 137 140 138   POTASSIUM 4.5 4.0 4.1 3.8   CHLORIDE 97* 100 103 102   CO2 31 31 28 26    BUN 16 12 13 10    CREATININE 0.54*  0.58* 0.48* 0.58*   GLUCOSE 96 110* 94 149*           Lab 07/31/18  0353 07/28/18  0623 07/27/18  0333 07/26/18  1055   CALCIUM 10.0 9.3 9.4 9.6   MAGNESIUM  --  2.0 2.0  --    PHOSPHORUS  --  4.1 4.5 3.9           Lab 07/30/18  1238 07/30/18  0911 07/29/18  2207 07/29/18  1809   POC GLU MONITORING DEVICE 96 113* 139* 122*       Diagnostic Studies     None new    Assessment & Plan     Anne Goodwin is a 37 y.o. female on hospital day 9.  The medical issues being addressed in today's encounter are as follows:      Infective myositis  Left sacro-ileal joint osteomyelitis  Serratia marcescens bacteremia  Patient will need 4 weeks of IV antibiotics per Infectious Disease  - Continue cefepime. Tentative end date is 08/22/2018  - Safety labs every Monday: ESR, CRP, complete metabolic panel, CBC with differential  - Pain management:   - Scheduled acetaminophen   - Lidocaine patch   - Continue oxycodone 10 mg every 4 hours as needed   - Senna-docusate two tablets twice daily for constipation prophylaxis   - Miralaix BID as needed for constipation   - Continue gabapentin 400 mg TID    Right forearm abscess  Drained by [facility] 07/27/2018. No culture sent  - Continue doxycycline for a total of 7 days    Opioid use disorder  Injects heroin and fentanyl. Has met with Addiction Services  - Continue methadone 30 mg daily    Anxiety/depression  Psychiatry involved.  - Continue hydroxyzine  as needed for anxiety  - Continue escitalopram  - Psychiatry - cognitive behavioral therapy   - Checked EKG for new QTc measurement after starting escitalopram. QTc not significantly changed    Insomnia  PTSD  Patient having difficulty sleeping. Psychiatry involved.  - Continue melatonin  - Continue prazosin 1 mg at bedtime for PTSD nightmares  - Continue trazodone 25 mg at bedtime as needed for sleep    Type 2 diabetes  HbA1c 6.6%.  - Has been on regular diet and patient's glucose levels have all been at goal for her entire stay.   - Discontinued fingerstick checks. Discussed with patient, and she was ok with this. Can reassess if clinical risks of hyperglycemia change during stay.    Normocytic anemia  May be anemia of chronic inflammation given osteomyelitis/infection  - Treat underlying cause  - Follow up outpatient    DVT PPx: SQH     Diet:   Diet Orders          Diet regular starting at 09/24 1245        Code Status: Full Code    A portion of this note was copied forward from the note written by Dr.Rose on 07/31/18.  I have reviewed and updated the history, physical exam, data, assessment, and plan of the note so that it reflects the evaluation and management of the patient today.    Emeterio Reeve, MD  Department of Internal Medicine  Pager ID [phone]  2:33 PM, 08/01/2018

## 2018-08-02 ENCOUNTER — Ambulatory Visit: Payer: PRIVATE HEALTH INSURANCE | Primary: Family

## 2018-08-02 MED FILL — HEPARIN (PORCINE) 5,000 UNIT/ML INJECTION SOLUTION: 5000 5,000 unit/mL | INTRAMUSCULAR | Qty: 1

## 2018-08-02 MED FILL — GABAPENTIN 400 MG CAPSULE: 400 400 MG | ORAL | Qty: 1

## 2018-08-02 MED FILL — OXYCODONE 5 MG TABLET: 5 5 MG | ORAL | Qty: 2

## 2018-08-02 MED FILL — TYLENOL 325 MG TABLET: 325 325 mg | ORAL | Qty: 3

## 2018-08-02 MED FILL — SENNOSIDES 8.6 MG-DOCUSATE SODIUM 50 MG TABLET: 8.6-50 8.6-50 mg | ORAL | Qty: 2

## 2018-08-02 MED FILL — CYCLOBENZAPRINE 5 MG TABLET: 5 5 MG | ORAL | Qty: 1

## 2018-08-02 MED FILL — CEFEPIME 2 GRAM SOLUTION FOR INJECTION: 2 2 gram | INTRAMUSCULAR | Qty: 1

## 2018-08-02 MED FILL — MELATONIN 3 MG TABLET: 3 3 mg | ORAL | Qty: 2

## 2018-08-02 MED FILL — SODIUM CHLORIDE 0.9 % INTRAVENOUS PIGGYBACK: 2.00 2.00 g | INTRAVENOUS | Qty: 100

## 2018-08-02 MED FILL — ESCITALOPRAM 10 MG TABLET: 10 10 MG | ORAL | Qty: 1

## 2018-08-02 MED FILL — DOXYCYCLINE MONOHYDRATE 100 MG CAPSULE: 100 100 MG | ORAL | Qty: 1

## 2018-08-02 MED FILL — PRAZOSIN 1 MG CAPSULE: 1 1 MG | ORAL | Qty: 1

## 2018-08-02 MED FILL — METHADONE 5 MG/5 ML ORAL SOLUTION: 5 5 mg/5 mL | ORAL | Qty: 30

## 2018-08-02 MED FILL — LIDODERM 5 % TOPICAL PATCH: 5 5 % | TOPICAL | Qty: 1

## 2018-08-02 NOTE — Unmapped (Signed)
Hospital Medicine  Daily Progress Note      Chief Complaint / Reason for Follow-Up     Anne Goodwin is a 37 y.o. female on hospital day 10.  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      Interval History     Still with pain but says that oxycodone is helping.  No other complaints this AM.     Review of Systems (focused)     No fever, chills, SOB, cough, CP, abdominal pain, N/V, diarrhea or dysuria.       Medications     Scheduled Meds:  ??? acetaminophen  975 mg Oral 3 times per day   ??? ceFEPime (MAXIPIME) IV extended infusion  2 g Intravenous Q12H   ??? doxycycline  100 mg Oral 2 times per day   ??? escitalopram oxalate  5 mg Oral Daily 0900   ??? gabapentin  400 mg Oral TID   ??? heparin  5,000 Units Subcutaneous 3 times per day   ??? lidocaine  1 patch Transdermal Q24H   ??? melatonin  6 mg Oral Nightly (2100)   ??? methadone  30 mg Oral Daily 0900   ??? prazosin  1 mg Oral Nightly (2100)   ??? senna-docusate  2 tablet Oral BID     Continuous Infusions:  PRN Meds:cyclobenzaprine, hydrOXYzine HCl, oxyCODONE **OR** oxyCODONE, polyethylene glycol, traZODone      Vital Signs     Temp:  [97.6 ??F (36.4 ??C)-98.4 ??F (36.9 ??C)] 98.1 ??F (36.7 ??C)  Heart Rate:  [67-84] 84  Resp:  [14-16] 16  BP: (103-130)/(60-74) 115/61    Intake/Output Summary (Last 24 hours) at 08/02/18 1127  Last data filed at 08/01/18 2050   Gross per 24 hour   Intake              820 ml   Output                0 ml   Net              820 ml         Physical Exam     Gen - alert, sitting up in chair next to bed in no acute distress  Eyes - normal conjunctiva, normal pupils  ENT - moist mucosa, normal color  Neck - supple, normal motion  CV - RRR, no MRG, no edema  Lung - clear to auscultation bilaterally, normal work of breathing  Abd - bowel sounds present in all quadrants, soft nontender, nondistended, no organomegaly  MSK -  no joint swelling, no muscle tenderness  Skin - normal temp, no rashes  Neuro - alert, moves all extremities  spontaneously  Psych - normal mood, normal behavior    Laboratory Data         Lab 07/31/18  0353 07/28/18  0623 07/27/18  0333   WBC 4.9 4.5 5.8   HEMOGLOBIN 11.2* 10.8* 10.5*   HEMATOCRIT 33.3* 33.1* 31.9*   MEAN CORPUSCULAR VOLUME 83.7 83.8 82.6   PLATELETS 303 270 287           Lab 07/31/18  0353 07/28/18  0623 07/27/18  0333   SODIUM 138 137 140   POTASSIUM 4.5 4.0 4.1   CHLORIDE 97* 100 103   CO2 31 31 28    BUN 16 12 13    CREATININE 0.54* 0.58* 0.48*   GLUCOSE 96 110* 94  Lab 07/31/18  0353 07/28/18  0623 07/27/18  0333   CALCIUM 10.0 9.3 9.4   MAGNESIUM  --  2.0 2.0   PHOSPHORUS  --  4.1 4.5           Lab 07/30/18  1238 07/30/18  0911 07/29/18  2207 07/29/18  1809   POC GLU MONITORING DEVICE 96 113* 139* 122*       Diagnostic Studies     None new    Assessment & Plan     Anne Goodwin is a 37 y.o. female on hospital day 10.  The medical issues being addressed in today's encounter are as follows:      Infective myositis  Left sacro-ileal joint osteomyelitis  Serratia marcescens bacteremia  Patient will need 4 weeks of IV antibiotics per Infectious Disease  - Continue cefepime. Tentative end date is 08/22/2018  - Safety labs every Monday: ESR, CRP, complete metabolic panel, CBC with differential  - Pain management:   - Scheduled acetaminophen   - Lidocaine patch   - Continue oxycodone 10 mg every 4 hours as needed   - Senna-docusate two tablets twice daily for constipation prophylaxis   - Miralaix BID as needed for constipation   - Continue gabapentin 400 mg TID    Right forearm abscess  Drained by [facility] 07/27/2018. No culture sent  - Continue doxycycline for a total of 7 days (day 6/7)    Opioid use disorder  Injects heroin and fentanyl. Has met with Addiction Services  - Continue methadone 30 mg daily    Anxiety/depression  Psychiatry involved.  - Continue hydroxyzine as needed for anxiety  - Continue escitalopram  - Psychiatry - cognitive behavioral therapy   - Checked EKG for new QTc  measurement after starting escitalopram. QTc not significantly changed    Insomnia  PTSD  Patient having difficulty sleeping. Psychiatry involved.  - Continue melatonin  - Continue prazosin 1 mg at bedtime for PTSD nightmares  - Continue trazodone 25 mg at bedtime as needed for sleep    Type 2 diabetes  HbA1c 6.6%.  - Has been on regular diet and patient's glucose levels have all been at goal for her entire stay.   - Discontinued fingerstick checks. Discussed with patient, and she was ok with this. Can reassess if clinical risks of hyperglycemia change during stay.    Normocytic anemia  May be anemia of chronic inflammation given osteomyelitis/infection  - Treat underlying cause  - Follow up outpatient    DVT PPx: SQH     Diet:   Diet Orders          Diet regular starting at 09/24 1245        Code Status: Full Code    A portion of this note was copied forward from the note written by Dr.Rose on 07/31/18.  I have reviewed and updated the history, physical exam, data, assessment, and plan of the note so that it reflects the evaluation and management of the patient today.    Emeterio Reeve, MD  Department of Internal Medicine  Pager ID [phone])  11:27 AM, 08/02/2018

## 2018-08-02 NOTE — Unmapped (Signed)
INFECTIOUS DISEASE PROGRESS NOTE    08/02/2018  10:59 PM    Anne Goodwin is a 37 y.o. female patient. Hospital Day: 10     Chief Complaint/Reason for Follow-up: Myositis    Subjective:    Still with left sacro-iliac /gluteal pain, but improving  No fevers or chills  No chest pain      Review of Systems   Constitutional: Positive for fatigue. Negative for chills and fever.   HENT: Negative for mouth sores.    Respiratory: Negative for shortness of breath.    Cardiovascular: Negative for chest pain and leg swelling.   Gastrointestinal: Negative for abdominal pain.   Genitourinary: Negative for dysuria.   Musculoskeletal: Positive for back pain and myalgias.       Objective:  Vital signs:  Vitals:    08/02/18 2110   BP: 125/70   Pulse: 77   Resp: 18   Temp: 98.2 ??F (36.8 ??C)   SpO2: 99%     Temp last 24 hours:Temp (24hrs), Avg:97.9 ??F (36.6 ??C), Min:97.6 ??F (36.4 ??C), Max:98.2 ??F (36.8 ??C)    Scheduled Meds:  ??? acetaminophen  975 mg Oral 3 times per day   ??? ceFEPime (MAXIPIME) IV extended infusion  2 g Intravenous Q12H   ??? doxycycline  100 mg Oral 2 times per day   ??? escitalopram oxalate  5 mg Oral Daily 0900   ??? gabapentin  400 mg Oral TID   ??? heparin  5,000 Units Subcutaneous 3 times per day   ??? lidocaine  1 patch Transdermal Q24H   ??? melatonin  6 mg Oral Nightly (2100)   ??? methadone  30 mg Oral Daily 0900   ??? prazosin  1 mg Oral Nightly (2100)   ??? senna-docusate  2 tablet Oral BID     Continuous Infusions:  PRN Meds:cyclobenzaprine, hydrOXYzine HCl, oxyCODONE **OR** oxyCODONE, polyethylene glycol, traZODone    Intake/Output last 3 shifts:    Date 08/01/18 1500 - 08/02/18 0659 08/02/18 0700 - 08/03/18 0659   Shift 1500-2259 2300-0659 24 Hour Total 0700-1459 1500-2259 2300-0659 24 Hour Total   I  N  T  A  K  E   P.O. 480  720          P.O. 480  720        IV Piggyback 100  100          Volume (mL) (cefepime (MAXIPIME) 2 g in sodium chloride 0.9% 100 mL IVPB ADDaptor) 100  100        Shift Total  (mL/kg)  580  (9.8)  820  (13.9)       O  U  T  P  U  T   Urine  (mL/kg/hr)             Urine Occurrence 2 x  4 x        Stool             Stool Occurrence 1 x  1 x        Shift Total  (mL/kg)          Weight (kg) 59 59 59 59 59 59 59       Physical Exam   Nursing note and vitals reviewed.  Constitutional: No distress.   HENT:   Mouth/Throat: No oropharyngeal exudate.   Eyes: Pupils are equal, round, and reactive to light. No scleral icterus.   Neck: Neck supple. No JVD present.   Cardiovascular: Normal  rate, regular rhythm, normal heart sounds and intact distal pulses.    Pulmonary/Chest: Effort normal and breath sounds normal. She has no rales.   Abdominal: Soft. Bowel sounds are normal. There is no tenderness.   Musculoskeletal: She exhibits no edema.   Tender left SI paraspinal region with radiation to left buttock       Labs:       Lab 07/31/18  0353   WBC 4.9   HEMOGLOBIN 11.2*   HEMATOCRIT 33.3*   MEAN CORPUSCULAR VOLUME 83.7   PLATELETS 303        Lab 07/31/18  0353 07/28/18  0623   SODIUM 138 137   POTASSIUM 4.5 4.0   CHLORIDE 97* 100   CO2 31 31   BUN 16 12   CREATININE 0.54* 0.58*   GLUCOSE 96 110*   CALCIUM 10.0 9.3   MAGNESIUM  --  2.0   PHOSPHORUS  --  4.1          Lab 07/31/18  0353   ALK PHOS 69   AST 14   ALT 9   BILIRUBIN TOTAL 0.3             Assessment/Plan:  Principal Problem:    Infective myositis    Septic arthritis/myositis left   Prior serratia bacteremia   Responding to therapy   Planned for IV therapy to 10/22    Substance abuse    Followed by addiction services      Arbie Cookey, MD  08/02/2018  Pager [phone]
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
## 2018-08-02 NOTE — Unmapped (Signed)
RN CM spoke with patient at bedside. Patient in agreement to go to [facility].  Patient asks if her pain medications will be continued and wants to confirm start date. Will get back to patient. Hopeful for discharge 10/3.      Waiting on precert.      Gwyndolyn Kaufman, RN CM  [address] [place], RN Care Coordinator  [phone] (phone)  [phone] (fax)

## 2018-08-02 NOTE — Plan of Care (Signed)
Problem: Fall Prevention  Goal: Patient will remain free of falls  Assess and monitor vitals signs, neurological status including level of consciousness and orientation.  Reassess fall risk per hospital policy.    Ensure arm band on, uncluttered walking paths in room, adequate room lighting, call light and overbed table within reach, bed in low position, wheels locked, side rails up per policy (excluding SNF), and non-skid footwear provided.    Outcome: Progressing  Explained to pt not to get out of bed without calling for assistance. Bed alarm set, call light and bed side table within reach. Pt alert oriented

## 2018-08-02 NOTE — Unmapped (Signed)
Problem: Fall Prevention  Goal: Patient will remain free of falls  Assess and monitor vitals signs, neurological status including level of consciousness and orientation.  Reassess fall risk per hospital policy.    Ensure arm band on, uncluttered walking paths in room, adequate room lighting, call light and overbed table within reach, bed in low position, wheels locked, side rails up per policy (excluding SNF), and non-skid footwear provided.    Outcome: Progressing  PT remains free of falls os far this shift. PT is A&O x4 with good safety awareness noted. PT has call light in reach and safety interventions in place.

## 2018-08-02 NOTE — Care Coordination-Inpatient (Signed)
Call placed to Tiffany/ [facility] [phone].  Patient does not have precert but it is expected back today.    Patient will need to do intake at the [facility] in the morning (6-? Am).  [facility] will pick up the medication prior to 11 am. Will discuss this at rounds.    RN CM will arrange transportation after patient returns from the Clinic to [place] [facility] [place].      Shelly A. Leavy Cella, RN CM  [address] [place], RN Care Coordinator  [phone] (phone)  [phone] (fax)

## 2018-08-02 NOTE — Care Coordination-Inpatient (Signed)
RN CM attended rounds.  Patient can dc tomorrow to [facility]. [facility]. Transported by PCA/Nurse at 6 am for intake. Clinic will call when patient is ready to be brought back.    [facility] to pick up medications at 11 am.      Shelly A. Leavy Cella, RN CM  [address] [place], RN Care Coordinator  [phone] (phone)  [phone] (fax)

## 2018-08-03 MED FILL — SENNOSIDES 8.6 MG-DOCUSATE SODIUM 50 MG TABLET: 8.6-50 8.6-50 mg | ORAL | Qty: 2

## 2018-08-03 MED FILL — DOXYCYCLINE MONOHYDRATE 100 MG CAPSULE: 100 100 MG | ORAL | Qty: 1

## 2018-08-03 MED FILL — LIDODERM 5 % TOPICAL PATCH: 5 5 % | TOPICAL | Qty: 1

## 2018-08-03 MED FILL — OXYCODONE 5 MG TABLET: 5 5 MG | ORAL | Qty: 2

## 2018-08-03 MED FILL — CYCLOBENZAPRINE 5 MG TABLET: 5 5 MG | ORAL | Qty: 1

## 2018-08-03 MED FILL — HYDROXYZINE HCL 10 MG TABLET: 10 10 MG | ORAL | Qty: 1

## 2018-08-03 MED FILL — CEFEPIME 2 GRAM SOLUTION FOR INJECTION: 2 2 gram | INTRAMUSCULAR | Qty: 1

## 2018-08-03 MED FILL — GABAPENTIN 400 MG CAPSULE: 400 400 MG | ORAL | Qty: 1

## 2018-08-03 MED FILL — MELATONIN 3 MG TABLET: 3 3 mg | ORAL | Qty: 2

## 2018-08-03 MED FILL — TYLENOL 325 MG TABLET: 325 325 mg | ORAL | Qty: 3

## 2018-08-03 MED FILL — SODIUM CHLORIDE 0.9 % INTRAVENOUS PIGGYBACK: 2.00 2.00 g | INTRAVENOUS | Qty: 100

## 2018-08-03 MED FILL — PRAZOSIN 1 MG CAPSULE: 1 1 MG | ORAL | Qty: 1

## 2018-08-03 MED FILL — ESCITALOPRAM 10 MG TABLET: 10 10 MG | ORAL | Qty: 1

## 2018-08-03 MED FILL — METHADONE 5 MG/5 ML ORAL SOLUTION: 5 5 mg/5 mL | ORAL | Qty: 30

## 2018-08-03 MED FILL — HEPARIN (PORCINE) 5,000 UNIT/ML INJECTION SOLUTION: 5000 5,000 unit/mL | INTRAMUSCULAR | Qty: 1

## 2018-08-03 MED FILL — TRAZODONE 25 MG DOSE: 50 50 MG | ORAL | Qty: 1

## 2018-08-03 NOTE — Care Coordination-Inpatient (Signed)
Patient's ID has expired.  Message left for SW Manager Maeola Sarah to determine if patient can be transported to [facility].    Methadone clinic not open until Monday. If license can be obtained we can discharge patient Sunday after dosing.      Shelly A. Leavy Cella, RN CM  [address] [place], RN Care Coordinator  [phone] (phone)  [phone] (fax)

## 2018-08-03 NOTE — Plan of Care (Signed)
Problem: Acute Pain  Patient's pain progressing toward patient's stated pain goal   Goal: Patient will manage pain with the appropriate technique/intervention  Assess and monitor patient's pain using appropriate pain scale. Collaborate with interdisciplinary team and initiate plan and interventions as ordered.  Re-assess patient's pain level 30-60 minutes after pain management intervention.   Outcome: Progressing  Pin medication given appropriately after pain assessment. Reassessment planned for 30 mins after the medication. Will continue to monitor.

## 2018-08-03 NOTE — Unmapped (Signed)
Hospital Medicine  Daily Progress Note      Chief Complaint / Reason for Follow-Up     Anne Goodwin is a 37 y.o. female on hospital day 38.  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      Interval History     Feeling OK this AM.  Pain is controlled.     Review of Systems (focused)     No fever, chills, SOB, cough, CP, abdominal pain, N/V, diarrhea or dysuria.       Medications     Scheduled Meds:  ??? acetaminophen  975 mg Oral 3 times per day   ??? ceFEPime (MAXIPIME) IV extended infusion  2 g Intravenous Q12H   ??? doxycycline  100 mg Oral 2 times per day   ??? escitalopram oxalate  5 mg Oral Daily 0900   ??? gabapentin  400 mg Oral TID   ??? heparin  5,000 Units Subcutaneous 3 times per day   ??? lidocaine  1 patch Transdermal Q24H   ??? melatonin  6 mg Oral Nightly (2100)   ??? methadone  30 mg Oral Daily 0900   ??? prazosin  1 mg Oral Nightly (2100)   ??? senna-docusate  2 tablet Oral BID     Continuous Infusions:  PRN Meds:cyclobenzaprine, hydrOXYzine HCl, oxyCODONE **OR** oxyCODONE, polyethylene glycol, traZODone      Vital Signs     Temp:  [97.8 ??F (36.6 ??C)-98.2 ??F (36.8 ??C)] 97.8 ??F (36.6 ??C)  Heart Rate:  [69-77] 71  Resp:  [16-20] 20  BP: (107-125)/(62-70) 107/62    Intake/Output Summary (Last 24 hours) at 08/03/18 1331  Last data filed at 08/03/18 0900   Gross per 24 hour   Intake              460 ml   Output                0 ml   Net              460 ml         Physical Exam     Gen - alert, sitting up in chair next to bed in no acute distress  Eyes - normal conjunctiva, normal pupils  ENT - moist mucosa, normal color  Neck - supple, normal motion  CV - RRR, no MRG, no edema  Lung - clear to auscultation bilaterally, normal work of breathing  Abd - bowel sounds present in all quadrants, soft nontender, nondistended, no organomegaly  MSK -  no joint swelling, no muscle tenderness  Skin - normal temp, no rashes  Neuro - alert, moves all extremities spontaneously  Psych - normal mood,  normal behavior    Laboratory Data         Lab 07/31/18  0353 07/28/18  0623   WBC 4.9 4.5   HEMOGLOBIN 11.2* 10.8*   HEMATOCRIT 33.3* 33.1*   MEAN CORPUSCULAR VOLUME 83.7 83.8   PLATELETS 303 270           Lab 07/31/18  0353 07/28/18  0623   SODIUM 138 137   POTASSIUM 4.5 4.0   CHLORIDE 97* 100   CO2 31 31   BUN 16 12   CREATININE 0.54* 0.58*   GLUCOSE 96 110*           Lab 07/31/18  0353 07/28/18  0623   CALCIUM 10.0 9.3   MAGNESIUM  --  2.0   PHOSPHORUS  --  4.1           Lab 07/30/18  1238 07/30/18  0911 07/29/18  2207 07/29/18  1809   POC GLU MONITORING DEVICE 96 113* 139* 122*       Diagnostic Studies     None new    Assessment & Plan     Anne Goodwin is a 37 y.o. female on hospital day 45.  The medical issues being addressed in today's encounter are as follows:      Infective myositis  Left sacro-ileal joint osteomyelitis  Serratia marcescens bacteremia  Patient will need 4 weeks of IV antibiotics per Infectious Disease  - Continue cefepime. Tentative end date is 08/22/2018  - Safety labs every Monday: ESR, CRP, complete metabolic panel, CBC with differential  - Pain management:   - Scheduled acetaminophen   - Lidocaine patch   - Continue oxycodone 10 mg every 4 hours as needed   - Senna-docusate two tablets twice daily for constipation prophylaxis   - Miralaix BID as needed for constipation   - Continue gabapentin 400 mg TID    Right forearm abscess  Drained by [facility] 07/27/2018. No culture sent  - Continue doxycycline for a total of 7 days (day 6/7)    Opioid use disorder  Injects heroin and fentanyl. Has met with Addiction Services  - Continue methadone 30 mg daily, will attempt to arrange for pt to go to [facility] for new ID since hers is expired, if able to obtain, can go to methadone clinic on Monday     Anxiety/depression  Psychiatry involved.  - Continue hydroxyzine as needed for anxiety  - Continue escitalopram  - Psychiatry - cognitive behavioral therapy   - Checked EKG for new QTc measurement after  starting escitalopram. QTc not significantly changed    Insomnia  PTSD  Patient having difficulty sleeping. Psychiatry involved.  - Continue melatonin  - Continue prazosin 1 mg at bedtime for PTSD nightmares  - Continue trazodone 25 mg at bedtime as needed for sleep    Type 2 diabetes  HbA1c 6.6%.  - Has been on regular diet and patient's glucose levels have all been at goal for her entire stay.   - Discontinued fingerstick checks. Discussed with patient, and she was ok with this. Can reassess if clinical risks of hyperglycemia change during stay.    Normocytic anemia  May be anemia of chronic inflammation given osteomyelitis/infection  - Treat underlying cause  - Follow up outpatient    DVT PPx: SQH     Diet:   Diet Orders          Diet regular starting at 09/24 1245        Code Status: Full Code    A portion of this note was copied forward from the note written by Dr.Rose on 07/31/18.  I have reviewed and updated the history, physical exam, data, assessment, and plan of the note so that it reflects the evaluation and management of the patient today.    Emeterio Reeve, MD  Department of Internal Medicine  Pager ID [phone])  1:31 PM, 08/03/2018

## 2018-08-03 NOTE — Progress Notes (Signed)
Nursing Overnight Progress Note    Significant Events During Shift  Pt slept lightly throughout night. States the pain wakes her up. Nothing further to report.     Patient/Family Concerns  Evening/Overnight Visitors: none  Concerns: none    Assessment  Nursing time demands: low  IV access: has IV access, adequate and functioning  Sitter requirements:  no    Mental Status  Mental Status for the past 14 hrs:   Level of Consciousness Orientation Level Cognition   08/02/18 2116 Alert Oriented X4 Appropriate judgement       Calls to Physician Team  No data found.      Medications  [phone] - Medications Not Given  (last 12 hrs)         ** SITE UNKNOWN **       Medication Name Action Time Action Reason Comments     heparin (porcine) injection 5,000 Units 08/02/18 2112 Not Given Patient/family refused      heparin (porcine) injection 5,000 Units 08/03/18 3244 Not Given Patient/family refused                 Diet/Meals Consumed (past 24 hours)  Nutrition Assessment for the past 24 hrs:   Diet Type Feeding   08/02/18 2116 Regular Able to feed self

## 2018-08-03 NOTE — Unmapped (Addendum)
Patient will follow up at [facility]:    Address: [address] [place], [place], [place] [address]  Phone: [phone]    [facility]  [facility]      [phone]  [facility] Resident Psychotherapy Clinic     [phone]   (this is for those with private insurance or no insurance)   (sliding scale fees available)  [facility]       [phone]   (for PTSD)  [facility] Dept of Psychiatry Theresia Bough)     [phone]  [facility]     [phone]   (for emergencies, such as suicidal thoughts, hallucinations)    [facility]     [phone]   (sliding scale fees available)    Case Management Agencies   [facility])   [phone]    Central Intake      [phone]    Drug Services Intake     [phone] (formerly [facility]) [phone]   [facility]  [phone]    Case Management     [phone]    [facility] Living Services    [phone]   Central Clinic  Adult Intake    [phone]      Child & Family Intake   [phone]   SAMI (Substance Abuse and Mental Illness):   through [facility]    Other Community Mental Health Services (Counseling)   [facility] (accepts [place])    [phone]   [facility] (acceps [place])   [phone]   [facility] (accepts Medicaid)   [phone]   [facility] (accepts [place])  [phone]   [facility] (accepts Medicaid)   [phone]   [facility] (accepts Medicaid)   [phone]    Support Services   MHAP (Mental Health Access Point)    [phone]   Mental Health Board & Recovery Services Board  [phone]   [facility] [place] [facility] [place]    [phone]   281-CARE (suicide and crisis hotline)   [phone]   931-WARM, [facility]     [phone] Home    [phone]   [facility] Helpline      [address] Men   [place]   [phone] - [address] [place]    [phone]   [facility] (Call CAP line)    [phone]    Shelters Women   [employer]      [phone]   [facility] (Call CAP line)    [phone]    Shelters Men & Women   [facility]      [phone]    [facility] Options   [place]      [phone]   [facility]      [phone]   [facility] (women only)    [phone]

## 2018-08-03 NOTE — Unmapped (Signed)
Nursing Day Shift Progress Note    Significant Events During Shift  Pt couldn't get discharged due to not getting medication straighten up early. Possible D/C on Monday after getting ID straighten up. Pt got pain medication given appropriately.     Patient/Family Concerns  Visitors: none  Concerns: none    Assessment  Nursing time demands: moderate  IV access: has IV access, adequate and functioning  Sitter requirements:  no    Mental Status  Mental Status for the past 14 hrs:   Level of Consciousness Orientation Level Cognition   08/03/18 0715 Alert Oriented X4 Appropriate judgement;Follows 2-step commands       Medications  [phone] - Medications Not Given  (last 12 hrs)         ** SITE UNKNOWN **       Medication Name Action Time Action Reason Comments     heparin (porcine) injection 5,000 Units 08/03/18 1209 Not Given Patient/family refused

## 2018-08-03 NOTE — Unmapped (Signed)
Problem: Acute Pain  Patient's pain progressing toward patient's stated pain goal   Goal: Patient verbalizes a reduction in pain level  Outcome: Progressing  Complains of L hip pain that radiates to LLE. Rates pain 8 out 10, managed with PRN oxycodone 10mg  PRN.

## 2018-08-03 NOTE — Care Coordination-Inpatient (Signed)
RN CM received call from [facility].     Patient can be transported today. Patient will be transported to [facility].    Report:[phone] Room G-41  Fax: [phone]    Will discuss discharge in Huddle.        Shelly A. Leavy Cella, RN CM  [address] [place], RN Care Coordinator  [phone] (phone)  [phone] (fax)

## 2018-08-03 NOTE — Unmapped (Signed)
Pt had a methadone clinic appointment this morning. Patient sent off the floor around 0815. Came back with patient because patient didn't have ID on her and the one she has in the hospital was expired. Will continue to update patient about any changes.

## 2018-08-04 MED FILL — CYCLOBENZAPRINE 5 MG TABLET: 5 5 MG | ORAL | Qty: 1

## 2018-08-04 MED FILL — HEPARIN (PORCINE) 5,000 UNIT/ML INJECTION SOLUTION: 5000 5,000 unit/mL | INTRAMUSCULAR | Qty: 1

## 2018-08-04 MED FILL — SENNOSIDES 8.6 MG-DOCUSATE SODIUM 50 MG TABLET: 8.6-50 8.6-50 mg | ORAL | Qty: 2

## 2018-08-04 MED FILL — OXYCODONE 5 MG TABLET: 5 5 MG | ORAL | Qty: 2

## 2018-08-04 MED FILL — CEFEPIME 2 GRAM SOLUTION FOR INJECTION: 2 2 gram | INTRAMUSCULAR | Qty: 1

## 2018-08-04 MED FILL — HYDROXYZINE HCL 10 MG TABLET: 10 10 MG | ORAL | Qty: 1

## 2018-08-04 MED FILL — SODIUM CHLORIDE 0.9 % INTRAVENOUS PIGGYBACK: 2.00 2.00 g | INTRAVENOUS | Qty: 100

## 2018-08-04 MED FILL — ESCITALOPRAM 10 MG TABLET: 10 10 MG | ORAL | Qty: 1

## 2018-08-04 MED FILL — PRAZOSIN 1 MG CAPSULE: 1 1 MG | ORAL | Qty: 1

## 2018-08-04 MED FILL — TYLENOL 325 MG TABLET: 325 325 mg | ORAL | Qty: 3

## 2018-08-04 MED FILL — MELATONIN 3 MG TABLET: 3 3 mg | ORAL | Qty: 2

## 2018-08-04 MED FILL — POLYETHYLENE GLYCOL 3350 17 GRAM ORAL POWDER PACKET: 17 17 gram | ORAL | Qty: 1

## 2018-08-04 MED FILL — LIDODERM 5 % TOPICAL PATCH: 5 5 % | TOPICAL | Qty: 1

## 2018-08-04 MED FILL — GABAPENTIN 400 MG CAPSULE: 400 400 MG | ORAL | Qty: 1

## 2018-08-04 MED FILL — METHADONE 5 MG/5 ML ORAL SOLUTION: 5 5 mg/5 mL | ORAL | Qty: 30

## 2018-08-04 MED FILL — TRAZODONE 25 MG DOSE: 50 50 MG | ORAL | Qty: 1

## 2018-08-04 NOTE — Unmapped (Signed)
Problem: Fall Prevention  Goal: Patient will remain free of falls  Assess and monitor vitals signs, neurological status including level of consciousness and orientation.  Reassess fall risk per hospital policy.    Ensure arm band on, uncluttered walking paths in room, adequate room lighting, call light and overbed table within reach, bed in low position, wheels locked, side rails up per policy (excluding SNF), and non-skid footwear provided.    Outcome: Progressing  Patient alert and oriented x 4, oriented to own limitation,able to ambulate with the use of crutches independently. Bed in low position, call light and personal belongings within reach. Patient endorses no difficulty with ambulation. Cont to monitor.

## 2018-08-04 NOTE — Unmapped (Signed)
Nursing Day Shift Progress Note    Significant Events During Shift  none    Patient/Family Concerns  Visitors: none  Concerns: pain control    Assessment  Nursing time demands: {moderate  IV access: infusing  Sitter requirements:  none    Mental Status  Alert and oriented x4    Medications  [phone] - Medications Not Given  (last 12 hrs)         ** No medications to display **

## 2018-08-04 NOTE — Unmapped (Signed)
Hospital Medicine  Daily Progress Note      Chief Complaint / Reason for Follow-Up     Anne Goodwin is a 37 y.o. female on hospital day 49.  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      Interval History     No complaints this AM.  Agreeable to calling DMV to find out id expired ID sufficient enough for replacement ID.     Review of Systems (focused)     No fever, chills, SOB, cough, CP, abdominal pain, N/V, diarrhea or dysuria.       Medications     Scheduled Meds:  ??? acetaminophen  975 mg Oral 3 times per day   ??? ceFEPime (MAXIPIME) IV extended infusion  2 g Intravenous Q12H   ??? escitalopram oxalate  5 mg Oral Daily 0900   ??? gabapentin  400 mg Oral TID   ??? heparin  5,000 Units Subcutaneous 3 times per day   ??? lidocaine  1 patch Transdermal Q24H   ??? melatonin  6 mg Oral Nightly (2100)   ??? methadone  30 mg Oral Daily 0900   ??? prazosin  1 mg Oral Nightly (2100)   ??? senna-docusate  2 tablet Oral BID     Continuous Infusions:  PRN Meds:cyclobenzaprine, hydrOXYzine HCl, oxyCODONE **OR** oxyCODONE, polyethylene glycol, traZODone      Vital Signs     Temp:  [97.7 ??F (36.5 ??C)-98.7 ??F (37.1 ??C)] 97.7 ??F (36.5 ??C)  Heart Rate:  [70-78] 77  Resp:  [14-18] 18  BP: (105-125)/(59-73) 109/68    Intake/Output Summary (Last 24 hours) at 08/04/18 1229  Last data filed at 08/04/18 1200   Gross per 24 hour   Intake              480 ml   Output                0 ml   Net              480 ml         Physical Exam     Gen - alert, sitting up in chair next to bed in no acute distress  Eyes - normal conjunctiva, normal pupils  ENT - moist mucosa, normal color  Neck - supple, normal motion  CV - RRR, no MRG, no edema  Lung - clear to auscultation bilaterally, normal work of breathing  Abd - bowel sounds present in all quadrants, soft nontender, nondistended, no organomegaly  MSK -  no joint swelling, no muscle tenderness  Skin - normal temp, no rashes  Neuro - alert, moves all extremities  spontaneously  Psych - normal mood, normal behavior    Laboratory Data         Lab 07/31/18  0353   WBC 4.9   HEMOGLOBIN 11.2*   HEMATOCRIT 33.3*   MEAN CORPUSCULAR VOLUME 83.7   PLATELETS 303           Lab 07/31/18  0353   SODIUM 138   POTASSIUM 4.5   CHLORIDE 97*   CO2 31   BUN 16   CREATININE 0.54*   GLUCOSE 96           Lab 07/31/18  0353   CALCIUM 10.0           Lab 07/30/18  1238 07/30/18  0911 07/29/18  2207 07/29/18  1809   POC GLU MONITORING DEVICE 96 113* 139* 122*  Diagnostic Studies     None new    Assessment & Plan     Anne Goodwin is a 37 y.o. female on hospital day 60.  The medical issues being addressed in today's encounter are as follows:      Infective myositis  Left sacro-ileal joint osteomyelitis  Serratia marcescens bacteremia  Patient will need 4 weeks of IV antibiotics per Infectious Disease  - Continue cefepime. Tentative end date is 08/22/2018  - Safety labs every Monday: ESR, CRP, complete metabolic panel, CBC with differential  - Pain management:   - Scheduled acetaminophen   - Lidocaine patch   - Continue oxycodone 10 mg every 4 hours as needed   - Senna-docusate two tablets twice daily for constipation prophylaxis   - Miralaix BID as needed for constipation   - Continue gabapentin 400 mg TID    Right forearm abscess  Drained by ACS 07/27/2018. No culture sent  - Continue doxycycline for a total of 7 days (day 6/7)    Opioid use disorder  Injects heroin and fentanyl. Has met with Addiction Services  - Continue methadone 30 mg daily, attempting to arrange for pt to go to [facility] for new ID since hers is expired, if able to obtain, can go to methadone clinic on Monday     Anxiety/depression  Psychiatry involved.  - Continue hydroxyzine as needed for anxiety  - Continue escitalopram  - Psychiatry - cognitive behavioral therapy   - Checked EKG for new QTc measurement after starting escitalopram. QTc not significantly changed    Insomnia  PTSD  Patient having difficulty sleeping.  Psychiatry involved.  - Continue melatonin  - Continue prazosin 1 mg at bedtime for PTSD nightmares  - Continue trazodone 25 mg at bedtime as needed for sleep    Type 2 diabetes  HbA1c 6.6%.  - Has been on regular diet and patient's glucose levels have all been at goal for her entire stay.   - Discontinued fingerstick checks. Discussed with patient, and she was ok with this. Can reassess if clinical risks of hyperglycemia change during stay.    Normocytic anemia  May be anemia of chronic inflammation given osteomyelitis/infection  - Treat underlying cause  - Follow up outpatient    DVT PPx: SQH     Diet:   Diet Orders          Diet regular starting at 09/24 1245        Code Status: Full Code    A portion of this note was copied forward from the note written by Dr.Rose on 07/31/18.  I have reviewed and updated the history, physical exam, data, assessment, and plan of the note so that it reflects the evaluation and management of the patient today.    Emeterio Reeve, MD  Department of Internal Medicine  Pager ID [phone])  12:29 PM, 08/04/2018

## 2018-08-04 NOTE — Unmapped (Signed)
RN CM spoke with patient regarding her call to the [facility]. DMV advised her that she will need her birth certificate and social security card as her ID has expired.  Patient has access to her birth certificate but has not had a social security card.    Patient started speaking rapidly about Addictions needing to switch her to split dosing and this would not cover her pain, and that this RN CM does not understand what she is speaking about. RN CM assured her that I am familiar with this process, however this would be a detailed discussion between her as the patient and Addictions.      Patient then jumps to talking about the [facility] clinic that would accept her as a client with an expired ID but she would need to be on Suboxone/Subutex. Then patient states anxiously that she cannot be discharged to the [facility] as she would have to be changed from methadone to suboxone. She states again she is not sure that UC has changed anyone from methadone to suboxone.      RN CM explains that this decision to change from methadone to suboxone/subutex would need to be discussed with Dr. Warden Fillers and Addictions. Advised patient she can explain her reasons for wanting to change and they can explain the process.     RN CM again explained the role of care coordination.  RN CM updated MD in huddle and MD will follow up with patient.      Shelly A. Leavy Cella, RN CM  [address] [place], RN Care Coordinator  [phone] (phone)  [phone] (fax)

## 2018-08-04 NOTE — Unmapped (Signed)
Problem: Acute Pain  Patient's pain progressing toward patient's stated pain goal   Goal: Patients pain is managed to allow active participation in daily activities  Outcome: Progressing  Patient states that pain control has not changed. Plan is to assess pain every 4 hours as well as re-assess every one hour after administration of analgesics.

## 2018-08-04 NOTE — Unmapped (Signed)
RN CM spoke with SW Manager [place] on 10/3. Per Anne Goodwin, Manager Anne Goodwin will have to make the decision if she will arrange transportation for the patient to go to the [facility] on Monday/Tuesday. Anne Goodwin will return from [facility] after vacation on Monday.     Patient to call DMV to determine what other paperwork, if any, is needed for her to renew her expired state ID.    RN CM updated Anne Goodwin/[facility]. Will follow up with Anne Goodwin on Monday.      Shelly A. Leavy Cella, RN CM  [address] [place], RN Care Coordinator  [phone] (phone)  [phone] (fax)

## 2018-08-05 MED ORDER — LORazepam (ATIVAN) tablet 0.5 mg
0.5 | Freq: Every evening | ORAL | Status: AC | PRN
Start: 2018-08-05 — End: 2018-08-10
  Administered 2018-08-06 – 2018-08-10 (×5): 0.5 mg via ORAL

## 2018-08-05 MED ORDER — cloNIDine HCl (CATAPRES) tablet 0.1 mg
0.1 | ORAL | Status: AC | PRN
Start: 2018-08-05 — End: 2018-08-14
  Administered 2018-08-06 – 2018-08-08 (×2): 0.1 mg via ORAL

## 2018-08-05 MED ORDER — gabapentin (NEURONTIN) capsule 600 mg
300 | Freq: Three times a day (TID) | ORAL | Status: AC
Start: 2018-08-05 — End: 2018-08-07
  Administered 2018-08-05 – 2018-08-07 (×6): 600 mg via ORAL

## 2018-08-05 MED ORDER — loperamide (IMODIUM) capsule 2 mg
2 | Freq: Four times a day (QID) | ORAL | Status: AC | PRN
Start: 2018-08-05 — End: 2018-08-14
  Administered 2018-08-07: 01:00:00 2 mg via ORAL

## 2018-08-05 MED ORDER — LORazepam (ATIVAN) tablet 0.25 mg
0.5 | Freq: Two times a day (BID) | ORAL | Status: AC | PRN
Start: 2018-08-05 — End: 2018-08-06
  Administered 2018-08-05 – 2018-08-06 (×4): 0.25 mg via ORAL

## 2018-08-05 MED ORDER — ketorolac (TORADOL) injection 30 mg
30 | Freq: Four times a day (QID) | INTRAMUSCULAR | Status: AC | PRN
Start: 2018-08-05 — End: 2018-08-10
  Administered 2018-08-06 – 2018-08-09 (×6): 30 mg via INTRAVENOUS

## 2018-08-05 MED FILL — HYDROXYZINE HCL 10 MG TABLET: 10 10 MG | ORAL | Qty: 1

## 2018-08-05 MED FILL — PRAZOSIN 1 MG CAPSULE: 1 1 MG | ORAL | Qty: 1

## 2018-08-05 MED FILL — CYCLOBENZAPRINE 5 MG TABLET: 5 5 MG | ORAL | Qty: 1

## 2018-08-05 MED FILL — GABAPENTIN 300 MG CAPSULE: 300 300 MG | ORAL | Qty: 2

## 2018-08-05 MED FILL — LORAZEPAM 0.5 MG TABLET: 0.5 0.5 MG | ORAL | Qty: 1

## 2018-08-05 MED FILL — OXYCODONE 5 MG TABLET: 5 5 MG | ORAL | Qty: 2

## 2018-08-05 MED FILL — MELATONIN 3 MG TABLET: 3 3 mg | ORAL | Qty: 2

## 2018-08-05 MED FILL — KETOROLAC 30 MG/ML (1 ML) INJECTION SOLUTION: 30 30 mg/mL (1 mL) | INTRAMUSCULAR | Qty: 1

## 2018-08-05 MED FILL — TYLENOL 325 MG TABLET: 325 325 mg | ORAL | Qty: 3

## 2018-08-05 MED FILL — SODIUM CHLORIDE 0.9 % INTRAVENOUS PIGGYBACK: 2.00 2.00 g | INTRAVENOUS | Qty: 100

## 2018-08-05 MED FILL — CEFEPIME 2 GRAM SOLUTION FOR INJECTION: 2 2 gram | INTRAMUSCULAR | Qty: 1

## 2018-08-05 MED FILL — LIDODERM 5 % TOPICAL PATCH: 5 5 % | TOPICAL | Qty: 1

## 2018-08-05 MED FILL — HEPARIN (PORCINE) 5,000 UNIT/ML INJECTION SOLUTION: 5000 5,000 unit/mL | INTRAMUSCULAR | Qty: 1

## 2018-08-05 MED FILL — GABAPENTIN 400 MG CAPSULE: 400 400 MG | ORAL | Qty: 1

## 2018-08-05 MED FILL — SENNOSIDES 8.6 MG-DOCUSATE SODIUM 50 MG TABLET: 8.6-50 8.6-50 mg | ORAL | Qty: 2

## 2018-08-05 MED FILL — CLONIDINE HCL 0.1 MG TABLET: 0.1 0.1 MG | ORAL | Qty: 1

## 2018-08-05 MED FILL — TRAZODONE 25 MG DOSE: 50 50 MG | ORAL | Qty: 1

## 2018-08-05 MED FILL — ESCITALOPRAM 10 MG TABLET: 10 10 MG | ORAL | Qty: 1

## 2018-08-05 NOTE — Unmapped (Signed)
Hospital Medicine  Daily Progress Note      Chief Complaint / Reason for Follow-Up     Anne Goodwin is a 37 y.o. female on hospital day 47.  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      Interval History     Want to go ahead and proceed with subutex induction on Monday.  Understands that that means no oxycodone and methadone from here on.  Pain is getting better.     Review of Systems (focused)     No fever, chills, SOB, cough, CP, abdominal pain, N/V, diarrhea or dysuria.       Medications     Scheduled Meds:  ??? acetaminophen  975 mg Oral 3 times per day   ??? ceFEPime (MAXIPIME) IV extended infusion  2 g Intravenous Q12H   ??? escitalopram oxalate  5 mg Oral Daily 0900   ??? gabapentin  600 mg Oral TID   ??? heparin  5,000 Units Subcutaneous 3 times per day   ??? lidocaine  1 patch Transdermal Q24H   ??? melatonin  6 mg Oral Nightly (2100)   ??? prazosin  1 mg Oral Nightly (2100)   ??? senna-docusate  2 tablet Oral BID     Continuous Infusions:  PRN Meds:cloNIDine HCl, cyclobenzaprine, hydrOXYzine HCl, ketorolac, loperamide, LORazepam, LORazepam, polyethylene glycol, traZODone      Vital Signs     Temp:  [97.7 ??F (36.5 ??C)-98.5 ??F (36.9 ??C)] 98.3 ??F (36.8 ??C)  Heart Rate:  [71-92] 92  Resp:  [16-20] 16  BP: (109-124)/(60-82) 121/63    Intake/Output Summary (Last 24 hours) at 08/05/18 1021  Last data filed at 08/04/18 2027   Gross per 24 hour   Intake              880 ml   Output                0 ml   Net              880 ml         Physical Exam     Gen - alert, sitting up in chair next to bed in no acute distress  Eyes - normal conjunctiva, normal pupils  ENT - moist mucosa, normal color  Neck - supple, normal motion  CV - RRR, no MRG, no edema  Lung - clear to auscultation bilaterally, normal work of breathing  Abd - bowel sounds present in all quadrants, soft nontender, nondistended, no organomegaly  MSK -  no joint swelling, no muscle tenderness  Skin - normal temp, no rashes  Neuro -  alert, moves all extremities spontaneously  Psych - normal mood, normal behavior    Laboratory Data         Lab 07/31/18  0353   WBC 4.9   HEMOGLOBIN 11.2*   HEMATOCRIT 33.3*   MEAN CORPUSCULAR VOLUME 83.7   PLATELETS 303           Lab 07/31/18  0353   SODIUM 138   POTASSIUM 4.5   CHLORIDE 97*   CO2 31   BUN 16   CREATININE 0.54*   GLUCOSE 96           Lab 07/31/18  0353   CALCIUM 10.0           Lab 07/30/18  1238 07/30/18  0911 07/29/18  2207 07/29/18  1809   POC GLU MONITORING DEVICE 96 113* 139* 122*  Diagnostic Studies     None new    Assessment & Plan     Anne Goodwin is a 37 y.o. female on hospital day 67.  The medical issues being addressed in today's encounter are as follows:      Infective myositis  Left sacro-ileal joint osteomyelitis  Serratia marcescens bacteremia  Patient will need 4 weeks of IV antibiotics per Infectious Disease  - Continue cefepime. Tentative end date is 08/22/2018  - Safety labs every Monday: ESR, CRP, complete metabolic panel, CBC with differential  - Pain management:   - Scheduled acetaminophen   - Lidocaine patch   - Stopping oxycodone for subutex induction; ordered toradol q6h PRN    - Senna-docusate two tablets twice daily for constipation prophylaxis   - Miralaix BID as needed for constipation   - Increase gabapentin to 600 gm TID     Right forearm abscess  Drained by ACS 07/27/2018. No culture sent  - Completed 7 day course of doxycycline     Opioid use disorder  Injects heroin and fentanyl. Has met with Addiction Services  - holding methadone for subutex induction   - hydroxyzine, clonidine and ativan PRN for anxuiety, agitation and restlessness   - imodium PRN for diarrhea     Anxiety/depression  Psychiatry involved.  - Continue hydroxyzine as needed for anxiety  - Continue escitalopram  - Psychiatry - cognitive behavioral therapy     Insomnia  PTSD  Patient having difficulty sleeping. Psychiatry involved.  - Continue melatonin  - Continue prazosin 1 mg at  bedtime for PTSD nightmares  - Continue trazodone 25 mg at bedtime as needed for sleep    Type 2 diabetes  HbA1c 6.6%.  - Has been on regular diet and patient's glucose levels have all been at goal for her entire stay.   - Discontinued fingerstick checks. Discussed with patient, and she was ok with this. Can reassess if clinical risks of hyperglycemia change during stay.    Normocytic anemia  May be anemia of chronic inflammation given osteomyelitis/infection  - Treat underlying cause  - Follow up outpatient    DVT PPx: SQH     Diet:   Diet Orders          Diet regular starting at 09/24 1245        Code Status: Full Code    A portion of this note was copied forward from the note written by Dr.Rose on 07/31/18.  I have reviewed and updated the history, physical exam, data, assessment, and plan of the note so that it reflects the evaluation and management of the patient today.    Emeterio Reeve, MD  Department of Internal Medicine  [place]ager [place] [phone])  10:21 AM, 08/05/2018

## 2018-08-05 NOTE — Nursing Note (Signed)
Patient refused wound dressing of the hand because she wanted more than 0.25mg  of lorazepam she gets prn. She wants a higher dose. Told her I will let her concerns be known to the doctor but she is closed at the moment. Will also hand over her concerns to the night nurse.

## 2018-08-05 NOTE — Unmapped (Signed)
Nursing Day Shift Progress Note    Significant Events During Shift  Patient refused wound dressing because she wanted a higher dose of lorazepam and I told her it is only the doctor who can adjust the dosage, she was very angry and said she didn't want her dressing changed.    Patient/Family Concerns  Visitors: none  Concerns: pain and anxiety    Assessment  Nursing time demands: {very demanding(moderate)  IV access: right single lumen picc  Sitter requirements:  none    Mental Status  Alert and oriented x4    Medications  [phone] - Medications Not Given  (last 12 hrs)         ** No medications to display **

## 2018-08-05 NOTE — Unmapped (Signed)
Nursing Overnight Progress Note    Significant Events During Shift  Continue to complain L hip pain, PRN given per [facility] with good result.    Patient/Family Concerns  Evening/Overnight Visitors: none  Concerns: none    Assessment  Nursing time demands: moderate  IV access: has IV access, adequate and functioning  Sitter requirements:  no    Mental Status  Mental Status for the past 14 hrs:   Level of Consciousness Orientation Level Cognition   08/04/18 2011 Alert Oriented X4 Ability to abstract       Calls to Physician Team  No data found.      Medications  [phone] - Medications Not Given  (last 12 hrs)         ** SITE UNKNOWN **       Medication Name Action Time Action Reason Comments     heparin (porcine) injection 5,000 Units 08/04/18 2017 Not Given Patient/family refused      heparin (porcine) injection 5,000 Units 08/05/18 0459 Not Given Patient/family refused                 Diet/Meals Consumed (past 24 hours)  Nutrition Assessment for the past 24 hrs:   Diet Type Feeding Percent Meals Eaten (%) Appetite   08/04/18 1200 Regular Able to feed self 100 % Good   08/04/18 2027 Regular - 100 % -

## 2018-08-05 NOTE — Unmapped (Signed)
Problem: Acute Pain  Patient's pain progressing toward patient's stated pain goal   Goal: Patients pain is managed to allow active participation in daily activities  Outcome: Progressing  Patient states that pain scale is always in the upper numbers on a scale of 1-10, pain medications scheduled as well as prn prescribed. Also encouraged patient to change position and deep breath to ease with pain.

## 2018-08-05 NOTE — Unmapped (Signed)
Problem: Acute Pain  Patient's pain progressing toward patient's stated pain goal   Goal: Patient displays improved well-being such as baseline levels for pulse, BP, respirations and relaxed muscle tone or body posture  Outcome: Progressing  Patient not on any Narcotics in preparation for subutex administration. Educated on current PRN medications and their timing. Continue to monitor.

## 2018-08-06 MED ORDER — LORazepam (ATIVAN) tablet 0.5 mg
0.5 | Freq: Two times a day (BID) | ORAL | Status: AC | PRN
Start: 2018-08-06 — End: 2018-08-10
  Administered 2018-08-06 – 2018-08-10 (×10): 0.5 mg via ORAL

## 2018-08-06 MED ORDER — LORazepam (ATIVAN) tablet 0.25 mg
0.5 | Freq: Once | ORAL | Status: AC
Start: 2018-08-06 — End: 2018-08-06
  Administered 2018-08-06: 16:00:00 0.25 mg via ORAL

## 2018-08-06 MED FILL — MELATONIN 3 MG TABLET: 3 3 mg | ORAL | Qty: 2

## 2018-08-06 MED FILL — LORAZEPAM 0.5 MG TABLET: 0.5 0.5 MG | ORAL | Qty: 1

## 2018-08-06 MED FILL — KETOROLAC 30 MG/ML (1 ML) INJECTION SOLUTION: 30 30 mg/mL (1 mL) | INTRAMUSCULAR | Qty: 1

## 2018-08-06 MED FILL — HEPARIN (PORCINE) 5,000 UNIT/ML INJECTION SOLUTION: 5000 5,000 unit/mL | INTRAMUSCULAR | Qty: 1

## 2018-08-06 MED FILL — SODIUM CHLORIDE 0.9 % INTRAVENOUS PIGGYBACK: 2.00 2.00 g | INTRAVENOUS | Qty: 100

## 2018-08-06 MED FILL — TYLENOL 325 MG TABLET: 325 325 mg | ORAL | Qty: 3

## 2018-08-06 MED FILL — GABAPENTIN 300 MG CAPSULE: 300 300 MG | ORAL | Qty: 2

## 2018-08-06 MED FILL — TRAZODONE 25 MG DOSE: 50 50 MG | ORAL | Qty: 1

## 2018-08-06 MED FILL — ESCITALOPRAM 10 MG TABLET: 10 10 MG | ORAL | Qty: 1

## 2018-08-06 MED FILL — SENNOSIDES 8.6 MG-DOCUSATE SODIUM 50 MG TABLET: 8.6-50 8.6-50 mg | ORAL | Qty: 2

## 2018-08-06 MED FILL — LOPERAMIDE 2 MG CAPSULE: 2 2 mg | ORAL | Qty: 1

## 2018-08-06 MED FILL — LIDODERM 5 % TOPICAL PATCH: 5 5 % | TOPICAL | Qty: 1

## 2018-08-06 MED FILL — HYDROXYZINE HCL 10 MG TABLET: 10 10 MG | ORAL | Qty: 1

## 2018-08-06 MED FILL — CEFEPIME 2 GRAM SOLUTION FOR INJECTION: 2 2 gram | INTRAMUSCULAR | Qty: 1

## 2018-08-06 MED FILL — CYCLOBENZAPRINE 5 MG TABLET: 5 5 MG | ORAL | Qty: 1

## 2018-08-06 MED FILL — PRAZOSIN 1 MG CAPSULE: 1 1 MG | ORAL | Qty: 1

## 2018-08-06 MED FILL — CLONIDINE HCL 0.1 MG TABLET: 0.1 0.1 MG | ORAL | Qty: 1

## 2018-08-06 NOTE — Unmapped (Signed)
Nursing Overnight Progress Note    Significant Events During Shift  None.    Patient/Family Concerns  Evening/Overnight Visitors: none  Concerns: none    Assessment  Nursing time demands: low  IV access: has IV access, adequate and functioning  Sitter requirements:  no    Mental Status  Mental Status for the past 14 hrs:   Level of Consciousness Orientation Level Cognition   08/05/18 2000 Alert Oriented X4 Ability to abstract       Calls to Physician Team  No data found.      Medications  [phone] - Medications Not Given  (last 12 hrs)         ** SITE UNKNOWN **       Medication Name Action Time Action Reason Comments     acetaminophen (TYLENOL) tablet 975 mg 08/06/18 8119 Not Given Patient/family refused      heparin (porcine) injection 5,000 Units 08/05/18 2011 Not Given Patient/family refused      heparin (porcine) injection 5,000 Units 08/06/18 0423 Not Given Patient/family refused                 Diet/Meals Consumed (past 24 hours)  No data found.

## 2018-08-06 NOTE — Unmapped (Signed)
Nursing Day Shift Progress Note    Significant Events During Shift  Patient states that there is now progress in pain management, pain has reduced according to her    Patient/Family Concerns  Visitors: none  Concerns: pain control    Assessment  Nursing time demands: {moderate  IV access: right single lumen picc  Sitter requirements:  none    Mental Status  Mental Status for the past 14 hrs:   Level of Consciousness Orientation Level Cognition   08/06/18 0900 Alert Oriented X4 Appropriate judgement       Medications  [phone] - Medications Not Given  (last 12 hrs)         ** No medications to display **

## 2018-08-06 NOTE — Unmapped (Signed)
Hospital Medicine  Daily Progress Note      Chief Complaint / Reason for Follow-Up     Anne Goodwin is a 37 y.o. female on hospital day 73.  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      Interval History     Having withdrawal sx including goose bumps, yawning, hot flashes, runny nose and abdominal cramping.  Pain isn't too bad.  Currently 7/10.  Ativan helping with withdrawal, however feels like she needs a higher dose.     Review of Systems (focused)     No fever, chills, SOB, cough, CP, abdominal pain, N/V, diarrhea or dysuria.       Medications     Scheduled Meds:  ??? acetaminophen  975 mg Oral 3 times per day   ??? ceFEPime (MAXIPIME) IV extended infusion  2 g Intravenous Q12H   ??? escitalopram oxalate  5 mg Oral Daily 0900   ??? gabapentin  600 mg Oral TID   ??? heparin  5,000 Units Subcutaneous 3 times per day   ??? lidocaine  1 patch Transdermal Q24H   ??? melatonin  6 mg Oral Nightly (2100)   ??? prazosin  1 mg Oral Nightly (2100)   ??? senna-docusate  2 tablet Oral BID     Continuous Infusions:  PRN Meds:cloNIDine HCl, cyclobenzaprine, hydrOXYzine HCl, ketorolac, loperamide, LORazepam, LORazepam, polyethylene glycol, traZODone      Vital Signs     Temp:  [97.7 ??F (36.5 ??C)-98.3 ??F (36.8 ??C)] 98.1 ??F (36.7 ??C)  Heart Rate:  [78-92] 92  Resp:  [16-20] 18  BP: (116-156)/(71-94) 149/94  No intake or output data in the 24 hours ending 08/06/18 1302      Physical Exam     Gen - alert, sitting up in chair next to bed in no acute distress  Eyes - normal conjunctiva, normal pupils  ENT - moist mucosa, normal color  Neck - supple, normal motion  CV - RRR, no MRG, no edema  Lung - clear to auscultation bilaterally, normal work of breathing  Abd - bowel sounds present in all quadrants, soft nontender, nondistended, no organomegaly  MSK -  no joint swelling, no muscle tenderness  Skin - normal temp, no rashes  Neuro - alert, moves all extremities spontaneously  Psych - normal mood, normal  behavior    Laboratory Data         Lab 07/31/18  0353   WBC 4.9   HEMOGLOBIN 11.2*   HEMATOCRIT 33.3*   MEAN CORPUSCULAR VOLUME 83.7   PLATELETS 303           Lab 07/31/18  0353   SODIUM 138   POTASSIUM 4.5   CHLORIDE 97*   CO2 31   BUN 16   CREATININE 0.54*   GLUCOSE 96           Lab 07/31/18  0353   CALCIUM 10.0             Diagnostic Studies     None new    Assessment & Plan     Anne Goodwin is a 37 y.o. female on hospital day 60.  The medical issues being addressed in today's encounter are as follows:      Infective myositis  Left sacro-ileal joint osteomyelitis  Serratia marcescens bacteremia  Patient will need 4 weeks of IV antibiotics per Infectious Disease  - Continue cefepime. Tentative end date is 08/22/2018  - Safety labs every Monday: ESR,  CRP, complete metabolic panel, CBC with differential  - Pain management:   - Scheduled acetaminophen   - Lidocaine patch   - Stopped oxycodone for subutex induction; ordered toradol q6h PRN    - Senna-docusate two tablets twice daily for constipation prophylaxis   - Miralaix BID as needed for constipation   - Increased gabapentin to 600 gm TID     Right forearm abscess  Drained by ACS 07/27/2018. No culture sent  - Completed 7 day course of doxycycline     Opioid use disorder  Injects heroin and fentanyl. Has met with Addiction Services  - holding methadone for subutex induction   - hydroxyzine, clonidine and ativan PRN for anxuiety, agitation and restlessness; increase ativan to 0.5 mg BID PRN   - imodium PRN for diarrhea     Anxiety/depression  Psychiatry involved.  - Continue hydroxyzine as needed for anxiety  - Continue escitalopram  - Psychiatry - cognitive behavioral therapy     Insomnia  PTSD  Patient having difficulty sleeping. Psychiatry involved.  - Continue melatonin  - Continue prazosin 1 mg at bedtime for PTSD nightmares  - Continue trazodone 25 mg at bedtime as needed for sleep    Type 2 diabetes  HbA1c 6.6%.  - Has been on regular diet and  patient's glucose levels have all been at goal for her entire stay.   - Discontinued fingerstick checks. Discussed with patient, and she was ok with this. Can reassess if clinical risks of hyperglycemia change during stay.    Normocytic anemia  May be anemia of chronic inflammation given osteomyelitis/infection  - Treat underlying cause  - Follow up outpatient    DVT PPx: SQH     Diet:   Diet Orders          Diet regular starting at 09/24 1245        Code Status: Full Code    A portion of this note was copied forward from the note written by Dr.Rose on 07/31/18.  I have reviewed and updated the history, physical exam, data, assessment, and plan of the note so that it reflects the evaluation and management of the patient today.    Emeterio Reeve, MD  Department of Internal Medicine  Pager ID [phone])  1:02 PM, 08/06/2018

## 2018-08-06 NOTE — Unmapped (Signed)
Problem: Acute Pain  Patient's pain progressing toward patient's stated pain goal   Goal: Patient verbalizes a reduction in pain level  Outcome: Progressing  Patient states that there has been change in the pain level but not significant

## 2018-08-07 LAB — SED RATE
Sed Rate: 29 mm/hr (ref 0–20)
Sed Rate: 33 mm/hr (ref 0–20)

## 2018-08-07 LAB — CBC
Hematocrit: 32.9 % (ref 35.0–45.0)
Hematocrit: 32.9 % (ref 35.0–45.0)
Hemoglobin: 11.1 g/dL (ref 11.7–15.5)
Hemoglobin: 11.1 g/dL (ref 11.7–15.5)
MCH: 28.1 pg (ref 27.0–33.0)
MCH: 28.3 pg (ref 27.0–33.0)
MCHC: 33.6 g/dL (ref 32.0–36.0)
MCHC: 33.8 g/dL (ref 32.0–36.0)
MCV: 83.6 fL (ref 80.0–100.0)
MCV: 83.7 fL (ref 80.0–100.0)
MPV: 8.3 fL (ref 7.5–11.5)
MPV: 8.4 fL (ref 7.5–11.5)
Platelets: 198 10*3/uL (ref 140–400)
Platelets: 201 10*3/uL (ref 140–400)
RBC: 3.93 10*6/uL (ref 3.80–5.10)
RBC: 3.93 10*6/uL (ref 3.80–5.10)
RDW: 15.5 % (ref 11.0–15.0)
RDW: 15.7 % (ref 11.0–15.0)
WBC: 4.7 10*3/uL (ref 3.8–10.8)
WBC: 4.7 10*3/uL (ref 3.8–10.8)

## 2018-08-07 LAB — COMPREHENSIVE METABOLIC PANEL
ALT: 11 U/L (ref 7–52)
AST: 14 U/L (ref 13–39)
Albumin: 3.8 g/dL (ref 3.5–5.7)
Alkaline Phosphatase: 51 U/L (ref 36–125)
Anion Gap: 9 mmol/L (ref 3–16)
BUN: 22 mg/dL (ref 7–25)
CO2: 30 mmol/L (ref 21–33)
Calcium: 10.1 mg/dL (ref 8.6–10.3)
Chloride: 101 mmol/L (ref 98–110)
Creatinine: 0.5 mg/dL (ref 0.60–1.30)
Glucose: 104 mg/dL (ref 70–100)
Osmolality, Calculated: 294 mOsm/kg (ref 278–305)
Potassium: 4.3 mmol/L (ref 3.5–5.3)
Sodium: 140 mmol/L (ref 133–146)
Total Bilirubin: 0.3 mg/dL (ref 0.0–1.5)
Total Protein: 7.6 g/dL (ref 6.4–8.9)
eGFR AA CKD-EPI: >90 See note.
eGFR NONAA CKD-EPI: >90 See note.

## 2018-08-07 LAB — DIFFERENTIAL
Basophils Absolute: 24 /uL (ref 0–200)
Basophils Relative: 0.5 % (ref 0.0–1.0)
Eosinophils Absolute: 99 /uL (ref 15–500)
Eosinophils Relative: 2.1 % (ref 0.0–8.0)
Lymphocytes Absolute: 2402 /uL (ref 850–3900)
Lymphocytes Relative: 51.1 % (ref 15.0–45.0)
Monocytes Absolute: 338 /uL (ref 200–950)
Monocytes Relative: 7.2 % (ref 0.0–12.0)
Neutrophils Absolute: 1838 /uL (ref 1500–7800)
Neutrophils Relative: 39.1 % (ref 40.0–80.0)
nRBC: 0 /100 WBC (ref 0–0)

## 2018-08-07 LAB — C-REACTIVE PROTEIN
CRP: 2.2 mg/L (ref 1.0–10.0)
CRP: 2.3 mg/L (ref 1.0–10.0)

## 2018-08-07 LAB — BASIC METABOLIC PANEL
Anion Gap: 9 mmol/L (ref 3–16)
BUN: 22 mg/dL (ref 7–25)
CO2: 30 mmol/L (ref 21–33)
Calcium: 10.1 mg/dL (ref 8.6–10.3)
Chloride: 101 mmol/L (ref 98–110)
Creatinine: 0.5 mg/dL (ref 0.60–1.30)
Glucose: 104 mg/dL (ref 70–100)
Osmolality, Calculated: 294 mOsm/kg (ref 278–305)
Potassium: 4.3 mmol/L (ref 3.5–5.3)
Sodium: 140 mmol/L (ref 133–146)
eGFR AA CKD-EPI: >90 See note.
eGFR NONAA CKD-EPI: >90 See note.

## 2018-08-07 MED ORDER — buprenorphine HCl (SUBUTEX) SL tablet 4 mg
2 | Freq: Once | SUBLINGUAL | Status: AC | PRN
Start: 2018-08-07 — End: 2018-08-07

## 2018-08-07 MED ORDER — gabapentin (NEURONTIN) capsule 800 mg
400 | Freq: Three times a day (TID) | ORAL | Status: AC
Start: 2018-08-07 — End: 2018-08-14
  Administered 2018-08-07 – 2018-08-14 (×21): 800 mg via ORAL

## 2018-08-07 MED ORDER — naloxone (NARCAN) injection 0.4 mg
0.4 | INTRAMUSCULAR | Status: AC | PRN
Start: 2018-08-07 — End: 2018-08-14

## 2018-08-07 MED ORDER — gabapentin (NEURONTIN) capsule 200 mg
100 | Freq: Once | ORAL | Status: AC
Start: 2018-08-07 — End: 2018-08-07
  Administered 2018-08-07: 14:00:00 200 mg via ORAL

## 2018-08-07 MED ORDER — buprenorphine HCl (SUBUTEX) SL tablet 4 mg
2 | Freq: Once | SUBLINGUAL | Status: AC | PRN
Start: 2018-08-07 — End: 2018-08-07
  Administered 2018-08-07: 17:00:00 4 mg via SUBLINGUAL

## 2018-08-07 MED ORDER — LORazepam (ATIVAN) tablet 0.5 mg
0.5 | Freq: Once | ORAL | Status: AC
Start: 2018-08-07 — End: 2018-08-07
  Administered 2018-08-07: 14:00:00 0.5 mg via ORAL

## 2018-08-07 MED ORDER — buprenorphine HCl (SUBUTEX) SL tablet 4 mg
2 | Freq: Once | SUBLINGUAL | Status: AC | PRN
Start: 2018-08-07 — End: 2018-08-07
  Administered 2018-08-07: 14:00:00 4 mg via SUBLINGUAL

## 2018-08-07 MED ORDER — ondansetron (ZOFRAN) tablet 4 mg
4 | Freq: Four times a day (QID) | ORAL | Status: AC | PRN
Start: 2018-08-07 — End: 2018-08-11

## 2018-08-07 MED FILL — HEPARIN (PORCINE) 5,000 UNIT/ML INJECTION SOLUTION: 5000 5,000 unit/mL | INTRAMUSCULAR | Qty: 1

## 2018-08-07 MED FILL — PRAZOSIN 1 MG CAPSULE: 1 1 MG | ORAL | Qty: 1

## 2018-08-07 MED FILL — LORAZEPAM 0.5 MG TABLET: 0.5 0.5 MG | ORAL | Qty: 1

## 2018-08-07 MED FILL — SODIUM CHLORIDE 0.9 % INTRAVENOUS PIGGYBACK: 2.00 2.00 g | INTRAVENOUS | Qty: 100

## 2018-08-07 MED FILL — GABAPENTIN 100 MG CAPSULE: 100 100 MG | ORAL | Qty: 2

## 2018-08-07 MED FILL — SENNOSIDES 8.6 MG-DOCUSATE SODIUM 50 MG TABLET: 8.6-50 8.6-50 mg | ORAL | Qty: 2

## 2018-08-07 MED FILL — TYLENOL 325 MG TABLET: 325 325 mg | ORAL | Qty: 3

## 2018-08-07 MED FILL — CYCLOBENZAPRINE 5 MG TABLET: 5 5 MG | ORAL | Qty: 1

## 2018-08-07 MED FILL — GABAPENTIN 300 MG CAPSULE: 300 300 MG | ORAL | Qty: 2

## 2018-08-07 MED FILL — GABAPENTIN 400 MG CAPSULE: 400 400 MG | ORAL | Qty: 2

## 2018-08-07 MED FILL — CEFEPIME 2 GRAM SOLUTION FOR INJECTION: 2 2 gram | INTRAMUSCULAR | Qty: 1

## 2018-08-07 MED FILL — HYDROXYZINE HCL 10 MG TABLET: 10 10 MG | ORAL | Qty: 1

## 2018-08-07 MED FILL — ESCITALOPRAM 10 MG TABLET: 10 10 MG | ORAL | Qty: 1

## 2018-08-07 MED FILL — MELATONIN 3 MG TABLET: 3 3 mg | ORAL | Qty: 2

## 2018-08-07 MED FILL — BUPRENORPHINE HCL 2 MG SUBLINGUAL TABLET: 2 2 mg | SUBLINGUAL | Qty: 2

## 2018-08-07 MED FILL — KETOROLAC 30 MG/ML (1 ML) INJECTION SOLUTION: 30 30 mg/mL (1 mL) | INTRAMUSCULAR | Qty: 1

## 2018-08-07 MED FILL — TRAZODONE 25 MG DOSE: 50 50 MG | ORAL | Qty: 1

## 2018-08-07 NOTE — Unmapped (Signed)
Hospital Medicine  Daily Progress Note      Chief Complaint / Reason for Follow-Up     Anne Goodwin is a 37 y.o. female on hospital day 30.  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      Interval History     Having worsening withdrawal sx including goose bumps, yawning, hot flashes, runny nose and abdominal cramping.  Ativan helping with withdrawal.     Review of Systems (focused)     No fever, chills, SOB, cough, CP, abdominal pain, N/V, diarrhea or dysuria.       Medications     Scheduled Meds:  ??? acetaminophen  975 mg Oral 3 times per day   ??? ceFEPime (MAXIPIME) IV extended infusion  2 g Intravenous Q12H   ??? escitalopram oxalate  5 mg Oral Daily 0900   ??? gabapentin  800 mg Oral TID   ??? heparin  5,000 Units Subcutaneous 3 times per day   ??? lidocaine  1 patch Transdermal Q24H   ??? melatonin  6 mg Oral Nightly (2100)   ??? prazosin  1 mg Oral Nightly (2100)   ??? senna-docusate  2 tablet Oral BID     Continuous Infusions:  PRN Meds:cloNIDine HCl, cyclobenzaprine, hydrOXYzine HCl, ketorolac, loperamide, LORazepam, LORazepam, naloxone, ondansetron, polyethylene glycol, traZODone      Vital Signs     Temp:  [97.7 ??F (36.5 ??C)-98.8 ??F (37.1 ??C)] 98.8 ??F (37.1 ??C)  Heart Rate:  [79-102] 102  Resp:  [16-18] 18  BP: (122-155)/(73-97) 122/73    Intake/Output Summary (Last 24 hours) at 08/07/18 1550  Last data filed at 08/06/18 2000   Gross per 24 hour   Intake              230 ml   Output                0 ml   Net              230 ml         Physical Exam     Gen - alert, sitting up in chair next to bed in no acute distress  Eyes - normal conjunctiva, normal pupils  ENT - moist mucosa, normal color  Neck - supple, normal motion  CV - RRR, no MRG, no edema  Lung - clear to auscultation bilaterally, normal work of breathing  Abd - bowel sounds present in all quadrants, soft nontender, nondistended, no organomegaly  MSK -  no joint swelling, no muscle tenderness  Skin - normal temp, no  rashes  Neuro - alert, moves all extremities spontaneously  Psych - normal mood, normal behavior    Laboratory Data         Lab 08/07/18  0442   WBC 4.7   4.7   HEMOGLOBIN 11.1*   11.1*   HEMATOCRIT 32.9*   32.9*   MEAN CORPUSCULAR VOLUME 83.7   83.6   PLATELETS 198   201           Lab 08/07/18  0442   SODIUM 140   140   POTASSIUM 4.3   4.3   CHLORIDE 101   101   CO2 30   30   BUN 22   22   CREATININE 0.50*   0.50*   GLUCOSE 104*   104*           Lab 08/07/18  0442   CALCIUM 10.1   10.1  Diagnostic Studies     None new    Assessment & Plan     Anne Goodwin is a 37 y.o. female on hospital day 83.  The medical issues being addressed in today's encounter are as follows:      Infective myositis  Left sacro-ileal joint osteomyelitis  Serratia marcescens bacteremia  Patient will need 4 weeks of IV antibiotics per Infectious Disease  - Continue cefepime. Tentative end date is 08/22/2018  - Safety labs every Monday: ESR, CRP, complete metabolic panel, CBC with differential  - Pain management:   - Scheduled acetaminophen   - Lidocaine patch   - Stopped oxycodone for subutex induction; ordered toradol q6h PRN    - Senna-docusate two tablets twice daily for constipation prophylaxis   - Miralaix BID as needed for constipation   - Increased gabapentin to 600 gm TID     Right forearm abscess  Drained by ACS 07/27/2018. No culture sent  - Completed 7 day course of doxycycline     Opioid use disorder  Injects heroin and fentanyl. Has met with Addiction Services  - starting subutex induction today  - hydroxyzine, clonidine and ativan PRN for anxuiety, agitation and restlessness;   - imodium PRN for diarrhea     Anxiety/depression  Psychiatry involved.  - Continue hydroxyzine as needed for anxiety  - Continue escitalopram  - Psychiatry - cognitive behavioral therapy     Insomnia  PTSD  Patient having difficulty sleeping. Psychiatry involved.  - Continue melatonin  - Continue prazosin 1 mg at bedtime for PTSD  nightmares  - Continue trazodone 25 mg at bedtime as needed for sleep    Type 2 diabetes  HbA1c 6.6%.  - Has been on regular diet and patient's glucose levels have all been at goal for her entire stay.   - Discontinued fingerstick checks. Discussed with patient, and she was ok with this. Can reassess if clinical risks of hyperglycemia change during stay.    Normocytic anemia  May be anemia of chronic inflammation given osteomyelitis/infection  - Treat underlying cause  - Follow up outpatient    DVT PPx: SQH     Diet:   Diet Orders          Diet regular starting at 09/24 1245        Code Status: Full Code    A portion of this note was copied forward from the note written by Dr.Rose on 07/31/18.  I have reviewed and updated the history, physical exam, data, assessment, and plan of the note so that it reflects the evaluation and management of the patient today.    Emeterio Reeve, MD  Department of Internal Medicine  Pager ID [phone])  3:50 PM, 08/07/2018

## 2018-08-07 NOTE — Unmapped (Signed)
INFECTIOUS DISEASE PROGRESS NOTE    08/07/2018  9:16 PM    Anne Goodwin is a 37 y.o. female patient. Hospital Day: 15     Chief Complaint/Reason for Follow-up: Myositis    Subjective:    Still with left sacro-iliac /gluteal pain, but much improved  No fevers or chills  No chest pain      Review of Systems   Constitutional: Positive for fatigue. Negative for chills and fever.   HENT: Negative for mouth sores.    Respiratory: Negative for shortness of breath.    Cardiovascular: Negative for chest pain and leg swelling.   Gastrointestinal: Negative for abdominal pain.   Genitourinary: Negative for dysuria.   Musculoskeletal: Positive for back pain and myalgias.       Objective:  Vital signs:  Vitals:    08/07/18 2025   BP: 121/78   Pulse: 83   Resp: 18   Temp: 98.2 ??F (36.8 ??C)   SpO2: 100%     Temp last 24 hours:Temp (24hrs), Avg:98.5 ??F (36.9 ??C), Min:98.2 ??F (36.8 ??C), Max:98.8 ??F (37.1 ??C)    Scheduled Meds:  ??? acetaminophen  975 mg Oral 3 times per day   ??? ceFEPime (MAXIPIME) IV extended infusion  2 g Intravenous Q12H   ??? escitalopram oxalate  5 mg Oral Daily 0900   ??? gabapentin  800 mg Oral TID   ??? heparin  5,000 Units Subcutaneous 3 times per day   ??? lidocaine  1 patch Transdermal Q24H   ??? melatonin  6 mg Oral Nightly (2100)   ??? prazosin  1 mg Oral Nightly (2100)   ??? senna-docusate  2 tablet Oral BID     Continuous Infusions:  PRN Meds:cloNIDine HCl, cyclobenzaprine, hydrOXYzine HCl, ketorolac, loperamide, LORazepam, LORazepam, naloxone, ondansetron, polyethylene glycol, traZODone    Intake/Output last 3 shifts:    Date 08/06/18 1500 - 08/07/18 0659 08/07/18 0700 - 08/08/18 0659   Shift 1500-2259 2300-0659 24 Hour Total 0700-1459 1500-2259 2300-0659 24 Hour Total   I  N  T  A  K  E   P.O. 230  230          P.O. 230  230        IV Piggyback 100  100          Volume (mL) (cefepime (MAXIPIME) 2 g in sodium chloride 0.9% 100 mL IVPB ADDaptor) 100  100        Shift Total  (mL/kg) 330  (5.6)  330  (5.6)        O  U  T  P  U  T   Urine  (mL/kg/hr)     800  800      Urine     800  800      Urine Occurrence 1 x  1 x        Shift Total  (mL/kg)     800  (13.6)  800  (13.6)   Weight (kg) 59 59 59 59 59 59 59       Physical Exam   Nursing note and vitals reviewed.  Constitutional: No distress.   HENT:   Mouth/Throat: No oropharyngeal exudate.   Eyes: Pupils are equal, round, and reactive to light. No scleral icterus.   Neck: Neck supple. No JVD present.   Cardiovascular: Normal rate, regular rhythm, normal heart sounds and intact distal pulses.    Pulmonary/Chest: Effort normal and breath sounds normal. She has no rales.  Abdominal: Soft. Bowel sounds are normal. There is no tenderness.   Musculoskeletal: She exhibits no edema.   Tender left SI paraspinal region with radiation to left buttock - improved       Labs:       Lab 08/07/18  0442   WBC 4.7   4.7   HEMOGLOBIN 11.1*   11.1*   HEMATOCRIT 32.9*   32.9*   MEAN CORPUSCULAR VOLUME 83.7   83.6   PLATELETS 198   201        Lab 08/07/18  0442   SODIUM 140   140   POTASSIUM 4.3   4.3   CHLORIDE 101   101   CO2 30   30   BUN 22   22   CREATININE 0.50*   0.50*   GLUCOSE 104*   104*   CALCIUM 10.1   10.1          Lab 08/07/18  0442   ALK PHOS 51   AST 14   ALT 11   BILIRUBIN TOTAL 0.3             Results for Anne, Goodwin (MRN [id number]) as of 08/07/2018 21:18   Ref. Range 07/23/2018 09:59 07/31/2018 03:53 08/07/2018 04:42 08/07/2018 04:42   CRP Latest Ref Range: 1.0 - 10.0 mg/L 86.3 (H) 4.8 2.2 2.3   Results for Anne, Goodwin (MRN [id number]) as of 08/07/2018 21:18   Ref. Range 07/23/2018 09:59 07/31/2018 03:53 08/07/2018 04:42 08/07/2018 04:42   Sed Rate by Modified Westergren Latest Ref Range: 0 - 20 mm/hr 75 (H) 27 (H) 29 (H) 33 (H)       Assessment/Plan:  Principal Problem:    Infective myositis    Septic arthritis/myositis left   Prior serratia bacteremia   Responding to therapy   Planned for IV therapy to 10/22  But given improvement will consider IV therapy for  additional week then conversion to PO cipro    Substance abuse    Followed by addiction services      Arbie Cookey, MD  08/07/2018  Pager [phone]

## 2018-08-07 NOTE — Unmapped (Signed)
Problem: Acute Pain  Patient's pain progressing toward patient's stated pain goal   Goal: Patient displays improved well-being such as baseline levels for pulse, BP, respirations and relaxed muscle tone or body posture  Outcome: Progressing  Pt with c/o pain rated pain on pain scale 10/10 as 7/10 to left hip. RN administered prn toradol 30 mg ivp to pt. Reassessed, pt resting in bed. Will continue to monitor.[email or web address]

## 2018-08-07 NOTE — Unmapped (Signed)
Problem: Acute Pain  Patient's pain progressing toward patient's stated pain goal   Goal: Patient verbalizes a reduction in pain level  Outcome: Progressing  According to patient there has been a significant reduction in pain now as compared to when she was admitted

## 2018-08-07 NOTE — Unmapped (Signed)
[facility]   Medical Nutrition Therapy  Follow-Up    Diet Order/Nutrition Support: Regular    Pertinent Information: Pt is a 37 y.o.??female??being admitted to the hospital for infective myositis.  Pt experiencing withdrawal symptoms. Abx end date 10/22.  Pt was in bed resting being quietly. Reports a good appetite and tolerance of meals. Po intakes are documeneted as 100%. Denies nausea, +BM.     Scheduled Meds:   ??? acetaminophen  975 mg Oral 3 times per day   ??? ceFEPime (MAXIPIME) IV extended infusion  2 g Intravenous Q12H   ??? escitalopram oxalate  5 mg Oral Daily 0900   ??? gabapentin  800 mg Oral TID   ??? heparin  5,000 Units Subcutaneous 3 times per day   ??? lidocaine  1 patch Transdermal Q24H   ??? melatonin  6 mg Oral Nightly (2100)   ??? prazosin  1 mg Oral Nightly (2100)   ??? senna-docusate  2 tablet Oral BID      Continuous Infusions:   PRN Meds:cloNIDine HCl, cyclobenzaprine, hydrOXYzine HCl, ketorolac, loperamide, LORazepam, LORazepam, naloxone, ondansetron, polyethylene glycol, traZODone     Pertinent Labs:   Lab Results   Component Value Date    GLUCOSE 104 (H) 08/07/2018    BUN 22 08/07/2018    CO2 30 08/07/2018    CREATININE 0.50 (L) 08/07/2018    K 4.3 08/07/2018    NA 140 08/07/2018    CL 101 08/07/2018    CALCIUM 10.1 08/07/2018    ALBUMIN 3.8 08/07/2018    PROT 7.6 08/07/2018    ALKPHOS 51 08/07/2018    ALT 11 08/07/2018    AST 14 08/07/2018    BILITOT 0.3 08/07/2018     Lab Results   Component Value Date    MG 2.0 07/28/2018     Established Nutrition Diagnosis  Nutrition Diagnosis : No Nutrition Diagnosis    Established Nutrition Intervention: Not Applicable    Established Goals: Not Applicable    Goals: Not Applicable    Nutrition Status Classification: Mildly Compromised    Nutrition Discharge Planning: Will continue to monitor the POC for nutritional needs prior to discharge.    Follow up per inpatient nutrition protocol.    Additional Recommendation(s) to Physicians:      Continue to monitor po intakes and POC.    Sharene Skeans MS, RD, LD  [phone]

## 2018-08-07 NOTE — Nursing Note (Signed)
Pt stated she did not feel well and did not want to talk to anyone this morning when I went in for labs, vitals, and antibiotic. Pt also asked when the doctor would be in.

## 2018-08-07 NOTE — Unmapped (Signed)
Problem: Acute Pain  Patient's pain progressing toward patient's stated pain goal   Goal: Patients pain is managed to allow active participation in daily activities  Outcome: Progressing

## 2018-08-07 NOTE — Unmapped (Signed)
Nursing Day Shift Progress Note    Significant Events During Shift  This morning around 8:30 am, I went into patient's room to give medication, she wanted a dose of lorazepam but per order I could only give her that medication in about hours which I told her, she became very agitated and refused every care except for her other meds, reported to Dr Warden Fillers and she gave a once dose of lorazepam and addiction team also put in subutex which I started as a prn medication.    She refused her dressing change on her right elbow because patient stated that wound is healing, she needed only mepilex border to cover up which I gave to her.    Patient/Family Concerns  Visitors: A female relative came in to deliver books to her  Concerns: withdrawal    Assessment  Nursing time demands:moderate  IV access: right single lumen picc  Sitter requirements:  none    Mental Status  Mental Status for the past 14 hrs:   Level of Consciousness Orientation Level Cognition   08/07/18 0800 Alert Oriented X4 Appropriate judgement       Medications  [phone] - Medications Not Given  (last 12 hrs)         ** SITE UNKNOWN **       Medication Name Action Time Action Reason Comments     heparin (porcine) injection 5,000 Units 08/07/18 1305 Not Given Patient/family refused

## 2018-08-08 MED ORDER — buprenorphine HCl (SUBUTEX) SL tablet 4 mg
2 | Freq: Two times a day (BID) | SUBLINGUAL | Status: AC | PRN
Start: 2018-08-08 — End: 2018-08-09
  Administered 2018-08-08: 18:00:00 4 mg via SUBLINGUAL

## 2018-08-08 MED ORDER — buprenorphine HCl (SUBUTEX) sl tablet 8 mg
8 | Freq: Once | SUBLINGUAL | Status: AC
Start: 2018-08-08 — End: 2018-08-08
  Administered 2018-08-08: 14:00:00 8 mg via SUBLINGUAL

## 2018-08-08 MED FILL — LORAZEPAM 0.5 MG TABLET: 0.5 0.5 MG | ORAL | Qty: 1

## 2018-08-08 MED FILL — HYDROXYZINE HCL 10 MG TABLET: 10 10 MG | ORAL | Qty: 1

## 2018-08-08 MED FILL — KETOROLAC 30 MG/ML (1 ML) INJECTION SOLUTION: 30 30 mg/mL (1 mL) | INTRAMUSCULAR | Qty: 1

## 2018-08-08 MED FILL — CLONIDINE HCL 0.1 MG TABLET: 0.1 0.1 MG | ORAL | Qty: 1

## 2018-08-08 MED FILL — BUPRENORPHINE HCL 8 MG SUBLINGUAL TABLET: 8 8 mg | SUBLINGUAL | Qty: 1

## 2018-08-08 MED FILL — SENNOSIDES 8.6 MG-DOCUSATE SODIUM 50 MG TABLET: 8.6-50 8.6-50 mg | ORAL | Qty: 2

## 2018-08-08 MED FILL — GABAPENTIN 400 MG CAPSULE: 400 400 MG | ORAL | Qty: 2

## 2018-08-08 MED FILL — TYLENOL 325 MG TABLET: 325 325 mg | ORAL | Qty: 3

## 2018-08-08 MED FILL — CYCLOBENZAPRINE 5 MG TABLET: 5 5 MG | ORAL | Qty: 1

## 2018-08-08 MED FILL — SODIUM CHLORIDE 0.9 % INTRAVENOUS PIGGYBACK: 2.00 2.00 g | INTRAVENOUS | Qty: 100

## 2018-08-08 MED FILL — BUPRENORPHINE HCL 2 MG SUBLINGUAL TABLET: 2 2 mg | SUBLINGUAL | Qty: 2

## 2018-08-08 MED FILL — PRAZOSIN 1 MG CAPSULE: 1 1 MG | ORAL | Qty: 1

## 2018-08-08 MED FILL — CEFEPIME 2 GRAM SOLUTION FOR INJECTION: 2 2 gram | INTRAMUSCULAR | Qty: 1

## 2018-08-08 MED FILL — MELATONIN 3 MG TABLET: 3 3 mg | ORAL | Qty: 2

## 2018-08-08 MED FILL — HEPARIN (PORCINE) 5,000 UNIT/ML INJECTION SOLUTION: 5000 5,000 unit/mL | INTRAMUSCULAR | Qty: 1

## 2018-08-08 MED FILL — ESCITALOPRAM 10 MG TABLET: 10 10 MG | ORAL | Qty: 1

## 2018-08-08 NOTE — Unmapped (Signed)
[facility]  DEPARTMENT OF INTERNAL MEDICINE  DAILY PROGRESS NOTE    Chief Complaint / Reason for Follow-Up     Anne Goodwin is a 37 y.o. female on hospital day 62.  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    Interval History / Subjective     Patient tearful on examination, complains of terrible anxiety, uncomfortable, and having worsening withdrawal symptoms    Review of Systems     Review of Systems   Constitutional: Negative for chills and fever.   Respiratory: Negative for cough, shortness of breath and wheezing.    Cardiovascular: Negative for chest pain and palpitations.   Gastrointestinal: Negative for abdominal pain, constipation, diarrhea, nausea and vomiting.   Genitourinary: Negative for dysuria and hematuria.   Psychiatric/Behavioral:        +Anxiety     Medications     Scheduled Meds:  ??? acetaminophen  975 mg Oral 3 times per day   ??? ceFEPime (MAXIPIME) IV extended infusion  2 g Intravenous Q12H   ??? escitalopram oxalate  5 mg Oral Daily 0900   ??? gabapentin  800 mg Oral TID   ??? heparin  5,000 Units Subcutaneous 3 times per day   ??? lidocaine  1 patch Transdermal Q24H   ??? melatonin  6 mg Oral Nightly (2100)   ??? prazosin  1 mg Oral Nightly (2100)   ??? senna-docusate  2 tablet Oral BID     Continuous Infusions:  PRN Meds:[COMPLETED] buprenorphine HCl **FOLLOWED BY** buprenorphine HCl, cloNIDine HCl, cyclobenzaprine, hydrOXYzine HCl, ketorolac, loperamide, LORazepam, LORazepam, naloxone, ondansetron, polyethylene glycol, traZODone    Vital Signs     Temp:  [97.5 ??F (36.4 ??C)-98.8 ??F (37.1 ??C)] 97.7 ??F (36.5 ??C)  Heart Rate:  [71-102] 71  Resp:  [16-18] 16  BP: (104-137)/(62-90) 137/90    Intake/Output Summary (Last 24 hours) at 08/08/18 1101  Last data filed at 08/08/18 0236   Gross per 24 hour   Intake              720 ml   Output              800 ml   Net              -80 ml       Physical Exam     Physical Exam   Constitutional: She is oriented to person, place, and  time. She appears well-developed and well-nourished. No distress.   HENT:   Head: Normocephalic and atraumatic.   Right Ear: External ear normal.   Left Ear: External ear normal.   Eyes: Conjunctivae are normal. Right eye exhibits no discharge. Left eye exhibits no discharge. No scleral icterus.   Neck: No JVD present. No tracheal deviation present.   Cardiovascular: Normal rate, regular rhythm and normal heart sounds.    Pulmonary/Chest: Effort normal and breath sounds normal.   Abdominal: Soft. Bowel sounds are normal. There is no tenderness. There is no guarding.   Musculoskeletal: She exhibits no edema.   Neurological: She is alert and oriented to person, place, and time.   Skin: Skin is warm and dry. She is not diaphoretic.   Psychiatric: She has a normal mood and affect. Her behavior is normal.     Laboratory Data         Lab 08/07/18  0442   WBC 4.7   4.7   HEMOGLOBIN 11.1*   11.1*   HEMATOCRIT 32.9*  32.9*   MEAN CORPUSCULAR VOLUME 83.7   83.6   PLATELETS 198   201         Lab 08/07/18  0442   SODIUM 140   140   POTASSIUM 4.3   4.3   CHLORIDE 101   101   CO2 30   30   BUN 22   22   CREATININE 0.50*   0.50*   GLUCOSE 104*   104*   CALCIUM 10.1   10.1             Lab 08/07/18  0442   ALT 11   AST 14   ALK PHOS 51   BILIRUBIN TOTAL 0.3   ALBUMIN 3.8     Diagnostic Studies     No new imaging     Assessment & Plan     Anne Goodwin is a 37 y.o. female on HD# 77 with Infective myositis.  The medical issues being addressed in today's encounter are as follows:    * Infective myositis       Hospital Course  Patient will need 4 weeks of IV antibiotics per Infectious Disease     Assessment and Plan for 08/08/2018  -Continue cefepime, tentative end date is 08/22/18  - Safety labs every Monday: ESR, CRP, complete metabolic panel, CBC with differential  - Pain management: Addiction services following, appreciate recommendations    - Subutex 8mg  daily               - Scheduled acetaminophen              - Lidocaine  patch              - Senna-docusate two tablets twice daily for constipation prophylaxis              - Miralaix BID as needed for constipation              - Increased gabapentin to 600 gm TID      Normocytic anemia       Hospital Course  May be anemia of chronic inflammation given osteomyelitis/infection     Assessment and Plan for 08/08/2018  Treat underlying cause  -Follow-up outpatient      Type 2 diabetes mellitus (CMS Dx)       Hospital Course  HbA1c 6.6%.     Assessment and Plan for 08/08/2018  - Has been on regular diet and patient's glucose levels have all been at goal for her entire stay.   -Can reassess if clinical risks of hyperglycemia change during stay.     Insomnia       Hospital Course  Patient having difficult sleeping      Assessment and Plan for 08/08/2018  Psychiatry following   - Continue melatonin  - Continue prazosin 1 mg at bedtime for PTSD nightmares  - Continue trazodone 25 mg at bedtime as needed for sleep     Anxiety and depression       Hospital Course  Psychiatry consulted      Assessment and Plan for 08/08/2018   Patient complains of increased anxiety   - Continue hydroxyzine as needed for anxiety  - Continue escitalopram  - Psychiatry - cognitive behavioral therapy      Opioid abuse (CMS Dx)       Hospital Course  Injects heroin and fentanyl      Assessment and Plan for 08/08/2018  Addiction services following, appreciate recommendations  -Continue Subutex 8mg   daily   -Hydroxyzine, clonidine and ativan PRN for anxuiety, agitation and restlessness;   -Imodium PRN for diarrhea      Right forearm abscess        Hospital Course  Drained by [facility] 07/27/18. No culture sent     Assessment and Plan for 08/08/2018   Completed 7 day course of doxycycline        Nutrition:   Diet Orders          Diet regular starting at 09/24 1245          Code Status: Full Code    Signed:    By signing my name below, I, Anne Goodwin , attest that this documentation has been prepared under the direction and in the  presence of Sherlyn Hay, MD PhD   Scribe name: Leia Alf Date: 08/08/2018      ----  Attending Physician Attestation  ??  The scribe???s documentation has been prepared under my direction and personally reviewed by me in its entirety. I confirm that the note above accurately reflects all work, treatment, procedures, and medical decision making performed by me.      Sherlyn Hay, MD PhD   Department of Internal Medicine  11:27 AM, 08/08/2018

## 2018-08-08 NOTE — Unmapped (Addendum)
Hospital Course  Injects heroin and fentanyl      Assessment and Plan for 08/13/2018  Addiction services following, appreciate recommendations  -Subutex 16mg  daily, 8mg  AM + 8mg  at nighttime, will change to Subutex 16mg  morning dose tomorrow  -Hydroxyzine, clonidine and ativan PRN for anxuiety, agitation and restlessness.   -Imodium PRN for diarrhea   -Changed Ativan to 0.5mg  at 0900, 1300, 1700; 1mg  at bedtime

## 2018-08-08 NOTE — Unmapped (Addendum)
Hospital Course  May be anemia of chronic inflammation given osteomyelitis/infection     Assessment and Plan for 08/13/2018  Treat underlying cause  -Follow-up outpatient

## 2018-08-08 NOTE — Unmapped (Signed)
Nursing Day Shift Progress Note    Significant Events During Shift  Anxiety and agitation much better    Patient/Family Concerns  Visitors: none  Concerns: pain    Assessment  Nursing time demands: {moderate  IV access: right lumen picc  Sitter requirements:  none    Mental Status  Mental Status for the past 14 hrs:   Level of Consciousness Orientation Level Cognition   08/08/18 0830 Alert Oriented X4 Appropriate judgement       Medications  [phone] - Medications Not Given  (last 12 hrs)         ** SITE UNKNOWN **       Medication Name Action Time Action Reason Comments     heparin (porcine) injection 5,000 Units 08/08/18 1348 Not Given Patient/family refused

## 2018-08-08 NOTE — Unmapped (Signed)
Problem: Acute Pain  Patient's pain progressing toward patient's stated pain goal   Goal: Patient displays improved well-being such as baseline levels for pulse, BP, respirations and relaxed muscle tone or body posture  Outcome: Progressing  Pt with c/o pain to left hip rated pain on pain scale 10/10 as 7/10. RN administered prn Toradol 30 mg ivp to pt. Reassessed, pt resting in bed. Will continue to [email or web address]

## 2018-08-08 NOTE — Unmapped (Addendum)
Hospital Course  Psychiatry consulted      Assessment and Plan for 08/13/2018   Patient less anxious on assessment today  - Continue hydroxyzine as needed for anxiety  - Continue escitalopram  - Psychiatry - cognitive behavioral therapy

## 2018-08-08 NOTE — Unmapped (Signed)
ADDICTION CONSULT, FOLLOW-UP PROGRESS NOTE    ASSESSMENT AND PLAN: 37 y.o.yo female admitted on 07/23/2018 for infective myositis seen by addiction consult team for opioid use    Severe Opioid use disorder - Pt previously seen by Dr Carlyle Dolly during hospitalization. Had initially started on methadone, but found this to not work well due to SE with plan shifted to start buprenorphine treatment. Methadone was stopped on Friday and pt was inducted on buprenorphine yesterday. Tolerated induction well, but reporting ongoing w/d symptoms.   -Pt received 4mg  buprenorphine total yesterday + 8mg  this morning. Has further 4mg  x2 ordered as prn withdrawal symptoms for today.   -Will follow up tomorrow regarding amount received and resolution of withdrawal symptoms.   -Pt does not have ID card. Reports plan to follow up with buprenorphine clinic that she had previously worked with. Pt reports plan to arrange appt when more is known about her dispo plans.       Anne Goodwin [facility]   Pager: [phone]        Anne Goodwin [phone]     Date/time of admission: 07/23/2018  9:13 AM Length of Stay: 16  Time spent with patient:48min    Subjective: Pt reports that is doing better this morning, but is still having some withdrawal symptoms. Notes in particular that she was having hot flashes, sweating, goose bumps and runny nose/congestion. She reports doing well with the buprenorphine doses she received yesterday with no SE. Denies worsening of withdrawal symptoms post dose. Discussed plans given that patient does not have an ID. Reports that discharge is somewhat uncertain. Had heard that she may be able to discharge slightly early and be switched to oral Abx.     PMH/SH/FH reviewed and changes noted    Current Medications ordered:  ??? acetaminophen  975 mg Oral 3 times per day   ??? ceFEPime (MAXIPIME) IV extended infusion  2 g Intravenous Q12H   ??? escitalopram oxalate  5 mg Oral Daily 0900   ??? gabapentin  800 mg Oral TID   ???  heparin  5,000 Units Subcutaneous 3 times per day   ??? lidocaine  1 patch Transdermal Q24H   ??? melatonin  6 mg Oral Nightly (2100)   ??? prazosin  1 mg Oral Nightly (2100)   ??? senna-docusate  2 tablet Oral BID     [COMPLETED] buprenorphine HCl **FOLLOWED BY** buprenorphine HCl, cloNIDine HCl, cyclobenzaprine, hydrOXYzine HCl, ketorolac, loperamide, LORazepam, LORazepam, naloxone, ondansetron, polyethylene glycol, traZODone    OBJECTIVE:  Vitals:  Marland Kitchen  Vitals:    08/08/18 0700 08/08/18 1100 08/08/18 1300 08/08/18 1500   BP: 137/90 143/82 138/80 133/80   BP Location: Left arm Left arm Left arm Left arm   Patient Position: Lying Lying Lying Lying   Pulse: 71 75 79 75   Resp: 16 16 16 16    Temp: 97.7 ??F (36.5 ??C) 98.2 ??F (36.8 ??C)  97.9 ??F (36.6 ??C)   TempSrc: Oral Oral  Oral   SpO2: 100% 100%  100%   Weight:       Height:           Mental Status Exam:   Gait/Station: not assessed  Gen/Behav: WDWN. Appears approx stated age lying on side in bed fully covered by blankets. Hair slightly disheveled from sleep, but otherwise appropriate grooming and hygiene. Calm and cooperative with assessment. Makes appropriate eye contact.   Speech: nl rate, volume and production.   Mood/Affect: fine/full, congruent, nl range  Thought process:  linear  and goal directed, no FOI, no LOA  Thought content: denies SI/HI. No delusions voiced  Perceptions: denies A/V hallucinations  Cognition: A&Ox4, recent and remote memory grossly intact, fund of knowledge is   average, attention and concentration appropriate to situation.  Judgment & Insight:   [name]

## 2018-08-08 NOTE — Unmapped (Addendum)
Hospital Course  HbA1c 6.6%. Has been on regular diet and patient's glucose levels have all been at goal for her entire stay.      Assessment and Plan for 08/13/2018  -Can reassess if clinical risks of hyperglycemia change during stay.

## 2018-08-08 NOTE — Nursing Note (Signed)
Patient refused dressing,patient states she doesn't think that her wound needs dressing.  Patient educated on wound getting reinfected and patient stated she doesn't care even after making 4 attempts to get it dressed.    She refused dressing as well yday and that has also been documented.

## 2018-08-08 NOTE — Unmapped (Addendum)
Hospital Course  Patient will need 4 weeks of IV antibiotics per Infectious Disease     Assessment and Plan for 08/13/2018   Improving-Patient able to ambulate without crutches and without pain.  -Continue cefepime, tentative end date is 08/22/18  - Safety labs every Monday: ESR, CRP, complete metabolic panel, CBC with differential  - Pain management: Addiction services following, appreciate recommendations    - Subutex 16mg  in AM daily              - Scheduled acetaminophen              - Lidocaine patch              - Senna-docusate two tablets twice daily for constipation prophylaxis              - Miralaix BID as needed for constipation              - Continue gabapentin 600mg  TID

## 2018-08-08 NOTE — Unmapped (Addendum)
Hospital Course  Drained by [facility] 07/27/18. No culture sent     Assessment and Plan for 08/13/2018   Completed 7 day course of doxycycline

## 2018-08-08 NOTE — Unmapped (Addendum)
Hospital Course  Patient having difficult sleeping      Assessment and Plan for 08/13/2018  Psychiatry following   - Continue melatonin  - Continue prazosin 1 mg at bedtime for PTSD nightmares  - Continue trazodone 25 mg at bedtime as needed for sleep

## 2018-08-08 NOTE — Unmapped (Signed)
Problem: Pain  Goal: Patient's pain is progressing toward patient's stated pain goal  Assess and monitor patient's pain using appropriate pain scale. Collaborate with interdisciplinary team and initiate plan and interventions as ordered. Re-assess patient's pain level 30 - 60 minutes after pain management intervention.   Outcome: Progressing  Patient states that even though there has been a decrease in  Her pain,it is not significant enough, she will like to see much improvement, patient states she swings between 5-6 on the numerical pain level scale and would like to see it drop to under 2

## 2018-08-09 MED ORDER — buprenorphine HCl (SUBUTEX) sl tablet 8 mg
8 | Freq: Two times a day (BID) | SUBLINGUAL | Status: AC
Start: 2018-08-09 — End: 2018-08-10
  Administered 2018-08-09 – 2018-08-10 (×3): 8 mg via SUBLINGUAL

## 2018-08-09 MED FILL — ESCITALOPRAM 10 MG TABLET: 10 10 MG | ORAL | Qty: 1

## 2018-08-09 MED FILL — LORAZEPAM 0.5 MG TABLET: 0.5 0.5 MG | ORAL | Qty: 1

## 2018-08-09 MED FILL — HYDROXYZINE HCL 10 MG TABLET: 10 10 MG | ORAL | Qty: 1

## 2018-08-09 MED FILL — GABAPENTIN 400 MG CAPSULE: 400 400 MG | ORAL | Qty: 2

## 2018-08-09 MED FILL — SENNOSIDES 8.6 MG-DOCUSATE SODIUM 50 MG TABLET: 8.6-50 8.6-50 mg | ORAL | Qty: 2

## 2018-08-09 MED FILL — SODIUM CHLORIDE 0.9 % INTRAVENOUS PIGGYBACK: 2.00 2.00 g | INTRAVENOUS | Qty: 100

## 2018-08-09 MED FILL — BUPRENORPHINE HCL 8 MG SUBLINGUAL TABLET: 8 8 mg | SUBLINGUAL | Qty: 1

## 2018-08-09 MED FILL — MELATONIN 3 MG TABLET: 3 3 mg | ORAL | Qty: 2

## 2018-08-09 MED FILL — TYLENOL 325 MG TABLET: 325 325 mg | ORAL | Qty: 3

## 2018-08-09 MED FILL — CEFEPIME 2 GRAM SOLUTION FOR INJECTION: 2 2 gram | INTRAMUSCULAR | Qty: 1

## 2018-08-09 MED FILL — LIDODERM 5 % TOPICAL PATCH: 5 5 % | TOPICAL | Qty: 1

## 2018-08-09 MED FILL — KETOROLAC 30 MG/ML (1 ML) INJECTION SOLUTION: 30 30 mg/mL (1 mL) | INTRAMUSCULAR | Qty: 1

## 2018-08-09 MED FILL — PRAZOSIN 1 MG CAPSULE: 1 1 MG | ORAL | Qty: 1

## 2018-08-09 MED FILL — CYCLOBENZAPRINE 5 MG TABLET: 5 5 MG | ORAL | Qty: 1

## 2018-08-09 NOTE — Unmapped (Signed)
I did not see or examine this patient today but reviewed data. No new recommendations today.    Seyon Strader, MD  Infectious Diseases   [phone] pager  [phone] ID pager (after hours)

## 2018-08-09 NOTE — Unmapped (Signed)
[place]  DEPARTMENT OF INTERNAL MEDICINE  DAILY PROGRESS NOTE    Chief Complaint / Reason for Follow-Up     Anne Goodwin is a 37 y.o. female on hospital day 27.  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    Interval History / Subjective     Patient reports she is feeling withdrawal symptoms in the evening and nighttime. She states she feels claustrophobic and anxious. She reports she is able to ambulate through the hallways without crutches.      Review of Systems     Review of Systems   Constitutional: Negative for chills and fever.   Respiratory: Negative for cough, shortness of breath and wheezing.    Cardiovascular: Negative for chest pain and palpitations.   Gastrointestinal: Negative for abdominal pain, constipation, diarrhea, nausea and vomiting.   Genitourinary: Negative for dysuria and hematuria.   Psychiatric/Behavioral:        +anxious     Medications     Scheduled Meds:  ??? acetaminophen  975 mg Oral 3 times per day   ??? buprenorphine HCl  8 mg Sublingual BID   ??? ceFEPime (MAXIPIME) IV extended infusion  2 g Intravenous Q12H   ??? escitalopram oxalate  5 mg Oral Daily 0900   ??? gabapentin  800 mg Oral TID   ??? lidocaine  1 patch Transdermal Q24H   ??? melatonin  6 mg Oral Nightly (2100)   ??? prazosin  1 mg Oral Nightly (2100)   ??? senna-docusate  2 tablet Oral BID     Continuous Infusions:  PRN Meds:cloNIDine HCl, cyclobenzaprine, hydrOXYzine HCl, ketorolac, loperamide, LORazepam, LORazepam, naloxone, ondansetron, polyethylene glycol, traZODone    Vital Signs     Temp:  [97.9 ??F (36.6 ??C)-98.9 ??F (37.2 ??C)] 98 ??F (36.7 ??C)  Heart Rate:  [74-90] 74  Resp:  [16-20] 18  BP: (113-143)/(68-87) 138/83    Intake/Output Summary (Last 24 hours) at 08/09/18 1045  Last data filed at 08/09/18 0603   Gross per 24 hour   Intake              840 ml   Output                0 ml   Net              840 ml     Physical Exam     Physical Exam   Constitutional: She is oriented to person, place,  and time. She appears well-developed and well-nourished. No distress.   HENT:   Head: Normocephalic and atraumatic.   Right Ear: External ear normal.   Left Ear: External ear normal.   Eyes: Conjunctivae are normal. Right eye exhibits no discharge. Left eye exhibits no discharge. No scleral icterus.   Neck: No JVD present. No tracheal deviation present.   Cardiovascular: Normal rate, regular rhythm and normal heart sounds.    Pulmonary/Chest: Effort normal and breath sounds normal.   Abdominal: Soft. Bowel sounds are normal. There is no tenderness. There is no guarding.   Musculoskeletal: She exhibits no edema.   Neurological: She is alert and oriented to person, place, and time.   Skin: Skin is warm and dry. She is not diaphoretic.   Psychiatric: She has a normal mood and affect. Her behavior is normal.     Laboratory Data         Lab 08/07/18  0442   WBC 4.7   4.7   HEMOGLOBIN  11.1*   11.1*   HEMATOCRIT 32.9*   32.9*   MEAN CORPUSCULAR VOLUME 83.7   83.6   PLATELETS 198   201         Lab 08/07/18  0442   SODIUM 140   140   POTASSIUM 4.3   4.3   CHLORIDE 101   101   CO2 30   30   BUN 22   22   CREATININE 0.50*   0.50*   GLUCOSE 104*   104*   CALCIUM 10.1   10.1             Lab 08/07/18  0442   ALT 11   AST 14   ALK PHOS 51   BILIRUBIN TOTAL 0.3   ALBUMIN 3.8     Diagnostic Studies     No new imaging     Assessment & Plan     Anne Goodwin is a 37 y.o. female on HD# 81 with Infective myositis.  The medical issues being addressed in today's encounter are as follows:    * Infective myositis       Hospital Course  Patient will need 4 weeks of IV antibiotics per Infectious Disease     Assessment and Plan for 08/09/2018   Improving-Patient able to ambulate without crutches   -Continue cefepime, tentative end date is 08/22/18  - Safety labs every Monday: ESR, CRP, complete metabolic panel, CBC with differential  - Pain management: Addiction services following, appreciate recommendations    - Subutex 8mg  daily                - Scheduled acetaminophen              - Lidocaine patch              - Senna-docusate two tablets twice daily for constipation prophylaxis              - Miralaix BID as needed for constipation              - Increased gabapentin to 600 gm TID      Normocytic anemia       Hospital Course  May be anemia of chronic inflammation given osteomyelitis/infection     Assessment and Plan for 08/09/2018  Treat underlying cause  -Follow-up outpatient      Type 2 diabetes mellitus (CMS Dx)       Hospital Course  HbA1c 6.6%.     Assessment and Plan for 08/09/2018  - Has been on regular diet and patient's glucose levels have all been at goal for her entire stay.   -Can reassess if clinical risks of hyperglycemia change during stay.     Insomnia       Hospital Course  Patient having difficult sleeping      Assessment and Plan for 08/09/2018  Psychiatry following   - Continue melatonin  - Continue prazosin 1 mg at bedtime for PTSD nightmares  - Continue trazodone 25 mg at bedtime as needed for sleep     Anxiety and depression       Hospital Course  Psychiatry consulted      Assessment and Plan for 08/09/2018   Patient complains of increased anxiety   - Continue hydroxyzine as needed for anxiety  - Continue escitalopram  - Psychiatry - cognitive behavioral therapy      Opioid abuse (CMS Dx)       Hospital Course  Injects heroin and fentanyl  Assessment and Plan for 08/09/2018  Addiction services following, appreciate recommendations  -Continue Subutex 8mg  daily   -Hydroxyzine, clonidine and ativan PRN for anxuiety, agitation and restlessness;   -Imodium PRN for diarrhea      Right forearm abscess        Hospital Course  Drained by [facility] 07/27/18. No culture sent     Assessment and Plan for 08/09/2018   Completed 7 day course of doxycycline        Nutrition:   Diet Orders          Diet regular starting at 09/24 1245        Code Status: Full Code    Signed:    By signing my name below, I, Sumata Bhimani , attest that this  documentation has been prepared under the direction and in the presence of Sherlyn Hay, MD PhD   Scribe name: Leia Alf Date: 08/09/2018    ??  ----  Attending Physician Attestation  ??  The scribe???s documentation has been prepared under my direction and personally reviewed by me in its entirety. I confirm that the note above accurately reflects all work, treatment, procedures, and medical decision making performed by me.      Sherlyn Hay, MD PhD   Department of Internal Medicine  10:45 AM, 08/09/2018

## 2018-08-10 LAB — MAGNESIUM: Magnesium: 2.1 mg/dL (ref 1.5–2.5)

## 2018-08-10 LAB — BASIC METABOLIC PANEL
Anion Gap: 7 mmol/L (ref 3–16)
BUN: 14 mg/dL (ref 7–25)
CO2: 30 mmol/L (ref 21–33)
Calcium: 10 mg/dL (ref 8.6–10.3)
Chloride: 100 mmol/L (ref 98–110)
Creatinine: 0.52 mg/dL (ref 0.60–1.30)
Glucose: 97 mg/dL (ref 70–100)
Osmolality, Calculated: 284 mOsm/kg (ref 278–305)
Potassium: 4.3 mmol/L (ref 3.5–5.3)
Sodium: 137 mmol/L (ref 133–146)
eGFR AA CKD-EPI: >90 See note.
eGFR NONAA CKD-EPI: >90 See note.

## 2018-08-10 LAB — CBC
Hematocrit: 36 % (ref 35.0–45.0)
Hemoglobin: 12.1 g/dL (ref 11.7–15.5)
MCH: 28.2 pg (ref 27.0–33.0)
MCHC: 33.6 g/dL (ref 32.0–36.0)
MCV: 83.9 fL (ref 80.0–100.0)
MPV: 8.2 fL (ref 7.5–11.5)
Platelets: 178 10*3/uL (ref 140–400)
RBC: 4.28 10*6/uL (ref 3.80–5.10)
RDW: 15.6 % — ABNORMAL HIGH (ref 11.0–15.0)
WBC: 4 10*3/uL (ref 3.8–10.8)

## 2018-08-10 LAB — PHOSPHORUS: Phosphorus: 4.6 mg/dL (ref 2.1–4.7)

## 2018-08-10 MED ORDER — buprenorphine HCl (SUBUTEX) sl tablet 8 mg
8 | Freq: Once | SUBLINGUAL | Status: AC
Start: 2018-08-10 — End: 2018-08-10
  Administered 2018-08-10: 17:00:00 8 mg via SUBLINGUAL

## 2018-08-10 MED ORDER — buprenorphine HCl (SUBUTEX) sl tablet 16 mg
8 | Freq: Every day | SUBLINGUAL | Status: AC
Start: 2018-08-10 — End: 2018-08-14
  Administered 2018-08-11 – 2018-08-14 (×4): 16 mg via SUBLINGUAL

## 2018-08-10 MED ORDER — LORazepam (ATIVAN) tablet 0.5 mg
0.5 | Freq: Three times a day (TID) | ORAL | Status: AC | PRN
Start: 2018-08-10 — End: 2018-08-11
  Administered 2018-08-11 (×2): 0.5 mg via ORAL

## 2018-08-10 MED FILL — GABAPENTIN 400 MG CAPSULE: 400 400 MG | ORAL | Qty: 2

## 2018-08-10 MED FILL — LIDODERM 5 % TOPICAL PATCH: 5 5 % | TOPICAL | Qty: 1

## 2018-08-10 MED FILL — CEFEPIME 2 GRAM SOLUTION FOR INJECTION: 2 2 gram | INTRAMUSCULAR | Qty: 1

## 2018-08-10 MED FILL — BUPRENORPHINE HCL 8 MG SUBLINGUAL TABLET: 8 8 mg | SUBLINGUAL | Qty: 1

## 2018-08-10 MED FILL — PRAZOSIN 1 MG CAPSULE: 1 1 MG | ORAL | Qty: 1

## 2018-08-10 MED FILL — CYCLOBENZAPRINE 5 MG TABLET: 5 5 MG | ORAL | Qty: 1

## 2018-08-10 MED FILL — HYDROXYZINE HCL 10 MG TABLET: 10 10 MG | ORAL | Qty: 1

## 2018-08-10 MED FILL — SENNOSIDES 8.6 MG-DOCUSATE SODIUM 50 MG TABLET: 8.6-50 8.6-50 mg | ORAL | Qty: 2

## 2018-08-10 MED FILL — TYLENOL 325 MG TABLET: 325 325 mg | ORAL | Qty: 3

## 2018-08-10 MED FILL — LORAZEPAM 0.5 MG TABLET: 0.5 0.5 MG | ORAL | Qty: 1

## 2018-08-10 MED FILL — SODIUM CHLORIDE 0.9 % INTRAVENOUS PIGGYBACK: 2.00 2.00 g | INTRAVENOUS | Qty: 100

## 2018-08-10 MED FILL — MELATONIN 3 MG TABLET: 3 3 mg | ORAL | Qty: 2

## 2018-08-10 MED FILL — ESCITALOPRAM 10 MG TABLET: 10 10 MG | ORAL | Qty: 1

## 2018-08-10 NOTE — Unmapped (Signed)
ADDICTION CONSULT, FOLLOW-UP PROGRESS NOTE    ASSESSMENT AND PLAN: 37 y.o.yo female admitted on 07/23/2018 for infective myositis seen by addiction consult team for opioid use    Severe Opioid use disorder - Pt previously seen by Dr Carlyle Dolly during hospitalization. Had initially started on methadone, but found this to not work well due to SE with plan shifted to start buprenorphine treatment. Has transitioned to buprenorphine and reports doing well.    -Continue buprenorphine but at consolidated dose of 16mg  daily  -Pt does not have ID card. Reports plan to follow up with buprenorphine clinic that she had previously worked with [facility]). Pt reports plan to arrange appt when more is known about her dispo plans.       Anne Goodwin   Pager: [phone]        Anne Goodwin [phone]     Date/time of admission: 07/23/2018  9:13 AM Length of Stay: 18  Time spent with patient:83min    Subjective: Pt reports that is doing well. Asks about changing the way her buprenorphine is dosed. Would like to be able to take her buprenorphine all in the morning. Pt states that she feels like it will work better that way. She denies SE except for maybe some sweating, but is uncertain if this is related to med or not. Plan is to follow up with [facility] for buprenorphine and [facility] for psych services.     PMH/SH/FH reviewed and changes noted    Current Medications ordered:  ??? acetaminophen  975 mg Oral 3 times per day   ??? [START ON 08/11/2018] buprenorphine HCl  16 mg Sublingual Daily 0900   ??? ceFEPime (MAXIPIME) IV extended infusion  2 g Intravenous Q12H   ??? escitalopram oxalate  5 mg Oral Daily 0900   ??? gabapentin  800 mg Oral TID   ??? lidocaine  1 patch Transdermal Q24H   ??? melatonin  6 mg Oral Nightly (2100)   ??? prazosin  1 mg Oral Nightly (2100)   ??? senna-docusate  2 tablet Oral BID     cloNIDine HCl, cyclobenzaprine, hydrOXYzine HCl, loperamide, LORazepam, LORazepam, naloxone, ondansetron, polyethylene glycol,  traZODone    OBJECTIVE:  Vitals:  Marland Kitchen  Vitals:    08/09/18 1536 08/09/18 2005 08/09/18 2055 08/10/18 0730   BP: 116/73 134/79 134/79 118/75   BP Location: Left arm Left arm  Left arm   Patient Position: Lying Lying  Lying   Pulse: 86 94  88   Resp: 18 20  14    Temp: 97.7 ??F (36.5 ??C) 98.7 ??F (37.1 ??C)  98.4 ??F (36.9 ??C)   TempSrc: Oral Oral  Oral   SpO2: 100% 99%  97%   Weight:       Height:           Mental Status Exam:   Gait/Station: not assessed  Gen/Behav: WDWN. Appears approx stated age lying on side in bed wearing blue and gray tank top and Goodwin pants. Hair slightly disheveled from sleep, but otherwise appropriate grooming and hygiene. Calm and cooperative with assessment. Makes appropriate eye contact.   Speech: nl rate, volume and production.   Mood/Affect: fine/full, congruent, nl range  Thought process:  linear and goal directed, no FOI, no LOA  Thought content: denies SI/HI. No delusions voiced  Perceptions: denies A/V hallucinations  Cognition: A&Ox4, recent and remote memory grossly intact, fund of knowledge is average, attention and concentration appropriate to situation.  Judgment & Insight:   Anne Goodwin

## 2018-08-10 NOTE — Unmapped (Signed)
[facility] ACUTE CARE SURGERY PROGRESS NOTE  Admit Date: 07/23/2018  OR Date:       Subjective:   Remains hospitalized and seen in follow up for RUE abscess s/p I&D  No longer requiring packing, no drainage and all erythema resolved.  No complaints or pain at site, which is fully functional    Objective:   Vitals:  Temp:  [97.9 ??F (36.6 ??C)-98.7 ??F (37.1 ??C)] 97.9 ??F (36.6 ??C)  Heart Rate:  [88-96] 96  Resp:  [14-20] 16  BP: (118-134)/(73-79) 127/73  Vitals:    08/10/18 1500   BP: 127/73   Pulse: 96   Resp: 16   Temp: 97.9 ??F (36.6 ??C)   SpO2: 99%         Date 08/09/18 1500 - 08/10/18 0659 08/10/18 0700 - 08/11/18 0659   Shift 1500-2259 2300-0659 24 Hour Total 0700-1459 1500-2259 2300-0659 24 Hour Total   I  N  T  A  K  E   P.O. 720  1080 360   360      P.O. 720  1080 360   360    Shift Total  (mL/kg) 720  (12.2)  1080  (18.3) 360  (6.1)   360  (6.1)   O  U  T  P  U  T   Urine  (mL/kg/hr)             Urine Occurrence 1 x  2 x        Emesis/NG output             Emesis Occurrence 0 x  0 x        Stool             Stool Occurrence 0 x  0 x        Shift Total  (mL/kg)          Weight (kg) 59 59 59 59 59 59 59       Physical Exam:  Gen: Alert and oriented x3, no acute distress while lying in bed  HEENT: NCAT, PERRL  CV: Regular rate and rhythm  Resp: Even and easy  Abd: Soft, non-distended, non-tender  Ext: Warm and well perfused  Wound: R FA prior abscess wound flat, 1/2cm healed incision without surrounding induration or erythema.      Recent Labs      08/10/18   0635   WBC  4.0   HGB  12.1   HCT  36.0   MCV  83.9   PLT  178     Recent Labs      08/10/18   0635   NA  137   K  4.3   CL  100   CO2  30   PHOS  4.6   BUN  14   CREATININE  0.52*   CALCIUM  10.0     No results for input(s): POCGLU, POCGMD in the last 72 hours.    Invalid input(s): GLU  No results for input(s): BILITOT, AST, ALT, ALKPHOS, GGT, AMYLASE, LIPASE in the last 72 hours.    Invalid input(s): BILIDIR, 5NUC, ALB  No results for input(s): INR, PROTIME in the  last 72 hours.    Current Medications:  Scheduled Medications:    acetaminophen 975 mg 3 times per day   [START ON 08/11/2018] buprenorphine HCl 16 mg Daily 0900   ceFEPime (MAXIPIME) IV extended infusion 2 g Q12H   escitalopram oxalate 5 mg Daily 0900  gabapentin 800 mg TID   lidocaine 1 patch Q24H   melatonin 6 mg Nightly (2100)   prazosin 1 mg Nightly (2100)   senna-docusate 2 tablet BID      IV Meds:      PRN Medications:    cloNIDine HCl 0.1 mg Q1H PRN   cyclobenzaprine 5 mg BID PRN   hydrOXYzine HCl 10 mg Q4H PRN   loperamide 2 mg 4x Daily PRN   LORazepam 0.5 mg Nightly PRN   LORazepam 0.5 mg BID PRN   naloxone 0.4 mg PRN   ondansetron 4 mg Q6H PRN   polyethylene glycol 17 g BID PRN   traZODone 25 mg Nightly PRN       Imaging:  No results found.      Assessment / Plan:   Anne Goodwin is a 37 y.o. 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  Surgery team completed I&D at bedside on 07/27/18.      Right Dorsal FA Abscess  - Last I&D 9/26, 1 cm incision made at that time with purulent drainage.  No further areas of fluctuance and pain has improved significantly to this area, though remains tender.  - Medicine performed I&D on 9/25 and culture sent, but this was subsequently cancelled.  - Remains hospitalized and ID following for infective myositis  - Was previously packing wound with changes BID and then daily.  Fully healed and no longer requires packing, surrounding erythema and induration completely resolved.  No further follow up with [facility].    Anne Goodwin S. Edwena Bunde, CNP  Department of Surgery  Division of Trauma, Surgical Critical Care, and Acute Care Surgery  Academic Office:  [phone]  Acute Care Surgery Pager:  [phone]    ??

## 2018-08-10 NOTE — Unmapped (Signed)
[place]  DEPARTMENT OF INTERNAL MEDICINE  DAILY PROGRESS NOTE    Chief Complaint / Reason for Follow-Up     Anne Goodwin is a 37 y.o. female on hospital day 14.  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    Interval History / Subjective     No acute events overnight. Patient states her pain is getting better and is having normal bowel movements. Patient reports that she felt withdrawal symptoms yesterday at 4pm and would like to convert her Subutex to a 16mg  daytime dose instead of split up 8mg  twice a day. I discussed with patient importance of continuing PO antibiotics upon discharge although she might not be experiencing pain.     Review of Systems     Review of Systems   Constitutional: Negative for chills and fever.   Respiratory: Negative for cough, shortness of breath and wheezing.    Cardiovascular: Negative for chest pain and palpitations.   Gastrointestinal: Negative for abdominal pain, constipation, diarrhea, nausea and vomiting.   Genitourinary: Negative for dysuria and hematuria.     Medications     Scheduled Meds:  ??? acetaminophen  975 mg Oral 3 times per day   ??? [START ON 08/11/2018] buprenorphine HCl  16 mg Sublingual Daily 0900   ??? ceFEPime (MAXIPIME) IV extended infusion  2 g Intravenous Q12H   ??? escitalopram oxalate  5 mg Oral Daily 0900   ??? gabapentin  800 mg Oral TID   ??? lidocaine  1 patch Transdermal Q24H   ??? melatonin  6 mg Oral Nightly (2100)   ??? prazosin  1 mg Oral Nightly (2100)   ??? senna-docusate  2 tablet Oral BID     Continuous Infusions:  PRN Meds:cloNIDine HCl, cyclobenzaprine, hydrOXYzine HCl, loperamide, LORazepam, LORazepam, naloxone, ondansetron, polyethylene glycol, traZODone    Vital Signs     Temp:  [97.7 ??F (36.5 ??C)-98.7 ??F (37.1 ??C)] 98.4 ??F (36.9 ??C)  Heart Rate:  [86-94] 88  Resp:  [14-20] 14  BP: (116-134)/(73-79) 118/75    Intake/Output Summary (Last 24 hours) at 08/10/18 1331  Last data filed at 08/10/18 0900   Gross per 24 hour    Intake             1080 ml   Output                0 ml   Net             1080 ml       Physical Exam     Physical Exam   Constitutional: She is oriented to person, place, and time. She appears well-developed and well-nourished. No distress.   HENT:   Head: Normocephalic and atraumatic.   Right Ear: External ear normal.   Left Ear: External ear normal.   Eyes: Conjunctivae are normal. Right eye exhibits no discharge. Left eye exhibits no discharge. No scleral icterus.   Neck: No JVD present. No tracheal deviation present.   Cardiovascular: Normal rate, regular rhythm and normal heart sounds.    Pulmonary/Chest: Effort normal and breath sounds normal.   Abdominal: Soft. Bowel sounds are normal. There is no tenderness. There is no guarding.   Musculoskeletal: She exhibits no edema.   Neurological: She is alert and oriented to person, place, and time.   Skin: Skin is warm and dry. She is not diaphoretic.   Psychiatric: She has a normal mood and affect. Her behavior is normal.  Laboratory Data         Lab 08/10/18  0635 08/07/18  0442   WBC 4.0 4.7   4.7   HEMOGLOBIN 12.1 11.1*   11.1*   HEMATOCRIT 36.0 32.9*   32.9*   MEAN CORPUSCULAR VOLUME 83.9 83.7   83.6   PLATELETS 178 198   201         Lab 08/10/18  0635 08/07/18  0442   SODIUM 137 140   140   POTASSIUM 4.3 4.3   4.3   CHLORIDE 100 101   101   CO2 30 30   30    BUN 14 22   22    CREATININE 0.52* 0.50*   0.50*   GLUCOSE 97 104*   104*   CALCIUM 10.0 10.1   10.1   MAGNESIUM 2.1  --    PHOSPHORUS 4.6  --              Lab 08/07/18  0442   ALT 11   AST 14   ALK PHOS 51   BILIRUBIN TOTAL 0.3   ALBUMIN 3.8       Diagnostic Studies     No new imaging     Assessment & Plan     Anne Goodwin is a 37 y.o. female on HD# 7 with Infective myositis.  The medical issues being addressed in today's encounter are as follows:    * Infective myositis       Hospital Course  Patient will need 4 weeks of IV antibiotics per Infectious Disease     Assessment and Plan for  08/10/2018   Improving-Patient able to ambulate without crutches   -Continue cefepime, tentative end date is 08/22/18  - Safety labs every Monday: ESR, CRP, complete metabolic panel, CBC with differential  - Pain management: Addiction services following, appreciate recommendations    - Subutex 16mg  daily, 8mg  AM + 8mg  at nighttime, will change to Subutex 16mg  morning dose tomorrow              - Scheduled acetaminophen              - Lidocaine patch              - Senna-docusate two tablets twice daily for constipation prophylaxis              - Miralaix BID as needed for constipation              - Increased gabapentin to 600 gm TID      Normocytic anemia       Hospital Course  May be anemia of chronic inflammation given osteomyelitis/infection     Assessment and Plan for 08/10/2018  Treat underlying cause  -Follow-up outpatient      Type 2 diabetes mellitus (CMS Dx)       Hospital Course  HbA1c 6.6%.     Assessment and Plan for 08/10/2018  - Has been on regular diet and patient's glucose levels have all been at goal for her entire stay.   -Can reassess if clinical risks of hyperglycemia change during stay.     Insomnia       Hospital Course  Patient having difficult sleeping      Assessment and Plan for 08/10/2018  Psychiatry following   - Continue melatonin  - Continue prazosin 1 mg at bedtime for PTSD nightmares  - Continue trazodone 25 mg at bedtime as needed for sleep     Anxiety and  depression       Hospital Course  Psychiatry consulted      Assessment and Plan for 08/10/2018   Patient less anxious on assessment today  - Continue hydroxyzine as needed for anxiety  - Continue escitalopram  - Psychiatry - cognitive behavioral therapy      Opioid abuse (CMS Dx)       Hospital Course  Injects heroin and fentanyl      Assessment and Plan for 08/10/2018  Addiction services following, appreciate recommendations  -Subutex 16mg  daily, 8mg  AM + 8mg  at nighttime, will change to Subutex 16mg  morning dose  tomorrow  -Hydroxyzine, clonidine and ativan PRN for anxuiety, agitation and restlessness;   -Imodium PRN for diarrhea      Right forearm abscess        Hospital Course  Drained by [facility] 07/27/18. No culture sent     Assessment and Plan for 08/10/2018   Completed 7 day course of doxycycline          Nutrition:   Diet Orders          Diet regular starting at 09/24 1245          Code Status: Full Code    Signed:  By signing my name below, I, Sumata Bhimani , attest that this documentation has been prepared under the direction and in the presence of Sherlyn Hay, MD PhD   Scribe name: Leia Alf Date: 08/10/2018    ??  ----  Attending Physician Attestation  ??  The scribe???s documentation has been prepared under my direction and personally reviewed by me in its entirety. I confirm that the note above accurately reflects all work, treatment, procedures, and medical decision making performed by me.      Sherlyn Hay, MD PhD   Department of Internal Medicine  1:31 PM, 08/10/2018

## 2018-08-10 NOTE — Unmapped (Signed)
Problem: Fall Prevention  Goal: Patient will remain free of falls  Assess and monitor vitals signs, neurological status including level of consciousness and orientation.  Reassess fall risk per hospital policy.    Ensure arm band on, uncluttered walking paths in room, adequate room lighting, call light and overbed table within reach, bed in low position, wheels locked, side rails up per policy (excluding SNF), and non-skid footwear provided.    Non slip sock on, side rails up x 3. Crutches at bedside. Pt AOx4

## 2018-08-11 MED ORDER — LORazepam (ATIVAN) tablet 1 mg
1 | Freq: Every evening | ORAL | Status: AC
Start: 2018-08-11 — End: 2018-08-12
  Administered 2018-08-12: 1 mg via ORAL

## 2018-08-11 MED ORDER — LORazepam (ATIVAN) tablet 1 mg
1 | Freq: Every evening | ORAL | Status: AC
Start: 2018-08-11 — End: 2018-08-11

## 2018-08-11 MED ORDER — LORazepam (ATIVAN) tablet 0.5 mg
0.5 | Freq: Three times a day (TID) | ORAL | Status: AC | PRN
Start: 2018-08-11 — End: 2018-08-11

## 2018-08-11 MED ORDER — LORazepam (ATIVAN) tablet 0.5 mg
0.5 | Freq: Four times a day (QID) | ORAL | Status: AC
Start: 2018-08-11 — End: 2018-08-12
  Administered 2018-08-11 – 2018-08-12 (×3): 0.5 mg via ORAL

## 2018-08-11 MED FILL — TRAZODONE 25 MG DOSE: 50 50 MG | ORAL | Qty: 1

## 2018-08-11 MED FILL — GABAPENTIN 400 MG CAPSULE: 400 400 MG | ORAL | Qty: 2

## 2018-08-11 MED FILL — LORAZEPAM 0.5 MG TABLET: 0.5 0.5 MG | ORAL | Qty: 1

## 2018-08-11 MED FILL — SENNOSIDES 8.6 MG-DOCUSATE SODIUM 50 MG TABLET: 8.6-50 8.6-50 mg | ORAL | Qty: 2

## 2018-08-11 MED FILL — CEFEPIME 2 GRAM SOLUTION FOR INJECTION: 2 2 gram | INTRAMUSCULAR | Qty: 1

## 2018-08-11 MED FILL — PRAZOSIN 1 MG CAPSULE: 1 1 MG | ORAL | Qty: 1

## 2018-08-11 MED FILL — ESCITALOPRAM 10 MG TABLET: 10 10 MG | ORAL | Qty: 1

## 2018-08-11 MED FILL — BUPRENORPHINE HCL 8 MG SUBLINGUAL TABLET: 8 8 mg | SUBLINGUAL | Qty: 2

## 2018-08-11 MED FILL — SODIUM CHLORIDE 0.9 % INTRAVENOUS PIGGYBACK: 2.00 2.00 g | INTRAVENOUS | Qty: 100

## 2018-08-11 MED FILL — CYCLOBENZAPRINE 5 MG TABLET: 5 5 MG | ORAL | Qty: 1

## 2018-08-11 MED FILL — MELATONIN 3 MG TABLET: 3 3 mg | ORAL | Qty: 2

## 2018-08-11 MED FILL — LORAZEPAM 1 MG TABLET: 1 1 MG | ORAL | Qty: 1

## 2018-08-11 MED FILL — TYLENOL 325 MG TABLET: 325 325 mg | ORAL | Qty: 3

## 2018-08-11 MED FILL — LIDODERM 5 % TOPICAL PATCH: 5 5 % | TOPICAL | Qty: 1

## 2018-08-11 MED FILL — POLYETHYLENE GLYCOL 3350 17 GRAM ORAL POWDER PACKET: 17 17 gram | ORAL | Qty: 1

## 2018-08-11 NOTE — Unmapped (Signed)
Nursing Day Shift Progress Note    Significant Events During Shift  Pt Ativan orders modified, now getting 1mg  at bedtime and scheduled q6h while awake. New orders for VS q8h only. Patient requests minimal disruptions throughout the night to promot adequate rest. States she does not want to be woken up before 0500 if possible. Patient did not request PRN flexeril this shift.     Patient/Family Concerns  Visitors: none  Concerns: anxiety and pain    Assessment  Nursing time demands: low  IV access: has IV access, adequate and functioning  Sitter requirements:  no    Mental Status  Mental Status for the past 14 hrs:   Level of Consciousness Orientation Level Cognition   08/11/18 0936 Alert Oriented X4 Ability to abstract;Appropriate judgement;Appropriate safety awareness;Follows 2-step commands       Medications  [phone] - Medications Not Given  (last 12 hrs)         ** No medications to display **

## 2018-08-11 NOTE — Unmapped (Signed)
Patient will transition to PO antibiotics on Monday and will be able to discharge to her father's home. RN CM met with patient and provided information on [employer] and requirements for application. Patient will follow up with [employer] on Monday regarding the application process.    Patient will follow up with [facility] for suboxone and [facility] for mental health upon discharge.      Shelly A. Leavy Cella, RN CM  [address], RN Care Coordinator  [phone] (phone)  [phone] (fax)

## 2018-08-11 NOTE — Unmapped (Signed)
Problem: Psychosocial Needs  Goal: Demonstrates ability to cope with hospitalization/illness  Assess and monitor patients ability to cope with his/her illness.   Outcome: Progressing  Pt expressed to RN that ativan q8h was not helpful for her. MD updated orders. Pt updated

## 2018-08-11 NOTE — Unmapped (Signed)
[place]  DEPARTMENT OF INTERNAL MEDICINE  DAILY PROGRESS NOTE    Chief Complaint / Reason for Follow-Up     Anne Goodwin is a 37 y.o. female on hospital day 32.  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    Interval History / Subjective     No acute events overnight. Patient doing okay this morning. She reports she had an episode of hot flashes and diaphoresis yesterday afternoon. Wants her Ativan timing adjusted as she did not get a scheduled dose last PM.    Review of Systems     Review of Systems   Constitutional: Negative for chills, diaphoresis and fever.   Respiratory: Negative for cough, shortness of breath and wheezing.    Cardiovascular: Negative for chest pain and palpitations.   Gastrointestinal: Negative for abdominal pain, constipation, diarrhea, nausea and vomiting.   Genitourinary: Negative for dysuria and hematuria.     Medications     Scheduled Meds:  ??? acetaminophen  975 mg Oral 3 times per day   ??? buprenorphine HCl  16 mg Sublingual Daily 0900   ??? ceFEPime (MAXIPIME) IV extended infusion  2 g Intravenous Q12H   ??? escitalopram oxalate  5 mg Oral Daily 0900   ??? gabapentin  800 mg Oral TID   ??? lidocaine  1 patch Transdermal Q24H   ??? melatonin  6 mg Oral Nightly (2100)   ??? prazosin  1 mg Oral Nightly (2100)   ??? senna-docusate  2 tablet Oral BID     Continuous Infusions:  PRN Meds:cloNIDine HCl, cyclobenzaprine, hydrOXYzine HCl, loperamide, LORazepam, naloxone, ondansetron, polyethylene glycol, traZODone    Vital Signs     Temp:  [97.5 ??F (36.4 ??C)-97.9 ??F (36.6 ??C)] 97.8 ??F (36.6 ??C)  Heart Rate:  [76-97] 89  Resp:  [16-20] 20  BP: (97-127)/(56-75) 114/71    Intake/Output Summary (Last 24 hours) at 08/11/18 1138  Last data filed at 08/10/18 2300   Gross per 24 hour   Intake              180 ml   Output                0 ml   Net              180 ml       Physical Exam     Physical Exam   Constitutional: She is oriented to person, place, and time. She appears  well-developed and well-nourished. No distress.   HENT:   Head: Normocephalic and atraumatic.   Right Ear: External ear normal.   Left Ear: External ear normal.   Eyes: Conjunctivae are normal. Right eye exhibits no discharge. Left eye exhibits no discharge. No scleral icterus.   Neck: No JVD present. No tracheal deviation present.   Cardiovascular: Normal rate, regular rhythm and normal heart sounds.    Pulmonary/Chest: Effort normal and breath sounds normal.   Abdominal: Soft. Bowel sounds are normal. There is no tenderness. There is no guarding.   Musculoskeletal: She exhibits no edema.   Neurological: She is alert and oriented to person, place, and time.   Skin: Skin is warm and dry. She is not diaphoretic.   Psychiatric: She has a normal mood and affect. Her behavior is normal.     Laboratory Data         Lab 08/10/18  0635 08/07/18  0442   WBC 4.0 4.7   4.7   HEMOGLOBIN 12.1  11.1*   11.1*   HEMATOCRIT 36.0 32.9*   32.9*   MEAN CORPUSCULAR VOLUME 83.9 83.7   83.6   PLATELETS 178 198   201         Lab 08/10/18  0635 08/07/18  0442   SODIUM 137 140   140   POTASSIUM 4.3 4.3   4.3   CHLORIDE 100 101   101   CO2 30 30   30    BUN 14 22   22    CREATININE 0.52* 0.50*   0.50*   GLUCOSE 97 104*   104*   CALCIUM 10.0 10.1   10.1   MAGNESIUM 2.1  --    PHOSPHORUS 4.6  --              Lab 08/07/18  0442   ALT 11   AST 14   ALK PHOS 51   BILIRUBIN TOTAL 0.3   ALBUMIN 3.8       Diagnostic Studies     No new imaging     Assessment & Plan     Anne Goodwin is a 37 y.o. female on HD# 68 with Infective myositis.  The medical issues being addressed in today's encounter are as follows:    * Infective myositis       Hospital Course  Patient will need 4 weeks of IV antibiotics per Infectious Disease     Assessment and Plan for 08/11/2018   Improving-Patient able to ambulate without crutches   -Continue cefepime, tentative end date is 08/22/18  - Safety labs every Monday: ESR, CRP, complete metabolic panel, CBC with  differential  - Pain management: Addiction services following, appreciate recommendations    - Subutex 16mg  in AM daily              - Scheduled acetaminophen              - Lidocaine patch              - Senna-docusate two tablets twice daily for constipation prophylaxis              - Miralaix BID as needed for constipation              - Increased gabapentin to 600 gm TID      Normocytic anemia       Hospital Course  May be anemia of chronic inflammation given osteomyelitis/infection     Assessment and Plan for 08/11/2018  Treat underlying cause  -Follow-up outpatient      Type 2 diabetes mellitus (CMS Dx)       Hospital Course  HbA1c 6.6%.     Assessment and Plan for 08/11/2018  - Has been on regular diet and patient's glucose levels have all been at goal for her entire stay.   -Can reassess if clinical risks of hyperglycemia change during stay.     Insomnia       Hospital Course  Patient having difficult sleeping      Assessment and Plan for 08/11/2018  Psychiatry following   - Continue melatonin  - Continue prazosin 1 mg at bedtime for PTSD nightmares  - Continue trazodone 25 mg at bedtime as needed for sleep     Anxiety and depression       Hospital Course  Psychiatry consulted      Assessment and Plan for 08/11/2018   Patient less anxious on assessment today  - Continue hydroxyzine as needed for anxiety  - Continue  escitalopram  - Psychiatry - cognitive behavioral therapy      Opioid abuse (CMS Dx)       Hospital Course  Injects heroin and fentanyl      Assessment and Plan for 08/11/2018  Addiction services following, appreciate recommendations  -Subutex 16mg  daily, 8mg  AM + 8mg  at nighttime, will change to Subutex 16mg  morning dose tomorrow  -Hydroxyzine, clonidine and ativan PRN for anxuiety, agitation and restlessness. Will touch base with psych about tapering Ativan   -Imodium PRN for diarrhea      Right forearm abscess        Hospital Course  Drained by [facility] 07/27/18. No culture sent     Assessment and  Plan for 08/11/2018   Completed 7 day course of doxycycline        Nutrition:   Diet Orders          Diet regular starting at 09/24 1245          Code Status: Full Code    Signed:    By signing my name below, I, Sumata Bhimani , attest that this documentation has been prepared under the direction and in the presence of Sherlyn Hay, MD PhD   Scribe name: Leia Alf Date: 08/11/2018    ??  ----  Attending Physician Attestation  ??  The scribe???s documentation has been prepared under my direction and personally reviewed by me in its entirety. I confirm that the note above accurately reflects all work, treatment, procedures, and medical decision making performed by me.      Sherlyn Hay, MD PhD   Department of Internal Medicine  11:38 AM, 08/11/2018

## 2018-08-11 NOTE — Unmapped (Signed)
INFECTIOUS DISEASE PROGRESS NOTE    08/11/2018  8:04 PM    Anne Goodwin is a 37 y.o. female patient. Hospital Day: 60     Chief Complaint/Reason for Follow-up: Myositis    Subjective:    Still with mild left sacro-iliac /gluteal pain, but ambulating freely  No fevers or chills  No chest pain      Review of Systems   Constitutional: Positive for fatigue. Negative for chills and fever.   HENT: Negative for mouth sores.    Respiratory: Negative for shortness of breath.    Cardiovascular: Negative for chest pain and leg swelling.   Gastrointestinal: Negative for abdominal pain.   Genitourinary: Negative for dysuria.   Musculoskeletal: Positive for back pain and myalgias.       Objective:  Vital signs:  Vitals:    08/11/18 1940   BP: 109/64   Pulse: 92   Resp: 16   Temp: 98.3 ??F (36.8 ??C)   SpO2: 100%     Temp last 24 hours:Temp (24hrs), Avg:97.9 ??F (36.6 ??C), Min:97.5 ??F (36.4 ??C), Max:98.3 ??F (36.8 ??C)    Scheduled Meds:  ??? acetaminophen  975 mg Oral 3 times per day   ??? buprenorphine HCl  16 mg Sublingual Daily 0900   ??? ceFEPime (MAXIPIME) IV extended infusion  2 g Intravenous Q12H   ??? escitalopram oxalate  5 mg Oral Daily 0900   ??? gabapentin  800 mg Oral TID   ??? lidocaine  1 patch Transdermal Q24H   ??? LORazepam  0.5 mg Oral Q6HWA    And   ??? LORazepam  1 mg Oral Nightly (2100)   ??? melatonin  6 mg Oral Nightly (2100)   ??? prazosin  1 mg Oral Nightly (2100)   ??? senna-docusate  2 tablet Oral BID     Continuous Infusions:  PRN Meds:cloNIDine HCl, cyclobenzaprine, hydrOXYzine HCl, loperamide, naloxone, polyethylene glycol, traZODone    Intake/Output last 3 shifts:    Date 08/10/18 1500 - 08/11/18 0659 08/11/18 0700 - 08/12/18 0659   Shift 1500-2259 2300-0659 24 Hour Total 0700-1459 1500-2259 2300-0659 24 Hour Total   I  N  T  A  K  E   P.O.  180 540  480  480      P.O.  180 540  480  480    Shift Total  (mL/kg)  180  (3.1) 540  (9.2)  480  (8.1)  480  (8.1)   O  U  T  P  U  T   Urine  (mL/kg/hr)             Urine  Occurrence  2 x 2 x        Emesis/NG output             Emesis Occurrence  0 x 0 x        Stool             Stool Occurrence  0 x 0 x        Shift Total  (mL/kg)          Weight (kg) 59 59 59 59 59 59 59       Physical Exam   Nursing note and vitals reviewed.  Constitutional: No distress.   HENT:   Mouth/Throat: No oropharyngeal exudate.   Eyes: Pupils are equal, round, and reactive to light. No scleral icterus.   Neck: Neck supple. No JVD present.   Cardiovascular: Normal rate, regular rhythm,  normal heart sounds and intact distal pulses.    Pulmonary/Chest: Effort normal and breath sounds normal. She has no rales.   Abdominal: Soft. Bowel sounds are normal. There is no tenderness.   Musculoskeletal: She exhibits no edema.   Minimal tenderness left SI paraspinal region with radiation to left buttock       Labs:       Lab 08/10/18  0635   WBC 4.0   HEMOGLOBIN 12.1   HEMATOCRIT 36.0   MEAN CORPUSCULAR VOLUME 83.9   PLATELETS 178        Lab 08/10/18  0635   SODIUM 137   POTASSIUM 4.3   CHLORIDE 100   CO2 30   BUN 14   CREATININE 0.52*   GLUCOSE 97   CALCIUM 10.0   MAGNESIUM 2.1   PHOSPHORUS 4.6          Lab 08/07/18  0442   ALK PHOS 51   AST 14   ALT 11   BILIRUBIN TOTAL 0.3             Results for CANYON, WILLOW (MRN [id number]) as of 08/07/2018 21:18   Ref. Range 07/23/2018 09:59 07/31/2018 03:53 08/07/2018 04:42 08/07/2018 04:42   CRP Latest Ref Range: 1.0 - 10.0 mg/L 86.3 (H) 4.8 2.2 2.3   Results for HANNAN, TETZLAFF (MRN [id number]) as of 08/07/2018 21:18   Ref. Range 07/23/2018 09:59 07/31/2018 03:53 08/07/2018 04:42 08/07/2018 04:42   Sed Rate by Modified Westergren Latest Ref Range: 0 - 20 mm/hr 75 (H) 27 (H) 29 (H) 33 (H)       Assessment/Plan:  Principal Problem:    Infective myositis  Active Problems:    Right forearm abscess     Opioid abuse (CMS Dx)    Anxiety and depression    Insomnia    Type 2 diabetes mellitus (CMS Dx)    Normocytic anemia    Septic arthritis/myositis left   Prior serratia  bacteremia   Responding to therapy   Planned for IV therapy to 10/22  But given improvement will consider IV therapy for additional week then conversion to PO cipro  Planned conversion/discharge on 10/14    Substance abuse    Followed by addiction services      Arbie Cookey, MD  08/11/2018  Pager [phone]

## 2018-08-12 MED ORDER — LORazepam (ATIVAN) tablet 0.5 mg
0.5 | Freq: Three times a day (TID) | ORAL | Status: AC
Start: 2018-08-12 — End: 2018-08-14
  Administered 2018-08-12 – 2018-08-14 (×6): 0.5 mg via ORAL

## 2018-08-12 MED ORDER — LORazepam (ATIVAN) tablet 1 mg
1 | Freq: Every evening | ORAL | Status: AC
Start: 2018-08-12 — End: 2018-08-14
  Administered 2018-08-13 – 2018-08-14 (×2): 1 mg via ORAL

## 2018-08-12 MED FILL — TYLENOL 325 MG TABLET: 325 325 mg | ORAL | Qty: 3

## 2018-08-12 MED FILL — ESCITALOPRAM 10 MG TABLET: 10 10 MG | ORAL | Qty: 1

## 2018-08-12 MED FILL — HYDROXYZINE HCL 10 MG TABLET: 10 10 MG | ORAL | Qty: 1

## 2018-08-12 MED FILL — BUPRENORPHINE HCL 8 MG SUBLINGUAL TABLET: 8 8 mg | SUBLINGUAL | Qty: 2

## 2018-08-12 MED FILL — MELATONIN 3 MG TABLET: 3 3 mg | ORAL | Qty: 2

## 2018-08-12 MED FILL — SENNOSIDES 8.6 MG-DOCUSATE SODIUM 50 MG TABLET: 8.6-50 8.6-50 mg | ORAL | Qty: 2

## 2018-08-12 MED FILL — GABAPENTIN 400 MG CAPSULE: 400 400 MG | ORAL | Qty: 2

## 2018-08-12 MED FILL — POLYETHYLENE GLYCOL 3350 17 GRAM ORAL POWDER PACKET: 17 17 gram | ORAL | Qty: 1

## 2018-08-12 MED FILL — LORAZEPAM 0.5 MG TABLET: 0.5 0.5 MG | ORAL | Qty: 1

## 2018-08-12 MED FILL — LIDODERM 5 % TOPICAL PATCH: 5 5 % | TOPICAL | Qty: 1

## 2018-08-12 MED FILL — LORAZEPAM 1 MG TABLET: 1 1 MG | ORAL | Qty: 1

## 2018-08-12 MED FILL — CEFEPIME 2 GRAM SOLUTION FOR INJECTION: 2 2 gram | INTRAMUSCULAR | Qty: 1

## 2018-08-12 MED FILL — PRAZOSIN 1 MG CAPSULE: 1 1 MG | ORAL | Qty: 1

## 2018-08-12 NOTE — Unmapped (Signed)
[facility]  DEPARTMENT OF INTERNAL MEDICINE  DAILY PROGRESS NOTE    Chief Complaint / Reason for Follow-Up     Anne Goodwin is a 37 y.o. female on hospital day 20.  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    Interval History / Subjective     No acute events overnight. This morning, patient says her withdrawal symptoms are greatly improved. She can now walk without excruciating pain. She has no other concerns or questions.    Review of Systems     Review of Systems   Constitutional: Negative for chills, diaphoresis and fever.   Respiratory: Negative for cough, shortness of breath and wheezing.    Cardiovascular: Negative for chest pain and palpitations.   Gastrointestinal: Negative for abdominal pain, constipation, diarrhea, nausea and vomiting.   Genitourinary: Negative for dysuria and hematuria.     Medications     Scheduled Meds:  ??? acetaminophen  975 mg Oral 3 times per day   ??? buprenorphine HCl  16 mg Sublingual Daily 0900   ??? ceFEPime (MAXIPIME) IV extended infusion  2 g Intravenous Q12H   ??? escitalopram oxalate  5 mg Oral Daily 0900   ??? gabapentin  800 mg Oral TID   ??? lidocaine  1 patch Transdermal Q24H   ??? LORazepam  0.5 mg Oral Q6HWA    And   ??? LORazepam  1 mg Oral Nightly (2100)   ??? melatonin  6 mg Oral Nightly (2100)   ??? prazosin  1 mg Oral Nightly (2100)   ??? senna-docusate  2 tablet Oral BID     Continuous Infusions:  PRN Meds:cloNIDine HCl, cyclobenzaprine, hydrOXYzine HCl, loperamide, naloxone, polyethylene glycol, traZODone    Vital Signs     Temp:  [98.1 ??F (36.7 ??C)-98.3 ??F (36.8 ??C)] 98.1 ??F (36.7 ??C)  Heart Rate:  [89-107] 107  Resp:  [16-18] 16  BP: (107-109)/(58-78) 108/58    Intake/Output Summary (Last 24 hours) at 08/12/2018 1108  Last data filed at 08/11/2018 1757  Gross per 24 hour   Intake 480 ml   Output --   Net 480 ml       Physical Exam     Physical Exam  Constitutional:       General: She is not in acute distress.     Appearance: She is  well-developed and well-nourished. She is not diaphoretic.   HENT:      Head: Normocephalic and atraumatic.      Right Ear: External ear normal.      Left Ear: External ear normal.   Eyes:      General: No scleral icterus.        Right eye: No discharge.         Left eye: No discharge.      Conjunctiva/sclera: Conjunctivae normal.   Neck:      Vascular: No JVD.      Trachea: No tracheal deviation.   Cardiovascular:      Rate and Rhythm: Normal rate and regular rhythm.      Heart sounds: Normal heart sounds.   Pulmonary:      Effort: Pulmonary effort is normal.      Breath sounds: Normal breath sounds.   Abdominal:      General: Bowel sounds are normal.      Palpations: Abdomen is soft.      Tenderness: There is no tenderness. There is no guarding.   Musculoskeletal:  General: No edema.   Skin:     General: Skin is warm and dry.   Neurological:      Mental Status: She is alert and oriented to person, place, and time.   Psychiatric:         Mood and Affect: Mood and affect normal.         Behavior: Behavior normal.       Laboratory Data         Lab 08/10/18  0635 08/07/18  0442   WBC 4.0 4.7   4.7   HEMOGLOBIN 12.1 11.1*   11.1*   HEMATOCRIT 36.0 32.9*   32.9*   MEAN CORPUSCULAR VOLUME 83.9 83.7   83.6   PLATELETS 178 198   201         Lab 08/10/18  0635 08/07/18  0442   SODIUM 137 140   140   POTASSIUM 4.3 4.3   4.3   CHLORIDE 100 101   101   CO2 30 30   30    BUN 14 22   22    CREATININE 0.52* 0.50*   0.50*   GLUCOSE 97 104*   104*   CALCIUM 10.0 10.1   10.1   MAGNESIUM 2.1  --    PHOSPHORUS 4.6  --              Lab 08/07/18  0442   ALT 11   AST 14   ALK PHOS 51   BILIRUBIN TOTAL 0.3   ALBUMIN 3.8       Diagnostic Studies     No new imaging     Assessment & Plan     Anne Goodwin is a 37 y.o. female on HD# 20 with Infective myositis.  The medical issues being addressed in today's encounter are as follows:    * Infective myositis    Hospital Course  Patient will need 4 weeks of IV antibiotics per  Infectious Disease    Assessment and Plan for 08/12/2018   Improving-Patient able to ambulate without crutches and without pain.  -Continue cefepime, tentative end date is 08/22/18  - Safety labs every Monday: ESR, CRP, complete metabolic panel, CBC with differential  - Pain management: Addiction services following, appreciate recommendations    - Subutex 16mg  in AM daily              - Scheduled acetaminophen              - Lidocaine patch              - Senna-docusate two tablets twice daily for constipation prophylaxis              - Miralaix BID as needed for constipation              - Increased gabapentin to 600 gm TID     Normocytic anemia    Hospital Course  May be anemia of chronic inflammation given osteomyelitis/infection    Assessment and Plan for 08/12/2018  Treat underlying cause  -Follow-up outpatient     Type 2 diabetes mellitus (CMS Dx)    Hospital Course  HbA1c 6.6%. Has been on regular diet and patient's glucose levels have all been at goal for her entire stay.     Assessment and Plan for 08/12/2018  -Can reassess if clinical risks of hyperglycemia change during stay.    Insomnia    Hospital Course  Patient having difficult sleeping     Assessment and  Plan for 08/12/2018  Psychiatry following   - Continue melatonin  - Continue prazosin 1 mg at bedtime for PTSD nightmares  - Continue trazodone 25 mg at bedtime as needed for sleep    Anxiety and depression    Hospital Course  Psychiatry consulted     Assessment and Plan for 08/12/2018   Patient less anxious on assessment today  - Continue hydroxyzine as needed for anxiety  - Continue escitalopram  - Psychiatry - cognitive behavioral therapy     Opioid abuse (CMS Dx)    Hospital Course  Injects heroin and fentanyl     Assessment and Plan for 08/12/2018  Addiction services following, appreciate recommendations  -Subutex 16mg  daily, 8mg  AM + 8mg  at nighttime, will change to Subutex 16mg  morning dose tomorrow  -Hydroxyzine, clonidine and ativan PRN for  anxuiety, agitation and restlessness. Will touch base with psych about tapering Ativan   -Imodium PRN for diarrhea   -Changed Ativan to 0.5mg  at 0900, 1300, 1700; 1mg  at bedtime    Right forearm abscess     Hospital Course  Drained by ACS 07/27/18. No culture sent    Assessment and Plan for 08/12/2018   Completed 7 day course of doxycycline       Nutrition:   Diet Orders          Diet regular starting at 09/24 1245          Code Status: Full Code    Signed:  ??  By signing my name below, I, Sidonie Dickens, attest that this documentation has been prepared under the direction and in the presence of Geralynn Ochs, MD, PhD, FACP  Scribe name: Sidonie Dickens Date: 08/12/2018    ??  ----  Attending Physician Attestation  ??  The scribe???s documentation has been prepared under my direction and personally reviewed by me in its entirety. I confirm that the note above accurately reflects all work, treatment, procedures, and medical decision making performed by me.      Geralynn Ochs, MD, PhD, FACP   Department of Internal Medicine  11:08 AM, 08/12/2018

## 2018-08-12 NOTE — Unmapped (Signed)
Patient remains injury free. Side rails raised. Bed in lowest position with bed alarm on. Bed wheels locked and in place. Call light and personal items within reach. Floor free of clutter. Fall risk precaution in place and patient educated. Continue to monitor

## 2018-08-13 MED FILL — TRAZODONE 25 MG DOSE: 50 50 MG | ORAL | Qty: 1

## 2018-08-13 MED FILL — CEFEPIME 2 GRAM SOLUTION FOR INJECTION: 2 2 gram | INTRAMUSCULAR | Qty: 1

## 2018-08-13 MED FILL — SENNOSIDES 8.6 MG-DOCUSATE SODIUM 50 MG TABLET: 8.6-50 8.6-50 mg | ORAL | Qty: 2

## 2018-08-13 MED FILL — MELATONIN 3 MG TABLET: 3 3 mg | ORAL | Qty: 2

## 2018-08-13 MED FILL — PRAZOSIN 1 MG CAPSULE: 1 1 MG | ORAL | Qty: 1

## 2018-08-13 MED FILL — TYLENOL 325 MG TABLET: 325 325 mg | ORAL | Qty: 3

## 2018-08-13 MED FILL — LIDODERM 5 % TOPICAL PATCH: 5 5 % | TOPICAL | Qty: 1

## 2018-08-13 MED FILL — GABAPENTIN 400 MG CAPSULE: 400 400 MG | ORAL | Qty: 2

## 2018-08-13 MED FILL — ESCITALOPRAM 10 MG TABLET: 10 10 MG | ORAL | Qty: 1

## 2018-08-13 MED FILL — BUPRENORPHINE HCL 8 MG SUBLINGUAL TABLET: 8 8 mg | SUBLINGUAL | Qty: 2

## 2018-08-13 MED FILL — LORAZEPAM 0.5 MG TABLET: 0.5 0.5 MG | ORAL | Qty: 1

## 2018-08-13 MED FILL — LORAZEPAM 1 MG TABLET: 1 1 MG | ORAL | Qty: 1

## 2018-08-13 MED FILL — CYCLOBENZAPRINE 5 MG TABLET: 5 5 MG | ORAL | Qty: 1

## 2018-08-13 NOTE — Unmapped (Signed)
Nursing Overnight Progress Note    Significant Events During Shift  none    Patient/Family Concerns  Evening/Overnight Visitors: none  Concerns: none    Assessment  Nursing time demands: low  IV access: has IV access, adequate and functioning  Sitter requirements:  no    Mental Status  Mental Status for the past 14 hrs:   Level of Consciousness Orientation Level Cognition   08/12/18 2100 Alert Oriented X4 Ability to abstract;Appropriate judgement       Calls to Physician Team  No data found.    Medications  [phone] - Medications Not Given  (last 12 hrs)         ** SITE UNKNOWN **       Medication Name Action Time Action Reason Comments     acetaminophen (TYLENOL) tablet 975 mg 08/13/18 0418 Not Given Patient/family refused                 Diet/Meals Consumed (past 24 hours)  Nutrition Assessment for the past 24 hrs:   Diet Type Feeding Percent Meals Eaten (%)   08/12/18 0930 Regular Able to feed self 100 %   08/12/18 1330 -- -- 100 %   08/12/18 1618 -- -- 100 %

## 2018-08-13 NOTE — Unmapped (Signed)
Patient remains injury free. Side rails raised. Bed in lowest position with bed alarm on. Bed wheels locked and in place. Call light and personal items within reach. Floor free of clutter. Fall risk precaution in place and patient educated. Continue to monitor

## 2018-08-13 NOTE — Unmapped (Signed)
Today's MD Update for patient discharge planning:    MD reports pt to d/c on Monday 08/14/18.    Liberty Handy, MSW, [facility]  Care Coordination  Medical Social Worker  [phone]

## 2018-08-14 LAB — CBC
Hematocrit: 34.5 % (ref 35.0–45.0)
Hemoglobin: 11.7 g/dL (ref 11.7–15.5)
MCH: 28.2 pg (ref 27.0–33.0)
MCHC: 34 g/dL (ref 32.0–36.0)
MCV: 83.1 fL (ref 80.0–100.0)
MPV: 8.4 fL (ref 7.5–11.5)
Platelets: 154 10*3/uL (ref 140–400)
RBC: 4.15 10*6/uL (ref 3.80–5.10)
RDW: 15.1 % (ref 11.0–15.0)
WBC: 3 10*3/uL (ref 3.8–10.8)

## 2018-08-14 LAB — COMPREHENSIVE METABOLIC PANEL
ALT: 12 U/L (ref 7–52)
AST: 14 U/L (ref 13–39)
Albumin: 4.4 g/dL (ref 3.5–5.7)
Alkaline Phosphatase: 65 U/L (ref 36–125)
Anion Gap: 8 mmol/L (ref 3–16)
BUN: 13 mg/dL (ref 7–25)
CO2: 29 mmol/L (ref 21–33)
Calcium: 9.9 mg/dL (ref 8.6–10.3)
Chloride: 101 mmol/L (ref 98–110)
Creatinine: 0.45 mg/dL — ABNORMAL LOW (ref 0.60–1.30)
Glucose: 107 mg/dL — ABNORMAL HIGH (ref 70–100)
Osmolality, Calculated: 287 mosm/kg (ref 278–305)
Potassium: 4 mmol/L (ref 3.5–5.3)
Sodium: 138 mmol/L (ref 133–146)
Total Bilirubin: 0.2 mg/dL (ref 0.0–1.5)
Total Protein: 7.6 g/dL (ref 6.4–8.9)
eGFR AA CKD-EPI: >90 See note.
eGFR NONAA CKD-EPI: >90 See note.

## 2018-08-14 LAB — DIFFERENTIAL
Basophils Absolute: 21 /uL (ref 0–200)
Basophils Relative: 0.7 % (ref 0.0–1.0)
Eosinophils Absolute: 96 /uL (ref 15–500)
Eosinophils Relative: 3.2 % (ref 0.0–8.0)
Lymphocytes Absolute: 1812 /uL (ref 850–3900)
Lymphocytes Relative: 60.4 % — ABNORMAL HIGH (ref 15.0–45.0)
Monocytes Absolute: 306 /uL (ref 200–950)
Monocytes Relative: 10.2 % (ref 0.0–12.0)
Neutrophils Absolute: 765 /uL — ABNORMAL LOW (ref 1500–7800)
Neutrophils Relative: 25.5 % — ABNORMAL LOW (ref 40.0–80.0)
nRBC: 0 /100{WBCs} (ref 0–0)

## 2018-08-14 LAB — C-REACTIVE PROTEIN: CRP: 2.3 mg/L (ref 1.0–10.0)

## 2018-08-14 LAB — SED RATE: Sed Rate: 28 mm/hr (ref 0–20)

## 2018-08-14 MED ORDER — LORazepam (ATIVAN) 0.5 MG tablet
0.5 | ORAL_TABLET | Freq: Three times a day (TID) | ORAL | 0 refills | Status: AC
Start: 2018-08-14 — End: 2018-09-13
  Filled 2018-08-14: qty 90, 30d supply, fill #0

## 2018-08-14 MED ORDER — ciprofloxacin HCl (CIPRO) 750 MG tablet
750 | ORAL_TABLET | Freq: Two times a day (BID) | ORAL | 0 refills | Status: AC
Start: 2018-08-14 — End: 2018-08-21
  Filled 2018-08-14: qty 14, 7d supply, fill #0

## 2018-08-14 MED ORDER — prazosin (MINIPRESS) 1 MG capsule
1 | ORAL_CAPSULE | Freq: Every evening | ORAL | 0 refills | Status: AC
Start: 2018-08-14 — End: ?
  Filled 2018-08-14: qty 30, 30d supply, fill #0

## 2018-08-14 MED ORDER — cyclobenzaprine (FLEXERIL) 5 MG tablet
5 | ORAL_TABLET | Freq: Two times a day (BID) | ORAL | 0 refills | Status: AC | PRN
Start: 2018-08-14 — End: ?
  Filled 2018-08-14: qty 60, 30d supply, fill #0

## 2018-08-14 MED ORDER — escitalopram oxalate (LEXAPRO) 5 MG tablet
5 | ORAL_TABLET | Freq: Every day | ORAL | 0 refills | Status: AC
Start: 2018-08-14 — End: ?
  Filled 2018-08-14: qty 30, 30d supply, fill #0

## 2018-08-14 MED ORDER — senna-docusate (SENNA-S) 8.6-50 mg per tablet
8.6-50 | ORAL_TABLET | Freq: Two times a day (BID) | ORAL | 0 refills | Status: AC
Start: 2018-08-14 — End: ?
  Filled 2018-08-14: qty 30, 8d supply, fill #0

## 2018-08-14 MED ORDER — buprenorphine HCl (SUBUTEX) 8 mg Subl
8 | ORAL_TABLET | Freq: Every day | SUBLINGUAL | 0 refills | Status: AC
Start: 2018-08-14 — End: 2018-08-21
  Filled 2018-08-14: qty 14, 7d supply, fill #0

## 2018-08-14 MED ORDER — hydrOXYzine HCl (ATARAX) 10 MG tablet
10 | ORAL_TABLET | ORAL | 0 refills | Status: AC | PRN
Start: 2018-08-14 — End: ?
  Filled 2018-08-14: qty 30, 5d supply, fill #0

## 2018-08-14 MED ORDER — gabapentin (NEURONTIN) 400 MG capsule
400 | ORAL_CAPSULE | Freq: Three times a day (TID) | ORAL | 0 refills | Status: AC
Start: 2018-08-14 — End: ?
  Filled 2018-08-14: qty 90, 15d supply, fill #0

## 2018-08-14 MED FILL — CEFEPIME 2 GRAM SOLUTION FOR INJECTION: 2 2 gram | INTRAMUSCULAR | Qty: 1

## 2018-08-14 MED FILL — BUPRENORPHINE HCL 8 MG SUBLINGUAL TABLET: 8 8 mg | SUBLINGUAL | Qty: 2

## 2018-08-14 MED FILL — GABAPENTIN 400 MG CAPSULE: 400 400 MG | ORAL | Qty: 2

## 2018-08-14 MED FILL — LORAZEPAM 0.5 MG TABLET: 0.5 0.5 MG | ORAL | Qty: 1

## 2018-08-14 MED FILL — ESCITALOPRAM 10 MG TABLET: 10 10 MG | ORAL | Qty: 1

## 2018-08-14 MED FILL — TYLENOL 325 MG TABLET: 325 325 mg | ORAL | Qty: 3

## 2018-08-14 MED FILL — SENNOSIDES 8.6 MG-DOCUSATE SODIUM 50 MG TABLET: 8.6-50 8.6-50 mg | ORAL | Qty: 2

## 2018-08-14 NOTE — Nursing Note (Signed)
Discharge orders received, patient updated of discharge plan and stated she would arrange for a ride a home. [facility] contacted for medications. RN removed PICC line with no issues. Patient rating left hip pain at 5/10 states it is tolerable and is able to ambulate with steady gait without walker. AVS printed reviewed and signed. RN walked patient off unit to lobby exit with all belongings at 1057. No other issues or complaints at time of discharge.

## 2018-08-14 NOTE — Unmapped (Signed)
[facility]  Department of Internal Medicine  Inpatient Discharge Summary    Patient: Anne Goodwin   MRN: [id number]   CSN: [id number]    Date of Admission: 07/23/2018  Date of Discharge: 08/14/18  Attending Physician: Sherlyn Hay, MD    Diagnoses Present on Admission     Past Medical History:   Diagnosis Date   ??? Abnormal Pap smear    ??? Anxiety    ??? Back pain    ??? Bipolar disorder (CMS Dx)    ??? Depression    ??? Diabetes mellitus (CMS Dx)    ??? Ectopic pregnancy    ??? Hashimoto's thyroiditis    ??? OCD (obsessive compulsive disorder)    ??? Pituitary adenoma (CMS Dx)       Discharge Diagnoses     Active Hospital Problems    Diagnosis Date Noted   ??? Infective myositis [M60.009] 07/23/2018   ??? Opioid abuse (CMS Dx) [F11.10] 08/08/2018   ??? Anxiety and depression [F41.9, F32.9] 08/08/2018   ??? Insomnia [G47.00] 08/08/2018   ??? Type 2 diabetes mellitus (CMS Dx) [E11.9] 08/08/2018   ??? Normocytic anemia [D64.9] 08/08/2018      Resolved Hospital Problems    Diagnosis Date Noted Date Resolved   ??? Right forearm abscess  [L02.91] 08/08/2018 08/14/2018     Operations/Procedures Performed (include dates)     Surgeries: None     Lines/Drains/Airways:  Patient Lines/Drains/Airways Status    Active Line / PIV Line     None              Notable Imaging Studies:  MRI Pelvis W/ and WO contrast 07/25/18  IMPRESSION  Septic arthritis of the left sacroiliac joint without an associated drainable fluid collection.    MRI Lumbar spine 07/23/18  Limited study with no suggestion of discitis osteomyelitis in the lumbar spine.    X-ray pelvis 07/23/18   Impression: No acute abnormality     Other Procedures:  1. None      Consulting Services (include reason)     1. ID-Septic arthritis/myositis left  2. Behavior health - Opioid use  3. ACS- RUE abscess       Allergies     Latex, Natural Rubber      Discharge Medications        Medication List      TAKE these medications, which are NEW      Quantity/Refills   cyclobenzaprine 5  MG tablet  Commonly known as:  FLEXERIL  Take 1 tablet (5 mg total) by mouth 2 times a day as needed for Muscle spasms.   Quantity:  60 tablet  Refills:  0     escitalopram oxalate 5 MG tablet  Commonly known as:  LEXAPRO  Take 1 tablet (5 mg total) by mouth daily.   Quantity:  30 tablet  Refills:  0     gabapentin 400 MG capsule  Commonly known as:  NEURONTIN  Take 2 capsules (800 mg total) by mouth 3 times a day.   Quantity:  90 capsule  Refills:  0     hydrOXYzine HCl 10 MG tablet  Commonly known as:  ATARAX  Take 1 tablet (10 mg total) by mouth every 4 hours as needed.   Quantity:  30 tablet  Refills:  0     prazosin 1 MG capsule  Commonly known as:  MINIPRESS  Take 1 capsule (1 mg total) by mouth at bedtime.   Quantity:  30 capsule  Refills:  0     SENNA-S 8.6-50 mg per tablet  Generic drug:  senna-docusate  Take 2 tablets by mouth 2 times a day.   Quantity:  30 tablet  Refills:  0        ASK your doctor about these medications      Quantity/Refills   buprenorphine HCL 8 mg Subl  Commonly known as:  SUBUTEX  Place 2 tablets (16 mg total) under the tongue daily for 7 days.  Ask about: Should I take this medication?   Quantity:  14 tablet  Refills:  0     ciprofloxacin HCl 750 MG tablet  Commonly known as:  CIPRO  Take 1 tablet (750 mg total) by mouth 2 times a day for 7 days.  Ask about: Should I take this medication?   Quantity:  14 tablet  Refills:  0     LORazepam 0.5 MG tablet  Commonly known as:  ATIVAN  Take 1 tablet (0.5 mg total) by mouth 3 times a day for 30 days.  Ask about: Should I take this medication?   Quantity:  90 tablet  Refills:  0           Where to Get Your Medications      These medications were sent to [facility]  [address] [place], [place] [address]    Hours:  Monday - Friday: 8:45AM - 5:00PM Phone:  [phone]   ?? buprenorphine HCL 8 mg Subl  ?? ciprofloxacin HCl 750 MG tablet  ?? cyclobenzaprine 5 MG tablet  ?? escitalopram oxalate 5 MG tablet  ?? gabapentin 400  MG capsule  ?? hydrOXYzine HCl 10 MG tablet  ?? LORazepam 0.5 MG tablet  ?? prazosin 1 MG capsule  ?? SENNA-S 8.6-50 mg per tablet           Reason for Admission     Anne Goodwin is a 37 y.o. female with PMH of IV drug abuse presented with left-sided gluteal and low back pain.    Hospital Course By Problem     #Infective myositis- Patient will need 4 weeks of IV antibiotics per Infectious Disease. Improved as patient able to ambulate without crutches. Continue cefepime, tentative end date 08/22/18. Addiction services was consulted     #Normocytic anemia- May be anemia of chronic inflammation given osteomyelitis/infection. Treated underlying cause. Will need outpatient follow up    #T2DM- HbA1c 6.6%. Has been on regular diet and patient's glucose levels have all been at goal for her entire stay.     #Insomnia- Patient had difficulty sleeping. Psych involved continued melatonin, prazosin 1mg  at bedtime for PTSD, and trazodone 25mg  PRN     #Anxiety and depression- Psych involved. Continued hydroxyzine as needed for anxiety, escitalopram.    #Opioid use- Patient injects heroin and fentanyl. Addiction services consulted, patient was on Subutex. Hydroxyzine, clonidine and ativan PRN for anxuiety, agitation and restlessness. Imodium PRN for diarrhea     #Right forearm abscess- Drained by ACS on 07/27/18. Completed 7 day course of doxycycline       Condition on Discharge     1. Functional Status: Normal    2. Mental Status: Normal    3. Diet / Tube Feeding / TPN:  Diet Orders          None        As listed above    4. Respiratory / Lines & Tubes / Wounds:  None required  5. Discharge Physical Exam:  BP 123/84 (BP Location: Left arm, Patient Position: Lying)    Pulse 87    Temp 98.1 ??F (36.7 ??C) (Oral)    Resp 20    Ht 5' 5 (1.651 m)    Wt 130 lb (59 kg)    SpO2 99%    BMI 21.63 kg/m??    Physical Exam  Constitutional:       General: She is not in acute distress.     Appearance: She is well-developed and well-nourished.  She is not diaphoretic.   HENT:      Head: Normocephalic and atraumatic.      Right Ear: External ear normal.      Left Ear: External ear normal.   Eyes:      General: No scleral icterus.        Right eye: No discharge.         Left eye: No discharge.      Conjunctiva/sclera: Conjunctivae normal.   Neck:      Vascular: No JVD.      Trachea: No tracheal deviation.   Cardiovascular:      Rate and Rhythm: Normal rate and regular rhythm.      Heart sounds: Normal heart sounds.   Pulmonary:      Effort: Pulmonary effort is normal.      Breath sounds: Normal breath sounds.   Abdominal:      General: Bowel sounds are normal.      Palpations: Abdomen is soft.      Tenderness: There is no tenderness. There is no guarding.   Musculoskeletal:         General: No edema.   Skin:     General: Skin is warm and dry.   Neurological:      Mental Status: She is alert and oriented to person, place, and time.   Psychiatric:         Mood and Affect: Mood and affect normal.         Behavior: Behavior normal.     Disposition     Home independent      Follow-Up Appointments     No future appointments.    Gardiner Coins, CNP  [address]  [place] [address] [place] [address]  [phone]          Patient Instructions / Follow-Up Items for Receiving Physician             Signed:    By signing my name below, I, Sumata Bhimani , attest that this documentation has been prepared under the direction and in the presence of Sherlyn Hay, MD   Scribe name: Leia Alf Date: 11/15/2018       ----  Attending Physician Attestation    Total time spent on completing discharge >30 minutes      The scribe???s documentation has been prepared under my direction and personally reviewed by me in its entirety. I confirm that the note above accurately reflects all work, treatment, procedures, and medical decision making performed by me.     Sherlyn Hay, MD  Department of Internal Medicine  3:43 PM, 11/15/2018

## 2018-08-14 NOTE — Unmapped (Signed)
Subspecialty Follow-up Appointment Chart Note    Service: Infectious Diseases    Subspecialty Follow-up Disposition:  The patient does not require subspecialty follow-up.    Arbie Cookey, MD  08/14/2018  11:19 AM

## 2018-08-14 NOTE — Unmapped (Signed)
Nursing Overnight Progress Note    Significant Events During Shift  none    Patient/Family Concerns  Evening/Overnight Visitors: none  Concerns: none    Assessment  Nursing time demands: low  IV access: has IV access, adequate and functioning  Sitter requirements:  no    Mental Status  Mental Status for the past 14 hrs:   Level of Consciousness Orientation Level Cognition   08/13/18 2046 Alert Oriented X4 Follows simple commands;Follows 2-step commands       Calls to Physician Team  No data found.    Medications  [phone] - Medications Not Given  (last 12 hrs)         ** No medications to display **          Diet/Meals Consumed (past 24 hours)  Nutrition Assessment for the past 24 hrs:   Diet Type Feeding Percent Meals Eaten (%) Appetite   08/13/18 1236 Regular Able to feed self -- Good   08/13/18 2048 Regular Able to feed self 75 % Good

## 2018-08-14 NOTE — Unmapped (Signed)
INFECTIOUS DISEASE PROGRESS NOTE    08/14/2018  11:16 AM    Anne Goodwin is a 37 y.o. female patient. Hospital Day: 22     Chief Complaint/Reason for Follow-up: Myositis    Subjective:    Ambulating freely  No fevers or chills  No chest pain  Planned for discharge today      Review of Systems   Constitutional: Positive for fatigue. Negative for chills and fever.   HENT: Negative for mouth sores.    Respiratory: Negative for shortness of breath.    Cardiovascular: Negative for chest pain and leg swelling.   Gastrointestinal: Negative for abdominal pain.   Genitourinary: Negative for dysuria.   Musculoskeletal: Positive for back pain and myalgias.       Objective:  Vital signs:  Vitals:    08/14/18 0756   BP: 123/84   Pulse: 87   Resp: 20   Temp: 98.1 ??F (36.7 ??C)   SpO2: 99%     Temp last 24 hours:Temp (24hrs), Avg:98 ??F (36.7 ??C), Min:97.8 ??F (36.6 ??C), Max:98.1 ??F (36.7 ??C)    Scheduled Meds:  ??? acetaminophen  975 mg Oral 3 times per day   ??? buprenorphine HCl  16 mg Sublingual Daily 0900   ??? ceFEPime (MAXIPIME) IV extended infusion  2 g Intravenous Q12H   ??? escitalopram oxalate  5 mg Oral Daily 0900   ??? gabapentin  800 mg Oral TID   ??? lidocaine  1 patch Transdermal Q24H   ??? LORazepam  0.5 mg Oral TID    And   ??? LORazepam  1 mg Oral Nightly (2100)   ??? melatonin  6 mg Oral Nightly (2100)   ??? prazosin  1 mg Oral Nightly (2100)   ??? senna-docusate  2 tablet Oral BID     Continuous Infusions:  PRN Meds:cloNIDine HCl, cyclobenzaprine, hydrOXYzine HCl, loperamide, naloxone, polyethylene glycol, traZODone    Intake/Output last 3 shifts:  Date 08/13/18 0700 - 08/14/18 0659 08/14/18 0700 - 08/15/18 0659(Discharged)   Shift 2956-2130 1500-2259 2300-0659 24 Hour Total 0700-1459 1500-2259 2300-0659 24 Hour Total   INTAKE   P.O.  240  240 240   240     P.O.  240  240 240   240   Shift Total(mL/kg)  240(4.1)  240(4.1) 240(4.1)   240(4.1)   OUTPUT   Urine(mL/kg/hr)             Urine Occurrence 1 x 4 x  5 x       Stool              Stool Occurrence   0 x 0 x       Shift Total(mL/kg)           Weight (kg) 59 59 59 59 59 59 59 59       Physical Exam   Nursing note and vitals reviewed.  Constitutional: No distress.   HENT:   Mouth/Throat: No oropharyngeal exudate.   Eyes: Pupils are equal, round, and reactive to light. No scleral icterus.   Neck: Neck supple. No JVD present.   Cardiovascular: Normal rate, regular rhythm, normal heart sounds and intact distal pulses.   Pulmonary/Chest: Effort normal and breath sounds normal. She has no rales.   Abdominal: Soft. Bowel sounds are normal. There is no tenderness. Musculoskeletal:         General: No edema.      Comments: Minimal tenderness left SI paraspinal region with radiation to left buttock  Labs:       Lab 08/14/18  0455   WBC 3.0*   HEMOGLOBIN 11.7   HEMATOCRIT 34.5*   MEAN CORPUSCULAR VOLUME 83.1   PLATELETS 154        Lab 08/14/18  0455 08/10/18  0635   SODIUM 138 137   POTASSIUM 4.0 4.3   CHLORIDE 101 100   CO2 29 30   BUN 13 14   CREATININE 0.45* 0.52*   GLUCOSE 107* 97   CALCIUM 9.9 10.0   MAGNESIUM  --  2.1   PHOSPHORUS  --  4.6          Lab 08/14/18  0455   ALK PHOS 65   AST 14   ALT 12   BILIRUBIN TOTAL 0.2             Results for Anne Goodwin, Anne Goodwin (MRN [id number]) as of 08/07/2018 21:18   Ref. Range 07/23/2018 09:59 07/31/2018 03:53 08/07/2018 04:42 08/07/2018 04:42   CRP Latest Ref Range: 1.0 - 10.0 mg/L 86.3 (H) 4.8 2.2 2.3   Results for Anne Goodwin, Anne Goodwin (MRN [id number]) as of 08/07/2018 21:18   Ref. Range 07/23/2018 09:59 07/31/2018 03:53 08/07/2018 04:42 08/07/2018 04:42   Sed Rate by Modified Westergren Latest Ref Range: 0 - 20 mm/hr 75 (H) 27 (H) 29 (H) 33 (H)       Assessment/Plan:  Principal Problem:    Infective myositis  Active Problems:    Opioid abuse (CMS Dx)    Anxiety and depression    Insomnia    Type 2 diabetes mellitus (CMS Dx)    Normocytic anemia    Septic arthritis/myositis left   Prior serratia bacteremia   Responding to therapy  Ok for  conversion/discharge today cipro 500 mg q12 x 7 additional days     No scheduled ID follow up necessary    Substance abuse    Followed by addiction services      Arbie Cookey, MD  08/14/2018  Pager [phone]

## 2018-08-29 ENCOUNTER — Encounter: Payer: PRIVATE HEALTH INSURANCE | Attending: Gerontology | Primary: Family

## 2018-08-31 ENCOUNTER — Encounter: Payer: PRIVATE HEALTH INSURANCE | Attending: Gerontology | Primary: Family

## 2018-09-13 NOTE — Unmapped (Signed)
ID NO SHOW PHONE CALL / HDF    Left VM asking patient to call [phone] & choose option 1 to reschedule visit with Suzzanne Cloud, NP.

## 2019-08-16 ENCOUNTER — Ambulatory Visit
Admit: 2019-08-16 | Discharge: 2019-08-16 | Payer: PRIVATE HEALTH INSURANCE | Attending: Family Medicine | Primary: Family Medicine

## 2019-08-16 DIAGNOSIS — F1910 Other psychoactive substance abuse, uncomplicated: Secondary | ICD-10-CM

## 2019-08-16 LAB — COMPREHENSIVE METABOLIC PANEL
ALT: 150 U/L — ABNORMAL HIGH (ref 10–40)
AST: 63 U/L — ABNORMAL HIGH (ref 15–37)
Albumin/Globulin Ratio: 1.8 (ref 1.1–2.2)
Albumin: 4.1 g/dL (ref 3.4–5.0)
Alkaline Phosphatase: 85 U/L (ref 40–129)
Anion Gap: 9 (ref 3–16)
BUN: 15 mg/dL (ref 7–20)
CO2: 29 mmol/L (ref 21–32)
Calcium: 9.7 mg/dL (ref 8.3–10.6)
Chloride: 101 mmol/L (ref 99–110)
Creatinine: 0.7 mg/dL (ref 0.6–1.1)
GFR African American: >60 (ref >60)
GFR Non-African American: >60 (ref >60)
Globulin: 2.3 g/dL
Glucose: 83 mg/dL (ref 70–99)
Potassium: 4.8 mmol/L (ref 3.5–5.1)
Sodium: 139 mmol/L (ref 136–145)
Total Bilirubin: <0.2 mg/dL (ref 0.0–1.0)
Total Protein: 6.4 g/dL (ref 6.4–8.2)

## 2019-08-16 LAB — CBC WITH AUTO DIFFERENTIAL
Basophils %: 1 %
Basophils Absolute: 0.1 10*3/uL (ref 0.0–0.2)
Eosinophils %: 3.1 %
Eosinophils Absolute: 0.2 10*3/uL (ref 0.0–0.6)
Hematocrit: 41.2 % (ref 36.0–48.0)
Hemoglobin: 13.6 g/dL (ref 12.0–16.0)
Lymphocytes %: 51.5 %
Lymphocytes Absolute: 2.8 10*3/uL (ref 1.0–5.1)
MCH: 29.5 pg (ref 26.0–34.0)
MCHC: 33.1 g/dL (ref 31.0–36.0)
MCV: 89.3 fL (ref 80.0–100.0)
MPV: 9.3 fL (ref 5.0–10.5)
Monocytes %: 7 %
Monocytes Absolute: 0.4 10*3/uL (ref 0.0–1.3)
Neutrophils %: 37.4 %
Neutrophils Absolute: 2 10*3/uL (ref 1.7–7.7)
Platelets: 182 10*3/uL (ref 135–450)
RBC: 4.61 M/uL (ref 4.00–5.20)
RDW: 13.4 % (ref 12.4–15.4)
WBC: 5.4 10*3/uL (ref 4.0–11.0)

## 2019-08-16 MED ORDER — FREESTYLE SYSTEM KIT
PACK | Freq: Every day | 0 refills | Status: DC
Start: 2019-08-16 — End: 2019-08-20

## 2019-08-16 MED ORDER — ONETOUCH ULTRASOFT LANCETS MISC
3 refills | Status: DC
Start: 2019-08-16 — End: 2021-06-16

## 2019-08-16 MED ORDER — GLUCOSE BLOOD VI STRP
3 refills | Status: DC
Start: 2019-08-16 — End: 2019-08-20

## 2019-08-16 MED ORDER — ONETOUCH ULTRA 2 W/DEVICE KIT
PACK | 0 refills | Status: DC
Start: 2019-08-16 — End: 2019-08-20

## 2019-08-16 NOTE — Telephone Encounter (Signed)
I sent them again.  Dr. Pricilla Holm

## 2019-08-16 NOTE — Progress Notes (Signed)
08/16/2019    [name] [name] (DOB:  11-Feb-1981) is a 38 y.o. female, here for evaluation of the following medical concerns:    HPI     1.  Establish care- patient presented to the clinic to establish care, she is a poor historian and is unsure of her history, she seemed confused that she established with a PCP in May, she has recently stopped using Heroin for the past week, patient is taking Suboxone.    2.  Opiate history- patient is currently seeing Suboxone clinic, was on Fentanyl, has been 2003-2020, been off the past week.  Dr. Sherryll Burger office, [place]    3.  DM II- patient claims she is a diabetic but is not on medication? She is asking for a  Glucometer, is not on insulin, is needing A1C checked today, hasn't kept a log of her glucose readings.     4.  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  Stated that she has a fullness in her throat area, no problem swallowing, no allergic reaction, patient is very focused on this, no erythema noted, no edema noted .    5.  Mixed obsessional thoughts and acts, agoraphobia, PTSD, anxiety, not following with a Psychiatrist at this time?, no side effects from the medication at this time, but needs to get in with someone as soon as possible.     Today, denied  Chest pain, sob, n, v, or diarrhea.     Review of Systems   Constitutional: Negative for activity change, fatigue, fever and unexpected weight change.   HENT: Negative for congestion, rhinorrhea, sinus pressure, sore throat, trouble swallowing and voice change.    Eyes: Negative for visual disturbance.   Respiratory: Negative for cough, chest tightness, shortness of breath and wheezing.    Cardiovascular: Negative for chest pain and leg swelling.   Gastrointestinal: Negative for abdominal pain, diarrhea, nausea and vomiting.   Endocrine: Negative for cold intolerance, heat intolerance, polydipsia and polyphagia.   Musculoskeletal: Negative for arthralgias.   Skin: Negative  for rash.   Neurological: Negative for dizziness, tremors, syncope, numbness and headaches.   Psychiatric/Behavioral: Negative for dysphoric mood and suicidal ideas. The patient is nervous/anxious.        Prior to Visit Medications    Medication Sig Taking? Authorizing Provider   traZODone (DESYREL) 100 MG tablet Take 100 mg by mouth nightly Yes Historical Provider, MD   hydrOXYzine (ATARAX) 25 MG tablet Take 25 mg by mouth 4 times daily Yes Historical Provider, MD   ondansetron (ZOFRAN) 4 MG tablet Take 4 mg by mouth every 8 hours as needed Yes Historical Provider, MD   glucose monitoring kit (FREESTYLE) monitoring kit 1 kit by Does not apply route daily Yes Navia Lindahl C Shamell Suarez, DO   blood glucose test strips (ASCENSIA AUTODISC VI;ONE TOUCH ULTRA TEST VI) strip Use to test sugar once daily. Yes Silvano Bilis, DO   ONE TOUCH ULTRASOFT LANCETS MISC Use to test sugar once daily. Yes Silvano Bilis, DO   Blood Glucose Monitoring Suppl (ONE TOUCH ULTRA 2) w/Device KIT Use to test sugar once daily. Yes Silvano Bilis, DO        Allergies   Allergen Reactions   ??? Latex Itching, Rash and Other (See Comments)     Burning to the touch    ??? Latex Rash       Past Medical History:   Diagnosis Date   ??? Anxiety    ??? 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 Bipolar affective disorder ([place])    ??? Cervical fusion syndrome    ??? Chronic pain    ??? Depression    ??? Gestational diabetes mellitus    ??? Hepatitis A 07/14/2018   ??? Hepatitis C 08/02/2017   ??? 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   ??? History of heroin abuse ([place])    ??? HPV in female    ??? Miscarriage    ??? 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 Neck pain    ??? OCD (obsessive compulsive disorder)    ??? Paranoia ([place])    ??? Tobacco abuse        Past Surgical History:   Procedure Laterality Date   ??? CERVICAL SPINE SURGERY      C1-C2 fusion 2003   ??? CESAREAN SECTION     ??? ECTOPIC PREGNANCY SURGERY         Social History     Socioeconomic History   ??? Marital status: Divorced     Spouse  name: Not on file   ??? Number of children: 1   ??? Years of education: Not on file   ??? Highest education level: Not on file   Occupational History   ??? Not on file   Social Needs   ??? Financial resource strain: Not on file   ??? Food insecurity     Worry: Not on file     Inability: Not on file   ??? Transportation needs     Medical: Not on file     Non-medical: Not on file   Tobacco Use   ??? Smoking status: Former Smoker     Packs/day: 0.50     Years: 11.00     Pack years: 5.50     Types: Cigarettes   ??? Smokeless tobacco: Never Used   Substance and Sexual Activity   ??? Alcohol use: Yes     Comment: every 2 months   ??? Drug use: Yes     Types: Cocaine, Opiates , Marijuana     Comment: 07/13/18 heroin, cocaine, marijuana   ??? Sexual activity: Yes     Partners: Male   Lifestyle   ??? Physical activity     Days per week: Not on file     Minutes per session: Not on file   ??? Stress: Not on file   Relationships   ??? Social Product manager on phone: Not on file     Gets together: Not on file     Attends religious service: Not on file     Active member of club or organization: Not on file     Attends meetings of clubs or organizations: Not on file     Relationship status: Not on file   ??? Intimate partner violence     Fear of current or ex partner: Not on file     Emotionally abused: Not on file     Physically abused: Not on file     Forced sexual activity: Not on file   Other Topics Concern   ??? Not on file   Social History Narrative    ** Merged History Encounter **             Family History   Problem Relation Age of Onset   ??? Arthritis Other    ??? Asthma Other    ??? Cancer Other    ???  Diabetes Other    ??? Kidney Disease Other        Vitals:    08/16/19 0941   BP: 117/72   Temp: 97.3 ??F (36.3 ??C)   Weight: 150 lb 12.8 oz (68.4 kg)   Height: _0  (1.651 m)     Estimated body mass index is 25.09 kg/m?? as calculated from the following:    Height as of this encounter: _1  (1.651 m).    Weight as of this encounter: 150 lb 12.8 oz (68.4  kg).      Chemistry        Component Value Date/Time    NA 139 08/16/2019 1012    K 4.8 08/16/2019 1012    K 3.8 07/18/2018 0542    CL 101 08/16/2019 1012    CO2 29 08/16/2019 1012    BUN 15 08/16/2019 1012    CREATININE 0.7 08/16/2019 1012        Component Value Date/Time    CALCIUM 9.7 08/16/2019 1012    ALKPHOS 85 08/16/2019 1012    AST 63 (H) 08/16/2019 1012    ALT 150 (H) 08/16/2019 1012    BILITOT <0.2 08/16/2019 1012          Lab Results   Component Value Date    WBC 5.4 08/16/2019    HGB 13.6 08/16/2019    HCT 41.2 08/16/2019    MCV 89.3 08/16/2019    PLT 182 08/16/2019     Lab Results   Component Value Date    LABA1C 7.0 08/16/2019     Lab Results   Component Value Date    EAG 154.2 08/16/2019     Lab Results   Component Value Date    LABA1C 7.0 08/16/2019     No components found for: CHLPL  No results found for: TRIG  No results found for: HDL  No results found for: LDLCALC  No results found for: LABVLDL    Physical Exam  Vitals signs and nursing note reviewed.   Constitutional:       Appearance: She is well-developed.   HENT:      Head: Normocephalic and atraumatic.      Right Ear: Tympanic membrane, ear canal and external ear normal.      Left Ear: Tympanic membrane, ear canal and external ear normal.      Nose: Nose normal.      Mouth/Throat:      Mouth: Mucous membranes are moist.   Eyes:      Conjunctiva/sclera: Conjunctivae normal.      Pupils: Pupils are equal, round, and reactive to light.   Neck:      Musculoskeletal: Normal range of motion and neck supple. No neck rigidity or muscular tenderness.      Vascular: No carotid bruit.   Cardiovascular:      Rate and Rhythm: Normal rate and regular rhythm.      Heart sounds: Normal heart sounds.   Pulmonary:      Effort: Pulmonary effort is normal.      Breath sounds: Normal breath sounds. No wheezing.   Abdominal:      General: Bowel sounds are normal.      Palpations: Abdomen is soft.      Tenderness: There is no abdominal tenderness.   Skin:      General: Skin is warm and dry.   Neurological:      Mental Status: She is alert and oriented to person, place, and time.  Psychiatric:         Behavior: Behavior normal.         ASSESSMENT/PLAN:  1. Encounter to establish care  Care established  Medications reviewed  Questions answered  Labs obtained  RTC in three months for follow up.     2. Polysubstance abuse ([place])  Stable  Continue with medication  Keep appointments with specialist.   Answered questions  - CBC Auto Differential  - Comprehensive Metabolic Panel  - Hemoglobin A1C  - External Referral to Psychiatry    3. Acute midline low back pain without sciatica  Patient will use otc medication  She was educated on the medication and its use.  She will  Use ice, heat, and stretching.   She was shown stretching exercises.   She will push fluids and rest.   RTC if not improved.  - CBC Auto Differential  - Comprehensive Metabolic Panel  - Hemoglobin A1C  - External Referral to Psychiatry    4. 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   Answered questions  - CBC Auto Differential  - Comprehensive Metabolic Panel  - Hemoglobin A1C  - External Referral to Psychiatry    5. Type 2 diabetes mellitus without complication, without long-term current use of insulin ([place])  Patient will need to keep a log of his glucose readings and bring them to the office.   He needs to monitor his diet and exercise.   Attempt to lose weight.   Discussed proper eating habits.   Continue to take medication as prescribed.   Will obtain A1C at this visit.   Educated on signs and symptoms for immediate evaluation in the ER.   - CBC Auto Differential  - Comprehensive Metabolic Panel  - Hemoglobin A1C  - External Referral to Psychiatry    6. Obsessive-compulsive disorder, unspecified type  Stable  Continue with medication  Keep appointments with specialist.   Answered questions  Needs to make appointment as soon as possible  Too  many concerns for one visit   Needs to come back sooner  She is stable today and understands and agrees with the plan.   - CBC Auto Differential  - Comprehensive Metabolic Panel  - Hemoglobin A1C  - External Referral to Psychiatry      Return in about 4 weeks (around 09/13/2019).

## 2019-08-16 NOTE — Telephone Encounter (Signed)
Pt called because she is waiting for a rx for a Glucose meter, Lancets and Test Strips sent to [facility] [phone]. Her previous doctor sent her med list over. Please give pt a call [phone]

## 2019-08-16 NOTE — Patient Instructions (Signed)
Thank you for coming.   Please take you medications as prescribed.   Please continue to eat a balanced diet and exercise.  Labs were obtained and we will contact you with the results.   If you have questions please let me know via phone or email.

## 2019-08-17 LAB — HEMOGLOBIN A1C
Hemoglobin A1C: 7 %
eAG: 154.2 mg/dL

## 2019-08-17 NOTE — Telephone Encounter (Signed)
Kayla from [facility] is calling stating the one touch ultra is not covered by her insurance she wanted to know if ok to switch to free style instead please advise

## 2019-08-17 NOTE — Telephone Encounter (Signed)
Pt line busy.

## 2019-08-17 NOTE — Telephone Encounter (Signed)
Pt's line busy

## 2019-08-17 NOTE — Telephone Encounter (Signed)
Yes.  Thank you, Dr. Pricilla Holm

## 2019-08-17 NOTE — Telephone Encounter (Signed)
Pharmacy informed

## 2019-08-22 ENCOUNTER — Ambulatory Visit
Admit: 2019-08-22 | Discharge: 2019-08-22 | Payer: PRIVATE HEALTH INSURANCE | Attending: "Endocrinology | Primary: Family Medicine

## 2019-08-22 DIAGNOSIS — D497 Neoplasm of unspecified behavior of endocrine glands and other parts of nervous system: Secondary | ICD-10-CM

## 2019-08-22 NOTE — Progress Notes (Signed)
Patient ID:   [name] [name] is a 38 y.o. female    Chief Complaint:   [name] [name] is seen for initial evaluation of elevated pituitary tumor.     Subjective:   MRI in April 2015: showed 4 cm pituitary tumor. 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 There is no report to go by. [facility] endo notes reviewed. There is no follow up MRI completed. Initial work up in April 2015 was normal.   Used to follow with [facility]- discharged from practice.     Last pregnancy in 2015   Mentsrual cycles are irregular. No plans to have kids    She has headaches, bilateral galactorrhea, gaining weight, hot flashes,    Swelling of legs no change in shoe size or ring size   Denies purple stretch marks   Denies drugs   Not on antipsychotics, SSRI???s, estrogen supplements, antiemetics, Ca channel blockers, methyldopa, opioids    She is on Suboxone program   Type 2 DM as per pcp     The following portions of the patient's history were reviewed and updated as appropriate:        Family History   Problem Relation Age of Onset   ??? Arthritis Other    ??? Asthma Other    ??? Cancer Other    ??? Diabetes Other    ??? Kidney Disease Other          Social History     Socioeconomic History   ??? Marital status: Divorced     Spouse name: Not on file   ??? Number of children: 1   ??? Years of education: Not on file   ??? Highest education level: Not on file   Occupational History   ??? Not on file   Social Needs   ??? Financial resource strain: Not on file   ??? Food insecurity     Worry: Not on file     Inability: Not on file   ??? Transportation needs     Medical: Not on file     Non-medical: Not on file   Tobacco Use   ??? Smoking status: Former Smoker     Packs/day: 0.50     Years: 11.00     Pack years: 5.50     Types: Cigarettes   ??? Smokeless tobacco: Never Used   Substance and Sexual Activity   ??? Alcohol use: Yes     Comment: every 2 months   ??? Drug use: Yes     Types: Cocaine, Opiates , Marijuana     Comment: 07/13/18 heroin, cocaine,  marijuana   ??? Sexual activity: Yes     Partners: Male   Lifestyle   ??? Physical activity     Days per week: Not on file     Minutes per session: Not on file   ??? Stress: Not on file   Relationships   ??? Social Product manager on phone: Not on file     Gets together: Not on file     Attends religious service: Not on file     Active member of club or organization: Not on file     Attends meetings of clubs or organizations: Not on file     Relationship status: Not on file   ??? Intimate partner violence     Fear of current or ex partner: Not on file     Emotionally abused: Not on file     Physically abused:  Not on file     Forced sexual activity: Not on file   Other Topics Concern   ??? Not on file   Social History Narrative    ** Merged History Encounter **              Past Medical History:   Diagnosis Date   ??? Anxiety    ??? Benign tumor of pituitary gland ([place])     ?cancer or not   ??? Bipolar affective disorder ([place])    ??? Cervical fusion syndrome    ??? Chronic pain    ??? Depression    ??? Gestational diabetes mellitus    ??? Hepatitis A 07/14/2018   ??? Hepatitis C 08/02/2017   ??? 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   ??? History of heroin abuse ([place])    ??? HPV in female    ??? Miscarriage    ??? 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 Neck pain    ??? OCD (obsessive compulsive disorder)    ??? Paranoia ([place])    ??? Tobacco abuse          Past Surgical History:   Procedure Laterality Date   ??? CERVICAL SPINE SURGERY      C1-C2 fusion 2003   ??? CESAREAN SECTION     ??? ECTOPIC PREGNANCY SURGERY           Allergies   Allergen Reactions   ??? Latex Itching, Rash and Other (See Comments)     Burning to the touch    ??? Latex Rash   ??? Cephalexin Anaphylaxis         Current Outpatient Medications:   ???  Blood Glucose Monitoring Suppl (ACCU-CHEK AVIVA) DEVI, Accu-Chek Aviva Plus test strips  Take 1 strip twice a day by miscell. route as directed for 30 days.  Check blood sugar in the morning before breakfast and two hours after dinner each day., Disp: , Rfl:   ???   Alcohol Swabs PADS, Use once a day as directed    re: type 2 diabetes, Disp: , Rfl:   ???  Lancet Devices (LANCING DEVICE) MISC, Use once a day as directed    re: type 2 diabetes, Disp: , Rfl:   ???  acetaminophen (TYLENOL) 500 MG tablet, Take 500 mg by mouth every 6 hours as needed, Disp: , Rfl:   ???  buprenorphine-naloxone (SUBOXONE) 8-2 MG SUBL SL tablet, buprenorphine 8 mg-naloxone 2 mg sublingual tablet, Disp: , Rfl:   ???  cloNIDine (CATAPRES) 0.1 MG tablet, clonidine HCl 0.1 mg tablet, Disp: , Rfl:   ???  gabapentin (NEURONTIN) 300 MG capsule, Take 600 mg by mouth daily. , Disp: , Rfl:   ???  naloxone (NARCAN) 4 MG/0.1ML LIQD nasal spray, Narcan 4 mg/actuation nasal spray, Disp: , Rfl:   ???  traZODone (DESYREL) 100 MG tablet, Take 150 mg by mouth nightly , Disp: , Rfl:   ???  hydrOXYzine (ATARAX) 25 MG tablet, Take 25 mg by mouth 4 times daily, Disp: , Rfl:   ???  ondansetron (ZOFRAN) 4 MG tablet, Take 4 mg by mouth every 8 hours as needed, Disp: , Rfl:   ???  ONE TOUCH ULTRASOFT LANCETS MISC, Use to test sugar once daily., Disp: 100 each, Rfl: 3      Review of Systems:  Constitutional: Negative for fever, chills, and unexpected weight change.   HENT: Negative for congestion, ear pain, rhinorrhea,  sore throat and trouble swallowing.   Eyes: Negative  for photophobia, redness, itching.   Respiratory: Negative for cough, shortness of breath and sputum.   Cardiovascular: Negative for chest pain, palpitations and leg swelling.   Gastrointestinal: Negative for nausea, vomiting, abdominal pain, diarrhea, constipation.   Endocrine: Negative for cold intolerance, heat intolerance, polydipsia, polyphagia and polyuria.   Genitourinary: Negative for dysuria, urgency, frequency, hematuria and flank pain.   Musculoskeletal: Negative for myalgias, back pain, arthralgias and neck pain.   Skin/Nail: Negative for rash, itching. Normal nails.   Neurological: Negative for seizures, weakness, light-headedness, numbness and headaches.    Hematological/ Lymph nodes: Negative for adenopathy. Does not bruise/bleed easily.   Psychiatric/Behavioral: Negative for suicidal ideas, depression, anxiety, sleep disturbance and decreased concentration.          Objective:   Physical Exam:  BP 107/62 (Site: Right Upper Arm, Position: Sitting, Cuff Size: Medium Adult)    Pulse 67    Temp 97.9 ??F (36.6 ??C) (Temporal)    Ht 5' 5.5" (1.664 m)    Wt 157 lb 14.4 oz (71.6 kg)    LMP 12/22/2018    SpO2 97%    Breastfeeding No    BMI 25.88 kg/m??   Constitutional: Patient is oriented to person, place, and time. Patient appears well-developed and well-nourished.   HENT:    Head: Normocephalic and atraumatic.   Eyes: Conjunctivae and EOM are normal.     Neck: Normal range of motion. Thyroid normal. [place] normal in 2015  Cardiovascular: Normal rate, regular rhythm and normal heart sounds.    Pulmonary/Chest: Effort normal and breath sounds normal.   Abdominal: Soft. Bowel sounds are normal.   Musculoskeletal: Normal range of motion.   Neurological: Patient is alert and oriented to person, place, and time. Patient has slow normal reflexes. No fine tremors.   Skin: Skin is warm and dry.   Psychiatric: Patient has a normal mood and affect. Patient behavior is normal.     Lab Review:    Office Visit on 08/16/2019   Component Date Value Ref Range Status   ??? WBC 08/16/2019 5.4  4.0 - 11.0 K/uL Final   ??? RBC 08/16/2019 4.61  4.00 - 5.20 M/uL Final   ??? Hemoglobin 08/16/2019 13.6  12.0 - 16.0 g/dL Final   ??? Hematocrit 08/16/2019 41.2  36.0 - 48.0 % Final   ??? MCV 08/16/2019 89.3  80.0 - 100.0 fL Final   ??? MCH 08/16/2019 29.5  26.0 - 34.0 pg Final   ??? MCHC 08/16/2019 33.1  31.0 - 36.0 g/dL Final   ??? RDW 08/16/2019 13.4  12.4 - 15.4 % Final   ??? Platelets 08/16/2019 182  135 - 450 K/uL Final   ??? MPV 08/16/2019 9.3  5.0 - 10.5 fL Final   ??? Neutrophils % 08/16/2019 37.4  % Final   ??? Lymphocytes % 08/16/2019 51.5  % Final   ??? Monocytes % 08/16/2019 7.0  % Final   ??? Eosinophils % 08/16/2019  3.1  % Final   ??? Basophils % 08/16/2019 1.0  % Final   ??? Neutrophils Absolute 08/16/2019 2.0  1.7 - 7.7 K/uL Final   ??? Lymphocytes Absolute 08/16/2019 2.8  1.0 - 5.1 K/uL Final   ??? Monocytes Absolute 08/16/2019 0.4  0.0 - 1.3 K/uL Final   ??? Eosinophils Absolute 08/16/2019 0.2  0.0 - 0.6 K/uL Final   ??? Basophils Absolute 08/16/2019 0.1  0.0 - 0.2 K/uL Final   ??? Sodium 08/16/2019 139  136 - 145 mmol/L Final   ???  Potassium 08/16/2019 4.8  3.5 - 5.1 mmol/L Final   ??? Chloride 08/16/2019 101  99 - 110 mmol/L Final   ??? CO2 08/16/2019 29  21 - 32 mmol/L Final   ??? Anion Gap 08/16/2019 9  3 - 16 Final   ??? Glucose 08/16/2019 83  70 - 99 mg/dL Final   ??? BUN 08/16/2019 15  7 - 20 mg/dL Final   ??? CREATININE 08/16/2019 0.7  0.6 - 1.1 mg/dL Final   ??? GFR Non-African American 08/16/2019 >60  >60 Final   ??? GFR African American 08/16/2019 >60  >60 Final   ??? Calcium 08/16/2019 9.7  8.3 - 10.6 mg/dL Final   ??? Total Protein 08/16/2019 6.4  6.4 - 8.2 g/dL Final   ??? Alb 08/16/2019 4.1  3.4 - 5.0 g/dL Final   ??? Albumin/Globulin Ratio 08/16/2019 1.8  1.1 - 2.2 Final   ??? Total Bilirubin 08/16/2019 <0.2  0.0 - 1.0 mg/dL Final   ??? Alkaline Phosphatase 08/16/2019 85  40 - 129 U/L Final   ??? ALT 08/16/2019 150* 10 - 40 U/L Final   ??? AST 08/16/2019 63* 15 - 37 U/L Final   ??? Globulin 08/16/2019 2.3  g/dL Final   ??? Hemoglobin A1C 08/16/2019 7.0  See comment % Final   ??? eAG 08/16/2019 154.2  mg/dL Final           Assessment and Plan     Jeidy was seen today for consultation.    Diagnoses and all orders for this visit:    Pituitary tumor  -     Comprehensive Metabolic Panel; Future  -     TSH without Reflex; Future  -     T4, Free; Future  -     T3, Free; Future  -     Prolactin; Future  -     ACTH; Future  -     Cortisol AM, Total; Future  -     Miscellaneous Sendout 1; Future  -     Estrogens, Fractionated; Future  -     Insulin-Like Growth Factor; Future  -     Luteinizing Hormone; Future  -     Follicle Stimulating Hormone; Future  -     MRI  PITUITARY W WO CONTRAST; Future  -     [facility]FL - Vertell Limber, MD, Ophthalmology, [facility]        1: pituitary tumor   Recods reviewed from care every where     No MRI brain report available     Will get labs and MRI     Check for CCP, TSH, FT4, FT3, Prolactin, LH, FSH, estrogens, ACTH, AM cortisol     Refer to ophthalmologist for VF exam     RTC in 3 weeks to discuss results         Electronically signed by Devota Pace, MD on 08/22/2019 at 2:02 PM

## 2019-08-28 NOTE — Telephone Encounter (Signed)
Let the patient know that her medication is in.  She needs to contact her insurance to see what eye doctor accepts her insurance.  Dr. Pricilla Holm

## 2019-08-28 NOTE — Telephone Encounter (Signed)
Patient is calling stating the eye doctor that Dr.Bramlee referred her to doesn't accept her insurance any other recommendations? Also she states the endocrinologist only wants to handle the tumor issue and is telling her to follow up with pcp for the diabetes. Also she needs a refill on her hydroxyzine 25 mg take 4 times daily  Gabapentin 300 mg twice daily   [place]

## 2019-08-29 MED ORDER — GABAPENTIN 300 MG PO CAPS
300 MG | ORAL_CAPSULE | Freq: Every day | ORAL | 2 refills | Status: DC
Start: 2019-08-29 — End: 2021-06-04

## 2019-08-29 MED ORDER — HYDROXYZINE HCL 25 MG PO TABS
25 MG | ORAL_TABLET | Freq: Four times a day (QID) | ORAL | 0 refills | Status: DC
Start: 2019-08-29 — End: 2019-12-26

## 2019-08-29 NOTE — Telephone Encounter (Signed)
Called pt phone number twice, busy signal both times

## 2019-09-13 ENCOUNTER — Encounter: Payer: PRIVATE HEALTH INSURANCE | Attending: Family Medicine | Primary: Family Medicine

## 2019-09-17 ENCOUNTER — Encounter: Attending: "Endocrinology | Primary: Family Medicine

## 2019-09-21 ENCOUNTER — Ambulatory Visit: Payer: PRIVATE HEALTH INSURANCE | Primary: Family Medicine

## 2019-10-03 NOTE — Telephone Encounter (Signed)
-----   Message from Oswaldo Done sent at 10/02/2019  4:00 PM EST -----  Subject: Message to Provider    QUESTIONS  Information for Provider? PT [facility] [address] as of 12/01. Pt will follow up after discharge.   ---------------------------------------------------------------------------  --------------  CALL BACK INFO  What is the best way for the office to contact you? OK to leave message on   voicemail  Preferred Call Back Phone Number? [phone]  ---------------------------------------------------------------------------  --------------  SCRIPT ANSWERS  Relationship to Patient? Self

## 2019-10-08 ENCOUNTER — Encounter: Attending: "Endocrinology | Primary: Family Medicine

## 2019-12-11 ENCOUNTER — Telehealth

## 2019-12-11 ENCOUNTER — Ambulatory Visit: Payer: PRIVATE HEALTH INSURANCE | Primary: Family Medicine

## 2019-12-11 NOTE — Telephone Encounter (Signed)
Called central scheduling spoke with  Aldona Bar explained STAT order placed.

## 2019-12-11 NOTE — Telephone Encounter (Signed)
Placed.

## 2019-12-11 NOTE — Telephone Encounter (Signed)
Brandy from [place] scheduling called to have a STAT Lab order placed in epic for BUN Creatinine

## 2019-12-12 ENCOUNTER — Ambulatory Visit: Payer: PRIVATE HEALTH INSURANCE | Primary: Family Medicine

## 2019-12-12 ENCOUNTER — Inpatient Hospital Stay: Admit: 2019-12-12 | Payer: PRIVATE HEALTH INSURANCE | Primary: Family Medicine

## 2019-12-12 ENCOUNTER — Inpatient Hospital Stay: Payer: PRIVATE HEALTH INSURANCE | Primary: Family Medicine

## 2019-12-12 DIAGNOSIS — D497 Neoplasm of unspecified behavior of endocrine glands and other parts of nervous system: Secondary | ICD-10-CM

## 2019-12-12 LAB — CORTISOL AM, TOTAL: Cortisol - AM: 9.3 ug/dL (ref 4.3–22.4)

## 2019-12-12 LAB — COMPREHENSIVE METABOLIC PANEL
ALT: 111 U/L — ABNORMAL HIGH (ref 10–40)
AST: 81 U/L — ABNORMAL HIGH (ref 15–37)
Albumin/Globulin Ratio: 1.4 (ref 1.1–2.2)
Albumin: 4.5 g/dL (ref 3.4–5.0)
Alkaline Phosphatase: 92 U/L (ref 40–129)
Anion Gap: 12 (ref 3–16)
BUN: 8 mg/dL (ref 7–20)
CO2: 28 mmol/L (ref 21–32)
Calcium: 10 mg/dL (ref 8.3–10.6)
Chloride: 99 mmol/L (ref 99–110)
Creatinine: 0.6 mg/dL (ref 0.6–1.1)
GFR African American: >60 (ref >60)
GFR Non-African American: >60 (ref >60)
Globulin: 3.2 g/dL
Glucose: 116 mg/dL — ABNORMAL HIGH (ref 70–99)
Potassium: 4.4 mmol/L (ref 3.5–5.1)
Total Bilirubin: <0.2 mg/dL (ref 0.0–1.0)
Total Protein: 7.7 g/dL (ref 6.4–8.2)

## 2019-12-12 LAB — T4, FREE: T4 Free: 1.1 ng/dL (ref 0.9–1.8)

## 2019-12-12 LAB — FOLLICLE STIMULATING HORMONE: FSH: 3 m[IU]/mL

## 2019-12-12 LAB — TSH: TSH: 0.84 u[IU]/mL (ref 0.27–4.20)

## 2019-12-12 LAB — LUTEINIZING HORMONE: LH: 1.9 m[IU]/mL

## 2019-12-12 LAB — PROLACTIN: Prolactin: 6.5 ng/mL

## 2019-12-12 LAB — T3, FREE: T3, Free: 3.7 pg/mL (ref 2.3–4.2)

## 2019-12-12 MED ORDER — NORMAL SALINE FLUSH 0.9 % IV SOLN
0.9 | INTRAVENOUS | Status: AC
Start: 2019-12-12 — End: 2019-12-12

## 2019-12-12 MED ORDER — NORMAL SALINE FLUSH 0.9 % IV SOLN
0.9 % | INTRAVENOUS | Status: DC | PRN
Start: 2019-12-12 — End: 2019-12-13
  Administered 2019-12-12: 15:00:00 10 mL via INTRAVENOUS

## 2019-12-12 MED ORDER — GADOBENATE DIMEGLUMINE 529 MG/ML IV SOLN
529 MG/ML | Freq: Once | INTRAVENOUS | Status: AC | PRN
Start: 2019-12-12 — End: 2019-12-12
  Administered 2019-12-12: 15:00:00 13 mL via INTRAVENOUS

## 2019-12-12 MED FILL — SODIUM CHLORIDE FLUSH 0.9 % IV SOLN: 0.9 % | INTRAVENOUS | Qty: 10

## 2019-12-12 NOTE — Telephone Encounter (Signed)
Follow up scheduled to discuss results.

## 2019-12-12 NOTE — Telephone Encounter (Signed)
PT has completed her MRI and Labs and is requesting a cal back to give results when they come in

## 2019-12-14 LAB — INSULIN-LIKE GROWTH FACTOR
IGF-1 (INSULIN-LIKE GROWTH I): 223 ng/mL (ref 79–276)
Insulin-Like GF-1 Z-Score: 1.2

## 2019-12-14 LAB — MISC ARUP 1

## 2019-12-17 LAB — ACTH: ACTH: 21 pg/mL (ref 6–58)

## 2019-12-21 ENCOUNTER — Encounter

## 2019-12-21 LAB — ESTROGENS, FRACTIONATED
Estradiol: 6.4 pg/mL
Estrogen Total: 17.2 pg/mL
Estrone: 10.8 pg/mL

## 2019-12-22 LAB — COMPREHENSIVE METABOLIC PANEL
ALT: 92 U/L — ABNORMAL HIGH (ref 10–40)
AST: 62 U/L — ABNORMAL HIGH (ref 15–37)
Albumin/Globulin Ratio: 1.8 (ref 1.1–2.2)
Albumin: 4.7 g/dL (ref 3.4–5.0)
Alkaline Phosphatase: 75 U/L (ref 40–129)
Anion Gap: 11 (ref 3–16)
BUN: 11 mg/dL (ref 7–20)
CO2: 27 mmol/L (ref 21–32)
Calcium: 9.9 mg/dL (ref 8.3–10.6)
Chloride: 98 mmol/L — ABNORMAL LOW (ref 99–110)
Creatinine: 0.6 mg/dL (ref 0.6–1.1)
GFR African American: >60 (ref >60)
GFR Non-African American: >60 (ref >60)
Globulin: 2.6 g/dL
Glucose: 168 mg/dL — ABNORMAL HIGH (ref 70–99)
Potassium: 4.2 mmol/L (ref 3.5–5.1)
Sodium: 136 mmol/L (ref 136–145)
Total Bilirubin: <0.2 mg/dL (ref 0.0–1.0)
Total Protein: 7.3 g/dL (ref 6.4–8.2)

## 2019-12-24 LAB — MISC ARUP 1

## 2019-12-26 ENCOUNTER — Ambulatory Visit
Admit: 2019-12-26 | Discharge: 2019-12-26 | Payer: PRIVATE HEALTH INSURANCE | Attending: "Endocrinology | Primary: Family Medicine

## 2019-12-26 DIAGNOSIS — D497 Neoplasm of unspecified behavior of endocrine glands and other parts of nervous system: Secondary | ICD-10-CM

## 2019-12-28 ENCOUNTER — Ambulatory Visit
Admit: 2019-12-28 | Discharge: 2019-12-28 | Payer: PRIVATE HEALTH INSURANCE | Attending: Family Medicine | Primary: Family Medicine

## 2019-12-28 DIAGNOSIS — F1110 Opioid abuse, uncomplicated: Secondary | ICD-10-CM

## 2019-12-28 MED ORDER — SERTRALINE HCL 50 MG PO TABS
50 MG | ORAL_TABLET | Freq: Every day | ORAL | 0 refills | Status: DC
Start: 2019-12-28 — End: 2021-04-28

## 2019-12-28 NOTE — Progress Notes (Signed)
12/28/2019     [name] [name] (DOB:  [birth year]) is a 39 y.o. female, here for evaluation of the following medical concerns:  Patient presented to the clinic to follow up from Oct. 15, 20.   She is currently homeless and is using heroine.  She is upset in the room and is skipping from one topic to the next.     HPI     1.  Heroine abuse- difficult, uses multiple times a day, doesn't want to stop the medication? Is homeless and living in her car but has an opportunity to living a shelter but refuses do do so? Patient has upcoming appointment with [facility] next week.      2.  Liver enzymes- elevated liver enzymes, hep c infection, needs to follow up with GI?    3.  Migraine headaches-patient continues to have headaches at times and uses otc medication.  NO headache today.     Today, denied chest pain, sob, n, , v, or diarrhea.         Review of Systems   Constitutional: Negative for activity change, fatigue, fever and unexpected weight change.   HENT: Negative for congestion, ear pain, rhinorrhea and sore throat.    Respiratory: Negative for shortness of breath.    Cardiovascular: Negative for chest pain.   Gastrointestinal: Negative for abdominal pain, diarrhea, nausea and vomiting.   Psychiatric/Behavioral: Negative for dysphoric mood and suicidal ideas. The patient is nervous/anxious.        Prior to Visit Medications    Medication Sig Taking? Authorizing Provider   sertraline (ZOLOFT) 50 MG tablet Take 1 tablet by mouth daily Yes Judson Tsan C Reyah Streeter, DO   gabapentin (NEURONTIN) 300 MG capsule Take 2 capsules by mouth daily for 30 days.  Silvano Bilis, DO   Blood Glucose Monitoring Suppl (ACCU-CHEK AVIVA) DEVI Accu-Chek Aviva Plus test strips   Take 1 strip twice a day by miscell. route as directed for 30 days.   Check blood sugar in the morning before breakfast and two hours after dinner each day.  Historical Provider, MD   Alcohol Swabs PADS Use once a day as directed    re: type 2 diabetes  Historical Provider,  MD   Lancet Devices (LANCING DEVICE) MISC Use once a day as directed    re: type 2 diabetes  Historical Provider, MD   acetaminophen (TYLENOL) 500 MG tablet Take 500 mg by mouth every 6 hours as needed  Historical Provider, MD   ONE TOUCH ULTRASOFT LANCETS MISC Use to test sugar once daily.  Silvano Bilis, DO        Social History     Tobacco Use   ??? Smoking status: Former Smoker     Packs/day: 0.50     Years: 11.00     Pack years: 5.50     Types: Cigarettes   ??? Smokeless tobacco: Never Used   Substance Use Topics   ??? Alcohol use: Yes     Comment: every 2 months        Vitals:    12/28/19 1138   BP: 122/74   Temp: 97.7 ??F (36.5 ??C)   Weight: 150 lb (68 kg)     Estimated body mass index is 24.96 kg/m?? as calculated from the following:    Height as of 12/26/19: 5\' 5"  (1.651 m).    Weight as of this encounter: 150 lb (68 kg).    Physical Exam  Vitals signs and nursing note reviewed.  Constitutional:       Appearance: She is well-developed.   HENT:      Head: Normocephalic and atraumatic.      Right Ear: External ear normal.      Left Ear: External ear normal.   Cardiovascular:      Rate and Rhythm: Normal rate and regular rhythm.      Heart sounds: Normal heart sounds. No murmur.   Pulmonary:      Effort: Pulmonary effort is normal.      Breath sounds: Normal breath sounds. No wheezing.   Abdominal:      General: Bowel sounds are normal.      Palpations: Abdomen is soft.      Tenderness: There is no abdominal tenderness.   Skin:     General: Skin is warm and dry.   Neurological:      Mental Status: She is alert and oriented to person, place, and time.   Psychiatric:         Behavior: Behavior normal.      Comments: See HPI         ASSESSMENT/PLAN:  1. Heroin abuse ([place])  This is major problem at this time  Unable to really move beyond this  Patient very emotional that ranges from crying to being angry  She has upcoming appointment with [facility] appointment.   Attempted to get the patient to contact help at this time  She  has sponsor and case worker. Will touch base today.  Feels safe at this time and refuses to go into a shelter  Able to make her own decision at this time.     2. Anxiety  Labs obtained  Medication provided  Discussed side effects of medication  Discussed signs and symptoms for immediate evaluation in ER.  Answered questions.    3. Hepatitis C virus infection without hepatic coma, unspecified chronicity  Stable  Continue with medication  Keep appointments with specialist.   Answered questions  - [place] - Judson Roch, DO, Gastroenterology, [facility]    4. Elevated LFTs  Referral given  Educated on the importance of having this done.   Answered questions  *spent over 25 minutes educating, treating, and evaluating the patient.     Return in about 4 weeks (around 01/25/2020).

## 2020-01-02 NOTE — Patient Instructions (Signed)
Thank you for coming.   Please take you medications as prescribed.   Please continue to eat a balanced diet and exercise.  Labs were obtained and we will contact you with the results.   If you have questions please let me know via phone or email.

## 2020-01-11 ENCOUNTER — Encounter: Payer: PRIVATE HEALTH INSURANCE | Attending: Family Medicine | Primary: Family Medicine

## 2020-12-24 ENCOUNTER — Encounter: Attending: "Endocrinology | Primary: Family Medicine

## 2021-04-27 ENCOUNTER — Inpatient Hospital Stay: Admit: 2021-04-27 | Discharge: 2021-04-27 | Discharge status: Against Medical Advice | Payer: PRIVATE HEALTH INSURANCE

## 2021-04-27 DIAGNOSIS — N939 Abnormal uterine and vaginal bleeding, unspecified: Secondary | ICD-10-CM

## 2021-04-27 LAB — CBC WITH AUTO DIFFERENTIAL
Basophils %: 0.6 %
Basophils Absolute: 0 10*3/uL (ref 0.0–0.2)
Eosinophils %: 0.9 %
Eosinophils Absolute: 0 10*3/uL (ref 0.0–0.6)
Hematocrit: 37.5 % (ref 36.0–48.0)
Hemoglobin: 12.7 g/dL (ref 12.0–16.0)
Lymphocytes %: 40.7 %
Lymphocytes Absolute: 2.2 10*3/uL (ref 1.0–5.1)
MCH: 30.9 pg (ref 26.0–34.0)
MCHC: 33.8 g/dL (ref 31.0–36.0)
MCV: 91.3 fL (ref 80.0–100.0)
MPV: 9.5 fL (ref 5.0–10.5)
Monocytes %: 5 %
Monocytes Absolute: 0.3 10*3/uL (ref 0.0–1.3)
Neutrophils %: 52.8 %
Neutrophils Absolute: 2.9 10*3/uL (ref 1.7–7.7)
Platelets: 135 10*3/uL (ref 135–450)
RBC: 4.11 M/uL (ref 4.00–5.20)
RDW: 13 % (ref 12.4–15.4)
WBC: 5.5 10*3/uL (ref 4.0–11.0)

## 2021-04-27 LAB — TYPE AND SCREEN
ABO/Rh: B NEG
Antibody Screen: NEGATIVE

## 2021-04-27 LAB — BASIC METABOLIC PANEL W/ REFLEX TO MG FOR LOW K
Anion Gap: 10 (ref 3–16)
BUN: 11 mg/dL (ref 7–20)
CO2: 23 mmol/L (ref 21–32)
Calcium: 8.9 mg/dL (ref 8.3–10.6)
Chloride: 103 mmol/L (ref 99–110)
Creatinine: 0.5 mg/dL — ABNORMAL LOW (ref 0.6–1.1)
GFR African American: >60 (ref >60)
GFR Non-African American: >60 (ref >60)
Glucose: 117 mg/dL — ABNORMAL HIGH (ref 70–99)
Potassium reflex Magnesium: 4 mmol/L (ref 3.5–5.1)
Sodium: 136 mmol/L (ref 136–145)

## 2021-04-27 LAB — HCG, SERUM, QUALITATIVE: hCG Qual: NEGATIVE

## 2021-04-28 ENCOUNTER — Ambulatory Visit
Admit: 2021-04-28 | Discharge: 2021-04-28 | Payer: PRIVATE HEALTH INSURANCE | Attending: Obstetrics & Gynecology | Primary: Family Medicine

## 2021-04-28 ENCOUNTER — Ambulatory Visit
Admit: 2021-04-28 | Discharge: 2021-04-28 | Payer: PRIVATE HEALTH INSURANCE | Attending: Family Medicine | Primary: Family Medicine

## 2021-04-28 DIAGNOSIS — Z1231 Encounter for screening mammogram for malignant neoplasm of breast: Secondary | ICD-10-CM

## 2021-04-28 DIAGNOSIS — N939 Abnormal uterine and vaginal bleeding, unspecified: Secondary | ICD-10-CM

## 2021-04-28 LAB — POCT GLYCOSYLATED HEMOGLOBIN (HGB A1C): Hemoglobin A1C: 5.8 %

## 2021-04-28 MED ORDER — TRANEXAMIC ACID 650 MG PO TABS
650 | ORAL_TABLET | Freq: Three times a day (TID) | ORAL | 1 refills | Status: DC
Start: 2021-04-28 — End: 2021-06-16

## 2021-04-28 NOTE — Progress Notes (Signed)
04/28/2021     [name] [name] (DOB:  1980/11/04) is a 40 y.o. female, here for evaluation of the following medical concerns:    HPI     Today, the patient is following up on chronic medical conditions.  Overall, she is feeling well.     Abnormal uterine bleeding- will refer to GYN    DM II - A1C 5.8 not on medication at this time.     opioid addiction- toaking suboxone.     Today, denied chest pain, sob,n ,v or diarrhea.       Review of Systems   Constitutional: Negative for activity change, fatigue, fever and unexpected weight change.   HENT: Negative for congestion, ear pain, rhinorrhea, sinus pressure and sore throat.    Respiratory: Negative for cough and shortness of breath.    Cardiovascular: Negative for chest pain and leg swelling.   Gastrointestinal: Negative for abdominal pain, diarrhea, nausea and vomiting.   Endocrine: Negative for cold intolerance, heat intolerance, polydipsia and polyphagia.   Musculoskeletal: Positive for arthralgias.   Skin: Negative for rash.   Neurological: Negative for dizziness, numbness and headaches.   Psychiatric/Behavioral: Negative for dysphoric mood. The patient is not nervous/anxious.        Prior to Visit Medications    Medication Sig Taking? Authorizing Provider   Blood Glucose Monitoring Suppl (ACCU-CHEK AVIVA) DEVI Accu-Chek Aviva Plus test strips   Take 1 strip twice a day by miscell. route as directed for 30 days.   Check blood sugar in the morning before breakfast and two hours after dinner each day. Yes Historical Provider, MD   Alcohol Swabs PADS Use once a day as directed    re: type 2 diabetes Yes Historical Provider, MD   Lancet Devices (LANCING DEVICE) MISC Use once a day as directed    re: type 2 diabetes Yes Historical Provider, MD   acetaminophen (TYLENOL) 500 MG tablet Take 500 mg by mouth every 6 hours as needed Yes Historical Provider, MD   ONE TOUCH ULTRASOFT LANCETS MISC Use to test sugar once daily. Yes Silvano Bilis, DO   tranexamic acid  (LYSTEDA) 650 MG TABS tablet Take 2 tablets by mouth 3 times daily  Erven Colla, MD   gabapentin (NEURONTIN) 300 MG capsule Take 2 capsules by mouth daily for 30 days.  Silvano Bilis, DO        Social History     Tobacco Use   ??? Smoking status: Former Smoker     Packs/day: 0.50     Years: 11.00     Pack years: 5.50     Types: Cigarettes   ??? Smokeless tobacco: Never Used   Substance Use Topics   ??? Alcohol use: Yes     Comment: every 2 months        Vitals:    04/28/21 1109   BP: 110/62   Pulse: 59   SpO2: 98%   Weight: 134 lb (60.8 kg)     Estimated body mass index is 22.3 kg/m?? as calculated from the following:    Height as of 12/26/19: 5\' 5"  (1.651 m).    Weight as of this encounter: 134 lb (60.8 kg).    Physical Exam  Constitutional:       Appearance: She is well-developed.   HENT:      Head: Normocephalic and atraumatic.      Right Ear: Tympanic membrane, ear canal and external ear normal.      Left  Ear: Ear canal and external ear normal.      Nose: Nose normal.   Eyes:      Conjunctiva/sclera: Conjunctivae normal.      Pupils: Pupils are equal, round, and reactive to light.   Cardiovascular:      Rate and Rhythm: Normal rate and regular rhythm.      Heart sounds: Normal heart sounds.   Pulmonary:      Effort: Pulmonary effort is normal.      Breath sounds: Normal breath sounds. No wheezing.   Abdominal:      General: Bowel sounds are normal.      Palpations: Abdomen is soft.      Tenderness: There is no abdominal tenderness.   Musculoskeletal:         General: Normal range of motion.   Skin:     General: Skin is warm and dry.   Neurological:      Mental Status: She is alert and oriented to person, place, and time.   Psychiatric:         Behavior: Behavior normal.         Judgment: Judgment normal.         ASSESSMENT/PLAN:  1. Abnormal uterine bleeding  Stable  Continue with medication  Keep appointments with specialist.   Answered questions  - POCT glycosylated hemoglobin (Hb F6O)  - [place] - Alvie Heidelberg,  MD, Gynecology, [facility]    2. Mild dysplasia of cervix (CIN I)  Stable  Continue with medication  Keep appointments with specialist.   Answered questions   POCT glycosylated hemoglobin (Hb Z3Y)  - [place] - Alvie Heidelberg, MD, Gynecology, [facility]    3. Polysubstance (excluding opioids) dependence ([place])  Stable  Continue with medication  Keep appointments with specialist.   Answered questions  - POCT glycosylated hemoglobin (Hb Q6V)  - [place] - Alvie Heidelberg, MD, Gynecology, [facility]    4. Type 2 diabetes mellitus without complication, without long-term current use of insulin ([place])  Patient will need to keep a log of his glucose readings and bring them to the office.   He needs to monitor his diet and exercise.   Attempt to lose weight.   Discussed proper eating habits.   Continue to take medication as prescribed.   Will obtain A1C at this visit.   Educated on signs and symptoms for immediate evaluation in the ER.   - POCT glycosylated hemoglobin (Hb H8I)  - [place] - Era Bumpers, Herbie [place], MD, Gynecology, [facility]      Return in about 3 months (around 07/29/2021).

## 2021-04-28 NOTE — Progress Notes (Signed)
Pt is here as a new pt from Dr Pricilla Holm.  She notes heavy menses for the past 4 cycles lasting 7 days and passing large clots.  Numerous pad changes each day.  Seen in the ER yesterday and hb 12.7.  No recent mammogram or pap.  She had a conization at [facility] 2 years ago but didn't follow up.  She is currently having heavy bleeding and declines exam today.    Assess:  Menorrhagia  Plan:  Mammogram, pelvic US.  rx lysteda.  F/u office 1-2 weeks.  20 min  >50% of time was spent discussing findings, management, and treatment options.

## 2021-04-29 ENCOUNTER — Inpatient Hospital Stay: Admit: 2021-04-29 | Payer: PRIVATE HEALTH INSURANCE | Primary: Family Medicine

## 2021-04-29 ENCOUNTER — Ambulatory Visit: Payer: PRIVATE HEALTH INSURANCE | Primary: Family Medicine

## 2021-04-29 ENCOUNTER — Encounter

## 2021-04-29 DIAGNOSIS — N939 Abnormal uterine and vaginal bleeding, unspecified: Secondary | ICD-10-CM

## 2021-05-05 ENCOUNTER — Ambulatory Visit
Admit: 2021-05-05 | Discharge: 2021-05-05 | Payer: PRIVATE HEALTH INSURANCE | Attending: Obstetrics & Gynecology | Primary: Family Medicine

## 2021-05-05 DIAGNOSIS — Z01419 Encounter for gynecological examination (general) (routine) without abnormal findings: Secondary | ICD-10-CM

## 2021-05-05 NOTE — Progress Notes (Signed)
Subjective:      Patient ID: [name] [name] is a 40 y.o. female.    HPI  pts here for annual gyn exam.  No longer on her period.  Picking up the Lysteda.  Wants std testing but denies vag d/c.    Review of Systems Pertinent review of systems items discussed above.  All others systems items not discussed above were negative.      Objective:   Physical Exam  Constitutional:       Appearance: She is well-developed.   HENT:      Head: Normocephalic and atraumatic.   Neck:      Thyroid: No thyromegaly.      Trachea: No tracheal deviation.   Cardiovascular:      Rate and Rhythm: Normal rate and regular rhythm.      Heart sounds: Normal heart sounds. No murmur heard.      Pulmonary:      Effort: Pulmonary effort is normal. No respiratory distress.      Breath sounds: Normal breath sounds. No wheezing or rales.   Chest:   Breasts:      Right: No mass, nipple discharge or skin change.      Left: No mass, nipple discharge or skin change.       Abdominal:      General: There is no distension.      Palpations: Abdomen is soft. There is no mass.      Tenderness: There is no abdominal tenderness. There is no rebound.   Genitourinary:     Labia:         Right: No lesion.         Left: No lesion.       Vagina: Normal. No foreign body. No vaginal discharge.      Cervix: No cervical motion tenderness, discharge or friability.      Uterus: Not deviated, not enlarged, not fixed and not tender.       Adnexa:         Right: No mass or tenderness.          Left: No mass or tenderness.        Rectum: Normal. No external hemorrhoid.      Comments: Pap performed.    Musculoskeletal:         General: Normal range of motion.   Lymphadenopathy:      Cervical: No cervical adenopathy.   Neurological:      Mental Status: She is alert and oriented to person, place, and time.       Affirm, dna    Assessment:   Normal gyn exam     Plan:   Std testing.  Call with results.  F/u 3 months to see how she's doing on Lysteda.       Erven Colla,  MD

## 2021-05-06 LAB — C.TRACHOMATIS N.GONORRHOEAE DNA
C. trachomatis DNA: NEGATIVE
N. gonorrhoeae DNA: NEGATIVE

## 2021-05-06 LAB — VAGINAL PATHOGENS PROBE *A
Candida Species, DNA Probe: NEGATIVE
Gardnerella Vaginalis, DNA Probe: POSITIVE — AB
Trichomonas Vaginalis DNA: NEGATIVE

## 2021-05-06 MED ORDER — METRONIDAZOLE 500 MG PO TABS
500 MG | ORAL_TABLET | Freq: Two times a day (BID) | ORAL | 0 refills | Status: AC
Start: 2021-05-06 — End: 2021-05-13

## 2021-05-06 NOTE — Telephone Encounter (Signed)
Pt + BV  Notified and rx sent to pharm

## 2021-05-08 ENCOUNTER — Ambulatory Visit: Payer: PRIVATE HEALTH INSURANCE | Primary: Family Medicine

## 2021-05-15 NOTE — Telephone Encounter (Signed)
Pt called to check PA status of lysteda, I can not get it to go thru as [facility] will not approve with out a dx of uterine fibroids or failed provera or depo provera.

## 2021-05-15 NOTE — Telephone Encounter (Signed)
Contact pt and let her know the Marshell Levan is not approved.  Offer Provera for her to try to regulate/lighten her periods or offer hyst d and c

## 2021-05-15 NOTE — Telephone Encounter (Signed)
Patient signed record release so [facility] can share information with Dr. Pricilla Holm.

## 2021-05-15 NOTE — Telephone Encounter (Signed)
Spoke w pt, sched pre op for 05/18/21

## 2021-05-19 ENCOUNTER — Ambulatory Visit
Admit: 2021-05-19 | Discharge: 2021-05-19 | Payer: PRIVATE HEALTH INSURANCE | Attending: Obstetrics & Gynecology | Primary: Family Medicine

## 2021-05-19 ENCOUNTER — Ambulatory Visit
Admit: 2021-05-19 | Discharge: 2021-05-19 | Payer: PRIVATE HEALTH INSURANCE | Attending: Family Medicine | Primary: Family Medicine

## 2021-05-19 DIAGNOSIS — B029 Zoster without complications: Secondary | ICD-10-CM

## 2021-05-19 DIAGNOSIS — N939 Abnormal uterine and vaginal bleeding, unspecified: Secondary | ICD-10-CM

## 2021-05-19 MED ORDER — VALACYCLOVIR HCL 1 G PO TABS
1 g | ORAL_TABLET | Freq: Three times a day (TID) | ORAL | 0 refills | Status: AC
Start: 2021-05-19 — End: 2021-05-26

## 2021-05-19 MED ORDER — MEDROXYPROGESTERONE ACETATE 10 MG PO TABS
10 MG | ORAL_TABLET | Freq: Every day | ORAL | 11 refills | Status: AC
Start: 2021-05-19 — End: 2023-01-18

## 2021-05-19 NOTE — Progress Notes (Signed)
05/19/2021     [name] [name] (DOB:  [birth year]) is a 40 y.o. female, here for evaluation of the following medical concerns:    HPI    Shingles- stated she has this each month, right hip, itch, will go along dermotome, seems to have this with menstrual cycle, will follow up with Derm.     Pituriatry glan- endo will follow up this year,  feels well overall.     Neurophthy- hands,  right foot, has hx of DM II, still has numbness and tingling, perhaps CT in hands.     Hep c- has been reactive in the past, will order this again.     Today, chest pain, sob,n, v or diarrhea.     Review of Systems   Constitutional:  Negative for activity change, fatigue, fever and unexpected weight change.   HENT:  Negative for congestion, ear pain, mouth sores, rhinorrhea, sinus pressure and sore throat.    Eyes:  Negative for visual disturbance.   Respiratory:  Negative for cough, chest tightness, shortness of breath and wheezing.    Cardiovascular:  Negative for chest pain and leg swelling.   Gastrointestinal:  Negative for abdominal pain, diarrhea, nausea and vomiting.   Endocrine: Negative for polyphagia.   Musculoskeletal:  Positive for arthralgias.   Skin:  Positive for color change and rash.   Neurological:  Negative for dizziness, numbness and headaches.   Psychiatric/Behavioral:  Negative for dysphoric mood. The patient is not nervous/anxious.      Prior to Visit Medications    Medication Sig Taking? Authorizing Provider   valACYclovir (VALTREX) 1 g tablet Take 1 tablet by mouth in the morning and 1 tablet at noon and 1 tablet before bedtime. Do all this for 7 days. Yes Silvano Bilis, DO   tranexamic acid (LYSTEDA) 650 MG TABS tablet Take 2 tablets by mouth 3 times daily Yes Erven Colla, MD   Blood Glucose Monitoring Suppl (ACCU-CHEK AVIVA) DEVI Accu-Chek Aviva Plus test strips   Take 1 strip twice a day by miscell. route as directed for 30 days.   Check blood sugar in the morning before breakfast and two hours  after dinner each day. Yes Historical Provider, MD   Alcohol Swabs PADS Use once a day as directed    re: type 2 diabetes Yes Historical Provider, MD   Lancet Devices (LANCING DEVICE) MISC Use once a day as directed    re: type 2 diabetes Yes Historical Provider, MD   acetaminophen (TYLENOL) 500 MG tablet Take 500 mg by mouth every 6 hours as needed Yes Historical Provider, MD   ONE TOUCH ULTRASOFT LANCETS MISC Use to test sugar once daily. Yes Silvano Bilis, DO   medroxyPROGESTERone (PROVERA) 10 MG tablet Take 1 tablet by mouth in the morning. From the 10th to the 20th of each month.  Erven Colla, MD   gabapentin (NEURONTIN) 300 MG capsule Take 2 capsules by mouth daily for 30 days.  Silvano Bilis, DO        Social History     Tobacco Use    Smoking status: Former     Packs/day: 0.50     Years: 11.00     Pack years: 5.50     Types: Cigarettes    Smokeless tobacco: Never   Substance Use Topics    Alcohol use: Yes     Comment: every 2 months        Vitals:    05/19/21 1014  BP: 100/68   Weight: 131 lb (59.4 kg)   Height: 5\' 5"  (1.651 m)     Estimated body mass index is 21.8 kg/m?? as calculated from the following:    Height as of this encounter: 5\' 5"  (1.651 m).    Weight as of this encounter: 131 lb (59.4 kg).    Physical Exam  Vitals and nursing note reviewed.   Constitutional:       Appearance: She is well-developed.   HENT:      Head: Normocephalic and atraumatic.      Right Ear: External ear normal.      Left Ear: External ear normal.      Nose: Nose normal.   Cardiovascular:      Rate and Rhythm: Normal rate and regular rhythm.      Heart sounds: Normal heart sounds.   Pulmonary:      Effort: Pulmonary effort is normal.      Breath sounds: Normal breath sounds. No wheezing.   Abdominal:      General: Bowel sounds are normal.      Palpations: Abdomen is soft.      Tenderness: There is no abdominal tenderness.   Musculoskeletal:         General: Tenderness present. Normal range of motion.   Skin:      General: Skin is warm and dry.      Findings: Erythema and rash present.   Neurological:      Mental Status: She is alert and oriented to person, place, and time.   Psychiatric:         Behavior: Behavior normal.       ASSESSMENT/PLAN:  1. Herpes zoster without complication  Take medication as prescribed.   Push fluids and rest  Discussed conservative treatment  Discussed signs and symptoms for immediate evaluation in the ER  RTC if no improved.   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   Answered questions   - [facility] - Darlina Sicilian, MD, Dermatology, [place]  - EMG; Future    3. Pituitary mass ([place])  Stable  Continue with medication  Keep appointments with specialist.   Answered questions   - [facility] - Darlina Sicilian, MD, Dermatology, [place]  - EMG; Future    4. Neuropathy  Referred for procedure  Educated on the importance of having this done.   Answered questions   - [facility] - Darlina Sicilian, MD, Dermatology, [place]  - EMG; Future    5. Hepatitis C virus infection without hepatic coma, unspecified chronicity  Need testing to confirm    Return in about 3 months (around 08/19/2021).

## 2021-05-19 NOTE — Progress Notes (Signed)
Pt notes having a period each month lasting 7days, then no bleeding for 3 days, then 3 more days of bleeding.  She is interested in medicine to help regulate her periods.  She might be interested in a hyst d and c.  I briefly discussed the procedure.  I also discussed taking Provera for 10 days each month to help regulate her menses.  Rx Provera.  All questions answered.  20 min  >50% of time was spent discussing findings, management, and treatment options.

## 2021-06-02 NOTE — Telephone Encounter (Signed)
Tried calling pt twice, it picked up but no one would speak. She needs an appt to discuss the d&c.

## 2021-06-02 NOTE — Telephone Encounter (Signed)
Patient states she has tried the medroxyprogesterone and it is not helping with her pelvic pain. States she did stop bleeding. States she wants to discuss a possible D&C. Patient states she saw Dr. Era Bumpers on [phone]. Please return call and advise.

## 2021-06-02 NOTE — Telephone Encounter (Signed)
Patient said when she was in on 05/19/21 with Dr. Pricilla Holm she discussed him prescribing Neurontin for her leg pain. Patient would like this prescribed. Please return call and advise.

## 2021-06-03 ENCOUNTER — Encounter: Payer: PRIVATE HEALTH INSURANCE | Attending: Obstetrics & Gynecology | Primary: Family Medicine

## 2021-06-04 MED ORDER — GABAPENTIN 100 MG PO CAPS
100 MG | ORAL_CAPSULE | Freq: Three times a day (TID) | ORAL | 0 refills | Status: DC
Start: 2021-06-04 — End: 2021-08-12

## 2021-06-04 NOTE — Telephone Encounter (Signed)
Let the patient know that I have re-ordered the gabapentin for 100 mg TID.  Will adjust as needed.

## 2021-06-04 NOTE — Telephone Encounter (Signed)
Pt called & advised.

## 2021-06-08 ENCOUNTER — Ambulatory Visit
Admit: 2021-06-08 | Discharge: 2021-06-08 | Payer: PRIVATE HEALTH INSURANCE | Attending: Obstetrics & Gynecology | Primary: Family Medicine

## 2021-06-08 DIAGNOSIS — N939 Abnormal uterine and vaginal bleeding, unspecified: Secondary | ICD-10-CM

## 2021-06-08 NOTE — H&P (Signed)
HISTORY AND PHYSICAL             Date: 06/08/2021        Patient Name: [name] [name]     Date of Birth: Mar 25, 1981      Age:  40 y.o.    Chief Complaint     Chief Complaint   Patient presents with    Procedure      ???aub???    History Obtained From   patient    History of Present Illness   [name] presents with 4-5 month h/o abnormal uterine bleeding.  Notes she will bleed off and on for nearly 1/2 half the month.  She also notes pain when on her period.    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 Authorizing Provider   gabapentin (NEURONTIN) 100 MG capsule Take 1 capsule by mouth in the morning and 1 capsule at noon and 1 capsule before bedtime. Do all this for 180 days. Intended supply: 90 days. 06/04/21 12/01/21  Silvano Bilis, DO   medroxyPROGESTERone (PROVERA) 10 MG tablet Take 1 tablet by mouth in the morning. From the 10th to the 20th of each month. [id number]   Erven Colla, MD   tranexamic acid (LYSTEDA) 650 MG TABS tablet Take 2 tablets by mouth 3 times daily [id number]   Erven Colla, MD   Blood Glucose Monitoring Suppl (ACCU-CHEK AVIVA) DEVI Accu-Chek Aviva Plus test strips   Take 1 strip twice a day by  miscell. route as directed for 30 days.   Check blood sugar in the morning before breakfast and two hours after dinner each day.    Historical Provider, MD   Alcohol Swabs PADS Use once a day as directed    re: type 2 diabetes 03/23/19   Historical Provider, MD   Lancet Devices (LANCING DEVICE) MISC Use once a day as directed    re: type 2 diabetes 03/23/19   Historical Provider, MD   acetaminophen (TYLENOL) 500 MG tablet Take 500 mg by mouth every 6 hours as needed    Historical Provider, MD   ONE TOUCH ULTRASOFT LANCETS MISC Use to test sugar once daily. 08/16/19   Silvano Bilis, DO    Suboxone- prescribed  by Waldon Reining in [place]   Latex, Latex, and Cephalexin    Social History     Social History       Tobacco History       Smoking Status  Former Smoking Frequency  0.50 packs/day for 11.00 years (5.50 pk-yrs) Smoking Tobacco Type  Cigarettes      Smokeless Tobacco Use  Never              Alcohol History       Alcohol Use Status  Yes Comment  every 2 months              Drug Use       Drug Use Status  Yes Types  Cocaine, Marijuana (Weed), Opiates  Comment  07/13/18 heroin, cocaine, marijuana              Sexual Activity       Sexually Active  Yes Partners  Female                    Family History     Family History   Problem Relation Age of Onset    Arthritis Other     Asthma Other     Cancer Other     Diabetes Other     Kidney Disease Other        Review of Systems   Review of Systems  Pertinent review of systems items discussed above.  All others systems items not discussed above were negative.    Physical Exam   BP 130/79    Temp (!) 69 ??F (20.6 ??C)    Wt 135 lb 3.2 oz (61.3 kg)    LMP 06/02/2021    BMI 22.50 kg/m??     Physical Exam    CV- RRR  Resp- bil CTA  Labs    No results found for this or any previous visit (from the past 24 hour(s)).     Imaging/Diagnostics Last 24 Hours   No results found.    Assessment    aub    Plan   Recommend hyst d and c .  I discussed the technique, recovery, and risks  with patient.  They include but are not limited to bleeding, infection, damage to internal organs (e.g., bladder, bowel, ureters).  Patient voiced understanding and agrees to proceed.  30 min >50% of time was spent discussing findings, management, and treatment options.      Consultations Ordered:  None    Electronically signed by Erven Colla, MD on [id number] at 10:56 AM EDT

## 2021-06-12 NOTE — Telephone Encounter (Signed)
From: Edgardo Roys Moxley  To: Dr. Betsey Holiday Flick  Sent: [id number] 2:14 PM EDT  Subject: Abdomen lower back thigh/leg pain    Good afternoon Dr. Era Bumpers, I'm having some sharp pains in my lower abdomen and lower back that's is radiating down my thighs and legs and it will not go away. I've had increased pain the past two days this being the third and it's really bad! What should I do. All I want to do is lay down and not move. The pain is just increasing. I take pain meds over the counter and they aren't working. What can I do? Thank you [name] [name].

## 2021-06-13 ENCOUNTER — Inpatient Hospital Stay
Admit: 2021-06-13 | Discharge: 2021-06-13 | Disposition: A | Discharge status: Home / Self Care | Payer: PRIVATE HEALTH INSURANCE

## 2021-06-13 ENCOUNTER — Emergency Department: Payer: PRIVATE HEALTH INSURANCE | Primary: Family Medicine

## 2021-06-13 ENCOUNTER — Emergency Department: Admit: 2021-06-13 | Payer: PRIVATE HEALTH INSURANCE | Primary: Family Medicine

## 2021-06-13 DIAGNOSIS — R0789 Other chest pain: Secondary | ICD-10-CM

## 2021-06-13 LAB — CBC WITH AUTO DIFFERENTIAL
Basophils %: 0.7 %
Basophils Absolute: 0 10*3/uL (ref 0.0–0.2)
Eosinophils %: 1.5 %
Eosinophils Absolute: 0.1 10*3/uL (ref 0.0–0.6)
Hematocrit: 40.9 % (ref 36.0–48.0)
Hemoglobin: 13.8 g/dL (ref 12.0–16.0)
Lymphocytes %: 31.8 %
Lymphocytes Absolute: 1.6 10*3/uL (ref 1.0–5.1)
MCH: 31.7 pg (ref 26.0–34.0)
MCHC: 33.7 g/dL (ref 31.0–36.0)
MCV: 93.9 fL (ref 80.0–100.0)
MPV: 8.4 fL (ref 5.0–10.5)
Monocytes %: 7.8 %
Monocytes Absolute: 0.4 10*3/uL (ref 0.0–1.3)
Neutrophils %: 58.2 %
Neutrophils Absolute: 2.9 10*3/uL (ref 1.7–7.7)
Platelets: 151 10*3/uL (ref 135–450)
RBC: 4.35 M/uL (ref 4.00–5.20)
RDW: 13.5 % (ref 12.4–15.4)
WBC: 5 10*3/uL (ref 4.0–11.0)

## 2021-06-13 LAB — URINALYSIS WITH REFLEX TO CULTURE
Bilirubin Urine: NEGATIVE
Blood, Urine: NEGATIVE
Glucose, Ur: NEGATIVE mg/dL
Ketones, Urine: NEGATIVE mg/dL
Leukocyte Esterase, Urine: NEGATIVE
Nitrite, Urine: NEGATIVE
Protein, UA: NEGATIVE mg/dL
Specific Gravity, UA: 1.022 (ref 1.005–1.030)
Urobilinogen, Urine: 0.2 E.U./dL (ref <2.0)
pH, UA: 8 (ref 5.0–8.0)

## 2021-06-13 LAB — APTT: aPTT: 26.1 s (ref 23.0–34.3)

## 2021-06-13 LAB — BASIC METABOLIC PANEL
Anion Gap: 11 (ref 3–16)
BUN: 11 mg/dL (ref 7–20)
CO2: 25 mmol/L (ref 21–32)
Calcium: 9 mg/dL (ref 8.3–10.6)
Chloride: 103 mmol/L (ref 99–110)
Creatinine: <0.5 mg/dL — ABNORMAL LOW (ref 0.6–1.1)
GFR African American: >60 (ref >60)
GFR Non-African American: >60 (ref >60)
Glucose: 120 mg/dL — ABNORMAL HIGH (ref 70–99)
Potassium: 4.3 mmol/L (ref 3.5–5.1)
Sodium: 139 mmol/L (ref 136–145)

## 2021-06-13 LAB — HCG, SERUM, QUALITATIVE: hCG Qual: NEGATIVE

## 2021-06-13 LAB — PROTIME-INR
INR: 1.02 (ref 0.87–1.14)
Protime: 13.3 s (ref 11.7–14.5)

## 2021-06-13 LAB — LIPASE: Lipase: 22 U/L (ref 13.0–60.0)

## 2021-06-13 MED ORDER — IBUPROFEN 600 MG PO TABS
600 MG | ORAL_TABLET | Freq: Three times a day (TID) | ORAL | 0 refills | Status: DC | PRN
Start: 2021-06-13 — End: 2021-10-06

## 2021-06-13 MED ORDER — METRONIDAZOLE 500 MG PO TABS
500 MG | ORAL_TABLET | Freq: Two times a day (BID) | ORAL | 0 refills | Status: AC
Start: 2021-06-13 — End: 2021-06-23

## 2021-06-13 MED ORDER — IOPAMIDOL 76 % IV SOLN
76 % | Freq: Once | INTRAVENOUS | Status: AC | PRN
Start: 2021-06-13 — End: 2021-06-13
  Administered 2021-06-13: 15:00:00 75 mL via INTRAVENOUS

## 2021-06-13 MED ORDER — ONDANSETRON HCL 4 MG/2ML IJ SOLN
4 MG/2ML | Freq: Once | INTRAMUSCULAR | Status: AC
Start: 2021-06-13 — End: 2021-06-13
  Administered 2021-06-13: 15:00:00 4 mg via INTRAVENOUS

## 2021-06-13 MED ORDER — MORPHINE SULFATE (PF) 2 MG/ML IV SOLN
2 MG/ML | Freq: Once | INTRAVENOUS | Status: AC
Start: 2021-06-13 — End: 2021-06-13
  Administered 2021-06-13: 15:00:00 2 mg via INTRAVENOUS

## 2021-06-13 MED FILL — ONDANSETRON HCL 4 MG/2ML IJ SOLN: 4 MG/2ML | INTRAMUSCULAR | Qty: 2

## 2021-06-13 MED FILL — MORPHINE SULFATE 2 MG/ML IJ SOLN: 2 mg/mL | INTRAMUSCULAR | Qty: 1

## 2021-06-13 NOTE — ED Provider Notes (Signed)
**ADVANCED PRACTICE PROVIDER, I HAVE EVALUATED THIS PATIENT**        [place]      Pt Name: [name] [name]  WJX:[id number]  [place] [birth year]  Date of evaluation: 06/13/2021  Provider: Florestine Avers, PA-C      Chief Complaint:    Chief Complaint   Patient presents with    Shortness of Breath    Chest Pain     Pt c/o SOB with right sided chest pain that radiates into the back starting this morning. Denies dizziness or cardiac hx.          Nursing Notes, Past Medical Hx, Past Surgical Hx, Social Hx, Allergies, and Family Hx were all reviewed and agreed with or any disagreements were addressed in the HPI.    HPI: (Location, Duration, Timing, Severity, Quality, Assoc Sx, Context, Modifying factors)    Chief Complaint of shortness of breath started earlier this morning.  Back pain.  Also complaining of suprapubic abdominal pain.  She supposed to have a D&C done with Dr. Era Bumpers for vaginal bleeding.  She denies fever.  Complain of some nausea no vomiting.  Denies cough.  No lightheaded or dizziness.  She has a history of chronic pain.  She has not taken anything for this pain.    This is a  40 y.o. female who presents presents emergency room with the above complaint.    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 route as directed for 30 days.   Check blood sugar in the morning before breakfast and two hours after dinner each day.    GABAPENTIN (NEURONTIN) 100 MG CAPSULE    Take 1 capsule by mouth in the morning and 1 capsule at noon and 1 capsule before bedtime. Do all this for 180 days. Intended supply: 90 days.    LANCET DEVICES (LANCING DEVICE) MISC    Use once a day as directed    re: type 2 diabetes    MEDROXYPROGESTERONE (  PROVERA) 10 MG TABLET    Take 1 tablet by mouth in the morning. From the 10th to the 20th of each month.    ONE TOUCH ULTRASOFT LANCETS MISC    Use to test sugar once daily.    TRANEXAMIC ACID (LYSTEDA) 650 MG TABS TABLET    Take 2 tablets by mouth 3 times daily         Review of Systems:  (2-9 systems needed)  Review of Systems   Constitutional:  Negative for chills and fever.   HENT:  Negative for congestion and sore throat.    Eyes:  Negative for pain and visual disturbance.   Respiratory:  Positive for shortness of breath. Negative for cough.    Cardiovascular:  Negative for chest pain and leg swelling.   Gastrointestinal:  Positive for abdominal pain. Negative for nausea and vomiting.   Genitourinary:  Negative for dysuria and frequency.   Musculoskeletal:  Positive for back pain. Negative for neck pain.   Skin:  Negative for rash and wound.   Neurological:  Negative for dizziness and light-headedness.     "Positives and Pertinent negatives as per HPI"    Physical Exam:  Physical Exam  Vitals and nursing note reviewed.   Constitutional:       Appearance: She is well-developed. She is not diaphoretic.   HENT:      Head: Normocephalic and atraumatic.      Nose: Nose  normal.      Mouth/Throat:      Mouth: Mucous membranes are moist.      Pharynx: Oropharynx is clear. No oropharyngeal exudate or posterior oropharyngeal erythema.   Eyes:      General:         Right eye: No discharge.         Left eye: No discharge.   Cardiovascular:      Rate and Rhythm: Normal rate and regular rhythm.      Heart sounds: Normal heart sounds. No murmur heard.    No friction rub. No gallop.   Pulmonary:      Effort: Pulmonary effort is normal. No respiratory distress.      Breath sounds: Normal breath sounds. No wheezing or rales.   Chest:      Chest wall: No tenderness.   Abdominal:      General: Abdomen is flat. Bowel sounds are normal. There is no distension.      Palpations: Abdomen is soft. There is no mass.      Tenderness: There is abdominal tenderness in the suprapubic area. There is no guarding or rebound.   Musculoskeletal:         General: Normal range of motion.      Cervical back: Normal range of motion and neck supple.   Skin:     General: Skin is warm and dry.   Neurological:      General: No focal deficit present.      Mental Status: She is alert and oriented to person, place, and time.   Psychiatric:         Behavior: Behavior normal.       MEDICAL DECISION MAKING    Vitals:    Vitals:    06/13/21 1007 06/13/21 1142   BP: (!) 152/90 (!) 148/84   Pulse: 75 62   Resp: 18 16   Temp: 97.4 ??F (36.3 ??C) 97.6 ??F (36.4 ??C)   TempSrc: Oral Oral   SpO2: 99% 100%   Weight: 136  lb 7.4 oz (61.9 kg)        LABS:  Labs Reviewed   BASIC METABOLIC PANEL - Abnormal; Notable for the following components:       Result Value    Glucose 120 (*)     Creatinine <0.5 (*)     All other components within normal limits   CBC WITH AUTO DIFFERENTIAL   LIPASE   URINALYSIS WITH REFLEX TO CULTURE   HCG, SERUM, QUALITATIVE   PROTIME-INR   APTT        Remainder of labs reviewed and were negative at this time or not returned at the time of this note.    RADIOLOGY:   Non-plain film images such as CT, Ultrasound and MRI  are read by the radiologist. I, Florestine Avers, PA-C have directly visualized the radiologic plain film image(s) with the below findings:      Interpretation per the Radiologist below, if available at the time of this note:    CT ABDOMEN PELVIS W IV CONTRAST Additional Contrast? None   Final Result   1. Fluid-filled loops of nondistended distal small bowel may represent   changes of underlying enteritis.  No pattern of obstruction.   2. Physiologic menstrual changes in the pelvis.      RECOMMENDATIONS:   2.6 cm left adnexal hemorrhagic cyst. No follow-up imaging is recommended.      Reference: Radiology 2010 Sep;256(3):943-54            XR CHEST (2 VW)   Final Result   No significant findings in the chest.              XR CHEST (2 VW)    Result Date: 06/13/2021  EXAMINATION: TWO XRAY VIEWS OF THE CHEST 06/13/2021 10:29 am COMPARISON: 07/13/2018 radiograph HISTORY: ORDERING SYSTEM PROVIDED HISTORY: Chest Discomfort.  Shortness of breath TECHNOLOGIST PROVIDED HISTORY: Reason for exam:->Chest Discomfort.  Shortness of breath FINDINGS: The heart, mediastinum and pulmonary vascularity are normal.  Lungs are well-expanded and clear. No skeletal abnormalities are present in the chest.     No significant findings in the chest.     CT ABDOMEN PELVIS W IV CONTRAST Additional Contrast? None    Result Date: 06/13/2021  EXAMINATION: CT OF THE ABDOMEN AND PELVIS WITH CONTRAST 06/13/2021 11:00 am TECHNIQUE: CT of the abdomen and pelvis was performed with the administration of intravenous contrast. Multiplanar reformatted images are provided for review. Automated exposure control, iterative reconstruction, and/or weight based adjustment of the mA/kV was utilized to reduce the radiation dose to as low as reasonably achievable. COMPARISON: 08/01/2017 CT HISTORY: ORDERING SYSTEM PROVIDED HISTORY: Suprapubic abdominal pain TECHNOLOGIST PROVIDED HISTORY: Additional Contrast?->None Reason for exam:->Suprapubic abdominal pain Decision Support  Exception - unselect if not a suspected or confirmed emergency medical condition->Emergency Medical Condition (MA) Reason for Exam: Suprapubic abdominal pain FINDINGS: Lower Chest:  The lung bases are clear.  The base of the heart is normal. Organs: The liver, gallbladder, biliary ducts, pancreas and spleen are normal. Kidneys and adrenal glands are normal. GI/Bowel: The stomach, duodenum and proximal small bowel are normal.  There are scattered, fluid-filled loops of nondistended ileum distally with several air-fluid levels noted.  No mucosal abnormality or pattern of obstruction.  A normal appendix is visualized. The colon is normal. Pelvis: The bladder is unremarkable. The uterus is normal.  Right adnexa normal.  2.6 cm hemorrhagic cyst in the left adnexa. Peritoneum/Retroperitoneum: The aorta tapers normally.  No lymph node enlargement. Bones/Soft Tissues: No significant skeletal  abnormalities.     1. Fluid-filled loops of nondistended distal small bowel may represent changes of underlying enteritis.  No pattern of obstruction. 2. Physiologic menstrual changes in the pelvis. RECOMMENDATIONS: 2.6 cm left adnexal hemorrhagic cyst. No follow-up imaging is recommended. Reference: Radiology 2010 Sep;256(3):943-54          MEDICAL DECISION MAKING / ED COURSE:      PROCEDURES:   Procedures    None    Patient was given:  Medications   ondansetron (ZOFRAN) injection 4 mg (4 mg IntraVENous Given 06/13/21 1033)   morphine (PF) injection 2 mg (2 mg IntraVENous Given 06/13/21 1033)   iopamidol (ISOVUE-370) 76 % injection 75 mL (75 mLs IntraVENous Given 06/13/21 1108)     Emergency room course: Patient on exam throat is clear.  Cardiovascular regular rhythm, lungs are clear.  No wheeze rales or rhonchi noted.  No reproducible chest wall tenderness.  Abdomen is soft mild suprapubic tenderness with palpation.  No rebound or guarding noted.  Normal bowel sounds all 4 quadrant.  No CVA or flank tenderness.  Patient has full range of  motion of extremity alert oriented x4.  Does not appear to be in acute distress.      Lab from today shows:  CBC within normal limits with a white count of 5.0.  BMP unremarkable.  Lipase paced 22.0.  PT 13.3 with INR 1.02, PTT 26.1.    Urinalysis shows negative leukocytes, negative for nitrites, negative for blood, negative for ketones.    CT scan possible changes of enteritis see above.  Does show a left adnexal cyst.    Chest x-ray showed no acute abnormality.    Discussed patient results from today with her.  Discussed sending her home on Flagyl.  Have her follow-up with GI if she have any further abdominal pain.  Keep her appointment with Dr. Era Bumpers.  Take OTC Motrin or Tylenol for any pain.  She will be discharged stable condition      The patient tolerated their visit well.  I evaluated the patient.  The physician was available for consultation as needed.  The patient and / or the family were informed of the results of any tests, a time was given to answer questions, a plan was proposed and they agreed with plan.      CLINICAL IMPRESSION:  1. Enteritis    2. Left ovarian cyst        DISPOSITION Decision To Discharge 06/13/2021 12:42:56 PM      PATIENT REFERRED TO:  Silvano Bilis, DO  [address] [place]  [place] [address]  [phone]    Call in 2 days      [address], [address]  [place] [address]  [phone]    Call   If symptoms worsen    DISCHARGE MEDICATIONS:  New Prescriptions    IBUPROFEN (ADVIL;MOTRIN) 600 MG TABLET    Take 1 tablet by mouth every 8 hours as needed for Pain    METRONIDAZOLE (FLAGYL) 500 MG TABLET    Take 1 tablet by mouth in the morning and 1 tablet before bedtime. Do all this for 10 days.       DISCONTINUED MEDICATIONS:  Discontinued Medications    No medications on file              (Please note the MDM and HPI sections of this note were completed with a voice recognition  program.  Efforts were made to edit the dictations but  occasionally words are mis-transcribed.)    Electronically signed, Florestine Avers, PA-C,           Florestine Avers, PA-C  06/13/21 1247

## 2021-06-13 NOTE — ED Provider Notes (Signed)
EKG: Normal sinus rhythm, rate of 62, age-indeterminate septal infarct.  Rhythm strip shows a sinus rhythm with a rate of 62, PR interval 150 ms, QRS 82 ms with no other ectopy as interpreted by me.  This compared to an EKG dated 07/14/2018, no significant change noted.     Maren Beach, MD  06/13/21 1331

## 2021-06-13 NOTE — Discharge Instructions (Addendum)
Follow-up with your primary care physician Dr. Pricilla Holm  Follow-up with gastroenterologist Dr. Abner Greenspan if symptoms worsens or no improvement  Keep your appointment with Dr. Era Bumpers  Take prescribed medication as prescribed on  Return to emergency room as needed

## 2021-06-15 ENCOUNTER — Ambulatory Visit
Admit: 2021-06-15 | Discharge: 2021-06-15 | Payer: PRIVATE HEALTH INSURANCE | Attending: Obstetrics & Gynecology | Primary: Family Medicine

## 2021-06-15 DIAGNOSIS — N83202 Unspecified ovarian cyst, left side: Secondary | ICD-10-CM

## 2021-06-15 LAB — EKG 12-LEAD
Atrial Rate: 62 {beats}/min
P Axis: 73 degrees
P-R Interval: 150 ms
Q-T Interval: 406 ms
QRS Duration: 82 ms
QTc Calculation (Bazett): 412 ms
R Axis: 93 degrees
T Axis: 55 degrees
Ventricular Rate: 62 {beats}/min

## 2021-06-15 MED ORDER — TRAMADOL HCL 50 MG PO TABS
50 MG | ORAL_TABLET | Freq: Four times a day (QID) | ORAL | 0 refills | Status: AC | PRN
Start: 2021-06-15 — End: 2021-06-20

## 2021-06-15 MED ORDER — PROMETHAZINE HCL 12.5 MG PO TABS
12.5 MG | ORAL_TABLET | Freq: Four times a day (QID) | ORAL | 0 refills | Status: AC | PRN
Start: 2021-06-15 — End: 2021-06-22

## 2021-06-15 NOTE — H&P (Signed)
HISTORY AND PHYSICAL             Date: 06/15/2021        Patient Name: [name] [name]     Date of Birth: 1981-03-28      Age:  40 y.o.    Chief Complaint     Chief Complaint   Patient presents with    Follow-up     Pt here for ER f/u of hemorraghic cyst on ovary      ???left sided pelvic pain???    History Obtained From   patient    History of Present Illness   [name] presents with one week h/o left sided pelvic pain.  CT showed 2.6 cm left ov cyst.  She is already scheduled for a hyst d and c later this week.    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 Authorizing Provider   metroNIDAZOLE (FLAGYL) 500 MG tablet Take 1 tablet by mouth in the morning and 1 tablet before bedtime. Do all this for 10 days. 06/13/21 06/23/21  Florestine Avers, PA-C   ibuprofen (ADVIL;MOTRIN) 600 MG tablet Take 1 tablet by mouth every 8 hours as needed for Pain 06/13/21   Florestine Avers, PA-C   gabapentin (NEURONTIN) 100 MG capsule Take 1 capsule by mouth in the morning and 1 capsule at noon and 1 capsule before bedtime. Do all this for 180 days. Intended supply: 90 days. 06/04/21 12/01/21  Silvano Bilis, DO   medroxyPROGESTERone (PROVERA) 10 MG  tablet Take 1 tablet by mouth in the morning. From the 10th to the 20th of each month. 0000000   Erven Colla, MD   tranexamic acid (LYSTEDA) 650 MG TABS tablet Take 2 tablets by mouth 3 times daily [id number]   Erven Colla, MD   Blood Glucose Monitoring Suppl (ACCU-CHEK AVIVA) DEVI Accu-Chek Aviva Plus test strips   Take 1 strip twice a day by miscell. route as directed for 30 days.   Check blood sugar in the morning before breakfast and two hours after dinner each day.    Historical Provider, MD   Alcohol Swabs PADS Use once a day as directed    re: type 2 diabetes 03/23/19   Historical Provider, MD  Lancet Devices (LANCING DEVICE) MISC Use once a day as directed    re: type 2 diabetes 03/23/19   Historical Provider, MD   acetaminophen (TYLENOL) 500 MG tablet Take 500 mg by mouth every 6 hours as needed    Historical Provider, MD   ONE TOUCH ULTRASOFT LANCETS MISC Use to test sugar once daily. 08/16/19   Silvano Bilis, DO        Allergies   Latex, Latex, and Cephalexin    Social History     Social History       Tobacco History       Smoking Status  Former Smoking Frequency  0.50 packs/day for 11.00 years (5.50 pk-yrs) Smoking Tobacco Type  Cigarettes      Smokeless Tobacco Use  Never              Alcohol History       Alcohol Use Status  Yes Comment  every 2 months              Drug Use       Drug Use Status  Yes Types  Cocaine, Marijuana (Weed), Opiates  Comment  07/13/18 heroin, cocaine, marijuana              Sexual Activity       Sexually Active  Yes Partners  Female                    Family History     Family History   Problem Relation Age of Onset    Arthritis Other     Asthma Other     Cancer Other     Diabetes Other     Kidney Disease Other        Review of Systems   Review of Systems  Pertinent review of systems items discussed above.  All others systems items not discussed above were negative.    Physical Exam   BP 130/87    Pulse 88    Wt 135 lb (61.2 kg)    LMP 06/02/2021    BMI 22.47 kg/m??      Physical Exam    CV- RRR  Resp- bil CTA    Labs    No results found for this or any previous visit (from the past 24 hour(s)).     Imaging/Diagnostics Last 24 Hours   No results found.    Assessment    pelvic pain, left ov cyst    Plan   Recommend operative laparoscopy with drainage of left ov cyst- procedure to be in addition to hyst d and c.  I discussed the technique, recovery, and risks with patient.  They include but are not limited to bleeding, infection, damage to internal organs (e.g., bladder, bowel, ureters).  Patient voiced understanding and agrees to proceed.  35 min >50% of time was spent discussing findings, management, and treatment options.      Consultations Ordered:  None    Electronically signed by Erven Colla, MD on [id number] at 2:53 PM EDT

## 2021-06-15 NOTE — Telephone Encounter (Signed)
Please let her know I'm not in the office. I'm not sure why she is in pain.  If too much she should go back to the ER.  Can try to see her tomorrow or Wed Depending on my schedule.  Can double book on wed.

## 2021-06-15 NOTE — Telephone Encounter (Signed)
From: Edgardo Roys Moxley  To: Dr. Silvano Bilis  Sent: 06/15/2021 10:27 AM EDT  Subject: ER visit    I went to the ER Saturday I was told to follow up w my doctor Monday I am in so much pain I'm about ready to go back to the ER. Can someone please reach out to me as soon as po6. Thank you

## 2021-06-16 NOTE — Progress Notes (Signed)
C-diff Questionnaire:     * Admitted with diarrhea?                         '[]'$  YES    '[x]'$   NO     *Prior history of C-Diff. In last 3 months? '[]'$  YES    '[x]'$   NO     *Antibiotic use in the past 6-8 weeks?      '[]'$   NO    '[x]'$   YES      If yes, which: REASON__colitis_______     *Prior hospitalization or nursing home in the last month? '[]'$   YES    '[x]'$   NO     SAFETY FIRST..call before you fall    [place] time__830       Surgery time_955    Do not eat or drink anything after 12:00 midnight prior to your surgery.  This includes water chewing gum, mints and ice chips- the Day of Surgery.  You may brush your teeth and gargle the morning of your surgery, but do not swallow the water     Please see your family doctor/pediatrician for a history and physical and/or questions concerning medications.   Bring any test results/reports from your physicians office.   If you are under the care of a heart doctor or specialist doctor, please be aware that you may be asked to them for clearance    You may be asked to stop blood thinners such as Coumadin, Plavix, Fragmin, Lovenox, etc., or any anti-inflammatories such as:  Aspirin, Ibuprofen, Advil, Naproxen prior to your surgery.    We also ask that you stop any OTC medications such as fish oil, vitamin E, glucosamine, garlic, Multivitamins, COQ 10, etc.    We ask that you do not smoke 24 hours prior to surgery  We ask that you do not  drink any alcoholic beverages 24 hours prior to surgery     You must make arrangements for a responsible adult to take you home after your surgery.    For your safety you will not be allowed to leave alone or drive yourself home.  Your surgery will be cancelled if you do not have a ride home.     Also for your safety, it is strongly suggested that someone stay with you the first 24 hours after your surgery.     A parent or legal guardian must accompany a child scheduled for surgery and plan to stay at the  hospital until the child is discharged.    Please do not bring other children with you.    For your comfort, please wear simple loose fitting clothing to the hospital.  Please do not bring valuables. Do not wear any make-up or nail polish on your fingers or toes.  For your safety, please do not wear any jewelry or body piercing's on the day of surgery. All jewelry must be removed.     If you have dentures, they will be removed before going to operating room.    For your convenience, we will provide you with a container.    If you wear contact lenses or glasses, they will be removed, please bring a case for them.     If you have a living will and a durable power of attorney for healthcare, please bring in a copy.     As part of our patient safety program to minimize surgical site  infections, we ask you to do the following:    Please notify your surgeon if you develop any illness between         now and the day of your surgery.    This includes a cough, cold, fever, sore throat, nausea,         or vomiting, and diarrhea, etc.   Please notify your surgeon if you experience dizziness, shortness         of breath or blurred vision between now and the time of your surgery.      Do not shave your operative site 96 hours prior to surgery.   For face and neck surgery, men may use an electric razor 48 hours   prior to surgery.    You may shower the night before surgery or the morning of   your surgery with an antibacterial soap.    You will need to bring a photo ID and insurance card     If you use a C-pap or Bi-pap machine, please bring your machine with you to the hospital     Our goal is to provide you with excellent care, therefore, visitors will be limited to so that we may focus on providing this care for you.          Please contact your surgeon office, if you have any further questions.                 [facility] phone number:  [facility] fax number:  [phone]    Please note these are generalized  instructions for all surgical cases, you may be provided with more specific instructions according to your surgery.

## 2021-06-19 ENCOUNTER — Ambulatory Visit: Attending: Obstetrics & Gynecology | Primary: Family Medicine

## 2021-06-19 ENCOUNTER — Encounter: Payer: PRIVATE HEALTH INSURANCE | Attending: Obstetrics & Gynecology | Primary: Family Medicine

## 2021-06-19 DIAGNOSIS — N83202 Unspecified ovarian cyst, left side: Secondary | ICD-10-CM

## 2021-06-19 MED ORDER — OXYCODONE-ACETAMINOPHEN 5-325 MG PO TABS
5-325 MG | ORAL_TABLET | Freq: Four times a day (QID) | ORAL | 0 refills | Status: AC | PRN
Start: 2021-06-19 — End: 2021-06-24

## 2021-06-22 NOTE — Progress Notes (Signed)
Pt states no new changes in medical info or needs from PAT interview on 06/16/2021    C-diff Questionnaire:     * Admitted with diarrhea?                         '[]'$  YES    '[x]'$   NO     *Prior history of C-Diff. In last 3 months? '[]'$  YES    '[x]'$   NO     *Antibiotic use in the past 6-8 weeks?      '[]'$   NO    '[x]'$   YES      If yes, which: REASON_________________     *Prior hospitalization or nursing home in the last month? '[]'$   YES    '[x]'$   NO     SAFETY FIRST..call before you fall    [place] time____830        Surgery time_1010    Do not eat or drink anything after 12:00 midnight prior to your surgery.  This includes water chewing gum, mints and ice chips- the Day of Surgery.  You may brush your teeth and gargle the morning of your surgery, but do not swallow the water     Please see your family doctor/pediatrician for a history and physical and/or questions concerning medications.   Bring any test results/reports from your physicians office.   If you are under the care of a heart doctor or specialist doctor, please be aware that you may be asked to them for clearance    You may be asked to stop blood thinners such as Coumadin, Plavix, Fragmin, Lovenox, etc., or any anti-inflammatories such as:  Aspirin, Ibuprofen, Advil, Naproxen prior to your surgery.    We also ask that you stop any OTC medications such as fish oil, vitamin E, glucosamine, garlic, Multivitamins, COQ 10, etc.    We ask that you do not smoke 24 hours prior to surgery  We ask that you do not  drink any alcoholic beverages 24 hours prior to surgery     You must make arrangements for a responsible adult to take you home after your surgery.    For your safety you will not be allowed to leave alone or drive yourself home.  Your surgery will be cancelled if you do not have a ride home.     Also for your safety, it is strongly suggested that someone stay with you the first 24 hours after your surgery.     A parent  or legal guardian must accompany a child scheduled for surgery and plan to stay at the hospital until the child is discharged.    Please do not bring other children with you.    For your comfort, please wear simple loose fitting clothing to the hospital.  Please do not bring valuables. Do not wear any make-up or nail polish on your fingers or toes.  For your safety, please do not wear any jewelry or body piercing's on the day of surgery. All jewelry must be removed.     If you have dentures, they will be removed before going to operating room.    For your convenience, we will provide you with a container.    If you wear contact lenses or glasses, they will be removed, please bring a case for them.     If you have a living will and a durable power of attorney for healthcare, please  bring in a copy.     As part of our patient safety program to minimize surgical site infections, we ask you to do the following:    Please notify your surgeon if you develop any illness between         now and the day of your surgery.    This includes a cough, cold, fever, sore throat, nausea,         or vomiting, and diarrhea, etc.   Please notify your surgeon if you experience dizziness, shortness         of breath or blurred vision between now and the time of your surgery.      Do not shave your operative site 96 hours prior to surgery.   For face and neck surgery, men may use an electric razor 48 hours   prior to surgery.    You may shower the night before surgery or the morning of   your surgery with an antibacterial soap.    You will need to bring a photo ID and insurance card     If you use a C-pap or Bi-pap machine, please bring your machine with you to the hospital     Our goal is to provide you with excellent care, therefore, visitors will be limited to so that we may focus on providing this care for you.          Please contact your surgeon office, if you have any further questions.                 [facility] phone number:   [place] PAT fax number:  [phone]    Please note these are generalized instructions for all surgical cases, you may be provided with more specific instructions according to your surgery.

## 2021-06-25 ENCOUNTER — Inpatient Hospital Stay: Payer: PRIVATE HEALTH INSURANCE

## 2021-06-25 LAB — POC PREGNANCY UR-QUAL: Pregnancy, Urine: NEGATIVE

## 2021-06-25 MED ORDER — LACTATED RINGERS IV SOLN
INTRAVENOUS | Status: DC
Start: 2021-06-25 — End: 2021-06-25

## 2021-06-25 MED ORDER — FENTANYL CITRATE (PF) 100 MCG/2ML IJ SOLN
100 MCG/2ML | INTRAMUSCULAR | Status: DC | PRN
Start: 2021-06-25 — End: 2021-06-25
  Administered 2021-06-25 (×2): 50 via INTRAVENOUS

## 2021-06-25 MED ORDER — NORMAL SALINE FLUSH 0.9 % IV SOLN
0.9 % | Freq: Two times a day (BID) | INTRAVENOUS | Status: DC
Start: 2021-06-25 — End: 2021-06-25

## 2021-06-25 MED ORDER — NORMAL SALINE FLUSH 0.9 % IV SOLN
0.9 % | INTRAVENOUS | Status: DC | PRN
Start: 2021-06-25 — End: 2021-06-25

## 2021-06-25 MED ORDER — PROPOFOL 200 MG/20ML IV EMUL
20020 MG/20ML | INTRAVENOUS | Status: DC | PRN
Start: 2021-06-25 — End: 2021-06-25
  Administered 2021-06-25: 15:00:00 200 via INTRAVENOUS

## 2021-06-25 MED ORDER — SUGAMMADEX SODIUM 200 MG/2ML IV SOLN
200 MG/2ML | INTRAVENOUS | Status: AC
Start: 2021-06-25 — End: ?

## 2021-06-25 MED ORDER — LIDOCAINE HCL (PF) 2 % IJ SOLN
2 % | INTRAMUSCULAR | Status: AC
Start: 2021-06-25 — End: ?

## 2021-06-25 MED ORDER — DEXAMETHASONE SODIUM PHOSPHATE 20 MG/5ML IJ SOLN
20 MG/5ML | INTRAMUSCULAR | Status: DC | PRN
Start: 2021-06-25 — End: 2021-06-25
  Administered 2021-06-25: 15:00:00 8 via INTRAVENOUS

## 2021-06-25 MED ORDER — OXYCODONE-ACETAMINOPHEN 5-325 MG PO TABS
5-325 MG | Freq: Once | ORAL | Status: AC | PRN
Start: 2021-06-25 — End: 2021-06-25
  Administered 2021-06-25: 17:00:00 2 via ORAL

## 2021-06-25 MED ORDER — ROCURONIUM BROMIDE 50 MG/5ML IV SOLN
50 MG/5ML | INTRAVENOUS | Status: AC
Start: 2021-06-25 — End: ?

## 2021-06-25 MED ORDER — SODIUM CHLORIDE 0.9 % IR SOLN
0.9 % | Status: DC | PRN
Start: 2021-06-25 — End: 2021-06-25
  Administered 2021-06-25: 15:00:00 3000

## 2021-06-25 MED ORDER — ONDANSETRON HCL 4 MG/2ML IJ SOLN
4 MG/2ML | Freq: Once | INTRAMUSCULAR | Status: AC | PRN
Start: 2021-06-25 — End: 2021-06-25
  Administered 2021-06-25: 17:00:00 4 mg via INTRAVENOUS

## 2021-06-25 MED ORDER — LIDOCAINE HCL (PF) 2 % IJ SOLN
2 % | INTRAMUSCULAR | Status: DC | PRN
Start: 2021-06-25 — End: 2021-06-25
  Administered 2021-06-25: 15:00:00 60 via INTRAVENOUS

## 2021-06-25 MED ORDER — HYDROMORPHONE 0.5MG/0.5ML IJ SOLN
1 MG/ML | Status: AC | PRN
Start: 2021-06-25 — End: 2021-06-25
  Administered 2021-06-25 (×4): 0.5 mg via INTRAVENOUS

## 2021-06-25 MED ORDER — DEXMEDETOMIDINE HCL 200 MCG/2ML IV SOLN
2002 MCG/2ML | INTRAVENOUS | Status: DC | PRN
Start: 2021-06-25 — End: 2021-06-25
  Administered 2021-06-25 (×5): 4 via INTRAVENOUS

## 2021-06-25 MED ORDER — OXYCODONE-ACETAMINOPHEN 5-325 MG PO TABS
5-325 MG | Freq: Once | ORAL | Status: DC | PRN
Start: 2021-06-25 — End: 2021-06-25

## 2021-06-25 MED ORDER — ROCURONIUM BROMIDE 50 MG/5ML IV SOLN
50 MG/5ML | INTRAVENOUS | Status: DC | PRN
Start: 2021-06-25 — End: 2021-06-25
  Administered 2021-06-25: 15:00:00 50 via INTRAVENOUS
  Administered 2021-06-25: 15:00:00 30 via INTRAVENOUS

## 2021-06-25 MED ORDER — SODIUM CHLORIDE 0.9 % IV SOLN
0.9 % | INTRAVENOUS | Status: DC
Start: 2021-06-25 — End: 2021-06-25
  Administered 2021-06-25: 14:00:00 via INTRAVENOUS

## 2021-06-25 MED ORDER — ONDANSETRON HCL 4 MG/2ML IJ SOLN
4 MG/2ML | INTRAMUSCULAR | Status: AC
Start: 2021-06-25 — End: ?

## 2021-06-25 MED ORDER — MIDAZOLAM HCL 2 MG/2ML IJ SOLN
2 MG/ML | INTRAMUSCULAR | Status: DC | PRN
Start: 2021-06-25 — End: 2021-06-25
  Administered 2021-06-25: 15:00:00 2 via INTRAVENOUS

## 2021-06-25 MED ORDER — ONDANSETRON HCL 4 MG/2ML IJ SOLN
4 MG/2ML | INTRAMUSCULAR | Status: DC | PRN
Start: 2021-06-25 — End: 2021-06-25
  Administered 2021-06-25: 15:00:00 4 via INTRAVENOUS

## 2021-06-25 MED ORDER — FENTANYL CITRATE (PF) 100 MCG/2ML IJ SOLN
100 MCG/2ML | INTRAMUSCULAR | Status: AC
Start: 2021-06-25 — End: ?

## 2021-06-25 MED ORDER — DEXAMETHASONE SODIUM PHOSPHATE 20 MG/5ML IJ SOLN
20 MG/5ML | INTRAMUSCULAR | Status: AC
Start: 2021-06-25 — End: ?

## 2021-06-25 MED ORDER — PROPOFOL 200 MG/20ML IV EMUL
200 MG/20ML | INTRAVENOUS | Status: AC
Start: 2021-06-25 — End: ?

## 2021-06-25 MED ORDER — SUGAMMADEX SODIUM 200 MG/2ML IV SOLN
2002 MG/2ML | INTRAVENOUS | Status: DC | PRN
Start: 2021-06-25 — End: 2021-06-25
  Administered 2021-06-25: 15:00:00 200 via INTRAVENOUS

## 2021-06-25 MED ORDER — HYDROMORPHONE HCL 1 MG/ML IJ SOLN
1 MG/ML | INTRAMUSCULAR | Status: AC
Start: 2021-06-25 — End: ?

## 2021-06-25 MED ORDER — SODIUM CHLORIDE 0.9 % IV SOLN
0.9 % | INTRAVENOUS | Status: DC | PRN
Start: 2021-06-25 — End: 2021-06-25

## 2021-06-25 MED ORDER — HYDROMORPHONE HCL 1 MG/ML IJ SOLN
1 MG/ML | INTRAMUSCULAR | Status: DC | PRN
Start: 2021-06-25 — End: 2021-06-25
  Administered 2021-06-25 (×2): .5 via INTRAVENOUS

## 2021-06-25 MED ORDER — MIDAZOLAM HCL 2 MG/2ML IJ SOLN
2 MG/ML | INTRAMUSCULAR | Status: AC
Start: 2021-06-25 — End: ?

## 2021-06-25 MED ORDER — FENTANYL CITRATE (PF) 100 MCG/2ML IJ SOLN
100 MCG/2ML | INTRAMUSCULAR | Status: DC | PRN
Start: 2021-06-25 — End: 2021-06-25

## 2021-06-25 MED ORDER — OXYCODONE-ACETAMINOPHEN 5-325 MG PO TABS
5-325 MG | ORAL_TABLET | Freq: Four times a day (QID) | ORAL | 0 refills | Status: AC | PRN
Start: 2021-06-25 — End: 2021-07-02
  Filled 2021-06-25: qty 28, 7d supply, fill #0

## 2021-06-25 MED FILL — HYDROMORPHONE HCL 1 MG/ML IJ SOLN: 1 mg/mL | INTRAMUSCULAR | Qty: 1

## 2021-06-25 MED FILL — HYDROMORPHONE HCL 1 MG/ML IJ SOLN: 1 mg/mL | INTRAMUSCULAR | Qty: 0.5

## 2021-06-25 MED FILL — DIPRIVAN 200 MG/20ML IV EMUL: 200 MG/20ML | INTRAVENOUS | Qty: 20

## 2021-06-25 MED FILL — ONDANSETRON HCL 4 MG/2ML IJ SOLN: 4 MG/2ML | INTRAMUSCULAR | Qty: 2

## 2021-06-25 MED FILL — DEXAMETHASONE SODIUM PHOSPHATE 20 MG/5ML IJ SOLN: 20 MG/5ML | INTRAMUSCULAR | Qty: 5

## 2021-06-25 MED FILL — FENTANYL CITRATE (PF) 100 MCG/2ML IJ SOLN: 100 MCG/2ML | INTRAMUSCULAR | Qty: 2

## 2021-06-25 MED FILL — BRIDION 200 MG/2ML IV SOLN: 200 MG/2ML | INTRAVENOUS | Qty: 2

## 2021-06-25 MED FILL — MIDAZOLAM HCL 2 MG/2ML IJ SOLN: 2 mg/mL | INTRAMUSCULAR | Qty: 2

## 2021-06-25 MED FILL — ROCURONIUM BROMIDE 50 MG/5ML IV SOLN: 50 MG/5ML | INTRAVENOUS | Qty: 5

## 2021-06-25 MED FILL — XYLOCAINE-MPF 2 % IJ SOLN: 2 % | INTRAMUSCULAR | Qty: 5

## 2021-06-25 MED FILL — OXYCODONE-ACETAMINOPHEN 5-325 MG PO TABS: 5-325 mg | ORAL | Qty: 2

## 2021-06-25 NOTE — Brief Op Note (Signed)
Brief Postoperative Note      Patient: [name] [name]  Date of Birth: [birth year]  MRN: [id number]    Date of Procedure: 06/25/2021    Pre-Op Diagnosis: pelvic pain, LEFT OVARIAN CYST    Post-Op Diagnosis: Same plus bilateral ovarian cysts       Procedure:  hyst d and c, operative laparoscopy with drainage of bilateral ovarian cysts    Surgeon(s):  Erven Colla, MD    Assistant:  Surgical Assistant: Isaac Laud    Anesthesia: General    Estimated Blood Loss (mL): less than 50     Complications: None    Specimens: endometrial curretings  * No specimens in log *    Implants:  * No implants in log *      Drains: * No LDAs found *    Findings: bilateral ovarian cysts    Electronically signed by Erven Colla, MD on 0000000 at 11:23 AM

## 2021-06-25 NOTE — Anesthesia Pre-Procedure Evaluation (Signed)
Department of Anesthesiology  Preprocedure Note       Name:  [name] [name]   Age:  40 y.o.  DOB:  09/24/1981                                          MRN:  [id number]         Date:  06/25/2021      Surgeon: Juliann Mule):  Erven Colla, MD    Procedure: Procedure(s):  DILATATION AND CURETTAGE HYSTEROSCOPY  OPERATIVE LAPAROSCOPY, DRAINAGE OF LEFT OVARIAN CYST    Medications prior to admission:   Prior to Admission medications    Medication Sig Start Date End Date Taking? Authorizing Provider   ibuprofen (ADVIL;MOTRIN) 600 MG tablet Take 1 tablet by mouth every 8 hours as needed for Pain 06/13/21   Florestine Avers, PA-C   gabapentin (NEURONTIN) 100 MG capsule Take 1 capsule by mouth in the morning and 1 capsule at noon and 1 capsule before bedtime. Do all this for 180 days. Intended supply: 90 days. 06/04/21 12/01/21  Silvano Bilis, DO   medroxyPROGESTERone (PROVERA) 10 MG tablet Take 1 tablet by mouth in the morning. From the 10th to the 20th of each month. 0000000   Erven Colla, MD   acetaminophen (TYLENOL) 500 MG tablet Take 500 mg by mouth every 6 hours as needed    Historical Provider, MD       Current medications:    Current Facility-Administered Medications   Medication Dose Route Frequency Provider Last Rate Last Admin   ??? 0.9 % sodium chloride infusion   IntraVENous Continuous Antonieta Pert, MD       ??? sodium chloride flush 0.9 % injection 5-40 mL  5-40 mL IntraVENous 2 times per day Antonieta Pert, MD       ??? sodium chloride flush 0.9 % injection 5-40 mL  5-40 mL IntraVENous PRN Antonieta Pert, MD       ??? 0.9 % sodium chloride infusion   IntraVENous PRN Antonieta Pert, MD           Allergies:    Allergies   Allergen Reactions   ??? Latex Itching, Rash and Other (See Comments)     Burning to the touch    ??? Latex Rash   ??? Cephalexin Anaphylaxis       Problem List:    Patient Active Problem List   Diagnosis Code   ??? Polysubstance (excluding opioids) dependence (HCC) F19.20    ??? Sepsis ([place]) A41.9   ??? Hyperchloremic metabolic acidosis 0000000   ??? Elevated LFTs R79.89   ??? Polysubstance abuse (HCC) F19.10   ??? IVDU (intravenous drug user) F19.90   ??? Pyomyositis M60.009   ??? 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 Abnormal glandular Papanicolaou smear of cervix R87.619   ??? Abnormal uterine bleeding N93.9   ??? Agoraphobia F40.00   ??? Anxiety and depression F41.9, F32.A   ??? 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 Bipolar disorder (HCC) F31.9   ??? Breast mass, right N63.10   ??? Cervicalgia M54.2   ??? Depressed bipolar I disorder in full remission ([place]) F31.76   ??? Endometriosis N80.9   ??? Migraine G43.909   ??? Mild dysplasia of cervix ([place] I) N87.0   ??? Normocytic anemia D64.9   ???  Opioid abuse ([place]) F11.10   ??? Other and unspecified ovarian cyst N83.209   ??? Panic attack F41.0   ??? Paresthesia of both feet R20.2   ??? Paresthesia of right arm R20.2   ??? Pituitary mass (HCC) E23.6   ??? Diabetes mellitus ([place]) E11.9   ??? Viral hepatitis C B19.20   ??? Pituitary tumor D49.7   ??? Leg pain M79.606   ??? Myositis M60.9   ??? Occipital neuralgia M54.81   ??? Sacroiliitis (HCC) M46.1       Past Medical History:        Diagnosis Date   ??? Anxiety    ??? 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 Bipolar affective disorder ([place])    ??? Cervical fusion syndrome    ??? Chronic pain    ??? Depression    ??? Gestational diabetes mellitus    ??? Hepatitis A 07/14/2018   ??? Hepatitis C 08/02/2017   ??? 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   ??? History of heroin abuse ([place])    ??? HPV in female    ??? Miscarriage    ??? 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 Neck pain    ??? OCD (obsessive compulsive disorder)    ??? Paranoia ([place])    ??? Tobacco abuse        Past Surgical History:        Procedure Laterality Date   ??? CERVICAL SPINE SURGERY      C1-C2 fusion 2003   ??? CESAREAN SECTION     ??? ECTOPIC PREGNANCY SURGERY         Social History:    Social History     Tobacco Use   ??? Smoking status: Former      Packs/day: 0.50     Years: 11.00     Pack years: 5.50     Types: Cigarettes   ??? Smokeless tobacco: Never   Substance Use Topics   ??? Alcohol use: Yes     Comment: every 2 months                                Counseling given: Not Answered      Vital Signs (Current):   Vitals:    06/25/21 0904   BP: 126/75   Pulse: 70   Resp: 16   SpO2: 97%   Weight: 141 lb 1.5 oz (64 kg)   Height: '5\' 5"'$  (1.651 m)                                              BP Readings from Last 3 Encounters:   06/25/21 126/75   06/15/21 130/87   06/13/21 138/81       NPO Status:                            >8hrs                                                       BMI:   Wt Readings from Last  3 Encounters:   06/25/21 141 lb 1.5 oz (64 kg)   06/15/21 135 lb (61.2 kg)   06/13/21 136 lb 7.4 oz (61.9 kg)     Body mass index is 23.48 kg/m??.    CBC:   Lab Results   Component Value Date/Time    WBC 5.0 06/13/2021 10:26 AM    RBC 4.35 06/13/2021 10:26 AM    HGB 13.8 06/13/2021 10:26 AM    HCT 40.9 06/13/2021 10:26 AM    MCV 93.9 06/13/2021 10:26 AM    RDW 13.5 06/13/2021 10:26 AM    PLT 151 06/13/2021 10:26 AM       CMP:   Lab Results   Component Value Date/Time    NA 139 06/13/2021 10:26 AM    K 4.3 06/13/2021 10:26 AM    K 4.0 04/27/2021 12:42 PM    CL 103 06/13/2021 10:26 AM    CO2 25 06/13/2021 10:26 AM    BUN 11 06/13/2021 10:26 AM    CREATININE <0.5 06/13/2021 10:26 AM    GFRAA >60 06/13/2021 10:26 AM    GFRAA >60 01/17/2010 11:00 PM    AGRATIO 1.8 12/21/2019 02:17 PM    LABGLOM >60 06/13/2021 10:26 AM    GLUCOSE 120 06/13/2021 10:26 AM    PROT 7.3 12/21/2019 02:17 PM    PROT 7.1 01/17/2010 11:00 PM    CALCIUM 9.0 06/13/2021 10:26 AM    BILITOT <0.2 12/21/2019 02:17 PM    ALKPHOS 75 12/21/2019 02:17 PM    AST 62 12/21/2019 02:17 PM    ALT 92 12/21/2019 02:17 PM       POC Tests: No results for input(s): POCGLU, POCNA, POCK, POCCL, POCBUN, POCHEMO, POCHCT in the last 72 hours.    Coags:   Lab Results   Component Value Date/Time    PROTIME 13.3  06/13/2021 10:26 AM    INR 1.02 06/13/2021 10:26 AM    APTT 26.1 06/13/2021 10:26 AM       HCG (If Applicable):   Lab Results   Component Value Date    PREGTESTUR Negative 07/14/2018        ABGs: No results found for: PHART, PO2ART, PCO2ART, HCO3ART, BEART, O2SATART     Type & Screen (If Applicable):  No results found for: LABABO, LABRH    Drug/Infectious Status (If Applicable):  No results found for: HIV, HEPCAB    COVID-19 Screening (If Applicable): No results found for: COVID19        Anesthesia Evaluation  Patient summary reviewed no history of anesthetic complications:   Airway: Mallampati: II  TM distance: >3 FB   Neck ROM: full  Mouth opening: > = 3 FB   Dental:    (+) poor dentition      Pulmonary: breath sounds clear to auscultation      (-) COPD, asthma, shortness of breath, recent URI and not a current smoker                           Cardiovascular:        (-) hypertension, valvular problems/murmurs, past MI, CAD, CABG/stent, dysrhythmias,  angina,  CHF, orthopnea and no pulmonary hypertension      Rhythm: regular  Rate: normal                    Neuro/Psych:   (+) neuromuscular disease:, headaches:, psychiatric history:depression/anxiety  ROS comment: States no illicit drugs since April    IVDA-heroin, cocaine  MJ GI/Hepatic/Renal:   (+) hepatitis: C, liver disease:,      (-) GERD and PUD       Endo/Other:    (+) : arthritis:., .    (-) diabetes mellitus (borderline--history of gestational DM), hypothyroidism, hyperthyroidism               Abdominal:             Vascular:          Other Findings:           Anesthesia Plan      general     ASA 3       Induction: intravenous.    MIPS: Postoperative opioids intended and Prophylactic antiemetics administered.  Anesthetic plan and risks discussed with patient and mother.      Plan discussed with CRNA.                This pre-anesthesia assessment may be used as a history and physical.    DOS STAFF ADDENDUM:    Pt seen and examined, chart reviewed  (including anesthesia, drug and allergy history).  No interval changes to history and physical examination.  Anesthetic plan, risks, benefits, alternatives, and personnel involved discussed with patient.  Patient verbalized an understanding and agrees to proceed.      Stann Mainland, MD  June 25, 2021  9:11 AM      Stann Mainland, MD   06/25/2021

## 2021-06-25 NOTE — Anesthesia Post-Procedure Evaluation (Signed)
Department of Anesthesiology  Postprocedure Note    Patient: [name] [name]  MRN: [id number]  Birthdate: May 08, 1981  Date of evaluation: 06/25/2021      Procedure Summary     Date: 06/25/21 Room / Location: [facility] OR 11 / [facility]    Anesthesia Start: 1036 Anesthesia Stop: 1137    Procedures:       DILATATION AND CURETTAGE HYSTEROSCOPY (Left)      OPERATIVE LAPAROSCOPY, DRAINAGE OF LEFT OVARIAN CYST (Left) Diagnosis:       Uterine bleeding      Cyst of left ovary      (UTERINE BLEEDING, LEFT OVARIAN CYST)    Surgeons: Erven Colla, MD Responsible Provider: Stann Mainland, MD    Anesthesia Type: general ASA Status: 3          Anesthesia Type: No value filed.    Aldrete Phase I: Aldrete Score: 8    Aldrete Phase II: Aldrete Score: 10      Anesthesia Post Evaluation    Patient location during evaluation: PACU  Patient participation: complete - patient participated  Level of consciousness: awake and alert  Airway patency: patent  Nausea & Vomiting: no nausea and no vomiting  Complications: no  Cardiovascular status: hemodynamically stable  Respiratory status: acceptable  Hydration status: stable

## 2021-06-25 NOTE — Progress Notes (Signed)
Pt arrived to PACU from OR. Pt asleep on 3l/nc. Oral airway d/c'd upon arrival. Abd soft. Dressings x3 C/D/I.

## 2021-06-25 NOTE — Discharge Instructions (Addendum)
1 No tampons, douching, or intercourse for 4 weeks.  2 You may go up and down steps.  3 You may take a shower or take a bath.  4 Limit heavy lifting, nothing heavier than the equivalent of a gallon of milk.  5 No driving while taking pain medicine.  6 Call the office for a follow up appointment.  7 You will need to be seen in the office within a week.

## 2021-06-25 NOTE — Progress Notes (Signed)
Vaginal pr                                            Vaginal Sweep Documentation     Vaginal prep sponge count performed by [facility] and RE Count correct. Bladder straight cathed pre-op per Dr. Era Bumpers, minimal clear yellow urine drained.  Vaginal sweep performed by Dr. Era Bumpers  at [id number]. No foreign objects or vaginal tears noted.

## 2021-06-25 NOTE — Procedures (Signed)
[place] [address]                                 PROCEDURE NOTE    PATIENT NAME: [name] [name], [name]               DOB:        May 13, 1981  MED REC NO:   [id number]                          ROOM:  ACCOUNT NO:   [id number]                           ADMIT DATE: 06/25/2021  PROVIDER:     Linton Ham. Era Bumpers, MD    DATE OF PROCEDURE:  06/25/2021    INDICATION:  The patient is a 40 year old female.  She is gravida 6,  para 4-0-2-4, presenting with a one-week history of left-sided pelvic  pain.  CT showed a 2 cm left ovarian cyst.  She was already scheduled  for a hysteroscope and D and C.  I recommended an operative laparoscopy  with drainage of ovarian cyst as well as a hysteroscope and D and C.  I  discussed the technique, the recovery and the risks.  They include, but  are not limited to bleeding, infection, damage to her internal organs  such as bladder and bowel and ureters with need for subsequent  reparative surgery if damage occurred.  The patient voiced understanding  and agreed to proceed with the surgery.    PREOPERATIVE DIAGNOSES:  Pelvic pain, left ovarian cyst.    POSTOPERATIVE DIAGNOSES:  Pelvic pain, left ovarian cyst plus bilateral  ovarian cysts.    PROCEDURE PERFORMED:  Hysteroscope and D and C, operative laparoscopy,  drainage of bilateral ovarian cysts.    ANESTHESIA:  General endotracheal anesthetic.    ESTIMATED BLOOD LOSS:  Less than 50 mL.    PATHOLOGY:  Endometrial curettings.    DRAINS:  None.    COMPLICATIONS:  None.    DISPOSITION:  Stable to recovery room.    PROCEDURE:  The patient was taken to the operating room and was prepped  and draped in a normal sterile fashion in dorsal lithotomy position.   Single-tooth tenaculum was used to grasp the anterior aspect of cervix.   Eder-Cohen cannula was placed through the cervix and then two were  connected.  From here, attention was turned to the patient's  abdomen.  A  5-mm infraumbilical incision was made with a scalpel.  The Optiview  trocar along with the camera then were placed visually confirming  intra-abdominal presence.  Carbon dioxide gas created pneumoperitoneum.   Pressures were never greater than 15 mmHg, approximately 3 L of CO2 were  used.  Second incision was made.  This was three fingerbreadths superior  to the pubic symphysis and 5 mm in size and a trocar was placed through  here under direct visualization.  Third incision was  made.  This was 8 cm from the midline.  It was over on the right side  and a 5-mm infraumbilical incision was made with a scalpel.  Trocar was  placed through here under direct visualization.  Upon entrance into the  abdomen, I could see there  was a small left ovarian cyst.  Using a  single puncture technique with a needle-tip probe, I was able to  puncture the left ovarian cyst and aspirated clear fluid and the cyst promptly  deflated.  The left tube was within normal limits.  Attention was turned  to the right adnexa.  I was able to visualize the patient's right ovary  and appeared as though there was also a cyst on that ovary.  Using the  needle-tip probe, I was able to puncture the cyst.  Clear fluid was able  to be aspirated and the cyst promptly deflated.  The remainder of the  abdominal and pelvic survey which was performed was within normal  limits.  Carbon dioxide gas was allowed to completely escape from the  patient's abdomen, after which time the trocars were removed under  direct visualization.  The incisions were reapproximated using  subcuticular stitches of 4-0 Vicryl.  Pressure dressings placed across  the incisions.  The Eder-Cohen cannula was removed.  The uterus was  sounded to 8 cm.  The cervix was dilated to 8 mm.  Hysteroscope was  inserted.  There was a lush endometrial lining seen.  Curettage was  performed of the endometrial cavity using the curette and all the  endometrial curettings were collected  and sent off to be evaluated by  pathologist.  Hysteroscope was reinserted.  There was a much less lush  endometrial lining seen.  All instruments then were removed from the  patient's pelvic organs.  She was taken out of lithotomy, allowed to  awaken from anesthesia, after which time she was transferred from the  operating room to the recovery room where she went in stable condition.   All sponge, lap and needles were correct x2.        Parke Poisson, MD    D: [id number] 11:33:46       T: 06/25/2021 11:36:44     [email or web address]  Job#: [id number]     Doc#: [id number]    CC:

## 2021-06-25 NOTE — H&P (Signed)
Date of Surgery Update:  [name] [name] was seen, history and physical examination reviewed, and patient examined by me today. There have been no significant clinical changes since the completion of the previous history and physical.    The risk, benefits, and alternatives of the proposed procedure have been explained to the patient (or appropriate guardian) and understanding verbalized. All questions answered. Patient wishes to proceed.    Electronically signed by: Betsey Holiday Dotsie Gillette, [id number] AM

## 2021-07-02 NOTE — Telephone Encounter (Signed)
From: Delray Alt  To: Dr. Silvano Bilis  Sent: 07/02/2021 9:53 AM EDT  Subject: Glucometer    Good morning I was messaging to see if the doctor would be able or willing to send a script for a glucometer over to my pharmacy I have not been able to locate the one that I have for a few months now if it's all possible I would appreciate it thank you and I hope all is well and have a good day

## 2021-07-03 MED ORDER — BLOOD GLUCOSE TEST VI STRP
ORAL_STRIP | 5 refills | Status: AC
Start: 2021-07-03 — End: ?

## 2021-07-03 MED ORDER — FREESTYLE FREEDOM KIT
PACK | Freq: Every day | 0 refills | Status: AC
Start: 2021-07-03 — End: ?

## 2021-07-03 MED ORDER — ONETOUCH ULTRASOFT LANCETS MISC
3 refills | Status: AC
Start: 2021-07-03 — End: ?

## 2021-08-05 ENCOUNTER — Encounter: Attending: Obstetrics & Gynecology | Primary: Family Medicine

## 2021-08-05 ENCOUNTER — Encounter: Attending: Family Medicine | Primary: Family Medicine

## 2021-08-12 ENCOUNTER — Ambulatory Visit
Admit: 2021-08-12 | Discharge: 2021-08-12 | Payer: PRIVATE HEALTH INSURANCE | Attending: Family Medicine | Primary: Family Medicine

## 2021-08-12 ENCOUNTER — Ambulatory Visit
Admit: 2021-08-12 | Discharge: 2021-08-12 | Payer: PRIVATE HEALTH INSURANCE | Attending: Obstetrics & Gynecology | Primary: Family Medicine

## 2021-08-12 DIAGNOSIS — Z9889 Other specified postprocedural states: Secondary | ICD-10-CM

## 2021-08-12 DIAGNOSIS — F410 Panic disorder [episodic paroxysmal anxiety] without agoraphobia: Secondary | ICD-10-CM

## 2021-08-12 LAB — POCT GLYCOSYLATED HEMOGLOBIN (HGB A1C): Hemoglobin A1C: 6.6 %

## 2021-08-12 MED ORDER — GABAPENTIN 100 MG PO CAPS
100 MG | ORAL_CAPSULE | Freq: Three times a day (TID) | ORAL | 0 refills | Status: DC
Start: 2021-08-12 — End: 2021-11-03

## 2021-08-12 MED ORDER — SERTRALINE HCL 50 MG PO TABS
50 MG | ORAL_TABLET | Freq: Every day | ORAL | 1 refills | Status: DC
Start: 2021-08-12 — End: 2021-11-03

## 2021-08-12 NOTE — Progress Notes (Signed)
Pts here as a f/u from recent hyst d and c and op lap with drainage of bilateral ovarian cysts.  Still having some pelvic discomfort.  Being evaluated by GI for IBS.  Af, vss  Abd- incisions intact and healed, nt  Assess:  normal post op state  Plan:  I discussed surgical findings and path.  She may want a hysterectomy.  I discussed that she should finish her GI evaluation for a GI source of the discomfort.  I wouldn't want her to have a surgery for pelvic pain and it turns out the pain is of GI origin and the surgery wouldn't help her condition.    20 min >50% of time was spent discussing findings, management, and treatment options.

## 2021-08-12 NOTE — Progress Notes (Signed)
08/12/2021     [name] [name] (DOB:  08/19/1981) is a 40 y.o. female, here for evaluation of the following medical concerns:    HPI    Today, the patient follows up for several chronic medical concerns.      DM II - A1C 5.8 not on medication at this time.      opioid addiction- toaking suboxone.     PTSD/OCE/panic disorder- would like to start medication, she understands the risk, no SI, no thoughts of hurting others.      Today, denied chest pain, sob,n ,v or diarrhea.      Review of Systems   Constitutional:  Negative for activity change, fatigue, fever and unexpected weight change.   HENT:  Negative for congestion, ear pain, rhinorrhea, sinus pressure and sore throat.    Eyes:  Negative for visual disturbance.   Respiratory:  Negative for cough, chest tightness, shortness of breath and wheezing.    Cardiovascular:  Negative for chest pain and leg swelling.   Gastrointestinal:  Negative for abdominal pain, diarrhea, nausea and vomiting.   Endocrine: Negative for cold intolerance, heat intolerance, polydipsia and polyphagia.   Musculoskeletal:  Negative for arthralgias.   Skin:  Negative for rash.   Neurological:  Negative for dizziness, numbness and headaches.   Psychiatric/Behavioral:  Positive for dysphoric mood. The patient is not nervous/anxious.      Prior to Visit Medications    Medication Sig Taking? Authorizing Provider   gabapentin (NEURONTIN) 100 MG capsule Take 1 capsule by mouth 3 times daily for 180 days. Intended supply: 90 days Yes Silvano Bilis, DO   sertraline (ZOLOFT) 50 MG tablet Take 1 tablet by mouth daily Yes Okley Magnussen C Ajla Mcgeachy, DO   glucose monitoring (FREESTYLE FREEDOM) kit 1 kit by Does not apply route daily Yes Natayla Cadenhead C Ira Busbin, DO   blood glucose monitor strips Test 1 times a day & as needed for symptoms of irregular blood glucose. Dispense sufficient amount for indicated testing frequency plus additional to accommodate PRN testing needs. Yes Silvano Bilis, DO   ONE TOUCH ULTRASOFT  LANCETS MISC Use to test sugar once daily. Yes Silvano Bilis, DO   ibuprofen (ADVIL;MOTRIN) 600 MG tablet Take 1 tablet by mouth every 8 hours as needed for Pain Yes Florestine Avers, PA-C   medroxyPROGESTERone (PROVERA) 10 MG tablet Take 1 tablet by mouth in the morning. From the 10th to the 20th of each month. Yes Erven Colla, MD   acetaminophen (TYLENOL) 500 MG tablet Take 500 mg by mouth every 6 hours as needed Yes Historical Provider, MD        Social History     Tobacco Use    Smoking status: Former     Packs/day: 0.50     Years: 11.00     Pack years: 5.50     Types: Cigarettes    Smokeless tobacco: Never   Substance Use Topics    Alcohol use: Yes     Comment: every 2 months        Vitals:    08/12/21 1421   BP: 101/62   Weight: 137 lb (62.1 kg)   Height: '5\' 5"'  (1.651 m)     Estimated body mass index is 22.8 kg/m?? as calculated from the following:    Height as of this encounter: '5\' 5"'  (1.651 m).    Weight as of this encounter: 137 lb (62.1 kg).    Physical Exam  Vitals reviewed.   Constitutional:  Appearance: She is well-developed.   HENT:      Head: Normocephalic and atraumatic.      Right Ear: External ear normal.      Left Ear: External ear normal.      Nose: Nose normal.   Cardiovascular:      Rate and Rhythm: Normal rate and regular rhythm.      Heart sounds: Normal heart sounds.   Pulmonary:      Effort: Pulmonary effort is normal.      Breath sounds: Normal breath sounds. No wheezing.   Abdominal:      General: Bowel sounds are normal.      Palpations: Abdomen is soft.      Tenderness: There is no abdominal tenderness.   Musculoskeletal:         General: Normal range of motion.   Skin:     General: Skin is warm and dry.   Neurological:      Mental Status: She is alert and oriented to person, place, and time.   Psychiatric:         Behavior: Behavior normal.       ASSESSMENT/PLAN:  1. Panic attack  Labs obtained  Medication provided  Discussed side effects of medication  Discussed signs and  symptoms for immediate evaluation in ER.  Answered questions.     2. Bipolar disorder, in partial remission, most recent episode depressed ([place])  Take medication as prescribed.   Discussed conservative treatment  Discussed signs and symptoms for immediate evaluation in the ER  RTC if no improved.       3. Type 2 diabetes mellitus without complication, without long-term current use of insulin ([place])  Patient will need to keep a log of his glucose readings and bring them to the office.   He needs to monitor his diet and exercise.   Attempt to lose weight.   Discussed proper eating habits.   Continue to take medication as prescribed.   Will obtain A1C at this visit.   Educated on signs and symptoms for immediate evaluation in the ER.    - POCT glycosylated hemoglobin (Hb A1C)    Return in about 4 weeks (around 09/09/2021).

## 2021-08-26 ENCOUNTER — Encounter: Payer: PRIVATE HEALTH INSURANCE | Attending: Family Medicine | Primary: Family Medicine

## 2021-08-27 ENCOUNTER — Encounter
Admit: 2021-08-27 | Discharge: 2021-08-27 | Payer: PRIVATE HEALTH INSURANCE | Attending: Family Medicine | Primary: Family Medicine

## 2021-08-27 DIAGNOSIS — 1 ERRONEOUS ENCOUNTER ICD10: Secondary | ICD-10-CM

## 2021-08-27 NOTE — Progress Notes (Signed)
This encounter was created in error - please disregard.

## 2021-08-28 ENCOUNTER — Telehealth
Admit: 2021-08-28 | Discharge: 2021-08-28 | Payer: PRIVATE HEALTH INSURANCE | Attending: Family Medicine | Primary: Family Medicine

## 2021-08-28 DIAGNOSIS — F419 Anxiety disorder, unspecified: Secondary | ICD-10-CM

## 2021-08-28 MED ORDER — HYDROXYZINE PAMOATE 25 MG PO CAPS
25 MG | ORAL_CAPSULE | Freq: Three times a day (TID) | ORAL | 0 refills | Status: AC | PRN
Start: 2021-08-28 — End: 2021-09-11

## 2021-08-28 NOTE — Progress Notes (Signed)
08/28/2021     [name] [name] (DOB:  [birth year]) is a 40 y.o. female, here for evaluation of the following medical concerns:    HPI    Today, the patient wanting to follow up    PTSD/OCE/panic disorder- would like to start medication, she understands the risk, no SI, no thoughts of hurting others.  Taking Zoloft believes this is helping but she has only bee on this for 2 weeks.  She is still have issues with panic attacks and not sleeping.      Today, denied chest pain, sob,n ,v or diarrhea.        Review of Systems   Constitutional:  Negative for activity change, fatigue, fever and unexpected weight change.   Eyes:  Negative for visual disturbance.   Respiratory:  Negative for cough and shortness of breath.    Cardiovascular:  Negative for chest pain and leg swelling.   Gastrointestinal:  Negative for abdominal pain, diarrhea, nausea and vomiting.   Musculoskeletal:  Negative for arthralgias.   Skin:  Negative for rash.   Psychiatric/Behavioral:  Positive for dysphoric mood. The patient is nervous/anxious.      Prior to Visit Medications    Medication Sig Taking? Authorizing Provider   hydrOXYzine pamoate (VISTARIL) 25 MG capsule Take 1 capsule by mouth 3 times daily as needed for Itching Yes Phoenix Dresser C Montford Barg, DO   gabapentin (NEURONTIN) 100 MG capsule Take 1 capsule by mouth 3 times daily for 180 days. Intended supply: 90 days Yes Silvano Bilis, DO   sertraline (ZOLOFT) 50 MG tablet Take 1 tablet by mouth daily Yes Jahki Witham C Anik Wesch, DO   glucose monitoring (FREESTYLE FREEDOM) kit 1 kit by Does not apply route daily Yes Jennafer Gladue C Chyler Creely, DO   blood glucose monitor strips Test 1 times a day & as needed for symptoms of irregular blood glucose. Dispense sufficient amount for indicated testing frequency plus additional to accommodate PRN testing needs. Yes Silvano Bilis, DO   ONE TOUCH ULTRASOFT LANCETS MISC Use to test sugar once daily. Yes Silvano Bilis, DO   ibuprofen (ADVIL;MOTRIN) 600 MG tablet Take 1 tablet  by mouth every 8 hours as needed for Pain Yes Florestine Avers, PA-C   medroxyPROGESTERone (PROVERA) 10 MG tablet Take 1 tablet by mouth in the morning. From the 10th to the 20th of each month. Yes Erven Colla, MD   acetaminophen (TYLENOL) 500 MG tablet Take 500 mg by mouth every 6 hours as needed Yes Historical Provider, MD        Social History     Tobacco Use    Smoking status: Former     Packs/day: 0.50     Years: 11.00     Pack years: 5.50     Types: Cigarettes    Smokeless tobacco: Never   Substance Use Topics    Alcohol use: Yes     Comment: every 2 months        There were no vitals filed for this visit.  Estimated body mass index is 22.8 kg/m?? as calculated from the following:    Height as of 08/12/21: '5\' 5"'  (1.651 m).    Weight as of 08/12/21: 137 lb (62.1 kg).    Physical Exam  Vitals and nursing note reviewed.   Constitutional:       Appearance: She is well-developed.   HENT:      Head: Normocephalic and atraumatic.      Right Ear: External ear normal.  Left Ear: External ear normal.   Cardiovascular:      Rate and Rhythm: Normal rate and regular rhythm.      Heart sounds: Normal heart sounds.   Pulmonary:      Effort: Pulmonary effort is normal.      Breath sounds: Normal breath sounds.   Abdominal:      General: Bowel sounds are normal.      Palpations: Abdomen is soft.   Musculoskeletal:      Cervical back: Normal range of motion and neck supple.   Skin:     General: Skin is warm and dry.   Neurological:      Mental Status: She is alert and oriented to person, place, and time.   Psychiatric:         Behavior: Behavior normal.       ASSESSMENT/PLAN:  1. Anxiety  Take medication as prescribed.   Continue with Zoloft  Discussed conservative treatment  Discussed signs and symptoms for immediate evaluation in the ER  RTC if no improved.       Return in about 3 months (around 11/28/2021).

## 2021-09-21 ENCOUNTER — Encounter: Payer: PRIVATE HEALTH INSURANCE | Primary: Family Medicine

## 2021-10-02 ENCOUNTER — Emergency Department: Admit: 2021-10-02 | Payer: PRIVATE HEALTH INSURANCE | Primary: Family Medicine

## 2021-10-02 ENCOUNTER — Inpatient Hospital Stay
Admit: 2021-10-02 | Discharge: 2021-10-03 | Disposition: A | Discharge status: Home / Self Care | Payer: PRIVATE HEALTH INSURANCE

## 2021-10-02 DIAGNOSIS — N390 Urinary tract infection, site not specified: Secondary | ICD-10-CM

## 2021-10-02 DIAGNOSIS — R319 Hematuria, unspecified: Secondary | ICD-10-CM

## 2021-10-02 LAB — COMPREHENSIVE METABOLIC PANEL W/ REFLEX TO MG FOR LOW K
ALT: 404 U/L — ABNORMAL HIGH (ref 10–40)
AST: 400 U/L — ABNORMAL HIGH (ref 15–37)
Albumin/Globulin Ratio: 1.3 (ref 1.1–2.2)
Albumin: 3.9 g/dL (ref 3.4–5.0)
Alkaline Phosphatase: 96 U/L (ref 40–129)
Anion Gap: 12 (ref 3–16)
BUN: 12 mg/dL (ref 7–20)
CO2: 22 mmol/L (ref 21–32)
Calcium: 9.6 mg/dL (ref 8.3–10.6)
Chloride: 102 mmol/L (ref 99–110)
Creatinine: <0.5 mg/dL — ABNORMAL LOW (ref 0.6–1.1)
Est, Glom Filt Rate: >60 (ref >60)
Glucose: 169 mg/dL — ABNORMAL HIGH (ref 70–99)
Potassium reflex Magnesium: 4.3 mmol/L (ref 3.5–5.1)
Sodium: 136 mmol/L (ref 136–145)
Total Bilirubin: <0.2 mg/dL (ref 0.0–1.0)
Total Protein: 6.9 g/dL (ref 6.4–8.2)

## 2021-10-02 LAB — CBC WITH AUTO DIFFERENTIAL
Basophils %: 0.9 %
Basophils Absolute: 0.1 10*3/uL (ref 0.0–0.2)
Eosinophils %: 0.8 %
Eosinophils Absolute: 0.1 10*3/uL (ref 0.0–0.6)
Hematocrit: 41.8 % (ref 36.0–48.0)
Hemoglobin: 13.6 g/dL (ref 12.0–16.0)
Lymphocytes %: 29.7 %
Lymphocytes Absolute: 1.9 10*3/uL (ref 1.0–5.1)
MCH: 30.7 pg (ref 26.0–34.0)
MCHC: 32.7 g/dL (ref 31.0–36.0)
MCV: 93.9 fL (ref 80.0–100.0)
MPV: 9.1 fL (ref 5.0–10.5)
Monocytes %: 8.2 %
Monocytes Absolute: 0.5 10*3/uL (ref 0.0–1.3)
Neutrophils %: 60.4 %
Neutrophils Absolute: 3.8 10*3/uL (ref 1.7–7.7)
Platelets: 177 10*3/uL (ref 135–450)
RBC: 4.45 M/uL (ref 4.00–5.20)
RDW: 12.8 % (ref 12.4–15.4)
WBC: 6.3 10*3/uL (ref 4.0–11.0)

## 2021-10-02 LAB — LIPASE: Lipase: 27 U/L (ref 13.0–60.0)

## 2021-10-02 MED ORDER — IOPAMIDOL 76 % IV SOLN
76 % | Freq: Once | INTRAVENOUS | Status: AC | PRN
Start: 2021-10-02 — End: 2021-10-02
  Administered 2021-10-02: 23:00:00 75 mL via INTRAVENOUS

## 2021-10-02 NOTE — Discharge Instructions (Addendum)
Follow-up with your primary care physician in 1 week to make sure urine clears up  Take prescribed medication as prescribed only  Take OTC Tylenol as needed for pain or Motrin  Follow-up with Dr. Era Bumpers as needed

## 2021-10-02 NOTE — ED Notes (Signed)
Notified PA that pt is requesting pain and nausea medication      Sharin Mons, RN  10/02/21 1902

## 2021-10-02 NOTE — ED Provider Notes (Signed)
**ADVANCED PRACTICE PROVIDER, I HAVE EVALUATED THIS PATIENT**        [place]      Pt Name: [name] [name]  PJK:[id number]  [place] 01-28-1981  Date of evaluation: 10/02/2021  Provider: Florestine Avers, PA-C  Note Started: 7:32 PM EST 10/02/2021        Chief Complaint:    Chief Complaint   Patient presents with    Abdominal Pain     Lower abd worsened over the past three days, pain increased with movement , +  nausea no vomiting and or diarrhea          Nursing Notes, Past Medical Hx, Past Surgical Hx, Social Hx, Allergies, and Family Hx were all reviewed and agreed with or any disagreements were addressed in the HPI.    HPI: (Location, Duration, Timing, Severity, Quality, Assoc Sx, Context, Modifying factors)    Chief Complaint of lower left side abdominal pain.  Patient states started about 3 days ago.  She denies diarrhea.  No constipation, denies fever.  No vaginal pain or vaginal discharge, no vaginal bleeding.  She states she has had D&C recently with Dr. Era Bumpers.  Also drained a hemorrhagic cyst on her left ovary.  No other complaints.  Denies fever.    This is a  40 y.o. female who presents to the emergency room with the above complaint.    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    Neck pain     OCD (obsessive compulsive disorder)     Paranoia ([place])     Tobacco abuse          Procedure Laterality Date    CERVICAL SPINE SURGERY      C1-C2 fusion 2003    CESAREAN SECTION      DILATION AND CURETTAGE OF UTERUS Left 06/25/2021    DILATATION AND  CURETTAGE HYSTEROSCOPY performed by Erven Colla, MD at [facility] Left 06/25/2021    OPERATIVE LAPAROSCOPY, DRAINAGE OF LEFT OVARIAN CYST performed by Erven Colla, MD at [facility]       Medications:  Previous Medications    ACETAMINOPHEN (TYLENOL) 500 MG TABLET    Take 500 mg by mouth every 6 hours as needed    BLOOD GLUCOSE MONITOR STRIPS    Test 1 times a day & as needed for symptoms of irregular blood glucose. Dispense sufficient amount for indicated testing frequency plus additional to accommodate PRN testing needs.    GABAPENTIN (NEURONTIN) 100 MG CAPSULE    Take 1 capsule by mouth 3 times daily for 180 days. Intended supply: 90 days    GLUCOSE MONITORING (FREESTYLE FREEDOM) KIT    1 kit by Does not apply route daily    IBUPROFEN (ADVIL;MOTRIN) 600 MG TABLET  Take 1 tablet by mouth every 8 hours as needed for Pain    MEDROXYPROGESTERONE (PROVERA) 10 MG TABLET    Take 1 tablet by mouth in the morning. From the 10th to the 20th of each month.    ONE TOUCH ULTRASOFT LANCETS MISC    Use to test sugar once daily.    SERTRALINE (ZOLOFT) 50 MG TABLET    Take 1 tablet by mouth daily         Review of Systems:  (2-9 systems needed)  Review of Systems   Constitutional:  Negative for chills and fever.   HENT:  Negative for congestion and sore throat.    Eyes:  Negative for pain and visual disturbance.   Respiratory:  Negative for cough and shortness of breath.    Cardiovascular:  Negative for chest pain and leg swelling.   Gastrointestinal:  Positive for abdominal pain. Negative for nausea and vomiting.   Genitourinary:  Negative for dysuria, frequency, hematuria, pelvic pain, vaginal bleeding, vaginal discharge and vaginal pain.   Musculoskeletal:  Negative for back pain and neck pain.   Skin:  Negative for rash and wound.   Neurological:  Negative for dizziness and light-headedness.     "Positives and Pertinent negatives as per HPI"    Physical Exam:  Physical  Exam  Vitals and nursing note reviewed.   Constitutional:       Appearance: She is well-developed. She is not diaphoretic.   HENT:      Head: Normocephalic and atraumatic.      Nose: Nose normal.      Mouth/Throat:      Mouth: Mucous membranes are moist.      Pharynx: Oropharynx is clear. No oropharyngeal exudate or posterior oropharyngeal erythema.   Eyes:      General:         Right eye: No discharge.         Left eye: No discharge.   Cardiovascular:      Rate and Rhythm: Normal rate and regular rhythm.      Heart sounds: Normal heart sounds. No murmur heard.    No friction rub. No gallop.   Pulmonary:      Effort: Pulmonary effort is normal. No respiratory distress.      Breath sounds: Normal breath sounds. No wheezing or rales.   Chest:      Chest wall: No tenderness.   Abdominal:      General: Abdomen is flat. Bowel sounds are normal. There is no distension.      Palpations: Abdomen is soft. There is no mass.      Tenderness: There is abdominal tenderness in the suprapubic area. There is no guarding or rebound.   Musculoskeletal:         General: Normal range of motion.      Cervical back: Normal range of motion and neck supple.   Skin:     General: Skin is warm and dry.   Neurological:      General: No focal deficit present.      Mental Status: She is alert and oriented to person, place, and time.   Psychiatric:         Behavior: Behavior normal.       MEDICAL DECISION MAKING    Vitals:    Vitals:    10/02/21 1705   BP: 124/78   Pulse: 74   Resp: 18   Temp: 97.6 ??F (36.4 ??C)   TempSrc: Oral  SpO2: 98%   Weight: 138 lb 14.2 oz (63 kg)   Height: '5\' 5"'  (1.651 m)       LABS:  Labs Reviewed   COMPREHENSIVE METABOLIC PANEL W/ REFLEX TO MG FOR LOW K - Abnormal; Notable for the following components:       Result Value    Glucose 169 (*)     Creatinine <0.5 (*)     ALT 404 (*)     AST 400 (*)     All other components within normal limits   URINALYSIS WITH REFLEX TO CULTURE - Abnormal; Notable for the following  components:    Clarity, UA CLOUDY (*)     Blood, Urine TRACE (*)     Nitrite, Urine POSITIVE (*)     Leukocyte Esterase, Urine MODERATE (*)     All other components within normal limits   MICROSCOPIC URINALYSIS - Abnormal; Notable for the following components:    Bacteria, UA 4+ (*)     WBC, UA 66 (*)     Epithelial Cells, UA 12 (*)     All other components within normal limits   CULTURE, URINE   CBC WITH AUTO DIFFERENTIAL   LIPASE   PREGNANCY, URINE        Remainder of labs reviewed and were negative at this time or not returned at the time of this note.    RADIOLOGY:   Non-plain film images such as CT, Ultrasound and MRI are read by the radiologist. I, Florestine Avers, PA-C have directly visualized the radiologic plain film image(s) with the below findings:      Interpretation per the Radiologist below, if available at the time of this note:    CT ABDOMEN PELVIS W IV CONTRAST Additional Contrast? None   Final Result   No definite acute abnormality identified.      Moderate amount of stool within the colon.  Please correlate with clinical   evidence of constipation.              CT ABDOMEN PELVIS W IV CONTRAST Additional Contrast? None    Result Date: 10/02/2021  EXAMINATION: CT OF THE ABDOMEN AND PELVIS WITH CONTRAST 10/02/2021 3:08 pm TECHNIQUE: CT of the abdomen and pelvis was performed with the administration of intravenous contrast. Multiplanar reformatted images are provided for review. Automated exposure control, iterative reconstruction, and/or weight based adjustment of the mA/kV was utilized to reduce the radiation dose to as low as reasonably achievable. COMPARISON: 06/13/2021 the undo that HISTORY: ORDERING SYSTEM PROVIDED HISTORY: Left lower quadrant pain.  History of hemorrhagic left ovarian cyst TECHNOLOGIST PROVIDED HISTORY: Additional Contrast?->None Reason for exam:->Left lower quadrant pain.  History of hemorrhagic left ovarian cyst Decision Support Exception - unselect if not a suspected or  confirmed emergency medical condition->Emergency Medical Condition (MA) Reason for Exam: Left lower quadrant pain. History of hemorrhagic left ovarian cyst FINDINGS: Lower Chest: The visualized lungs are clear. Base of the heart is unremarkable. Visualized extra thoracic soft tissues are unremarkable. Organs: Liver enhances normally.  The portal vein is patent.  Gallbladder is unremarkable. Spleen enhances normally.  Adrenals are unremarkable.  Pancreas unremarkable. No acute renal abnormalities are identified. GI/Bowel: Distal esophagus and stomach are unremarkable.  Duodenal sweep and the remainder of the small bowel are unremarkable. Moderate amount of stool is noted in the large bowel.  Large bowel is otherwise unremarkable.  Appendix is normal. Pelvis: Uterus and adnexa regions are unremarkable for the patient's age. Small amount of free pelvic  fluid is noted.  Urinary bladder is unremarkable. Peritoneum/Retroperitoneum: Abdominal aorta normal in caliber.  Superior mesenteric artery is enhancing.  No lymphadenopathy is identified. Bones/Soft Tissues: No acute or suspicious bony abnormalities are identified. The extra-abdominal and extra pelvic soft tissues are unremarkable.     No definite acute abnormality identified. Moderate amount of stool within the colon.  Please correlate with clinical evidence of constipation.          MEDICAL DECISION MAKING / ED COURSE:      PROCEDURES:   Procedures    None    Patient was given:  Medications   iopamidol (ISOVUE-370) 76 % injection 75 mL (75 mLs IntraVENous Given 10/02/21 1814)   ondansetron (ZOFRAN) injection 4 mg (4 mg IntraVENous Given 10/02/21 1915)   morphine (PF) injection 2 mg (2 mg IntraVENous Given 10/02/21 1915)     Emergency room course: Patient on exam cardiovascular regular rhythm, lungs are clear.  No wheeze, rales or rhonchi noted.  Abdomen is soft bowel left lower quadrant, suprapubic and right lower quadrant tenderness with palpation.  No CVA or flank  tenderness.  No rebound or guarding noted.  No palpable mass.  Full range of motion all extremity.  Bilateral lower extremities show no edema.  She is alert oriented x4.  Does not appear to be in acute distress.    Lab result from today shows:  CBC within normal limits her white count is 6.3.  CMP shows elevated ALT of 404 and AST 400.    Urinalysis shows moderate leukocytes, positive for nitrites, trace blood, negative for ketones.  Microscopically bacteria is 4+, WBC of 66, RBC of 1 epithelial cells of 12.    Urine negative for pregnancy.  Lipase 27.0.    CT scan abdomen and pelvis IV contrast shows no acute abnormality of the abdomen or pelvis.  Does show moderate amount of stool in the colon.  Patient does not complain of constipation.  And normal bowel movement.    I discussed patient CT results and lab result from today with her.  She will be treated for UTI.  She will be placed on Ceftin.  Follow-up primary care physician to have urine repeated or Dr. Era Bumpers to have urine repeated in a week to make sure this clears up.  I will give prescription for MiraLAX stool softener for constipation.  Return for any worsening.  She will be discharged stable condition.    The patient tolerated their visit well.  I evaluated the patient.  The physician was available for consultation as needed.  The patient and / or the family were informed of the results of any tests, a time was given to answer questions, a plan was proposed and they agreed with plan.     I am the Primary Clinician of Record.     CLINICAL IMPRESSION:  1. Urinary tract infection with hematuria, site unspecified    2. Constipation, unspecified constipation type        DISPOSITION Decision To Discharge 10/02/2021 07:39:45 PM      PATIENT REFERRED TO:  Silvano Bilis, DO  [address] [place]  [place] [address]  [phone]    Call in 1 week  To have urine rechecked to make sure UTI clears up    DISCHARGE MEDICATIONS:  New Prescriptions     POLYETHYLENE GLYCOL (GLYCOLAX) 17 GM/SCOOP POWDER    Take 17 g by mouth daily    SULFAMETHOXAZOLE-TRIMETHOPRIM (BACTRIM DS) 800-160 MG PER  TABLET    Take 1 tablet by mouth 2 times daily for 5 days       DISCONTINUED MEDICATIONS:  Discontinued Medications    No medications on file              (Please note the MDM and HPI sections of this note were completed with a voice recognition program.  Efforts were made to edit the dictations but occasionally words are mis-transcribed.)    Electronically signed, Florestine Avers, PA-C,          Florestine Avers, PA-C  10/02/21 1943

## 2021-10-02 NOTE — Other (Signed)
Patient's positive result has been appropriately evaluated by the provider pool.     Patient's PCP office contacted patient

## 2021-10-03 LAB — PREGNANCY, URINE: Pregnancy, Urine: NEGATIVE

## 2021-10-03 LAB — URINALYSIS WITH REFLEX TO CULTURE
Bilirubin Urine: NEGATIVE
Glucose, Ur: NEGATIVE mg/dL
Ketones, Urine: NEGATIVE mg/dL
Nitrite, Urine: POSITIVE — AB
Protein, UA: NEGATIVE mg/dL
Specific Gravity, UA: 1.044 (ref 1.005–1.030)
Urobilinogen, Urine: 0.2 E.U./dL (ref <2.0)
pH, UA: 7 (ref 5.0–8.0)

## 2021-10-03 LAB — MICROSCOPIC URINALYSIS
Epithelial Cells, UA: 12 /HPF — ABNORMAL HIGH (ref 0–5)
Hyaline Casts, UA: 0 /LPF (ref 0–8)
RBC, UA: 1 /HPF (ref 0–4)
WBC, UA: 66 /HPF — ABNORMAL HIGH (ref 0–5)

## 2021-10-03 MED ORDER — POLYETHYLENE GLYCOL 3350 17 GM/SCOOP PO POWD
17 GM/SCOOP | Freq: Every day | ORAL | 1 refills | Status: AC
Start: 2021-10-03 — End: 2021-11-01

## 2021-10-03 MED ORDER — MORPHINE SULFATE (PF) 2 MG/ML IV SOLN
2 MG/ML | Freq: Once | INTRAVENOUS | Status: AC
Start: 2021-10-03 — End: 2021-10-02
  Administered 2021-10-03: 2 mg via INTRAVENOUS

## 2021-10-03 MED ORDER — SULFAMETHOXAZOLE-TRIMETHOPRIM 800-160 MG PO TABS
800-160 MG | ORAL_TABLET | Freq: Two times a day (BID) | ORAL | 0 refills | Status: DC
Start: 2021-10-03 — End: 2021-10-06

## 2021-10-03 MED ORDER — ONDANSETRON HCL 4 MG/2ML IJ SOLN
4 MG/2ML | Freq: Once | INTRAMUSCULAR | Status: AC
Start: 2021-10-03 — End: 2021-10-02
  Administered 2021-10-03: 4 mg via INTRAVENOUS

## 2021-10-03 MED FILL — MORPHINE SULFATE 2 MG/ML IJ SOLN: 2 mg/mL | INTRAMUSCULAR | Qty: 1

## 2021-10-03 MED FILL — ONDANSETRON HCL 4 MG/2ML IJ SOLN: 4 MG/2ML | INTRAMUSCULAR | Qty: 2

## 2021-10-05 LAB — CULTURE, URINE: Urine Culture, Routine: >100000

## 2021-10-06 ENCOUNTER — Inpatient Hospital Stay
Admit: 2021-10-06 | Discharge: 2021-10-07 | Disposition: A | Discharge status: Home / Self Care | Payer: PRIVATE HEALTH INSURANCE

## 2021-10-06 DIAGNOSIS — N939 Abnormal uterine and vaginal bleeding, unspecified: Secondary | ICD-10-CM

## 2021-10-06 MED ORDER — IBUPROFEN 400 MG PO TABS
400 MG | Freq: Once | ORAL | Status: AC
Start: 2021-10-06 — End: 2021-10-06
  Administered 2021-10-06: 600 mg via ORAL

## 2021-10-06 MED FILL — IBUPROFEN 200 MG PO TABS: 200 MG | ORAL | Qty: 1

## 2021-10-06 NOTE — Discharge Instructions (Signed)
Home in good condition to drink plenty of water, use medications as written, and monitor for gradual improvement.  Call your OB/GYN tomorrow to arrange further care and treatment as discussed.  Return to the ER for any emergency worsening or concern

## 2021-10-06 NOTE — ED Provider Notes (Signed)
**ADVANCED PRACTICE PROVIDER, I HAVE EVALUATED THIS PATIENT**        [place]      Pt Name: [name] [name]  VZD:[id number]  [place] 12-20-1980  Date of evaluation: 10/06/2021  Provider: Phillis Haggis, PA-C  Note Started: 6:54 PM EST 10/06/2021        Chief Complaint:    Chief Complaint   Patient presents with    Vaginal Bleeding     Bleeding since 12/3, going through mulitple pads. Has been seeing Dr. Era Bumpers for this issue          Nursing Notes, Past Medical Hx, Past Surgical Hx, Social Hx, Allergies, and Family Hx were all reviewed and agreed with or any disagreements were addressed in the HPI.    HPI: (Location, Duration, Timing, Severity, Quality, Assoc Sx, Context, Modifying factors)    Chief Complaint of heavy vaginal bleeding for the last 3 days    This is a  40 y.o. female who presents stating that she has seen her OB/GYN for this before.  Bleeding has been pretty heavy over these last few days and especially at night when it is hard to contain and requires a few different pads.  She had a D&C done a couple of months ago that presumably was in a healthy issue but appears maybe it has not.  Patient states that she does not have any worsening pain or tenderness than usual.  She does have some ongoing pelvic cramping not acutely changed or different today and did just have blood work and a CT abdomen pelvis a few days ago in the ER.  She has been using her antibiotic for the UTI that was diagnosed a few days ago.   No fevers at home.  She does feel she has been having some congestion especially in the ears and a bit of sore throat as well.  No cough or shortness of breath or chest pain.  No difficulty eating or drinking.  No extremity acute weakness or loss of range of motion or strength.  No rash.  No syncope or near syncope.  No difficulty ambulating. No weakness or extremity acute changes.  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    Neck pain     OCD (obsessive compulsive disorder)     Paranoia ([place])     Tobacco abuse          Procedure Laterality Date    CERVICAL SPINE SURGERY      C1-C2 fusion 2003    CESAREAN SECTION      DILATION AND CURETTAGE OF UTERUS Left 06/25/2021    DILATATION AND CURETTAGE HYSTEROSCOPY performed by Erven Colla, MD at [facility] Left 06/25/2021    OPERATIVE LAPAROSCOPY, DRAINAGE OF LEFT  OVARIAN CYST performed by Erven Colla, MD at [facility]       Medications:  Previous Medications    ACETAMINOPHEN (TYLENOL) 500 MG TABLET    Take 500 mg by mouth every 6 hours as needed    BLOOD GLUCOSE MONITOR STRIPS    Test 1 times a day & as needed for symptoms of irregular blood glucose. Dispense sufficient amount for indicated testing frequency plus additional to accommodate PRN testing needs.    GABAPENTIN (NEURONTIN) 100 MG CAPSULE    Take 1 capsule by mouth 3 times daily for 180 days. Intended supply: 90 days    GLUCOSE MONITORING (FREESTYLE FREEDOM) KIT    1 kit by Does not apply route daily    IBUPROFEN (ADVIL;MOTRIN) 600 MG TABLET    Take 1 tablet by mouth every 8 hours as needed for Pain    MEDROXYPROGESTERONE (PROVERA) 10 MG TABLET    Take 1 tablet by mouth in the morning. From the 10th to the 20th of each month.    ONE TOUCH ULTRASOFT LANCETS MISC    Use to test sugar once daily.    POLYETHYLENE GLYCOL (GLYCOLAX) 17 GM/SCOOP POWDER    Take 17 g by mouth daily    SERTRALINE (ZOLOFT) 50 MG TABLET    Take 1 tablet by mouth daily    SULFAMETHOXAZOLE-TRIMETHOPRIM (BACTRIM  DS) 800-160 MG PER TABLET    Take 1 tablet by mouth 2 times daily for 5 days         Review of Systems:  (2-9 systems needed)  Review of Systems  Positive history as above, patient indicates she is eating and drinking okay, no fevers or chills, no headache or vision change but positive for ear pressure and popping and mild sore throat on and off.  No shortness of breath or chest pain syncope or near syncope or extremity acute weakness.  "Positives and Pertinent negatives as per HPI"    Physical Exam:  Physical Exam  Vitals and nursing note reviewed.   Constitutional:       Appearance: Normal appearance. She is not diaphoretic.   HENT:      Head: Normocephalic and atraumatic.      Right Ear: External ear normal.      Left Ear: External ear normal.      Ears:      Comments: Bilateral TM clear effusions worse on left than right     Nose: Nose normal.      Mouth/Throat:      Mouth: Mucous membranes are moist.      Pharynx: No posterior oropharyngeal erythema.   Eyes:      General:         Right eye: No discharge.         Left eye: No discharge.      Conjunctiva/sclera: Conjunctivae normal.   Cardiovascular:      Rate and Rhythm: Normal rate and regular rhythm.      Pulses: Normal pulses.      Heart sounds: Normal heart sounds. No murmur heard.    No gallop.   Pulmonary:      Effort: Pulmonary effort is normal. No respiratory distress.      Breath sounds: Normal breath sounds. No wheezing, rhonchi or rales.   Abdominal:      Tenderness: There is no right CVA tenderness or left CVA tenderness.   Musculoskeletal:         General: Normal range of motion.  Cervical back: Normal range of motion. No rigidity.      Right lower leg: No edema.      Left lower leg: No edema.   Lymphadenopathy:      Cervical: No cervical adenopathy.   Skin:     General: Skin is warm and dry.      Capillary Refill: Capillary refill takes less than 2 seconds.   Neurological:      Mental Status: She is alert and oriented to person, place, and  time. Mental status is at baseline.      Sensory: No sensory deficit.      Motor: No weakness.      Coordination: Coordination normal.      Gait: Gait normal.   Psychiatric:         Mood and Affect: Mood normal.         Behavior: Behavior normal.       MEDICAL DECISION MAKING    Vitals:    Vitals:    10/06/21 1742   BP: 138/84   Pulse: 76   Resp: 17   Temp: 98.6 ??F (37 ??C)   TempSrc: Oral   SpO2: 98%   Weight: 140 lb 10.5 oz (63.8 kg)   Height: '5\' 5"'  (1.651 m)       LABS:  Labs Reviewed   HEMOGLOBIN AND HEMATOCRIT        Remainder of labs reviewed and were negative at this time or not returned at the time of this note.    RADIOLOGY:   Non-plain film images such as CT, Ultrasound and MRI are read by the radiologist. I, Phillis Haggis, PA-C have directly visualized the radiologic plain film image(s) with the below findings:      Interpretation per the Radiologist below, if available at the time of this note:    No orders to display        [place] Additional Contrast? None    Result Date: 10/02/2021  EXAMINATION: CT OF THE ABDOMEN AND PELVIS WITH CONTRAST 10/02/2021 3:08 pm TECHNIQUE: CT of the abdomen and pelvis was performed with the administration of intravenous contrast. Multiplanar reformatted images are provided for review. Automated exposure control, iterative reconstruction, and/or weight based adjustment of the mA/kV was utilized to reduce the radiation dose to as low as reasonably achievable. COMPARISON: 06/13/2021 the undo that HISTORY: ORDERING SYSTEM PROVIDED HISTORY: Left lower quadrant pain.  History of hemorrhagic left ovarian cyst TECHNOLOGIST PROVIDED HISTORY: Additional Contrast?->None Reason for exam:->Left lower quadrant pain.  History of hemorrhagic left ovarian cyst Decision Support Exception - unselect if not a suspected or confirmed emergency medical condition->Emergency Medical Condition (MA) Reason for Exam: Left lower quadrant pain. History of hemorrhagic left ovarian  cyst FINDINGS: Lower Chest: The visualized lungs are clear. Base of the heart is unremarkable. Visualized extra thoracic soft tissues are unremarkable. Organs: Liver enhances normally.  The portal vein is patent.  Gallbladder is unremarkable. Spleen enhances normally.  Adrenals are unremarkable.  Pancreas unremarkable. No acute renal abnormalities are identified. GI/Bowel: Distal esophagus and stomach are unremarkable.  Duodenal sweep and the remainder of the small bowel are unremarkable. Moderate amount of stool is noted in the large bowel.  Large bowel is otherwise unremarkable.  Appendix is normal. Pelvis: Uterus and adnexa regions are unremarkable for the patient's age. Small amount of free pelvic fluid is noted.  Urinary bladder is unremarkable. Peritoneum/Retroperitoneum: Abdominal aorta normal in caliber.  Superior mesenteric artery is enhancing.  No lymphadenopathy is identified. Bones/Soft Tissues: No acute or suspicious bony abnormalities are identified. The extra-abdominal and extra pelvic soft tissues are unremarkable.     No definite acute abnormality identified. Moderate amount of stool within the colon.  Please correlate with clinical evidence of constipation.          MEDICAL DECISION MAKING / ED COURSE:      PROCEDURES:   Procedures    None    Patient was given:  Medications   ibuprofen (ADVIL;MOTRIN) tablet 600 mg (600 mg Oral Given 10/06/21 1851)       Patient presents as above and evaluation and treatment is begun here.  She does have history of heavy menstrual cycles before and has had a D&C a couple of months ago.  She has had to have a period again for the last few days.  However vital signs stable and patient with good ambulation, no syncope or near syncope or other acute worrisome signs.  H&H ordered.  These are stable for the patient.  She is given some ibuprofen to help increase prostaglandins and help slow down the bleeding.  Additionally we will write for some Flonase to help out with the  nasal congestion.  Thirdly it is noted in the lab record that patient's urine grew out bacteria not sensitive to Bactrim however they would be sensitive to either Ceftin or nitrofurantoin.  Patient states that in spite of her chart that says she has a Keflex allergy she really does not and did tolerate that well in the past.  She would be able to take the Ceftin she is certain as well.  No other indication of acute system compromise and conservative home care now discussed, recommended to patient, and agreed upon with her as follows  Home in good condition to drink plenty of water, use medications as written, and monitor for gradual improvement.  Call your OB/GYN tomorrow to arrange further care and treatment as discussed.  Return to the ER for any emergency worsening or concern  The patient tolerated their visit well.  I evaluated the patient.  The physician was available for consultation as needed.  The patient and / or the family were informed of the results of any tests, a time was given to answer questions, a plan was proposed and they agreed with plan.     I am the Primary Clinician of Record.     CLINICAL IMPRESSION:  1. Abnormal vaginal bleeding    2. Sinus congestion    3. 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    DISCHARGE MEDICATIONS:  New Prescriptions    No medications on file       DISCONTINUED MEDICATIONS:  Discontinued Medications    No medications on file              (Please note the MDM and HPI sections of this note were completed with a voice recognition program.  Efforts were made to edit the dictations but occasionally words are mis-transcribed.)    Electronically signed, Azadeh Hyder Cipriano Mile, PA-C,          [place], PA-C  10/06/21 2100

## 2021-10-07 LAB — HEMOGLOBIN AND HEMATOCRIT
Hematocrit: 40.3 % (ref 36.0–48.0)
Hemoglobin: 13.4 g/dL (ref 12.0–16.0)

## 2021-10-07 MED ORDER — CEFUROXIME AXETIL 250 MG PO TABS
250 MG | ORAL_TABLET | Freq: Two times a day (BID) | ORAL | 0 refills | Status: AC
Start: 2021-10-07 — End: 2021-10-13

## 2021-10-07 MED ORDER — FLUTICASONE PROPIONATE 50 MCG/ACT NA SUSP
50 MCG/ACT | Freq: Every day | NASAL | 0 refills | Status: DC
Start: 2021-10-07 — End: 2023-01-18

## 2021-10-07 MED ORDER — IBUPROFEN 600 MG PO TABS
600 MG | ORAL_TABLET | Freq: Three times a day (TID) | ORAL | 0 refills | Status: DC
Start: 2021-10-07 — End: 2022-12-28

## 2021-10-09 ENCOUNTER — Ambulatory Visit
Admit: 2021-10-09 | Discharge: 2021-10-09 | Payer: PRIVATE HEALTH INSURANCE | Attending: Obstetrics & Gynecology | Primary: Family Medicine

## 2021-10-09 DIAGNOSIS — N939 Abnormal uterine and vaginal bleeding, unspecified: Secondary | ICD-10-CM

## 2021-10-09 NOTE — Progress Notes (Signed)
Pt is here as a f/u from recent ER visits.  She notes continued irreg bleeding several months after having the hyst d and c.  She notes that her bleeding may last up to a week.  Hb was 13.  I reviewed pt's chart.  She had a similar concern in July when I had seen her and had placed her on cyclic Provera each month with 11 refills to control her period.  I asked how she was doing on the regimen we had discussed.  She said she hasn't taken the medicine for several months.  I offered to let her continue with that regimen or surgery.  I briefly discussed a hysterectomy.  She said she would like to try the cyclic Provera again.  All questions answered.  20 min >50% of time was spent discussing findings, management, and treatment options.

## 2021-11-03 MED ORDER — GABAPENTIN 100 MG PO CAPS
100 MG | ORAL_CAPSULE | ORAL | 2 refills | Status: DC
Start: 2021-11-03 — End: 2021-11-25

## 2021-11-03 MED ORDER — SERTRALINE HCL 50 MG PO TABS
50 MG | ORAL_TABLET | ORAL | 1 refills | Status: DC
Start: 2021-11-03 — End: 2021-11-25

## 2021-11-03 NOTE — Telephone Encounter (Signed)
Last OV: 08/28/2021    No future appts      : SERTRALINE HCL 50 MG TABLET          Will file in chart as: sertraline (ZOLOFT) 50 MG tablet    Sig: TAKE ONE TABLET BY MOUTH DAILY    Disp:  30 tablet    Refills:  1 (Pharmacy requested: Not specified)    Start: 10/31/2021    Class: Normal    Non-formulary    Last ordered: 2 months ago by Silvano Bilis, DO Last refill: 10/03/2021    Rx #: [id number]    SS[facility] Protocol Passed 10/31/2021 10:14 AM    Visit with authorizing provider in past 9 months or upcoming 90 days    No positive pregnancy test or active pregnancy on record in past 12 months       Name from pharmacy: GABAPENTIN 100 MG CAPSULE         Will file in chart as: gabapentin (NEURONTIN) 100 MG capsule    Sig: TAKE ONE CAPSULE BY MOUTH THREE TIMES A DAY FOR 180 DAYS    Disp:  90 capsule    Refills:  Not specified    Start: 10/31/2021

## 2021-11-25 ENCOUNTER — Telehealth
Admit: 2021-11-25 | Discharge: 2021-11-25 | Payer: PRIVATE HEALTH INSURANCE | Attending: Family Medicine | Primary: Family Medicine

## 2021-11-25 DIAGNOSIS — F410 Panic disorder [episodic paroxysmal anxiety] without agoraphobia: Secondary | ICD-10-CM

## 2021-11-25 MED ORDER — GABAPENTIN 300 MG PO CAPS
300 MG | ORAL_CAPSULE | Freq: Two times a day (BID) | ORAL | 1 refills | Status: AC
Start: 2021-11-25 — End: 2022-05-24

## 2021-11-25 MED ORDER — SERTRALINE HCL 100 MG PO TABS
100 MG | ORAL_TABLET | Freq: Every day | ORAL | 3 refills | Status: AC
Start: 2021-11-25 — End: 2022-06-17

## 2021-11-25 NOTE — Progress Notes (Signed)
11/25/2021    TELEHEALTH EVALUATION -- Audio/Visual (During XFGHW-29 public health emergency)    HPI:    [name] [name] (DOB:  16-Feb-1981) has requested an audio/video evaluation for the following concern(s):    Patient presented for follow up concerning several chronic medical concerns.    Anxiety/panic attacks/depression- mother is a hospice patient, she is under a lot of pressure, will increase her medication    Back pain- increased since depression is poorly controlled, anxiety poorly controlled, sleep poorly.     Today, denied denied chest pain, sob, n, v, or diarrhea.    Review of Systems   Constitutional:  Positive for fatigue. Negative for activity change.   Respiratory:  Negative for shortness of breath.    Cardiovascular:  Negative for chest pain.   Gastrointestinal:  Negative for abdominal pain, diarrhea, nausea and vomiting.   Musculoskeletal:  Positive for arthralgias.   Psychiatric/Behavioral:  Positive for dysphoric mood and sleep disturbance. Negative for self-injury and suicidal ideas. The patient is nervous/anxious.      Prior to Visit Medications    Medication Sig Taking? Authorizing Provider   sertraline (ZOLOFT) 100 MG tablet Take 1 tablet by mouth daily Yes Dakiyah Heinke C Gilverto Dileonardo, DO   gabapentin (NEURONTIN) 300 MG capsule Take 1 capsule by mouth 2 times daily for 180 days. Intended supply: 90 days Yes Silvano Bilis, DO   buprenorphine-naloxone (SUBOXONE) 8-2 MG SUBL SL tablet  Yes Historical Provider, MD   KLOXXADO 8 MG/0.1ML LIQD nasal spray  Yes Historical Provider, MD   ondansetron (ZOFRAN) 4 MG tablet  Yes Historical Provider, MD   fluticasone (FLONASE) 50 MCG/ACT nasal spray 2 sprays by Each Nostril route daily for the first 3 days, then decrease to 1 spray in each nostril once daily. Yes Darrin Cipriano Mile, PA-C   glucose monitoring (FREESTYLE FREEDOM) kit 1 kit by Does not apply route daily Yes Kysa Calais C Lizbeth Feijoo, DO   blood glucose monitor strips Test 1 times a day & as needed for symptoms of  irregular blood glucose. Dispense sufficient amount for indicated testing frequency plus additional to accommodate PRN testing needs. Yes Silvano Bilis, DO   ONE TOUCH ULTRASOFT LANCETS MISC Use to test sugar once daily. Yes Silvano Bilis, DO   medroxyPROGESTERone (PROVERA) 10 MG tablet Take 1 tablet by mouth in the morning. From the 10th to the 20th of each month. Yes Erven Colla, MD   acetaminophen (TYLENOL) 500 MG tablet Take 500 mg by mouth every 6 hours as needed Yes Historical Provider, MD   ibuprofen (IBU) 600 MG tablet Take 1 tablet by mouth in the morning and 1 tablet at noon and 1 tablet in the evening. Do all this for 5 days.  Darrin Cipriano Mile, PA-C       Social History     Tobacco Use    Smoking status: Former     Packs/day: 0.50     Years: 11.00     Pack years: 5.50     Types: Cigarettes    Smokeless tobacco: Never   Vaping Use    Vaping Use: Never used   Substance Use Topics    Alcohol use: Yes     Comment: every 2 months    Drug use: Yes     Types: Cocaine, Opiates , Marijuana Sherrie Mustache)     Comment: 07/13/18 heroin, cocaine, marijuana        Allergies   Allergen Reactions    Latex Itching, Rash and  Other (See Comments)     Burning to the touch     Latex Rash    Cephalexin Anaphylaxis       PHYSICAL EXAMINATION:  [ INSTRUCTIONS:  "'[x]' " Indicates a positive item  "'[]' " Indicates a negative item  -- DELETE ALL ITEMS NOT EXAMINED]  Vital Signs: (As obtained by patient/caregiver or practitioner observation)  See chart  Blood pressure-  Heart rate-    Respiratory rate-    Temperature-  Pulse oximetry-     Constitutional: '[x]'  Appears well-developed and well-nourished '[]'  No apparent distress      '[]'  Abnormal-   Mental status  '[x]'  Alert and awake  '[]'  Oriented to person/place/time '[]' Able to follow commands      Eyes:  EOM    '[x]'   Normal  '[]'  Abnormal-  Sclera  '[]'   Normal  '[]'  Abnormal -         Discharge '[]'   None visible  '[]'  Abnormal -    HENT:   '[x]'  Normocephalic, atraumatic.  '[]'  Abnormal   '[]'  Mouth/Throat:  Mucous membranes are moist.     External Ears '[x]'  Normal  '[]'  Abnormal-     Neck: '[x]'  No visualized mass     Pulmonary/Chest: '[x]'  Respiratory effort normal.  '[]'  No visualized signs of difficulty breathing or respiratory distress        '[]'  Abnormal-      Musculoskeletal:   '[x]'  Normal gait with no signs of ataxia         '[]'  Normal range of motion of neck        '[]'  Abnormal-       Neurological:        '[x]'  No Facial Asymmetry (Cranial nerve 7 motor function) (limited exam to video visit)          '[]'  No gaze palsy        '[]'  Abnormal-         Skin:        '[x]'  No significant exanthematous lesions or discoloration noted on facial skin         '[]'  Abnormal-            Psychiatric:       '[x]'  Normal Affect '[]'  No Hallucinations        '[]'  Abnormal-     Other pertinent observable physical exam findings-     ASSESSMENT/PLAN:  1. Panic attack  Stable  Continue with medication  Keep appointments with specialist.   Answered questions     2. Bipolar disorder, in partial remission, most recent episode depressed ([place])    Medication reviewed  Discussed side effects of medication  Discussed signs and symptoms for immediate evaluation in ER.  Answered questions.     3. Sacroiliitis ([place])  Medication increased  Questions answered  Reassured the patient.     No follow-ups on file.    [place], was evaluated through a synchronous (real-time) audio-video encounter. The patient (or guardian if applicable) is aware that this is a billable service, which includes applicable co-pays. This Virtual Visit was conducted with patient's (and/or legal guardian's) consent. The visit was conducted pursuant to the emergency declaration under the [place], [place] waiver authority and the [employer] Act.  Patient identification was verified, and a caregiver was present when appropriate.   The patient was located at Home: [place] [id number].    Provider was located  at Home ([place]): [address].       Total time spent on this encounter: Not billed by time    --Silvano Bilis, DO on 11/25/2021 at 10:11 AM    An electronic signature was used to authenticate this note.

## 2022-02-05 ENCOUNTER — Encounter: Payer: PRIVATE HEALTH INSURANCE | Attending: Family Medicine | Primary: Family Medicine

## 2022-03-08 ENCOUNTER — Encounter: Payer: PRIVATE HEALTH INSURANCE | Primary: Family Medicine

## 2022-03-08 ENCOUNTER — Encounter: Payer: MEDICAID | Attending: Family Medicine | Primary: Family Medicine

## 2022-03-09 ENCOUNTER — Encounter: Payer: PRIVATE HEALTH INSURANCE | Attending: Family Medicine | Primary: Family Medicine

## 2022-03-09 NOTE — Progress Notes (Signed)
This encounter was created in error - please disregard.

## 2022-06-17 ENCOUNTER — Telehealth: Admit: 2022-06-17 | Discharge: 2022-06-17 | Payer: MEDICAID | Attending: Family Medicine | Primary: Family Medicine

## 2022-06-17 DIAGNOSIS — F419 Anxiety disorder, unspecified: Secondary | ICD-10-CM

## 2022-06-17 MED ORDER — BUSPIRONE HCL 10 MG PO TABS
10 MG | ORAL_TABLET | Freq: Two times a day (BID) | ORAL | 0 refills | Status: DC
Start: 2022-06-17 — End: 2022-12-28

## 2022-06-17 MED ORDER — SERTRALINE HCL 100 MG PO TABS
100 MG | ORAL_TABLET | Freq: Every day | ORAL | 0 refills | Status: DC
Start: 2022-06-17 — End: 2022-12-28

## 2022-06-17 MED ORDER — GABAPENTIN 300 MG PO CAPS
300 MG | ORAL_CAPSULE | ORAL | 0 refills | Status: DC
Start: 2022-06-17 — End: 2023-01-18

## 2022-06-17 NOTE — Telephone Encounter (Signed)
Last office visit 06/17/2022       Next office visit scheduled Visit date not found    Requested Prescriptions     Pending Prescriptions Disp Refills    gabapentin (NEURONTIN) 300 MG capsule [Pharmacy Med Name: GABAPENTIN 300 MG CAPSULE] 60 capsule      Sig: TAKE ONE CAPSULE BY MOUTH TWICE A DAY FOR 180 DAYS

## 2022-06-17 NOTE — Progress Notes (Unsigned)
06/17/2022    TELEHEALTH EVALUATION -- Audio/Visual    HPI:    [name] [name] (DOB:  November 17, 1980) has requested an audio/video evaluation for the following concern(s):    Patient is needing refills of her medications  Today, chest pain, sob, n, v, or diarrhea    Review of Systems    Prior to Visit Medications    Medication Sig Taking? Authorizing Provider   sertraline (ZOLOFT) 100 MG tablet Take 1 tablet by mouth daily  Kalaysia Demonbreun C Naureen Benton, DO   gabapentin (NEURONTIN) 300 MG capsule Take 1 capsule by mouth 2 times daily for 180 days. Intended supply: 90 days  Arman Bogus, DO   buprenorphine-naloxone (SUBOXONE) 8-2 MG SUBL SL tablet   Historical Provider, MD   KLOXXADO 8 MG/0.1ML LIQD nasal spray   Historical Provider, MD   ondansetron (ZOFRAN) 4 MG tablet   Historical Provider, MD   fluticasone (FLONASE) 50 MCG/ACT nasal spray 2 sprays by Each Nostril route daily for the first 3 days, then decrease to 1 spray in each nostril once daily.  Darrin Erby Pian, PA-C   ibuprofen (IBU) 600 MG tablet Take 1 tablet by mouth in the morning and 1 tablet at noon and 1 tablet in the evening. Do all this for 5 days.  Darrin Erby Pian, PA-C   glucose monitoring (FREESTYLE FREEDOM) kit 1 kit by Does not apply route daily  Aron Inge C Lorae Roig, DO   blood glucose monitor strips Test 1 times a day & as needed for symptoms of irregular blood glucose. Dispense sufficient amount for indicated testing frequency plus additional to accommodate PRN testing needs.  Arman Bogus, DO   ONE TOUCH ULTRASOFT LANCETS MISC Use to test sugar once daily.  Arman Bogus, DO   medroxyPROGESTERone (PROVERA) 10 MG tablet Take 1 tablet by mouth in the morning. From the 10th to the 20th of each month.  Edrick Oh, MD   acetaminophen (TYLENOL) 500 MG tablet Take 1 tablet by mouth every 6 hours as needed  Historical Provider, MD       Social History     Tobacco Use    Smoking status: Former     Packs/day: 0.50     Years: 11.00     Pack years: 5.50     Types:  Cigarettes    Smokeless tobacco: Never   Vaping Use    Vaping Use: Never used   Substance Use Topics    Alcohol use: Yes     Comment: every 2 months    Drug use: Yes     Types: Cocaine, Opiates , Marijuana Sheran Fava)     Comment: 07/13/18 heroin, cocaine, marijuana        {F2 for Optional History SmartBlocks:28118}    PHYSICAL EXAMINATION:  [ INSTRUCTIONS:  "[x] " Indicates a positive item  "[] " Indicates a negative item  -- DELETE ALL ITEMS NOT EXAMINED]  Vital Signs: (As obtained by patient/caregiver or practitioner observation)    Blood pressure-  Heart rate-    Respiratory rate-    Temperature-  Pulse oximetry-     Constitutional: []  Appears well-developed and well-nourished []  No apparent distress      []  Abnormal-   Mental status  []  Alert and awake  []  Oriented to person/place/time [] Able to follow commands      Eyes:  EOM    []   Normal  []  Abnormal-  Sclera  []   Normal  []  Abnormal -  Discharge []   None visible  []  Abnormal -    HENT:   []  Normocephalic, atraumatic.  []  Abnormal   []  Mouth/Throat: Mucous membranes are moist.     External Ears []  Normal  []  Abnormal-     Neck: []  No visualized mass     Pulmonary/Chest: []  Respiratory effort normal.  []  No visualized signs of difficulty breathing or respiratory distress        []  Abnormal-      Musculoskeletal:   []  Normal gait with no signs of ataxia         []  Normal range of motion of neck        []  Abnormal-       Neurological:        []  No Facial Asymmetry (Cranial nerve 7 motor function) (limited exam to video visit)          []  No gaze palsy        []  Abnormal-         Skin:        []  No significant exanthematous lesions or discoloration noted on facial skin         []  Abnormal-            Psychiatric:       []  Normal Affect []  No Hallucinations        []  Abnormal-     Other pertinent observable physical exam findings-     ASSESSMENT/PLAN:  There are no diagnoses linked to this encounter.    No follow-ups on file.    Nira Retort, was evaluated  through a synchronous (real-time) audio-video encounter. The patient (or guardian if applicable) is aware that this is a billable service, which includes applicable co-pays. This Virtual Visit was conducted with patient's (and/or legal guardian's) consent. Patient identification was verified, and a caregiver was present when appropriate.   The patient was located at {Telehealth POS - Patient:[id number]}  Provider was located at {Telehealth POS - Provider:[id number]}        Total time spent on this encounter: {Time Spent:21065000}    --Arman Bogus, DO on 06/17/2022 at 10:45 AM    An electronic signature was used to authenticate this note.

## 2022-10-22 ENCOUNTER — Encounter: Payer: MEDICAID | Attending: Family Medicine | Primary: Family Medicine

## 2022-12-16 ENCOUNTER — Emergency Department: Admit: 2022-12-16 | Payer: PRIVATE HEALTH INSURANCE | Primary: Family Medicine

## 2022-12-16 ENCOUNTER — Inpatient Hospital Stay
Admit: 2022-12-16 | Discharge: 2022-12-16 | Disposition: A | Discharge status: Home / Self Care | Payer: PRIVATE HEALTH INSURANCE | Attending: Emergency Medicine

## 2022-12-16 DIAGNOSIS — S92355A Nondisplaced fracture of fifth metatarsal bone, left foot, initial encounter for closed fracture: Secondary | ICD-10-CM

## 2022-12-16 DIAGNOSIS — S92902A Unspecified fracture of left foot, initial encounter for closed fracture: Secondary | ICD-10-CM

## 2022-12-16 MED ORDER — MELOXICAM 7.5 MG PO TABS
7.5 MG | ORAL_TABLET | Freq: Every day | ORAL | 1 refills | Status: DC
Start: 2022-12-16 — End: 2023-01-18

## 2022-12-16 MED ORDER — HYDROCODONE-ACETAMINOPHEN 5-325 MG PO TABS
5-325 MG | ORAL_TABLET | ORAL | 0 refills | Status: AC | PRN
Start: 2022-12-16 — End: 2022-12-19

## 2022-12-16 MED ORDER — IBUPROFEN 800 MG PO TABS
800 | Freq: Once | ORAL | Status: AC
Start: 2022-12-16 — End: 2022-12-16
  Administered 2022-12-16: 19:00:00 800 mg via ORAL

## 2022-12-16 MED ORDER — HYDROCODONE-ACETAMINOPHEN 5-325 MG PO TABS
5-325 | Freq: Once | ORAL | Status: AC
Start: 2022-12-16 — End: 2022-12-16
  Administered 2022-12-16: 19:00:00 1 via ORAL

## 2022-12-16 MED FILL — IBUPROFEN 800 MG PO TABS: 800 MG | ORAL | Qty: 1

## 2022-12-16 MED FILL — HYDROCODONE-ACETAMINOPHEN 5-325 MG PO TABS: 5-325 MG | ORAL | Qty: 1

## 2022-12-16 NOTE — Telephone Encounter (Signed)
LVM for patient to return phone call.     Upon return call, Please offer 12/17/22 at [facility] 8:45 AM.

## 2022-12-16 NOTE — ED Provider Notes (Signed)
Emergency Department Encounter    Patient: [name] [name]  MRN: [id number]  DOB: June 04, 1981  Date of Evaluation: 12/17/2022  ED Provider:  Marcella Dubs, MD    Triage Chief Complaint:   Foot Injury (Pt states she fell yesterday and is having left (top) foot pain. )    HOPI:  [name] [name] is a 42 y.o. female that presents to the ER for evaluation of blunt trauma, severe left dorsal foot pain.  Pain with ambulation.  Event occurred 24 hours prior to arrival.  Severe pain.  Contusion.  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[place])     ?cancer or not    Bipolar affective disorder ([place])     Cervical fusion syndrome     Chronic pain     Depression     Gestational diabetes mellitus     Hepatitis A 07/14/2018    Hepatitis C 08/02/2017    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    History of heroin abuse (HCC)     HPV in female     Miscarriage     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    Neck pain     OCD (obsessive compulsive disorder)     Paranoia ([place])     Tobacco abuse      Past Surgical History:   Procedure Laterality Date    CERVICAL SPINE SURGERY      C1-C2 fusion 2003    CESAREAN SECTION      DILATION AND CURETTAGE OF UTERUS Left 06/25/2021    DILATATION AND CURETTAGE HYSTEROSCOPY performed by Erven Colla, MD at [facility] Left 06/25/2021    OPERATIVE LAPAROSCOPY, DRAINAGE OF LEFT OVARIAN CYST performed by Erven Colla, MD at [place] [facility] History   Problem Relation Age of Onset    Arthritis Other     Asthma Other     Cancer Other     Diabetes Other     Kidney Disease Other      Social History     Socioeconomic  History    Marital status: Divorced     Spouse name: Not on file    Number of children: 1    Years of education: Not on file    Highest education level: Not on file   Occupational History    Not on file   Tobacco Use    Smoking status: Former     Current packs/day: 0.50     Average packs/day: 0.5 packs/day for 11.0 years (5.5 ttl pk-yrs)     Types: Cigarettes    Smokeless tobacco: Never   Vaping Use    Vaping Use: Never used   Substance and Sexual Activity    Alcohol use: Yes     Comment: every 2 months    Drug use: Yes     Types: Cocaine, Opiates , Marijuana Sherrie Mustache)     Comment: 07/13/18 heroin, cocaine, marijuana  Sexual activity: Yes     Partners: Male   Other Topics Concern    Not on file   Social History Narrative    ** Merged History Encounter **          Social Determinants of Health     Financial Resource Strain: Low Risk  (04/28/2021)    Overall Financial Resource Strain (CARDIA)     Difficulty of Paying Living Expenses: Not hard at all   Food Insecurity: No Food Insecurity (04/28/2021)    Hunger Vital Sign     Worried About Running Out of Food in the Last Year: Never true     Ran Out of Food in the Last Year: Never true   Transportation Needs: Not on file   Physical Activity: Not on file   Stress: Not on file   Social Connections: Not on file   Intimate Partner Violence: Not on file   Housing Stability: Not on file     No current facility-administered medications for this encounter.     Current Outpatient Medications   Medication Sig Dispense Refill    meloxicam (MOBIC) 7.5 MG tablet Take 1 tablet by mouth daily 90 tablet 1    HYDROcodone-acetaminophen (NORCO) 5-325 MG per tablet Take 1 tablet by mouth every 4 hours as needed for Pain for up to 3 days. Intended supply: 3 days. Take lowest dose possible to manage pain Max Daily Amount: 6 tablets 18 tablet 0    sertraline (ZOLOFT) 100 MG tablet Take 1 tablet by mouth daily 30 tablet 0    gabapentin (NEURONTIN) 300 MG capsule TAKE ONE CAPSULE BY MOUTH TWICE A  DAY FOR 180 DAYS 180 capsule 0    buprenorphine-naloxone (SUBOXONE) 8-2 MG SUBL SL tablet       KLOXXADO 8 MG/0.1ML LIQD nasal spray       ondansetron (ZOFRAN) 4 MG tablet       fluticasone (FLONASE) 50 MCG/ACT nasal spray 2 sprays by Each Nostril route daily for the first 3 days, then decrease to 1 spray in each nostril once daily. 16 g 0    ibuprofen (IBU) 600 MG tablet Take 1 tablet by mouth in the morning and 1 tablet at noon and 1 tablet in the evening. Do all this for 5 days. 15 tablet 0    glucose monitoring (FREESTYLE FREEDOM) kit 1 kit by Does not apply route daily 1 kit 0    blood glucose monitor strips Test 1 times a day & as needed for symptoms of irregular blood glucose. Dispense sufficient amount for indicated testing frequency plus additional to accommodate PRN testing needs. 30 strip 5    ONE TOUCH ULTRASOFT LANCETS MISC Use to test sugar once daily. 100 each 3    medroxyPROGESTERone (PROVERA) 10 MG tablet Take 1 tablet by mouth in the morning. From the 10th to the 20th of each month. 10 tablet 11    acetaminophen (TYLENOL) 500 MG tablet Take 1 tablet by mouth every 6 hours as needed       Allergies   Allergen Reactions    Latex Itching, Rash and Other (See Comments)     Burning to the touch     Latex Rash    Cephalexin Anaphylaxis       Nursing Notes Reviewed    Physical Exam:  Triage VS:    ED Triage Vitals [12/16/22 1324]   Enc Vitals Group      BP 124/78      Pulse 85  Respirations 16      Temp 98.2 F (36.8 C)      Temp Source Oral      SpO2 97 %      Weight - Scale 58.9 kg (129 lb 14.4 oz)      Height 1.651 m (5' 5"$ )      Head Circumference       Peak Flow       Pain Score       Pain Loc       Pain Edu?       Excl. in [place]?        General appearance:  No acute distress.   Skin:  Warm. Dry.   Ears, nose, mouth and throat:  Oral mucosa moist   Extremity: Contusion left lateral foot tenderness fifth metatarsal     Perfusion:  intact          Neurological:  Alert and oriented times 3.  Motor and  sensory exam intact to affected extremity    I have reviewed and interpreted all of the currently available lab results from this visit (if applicable):  No results found for this visit on 12/16/22.   Radiographs (if obtained):  Radiologist's Report Reviewed:  XR FOOT LEFT (MIN 3 VIEWS)    Result Date: 12/16/2022  XR FOOT LEFT (MIN 3 VIEWS) Indication: Left foot injury- fall COMPARISON: 02/05/2011 Findings: 3 views of the left foot were obtained. There is a nondisplaced fracture of the fifth metatarsal, oblique. This is most apparent on lateral view. No additional fracture deformity. There is mild overlying soft tissue swelling. No dislocation.     Nondisplaced fracture of the fifth metatarsal.      MDM:  Patient presented with musculoskeletal complaints.  Based on patient's presentation, history and physical exam presentation appears most consistent with a muscular/ligamentous injury.  There is evidence of fifth metatarsal fracture, she will follow outpatient with Dr Barnet Pall.  Patient does not have any evidence of compartment syndrome, necrotizing process, deep venous thrombosis, or neurovascular deficits.  The patient understands that at this time there is no evidence for a more significant underlying process, and that early in a process of such an injury and initial workup can be falsely negative.  Patient's symptoms will be treated symptomatically, prescriptions will be provided, they will be discharged to follow-up as an outpatient, they understand and agree with the plan, return warnings given.    Clinical Impression:  1. Foot fracture, left, closed, initial encounter      Disposition referral (if applicable):  [address], [address]  [address]  [place] [address]  [phone]          Vevelyn Francois, [place] [address]  [phone]          Disposition medications (if applicable):  Discharge Medication List as of 12/16/2022  2:05 PM        START taking  these medications    Details   meloxicam (MOBIC) 7.5 MG tablet Take 1 tablet by mouth daily, Disp-90 tablet, R-1Normal      HYDROcodone-acetaminophen (NORCO) 5-325 MG per tablet Take 1 tablet by mouth every 4 hours as needed for Pain for up to 3 days. Intended supply: 3 days. Take lowest dose possible to manage pain Max Daily Amount: 6 tablets, Disp-18 tablet, R-0Normal           ED Provider Disposition Time  DISPOSITION  Decision To Discharge 12/16/2022 01:53:23 PM      Comment: Please note this report has been produced using speech recognition software and may contain errors related to that system including errors in grammar, punctuation, and spelling, as well as words and phrases that may be inappropriate. If there are any questions or concerns please feel free to contact the dictating provider for clarification.      Marcella Dubs, MD  12/17/22 1435

## 2022-12-16 NOTE — ED Notes (Signed)
Ice to left foot.

## 2022-12-17 ENCOUNTER — Ambulatory Visit
Admit: 2022-12-17 | Discharge: 2022-12-17 | Payer: PRIVATE HEALTH INSURANCE | Attending: Orthopaedic Surgery | Primary: Family Medicine

## 2022-12-17 DIAGNOSIS — S92352A Displaced fracture of fifth metatarsal bone, left foot, initial encounter for closed fracture: Secondary | ICD-10-CM

## 2022-12-17 NOTE — Progress Notes (Signed)
CHIEF COMPLAINT: Left foot pain/ 5th MT shaft minimally displaced fracture.    DATE OF INJURY: 12/15/2022, DOT: 12/17/2022    HISTORY:  [name] 42 y.o. caucasian female presents today for the first visit for evaluation of a left foot injury which occurred when she fell.   She is complaining of lateral foot pain and swelling. Rates pain a 8/10 VAS.  This is better with elevation and worse with bearing any wt.  The pain is sharp and not radiating. No other complaint. She was seen 1st at [facility] ER, where she was x-rayed and splinted and asked to f/u with orthopaedics.  She is currently not working.    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    Neck pain     OCD (obsessive compulsive disorder)     Paranoia ([place])     Tobacco abuse        Past Surgical History:   Procedure Laterality Date    CERVICAL SPINE SURGERY      C1-C2 fusion 2003    CESAREAN SECTION      DILATION AND CURETTAGE OF UTERUS Left 06/25/2021    DILATATION AND CURETTAGE HYSTEROSCOPY performed by Erven Colla, MD at [place] Left 06/25/2021    OPERATIVE LAPAROSCOPY, DRAINAGE OF LEFT OVARIAN CYST performed by Erven Colla, MD at [place] History     Socioeconomic History    Marital status: Divorced     Spouse name: Not on file    Number of children: 1    Years of education: Not on file    Highest education level: Not on file   Occupational History    Not on file   Tobacco Use    Smoking status: Former     Current packs/day: 0.50     Average packs/day: 0.5 packs/day for 11.0 years (5.5 ttl pk-yrs)      Types: Cigarettes    Smokeless tobacco: Never   Vaping Use    Vaping Use: Never used   Substance and Sexual Activity    Alcohol use: Yes     Comment: every 2 months    Drug use: Yes     Types: Cocaine, Opiates , Marijuana Sherrie Mustache)     Comment: 07/13/18 heroin, cocaine, marijuana    Sexual activity: Yes     Partners: Male   Other Topics Concern    Not on file   Social History Narrative    ** Merged History Encounter **          Social Determinants of Health     Financial Resource Strain: Low Risk  (04/28/2021)    Overall Financial Resource Strain (CARDIA)     Difficulty of Paying Living Expenses: Not hard at all   Food Insecurity: No Food  Insecurity (04/28/2021)    Hunger Vital Sign     Worried About Running Out of Food in the Last Year: Never true     Ran Out of Food in the Last Year: Never true   Transportation Needs: Not on file   Physical Activity: Not on file   Stress: Not on file   Social Connections: Not on file   Intimate Partner Violence: Not on file   Housing Stability: Not on file       Family History   Problem Relation Age of Onset    Arthritis Other     Asthma Other     Cancer Other     Diabetes Other     Kidney Disease Other        Current Outpatient Medications on File Prior to Visit   Medication Sig Dispense Refill    meloxicam (MOBIC) 7.5 MG tablet Take 1 tablet by mouth daily 90 tablet 1    HYDROcodone-acetaminophen (NORCO) 5-325 MG per tablet Take 1 tablet by mouth every 4 hours as needed for Pain for up to 3 days. Intended supply: 3 days. Take lowest dose possible to manage pain Max Daily Amount: 6 tablets 18 tablet 0    sertraline (ZOLOFT) 100 MG tablet Take 1 tablet by mouth daily 30 tablet 0    gabapentin (NEURONTIN) 300 MG capsule TAKE ONE CAPSULE BY MOUTH TWICE A DAY FOR 180 DAYS 180 capsule 0    buprenorphine-naloxone (SUBOXONE) 8-2 MG SUBL SL tablet       KLOXXADO 8 MG/0.1ML LIQD nasal spray       ondansetron (ZOFRAN) 4 MG tablet       fluticasone (FLONASE) 50 MCG/ACT nasal spray 2 sprays by  Each Nostril route daily for the first 3 days, then decrease to 1 spray in each nostril once daily. 16 g 0    ibuprofen (IBU) 600 MG tablet Take 1 tablet by mouth in the morning and 1 tablet at noon and 1 tablet in the evening. Do all this for 5 days. 15 tablet 0    glucose monitoring (FREESTYLE FREEDOM) kit 1 kit by Does not apply route daily 1 kit 0    blood glucose monitor strips Test 1 times a day & as needed for symptoms of irregular blood glucose. Dispense sufficient amount for indicated testing frequency plus additional to accommodate PRN testing needs. 30 strip 5    ONE TOUCH ULTRASOFT LANCETS MISC Use to test sugar once daily. 100 each 3    medroxyPROGESTERone (PROVERA) 10 MG tablet Take 1 tablet by mouth in the morning. From the 10th to the 20th of each month. 10 tablet 11    acetaminophen (TYLENOL) 500 MG tablet Take 1 tablet by mouth every 6 hours as needed       No current facility-administered medications on file prior to visit.       Pertinent items are noted in HPI  Review of systems reviewed from Patient History Form and available in the patient's chart under the Media tab.        PHYSICAL EXAMINATION:  [name] is a very pleasant 42 y.o. Caucasian female who presents today in no acute distress, awake, alert, and oriented.  She is well dressed, nourished and  groomed.  Patient with normal affect.  Height is  1.651 m (5' 5"$ ), weight is 58.5 kg (129 lb), Body mass index is 21.47 kg/m.  Resting respiratory rate is 16.     Examination of the gait,  showed that the patient walks with a limp, WB left leg in a boot.  Examination of both ankles showing a decreased range of motion of the left ankle compare to the other side because of foot pain.  There is moderate swelling that can be seen, as well as ecchymosis over lateral side of the left foot. She  has intact sensation and good pedal pulses.  She has significant tenderness on deep palpation over the 5th MT shaft left foot.        IMAGING:  Burgess Estelle were reviewed, dated 12/16/2022 from [facility] ER,  3 views of the left foot, and showed a minimally displaced 5th MT shaft fracture.      IMPRESSION: Left 5th MT shaft minimally displaced fracture.    PLAN: I discussed that the overall alignment of this fracture is good and that we can try to treat this non-operatively in a boot with PWB on the heel.  We discussed the risk of nonunion and  malunion.  We applied a short boot today in the office and instructed the patient  in care.  Rest, ice and elevation.  We will see her  back in 6 weeks at which time we will get a new xray of the left foot.        Vevelyn Francois, MD

## 2022-12-20 ENCOUNTER — Encounter

## 2022-12-20 NOTE — Telephone Encounter (Signed)
Prescription Refill     Medication Name:  [place]: [facility]  Patient Contact Number:  [phone]

## 2022-12-20 NOTE — Telephone Encounter (Signed)
[facility] approved Norco. Routed to [facility] for sig.

## 2022-12-20 NOTE — Telephone Encounter (Signed)
Patient seen 12/17/2022 for left 5th MT shaft minimally displaced fracture. She is being treated non-operatively in a boot with PWB on the heel. She was prescribed Norco in the ED on 12/16/2022 (end 12/19/2022). Requests a refill of [place].     Will need to discuss with provider on tramadol vs norco.

## 2022-12-21 MED ORDER — HYDROCODONE-ACETAMINOPHEN 5-325 MG PO TABS
5-325 MG | ORAL_TABLET | Freq: Four times a day (QID) | ORAL | 0 refills | Status: AC | PRN
Start: 2022-12-21 — End: 2022-12-25

## 2022-12-27 ENCOUNTER — Encounter

## 2022-12-27 MED ORDER — HYDROCODONE-ACETAMINOPHEN 5-325 MG PO TABS
5-325 MG | ORAL_TABLET | Freq: Three times a day (TID) | ORAL | 0 refills | Status: AC | PRN
Start: 2022-12-27 — End: 2023-01-01

## 2022-12-27 NOTE — Telephone Encounter (Signed)
Spoke with patient. Informed her the it isn't necessary for the air bladders to be inflated to protect her injury. Also suggested she continue to ice and elevate above heart level to help decrease pain, swelling and bruising. Norco routed to Dr. Barnet Pall.
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
## 2022-12-28 MED ORDER — SERTRALINE HCL 100 MG PO TABS
100 MG | ORAL_TABLET | Freq: Every day | ORAL | 0 refills | Status: AC
Start: 2022-12-28 — End: 2023-01-18

## 2022-12-28 MED ORDER — BUSPIRONE HCL 10 MG PO TABS
10 MG | ORAL_TABLET | Freq: Two times a day (BID) | ORAL | 0 refills | Status: AC
Start: 2022-12-28 — End: 2023-03-15
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
## 2022-12-28 NOTE — Telephone Encounter (Signed)
Last office visit 06/17/2022       Next office visit scheduled Visit date not found    Requested Prescriptions     Pending Prescriptions Disp Refills    sertraline (ZOLOFT) 100 MG tablet [Pharmacy Med Name: SERTRALINE HCL 100 MG TABLET] 30 tablet 0     Sig: Take 1 tablet by mouth daily    busPIRone (BUSPAR) 10 MG tablet [Pharmacy Med Name: busPIRone HCL 10 MG TABLET] 60 tablet 0     Sig: TAKE 1 TABLET BY MOUTH TWICE A DAY

## 2022-12-29 NOTE — Telephone Encounter (Signed)
Have patient make an appointment?

## 2023-01-13 NOTE — Telephone Encounter (Signed)
LVM, SENT LETTER TO [facility] W/THERESA COLE WHILE DR AREBI OUT OF TOWN

## 2023-01-14 ENCOUNTER — Encounter: Payer: PRIVATE HEALTH INSURANCE | Attending: Family Medicine | Primary: Family Medicine

## 2023-01-18 ENCOUNTER — Ambulatory Visit
Admit: 2023-01-18 | Discharge: 2023-01-18 | Payer: PRIVATE HEALTH INSURANCE | Attending: Family Medicine | Primary: Family Medicine

## 2023-01-18 DIAGNOSIS — E119 Type 2 diabetes mellitus without complications: Secondary | ICD-10-CM

## 2023-01-18 MED ORDER — SERTRALINE HCL 50 MG PO TABS
50 MG | ORAL_TABLET | Freq: Every day | ORAL | 1 refills | Status: AC
Start: 2023-01-18 — End: 2023-03-15

## 2023-01-18 MED ORDER — GABAPENTIN 100 MG PO CAPS
100 | ORAL_CAPSULE | Freq: Three times a day (TID) | ORAL | 5 refills | Status: AC
Start: 2023-01-18 — End: 2023-07-17

## 2023-01-18 NOTE — Progress Notes (Unsigned)
01/18/2023     [name] [name] (DOB:  1981/01/05) is a 42 y.o. female, here for evaluation of the following medical concerns:    HPI    Review of Systems   Constitutional:  Negative for activity change, fatigue, fever and unexpected weight change.   HENT:  Negative for congestion, ear pain, facial swelling, mouth sores, rhinorrhea, sinus pressure, sore throat and trouble swallowing.    Eyes:  Negative for visual disturbance.   Respiratory:  Negative for cough, chest tightness, shortness of breath and wheezing.    Cardiovascular:  Negative for chest pain and leg swelling.   Gastrointestinal:  Negative for abdominal pain, diarrhea, nausea and vomiting.   Endocrine: Negative for cold intolerance, heat intolerance, polydipsia and polyphagia.   Genitourinary:  Negative for dyspareunia, flank pain, frequency, hematuria, pelvic pain, urgency and vaginal bleeding.   Musculoskeletal:  Negative for arthralgias.   Skin:  Negative for rash.   Allergic/Immunologic: Negative for food allergies.   Neurological:  Negative for dizziness, tremors, syncope, numbness and headaches.   Hematological:  Does not bruise/bleed easily.   Psychiatric/Behavioral:  Negative for suicidal ideas. The patient is not nervous/anxious.        Prior to Visit Medications    Medication Sig Taking? Authorizing Provider   gabapentin (NEURONTIN) 100 MG capsule Take 1 capsule by mouth 3 times daily for 180 days. Intended supply: 30 days Yes Skyla Champagne C, DO   sertraline (ZOLOFT) 50 MG tablet Take 1 tablet by mouth daily Yes Filimon Miranda C, DO   busPIRone (BUSPAR) 10 MG tablet TAKE 1 TABLET BY MOUTH TWICE A DAY Yes Calee Nugent C, DO   glucose monitoring (FREESTYLE FREEDOM) kit 1 kit by Does not apply route daily Yes Masayuki Sakai C, DO   blood glucose monitor strips Test 1 times a day & as needed for symptoms of irregular blood glucose. Dispense sufficient amount for indicated testing frequency plus additional to accommodate PRN testing needs. Yes  Gauge Winski C, DO   ONE TOUCH ULTRASOFT LANCETS MISC Use to test sugar once daily. Yes Arman Bogus, DO   acetaminophen (TYLENOL) 500 MG tablet Take 1 tablet by mouth every 6 hours as needed Yes [provider]        Social History     Tobacco Use    Smoking status: Former     Current packs/day: 0.50     Average packs/day: 0.5 packs/day for 11.0 years (5.5 ttl pk-yrs)     Types: Cigarettes    Smokeless tobacco: Never   Substance Use Topics    Alcohol use: Yes     Comment: every 2 months        Vitals:    01/18/23 1140   BP: 120/72   Weight: 59.9 kg (132 lb)   Height: 1.651 m (5\' 5" )     Estimated body mass index is 21.97 kg/m as calculated from the following:    Height as of this encounter: 1.651 m (5\' 5" ).    Weight as of this encounter: 59.9 kg (132 lb).    Physical Exam  Constitutional:       Appearance: She is well-developed.   HENT:      Head: Normocephalic and atraumatic.      Right Ear: External ear normal.      Left Ear: External ear normal.      Nose: Nose normal.   Eyes:      Conjunctiva/sclera: Conjunctivae normal.      Pupils:  Pupils are equal, round, and reactive to light.   Cardiovascular:      Rate and Rhythm: Normal rate and regular rhythm.      Heart sounds: Normal heart sounds.   Pulmonary:      Effort: Pulmonary effort is normal.      Breath sounds: Normal breath sounds.   Abdominal:      General: Bowel sounds are normal.      Palpations: Abdomen is soft.   Musculoskeletal:         General: Normal range of motion.      Cervical back: Normal range of motion and neck supple.   Skin:     General: Skin is warm and dry.   Neurological:      Mental Status: She is alert and oriented to person, place, and time.   Psychiatric:         Behavior: Behavior normal.         Thought Content: Thought content normal.         Judgment: Judgment normal.         ASSESSMENT/PLAN:  1. Type 2 diabetes mellitus without complication, without long-term current use of insulin (HCC)  ***  - CBC with Auto  Differential  - Lipid Panel  - TSH with Reflex to FT4  - Comprehensive Metabolic Panel  - Hemoglobin A1C    2. Closed displaced fracture of fifth metatarsal bone of left foot with routine healing, subsequent encounter  ***  - CBC with Auto Differential  - Lipid Panel  - TSH with Reflex to FT4  - Comprehensive Metabolic Panel  - Hemoglobin A1C    3. Anxiety and depression  ***  - CBC with Auto Differential  - Lipid Panel  - TSH with Reflex to FT4  - Comprehensive Metabolic Panel  - Hemoglobin A1C    4. Neuropathy  ***  - CBC with Auto Differential  - Lipid Panel  - TSH with Reflex to FT4  - Comprehensive Metabolic Panel  - Hemoglobin A1C    5. Benign tumor of pituitary gland (HCC)  ***    6. Pituitary mass (HCC)  ***    7. Bipolar disorder, in partial remission, most recent episode depressed (HCC)  ***    8. Polysubstance (excluding opioids) dependence (HCC)  ***    9. Elevated liver enzymes  ***      No follow-ups on file.

## 2023-01-19 LAB — CBC WITH AUTO DIFFERENTIAL
Basophils %: 0.7 %
Basophils Absolute: 0 10*3/uL (ref 0.0–0.2)
Eosinophils %: 1.4 %
Eosinophils Absolute: 0.1 10*3/uL (ref 0.0–0.6)
Hematocrit: 41.5 % (ref 36.0–48.0)
Hemoglobin: 14.1 g/dL (ref 12.0–16.0)
Lymphocytes %: 37.5 %
Lymphocytes Absolute: 1.7 10*3/uL (ref 1.0–5.1)
MCH: 31 pg (ref 26.0–34.0)
MCHC: 33.9 g/dL (ref 31.0–36.0)
MCV: 91.3 fL (ref 80.0–100.0)
MPV: 9.5 fL (ref 5.0–10.5)
Monocytes %: 6.5 %
Monocytes Absolute: 0.3 10*3/uL (ref 0.0–1.3)
Neutrophils %: 53.9 %
Neutrophils Absolute: 2.4 10*3/uL (ref 1.7–7.7)
Platelets: 163 10*3/uL (ref 135–450)
RBC: 4.55 M/uL (ref 4.00–5.20)
RDW: 13.6 % (ref 12.4–15.4)
WBC: 4.4 10*3/uL (ref 4.0–11.0)

## 2023-01-19 LAB — COMPREHENSIVE METABOLIC PANEL
ALT: 117 U/L — ABNORMAL HIGH (ref 10–40)
AST: 91 U/L — ABNORMAL HIGH (ref 15–37)
Albumin/Globulin Ratio: 2.1 (ref 1.1–2.2)
Albumin: 4.4 g/dL (ref 3.4–5.0)
Alkaline Phosphatase: 90 U/L (ref 40–129)
Anion Gap: 12 (ref 3–16)
BUN: 12 mg/dL (ref 7–20)
CO2: 28 mmol/L (ref 21–32)
Calcium: 9.5 mg/dL (ref 8.3–10.6)
Chloride: 96 mmol/L — ABNORMAL LOW (ref 99–110)
Creatinine: <0.5 mg/dL — ABNORMAL LOW (ref 0.6–1.1)
Est, Glom Filt Rate: >60 (ref >60)
Glucose: 169 mg/dL — ABNORMAL HIGH (ref 70–99)
Potassium: 3.8 mmol/L (ref 3.5–5.1)
Sodium: 136 mmol/L (ref 136–145)
Total Bilirubin: 0.3 mg/dL (ref 0.0–1.0)
Total Protein: 6.5 g/dL (ref 6.4–8.2)

## 2023-01-19 LAB — LIPID PANEL
Cholesterol, Total: 152 mg/dL (ref 0–199)
HDL: 42 mg/dL (ref 40–60)
LDL Calculated: 79 mg/dL (ref <100)
Triglycerides: 153 mg/dL — ABNORMAL HIGH (ref 0–150)
VLDL Cholesterol Calculated: 31 mg/dL

## 2023-01-19 LAB — TSH WITH REFLEX TO FT4: TSH Reflex FT4: 1.77 u[IU]/mL (ref 0.27–4.20)

## 2023-01-19 LAB — HEMOGLOBIN A1C
Estimated Avg Glucose: 145.6 mg/dL
Hemoglobin A1C: 6.7 %

## 2023-01-27 ENCOUNTER — Ambulatory Visit: Admit: 2023-01-27 | Payer: PRIVATE HEALTH INSURANCE | Primary: Family Medicine

## 2023-01-27 ENCOUNTER — Ambulatory Visit
Admit: 2023-01-27 | Discharge: 2023-01-27 | Payer: PRIVATE HEALTH INSURANCE | Attending: Family | Primary: Family Medicine

## 2023-01-27 DIAGNOSIS — S92352A Displaced fracture of fifth metatarsal bone, left foot, initial encounter for closed fracture: Secondary | ICD-10-CM

## 2023-01-27 MED ORDER — TRAMADOL HCL 50 MG PO TABS
50 | ORAL_TABLET | Freq: Three times a day (TID) | ORAL | 0 refills | Status: AC | PRN
Start: 2023-01-27 — End: 2023-02-01

## 2023-01-27 NOTE — Progress Notes (Signed)
CHIEF COMPLAINT: Left foot pain/ 5th MT shaft minimally displaced fracture.    DATE OF INJURY: 12/15/2022, DOT: 12/17/2022    HISTORY:  [name] 42 y.o. caucasian female with hep C presents today for follow up visit for evaluation of a left foot injury which occurred when she fell.   She is complaining of lateral foot pain and swelling. Rates pain a 8/10 VAS.  This is better with elevation and worse with bearing any wt.  The pain is sharp and not radiating.  She reports that she stubbed her toe again yesterday and now her pain is worse.  No other complaint. She was seen 1st at [facility], where she was x-rayed and splinted and asked to f/u with orthopaedics.  She is currently not working.  She is a type II diabetic.  She has a history of drug abuse.    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    Neck pain     OCD (obsessive compulsive disorder)     Paranoia ([place])     Tobacco abuse        Past Surgical History:   Procedure Laterality Date    CERVICAL SPINE SURGERY      C1-C2 fusion 2003    CESAREAN SECTION      DILATION AND CURETTAGE OF UTERUS Left 06/25/2021    DILATATION AND CURETTAGE HYSTEROSCOPY performed by Erven Colla, MD at [place] Left 06/25/2021    OPERATIVE LAPAROSCOPY, DRAINAGE OF LEFT OVARIAN CYST performed by Erven Colla, MD at [facility] History     Socioeconomic History    Marital status: Divorced     Spouse name: Not on file    Number of children: 1    Years of education: Not on file    Highest education level: Not on file   Occupational History     Not on file   Tobacco Use    Smoking status: Former     Current packs/day: 0.50     Average packs/day: 0.5 packs/day for 11.0 years (5.5 ttl pk-yrs)     Types: Cigarettes    Smokeless tobacco: Never   Vaping Use    Vaping Use: Never used   Substance and Sexual Activity    Alcohol use: Yes     Comment: every 2 months    Drug use: Yes     Types: Cocaine, Opiates , Marijuana Sherrie Mustache)     Comment: 07/13/18 heroin, cocaine, marijuana    Sexual activity: Yes     Partners: Male   Other Topics Concern    Not on file   Social History Narrative    ** Merged History Encounter **          Social Determinants of Health  Financial Resource Strain: Low Risk  (04/28/2021)    Overall Financial Resource Strain (CARDIA)     Difficulty of Paying Living Expenses: Not hard at all   Food Insecurity: No Food Insecurity (04/28/2021)    Hunger Vital Sign     Worried About Running Out of Food in the Last Year: Never true     Ran Out of Food in the Last Year: Never true   Transportation Needs: Not on file   Physical Activity: Not on file   Stress: Not on file   Social Connections: Not on file   Intimate Partner Violence: Not on file   Housing Stability: Not on file       Family History   Problem Relation Age of Onset    Arthritis Other     Asthma Other     Cancer Other     Diabetes Other     Kidney Disease Other        Current Outpatient Medications on File Prior to Visit   Medication Sig Dispense Refill    gabapentin (NEURONTIN) 100 MG capsule Take 1 capsule by mouth 3 times daily for 180 days. Intended supply: 30 days 90 capsule 5    sertraline (ZOLOFT) 50 MG tablet Take 1 tablet by mouth daily 90 tablet 1    busPIRone (BUSPAR) 10 MG tablet TAKE 1 TABLET BY MOUTH TWICE A DAY 60 tablet 0    glucose monitoring (FREESTYLE FREEDOM) kit 1 kit by Does not apply route daily 1 kit 0    blood glucose monitor strips Test 1 times a day & as needed for symptoms of irregular blood glucose. Dispense sufficient amount for indicated testing frequency plus  additional to accommodate PRN testing needs. 30 strip 5    ONE TOUCH ULTRASOFT LANCETS MISC Use to test sugar once daily. 100 each 3    acetaminophen (TYLENOL) 500 MG tablet Take 1 tablet by mouth every 6 hours as needed       No current facility-administered medications on file prior to visit.       Pertinent items are noted in HPI  Review of systems reviewed from Patient History Form and available in the patient's chart under the Media tab.        PHYSICAL EXAMINATION:  [name] is a very pleasant 42 y.o. Caucasian female who presents today in no acute distress, awake, alert, and oriented.  She is well dressed, nourished and  groomed.  Patient with normal affect.  Height is  1.651 m (5\' 5" ), weight is 59.9 kg (132 lb), Body mass index is 21.97 kg/m.  Resting respiratory rate is 16.     Examination of the gait, showed that the patient walks with a limp, WB left leg in a boot.  Examination of both ankles showing a decreased range of motion of the left ankle compare to the other side because of foot pain.  There is moderate swelling that can be seen, no ecchymosis over lateral side of the left foot. She  has intact sensation and good pedal pulses.  She has moderate tenderness on deep palpation over the 5th MT shaft left foot.        IMAGING: Burgess Estelle were reviewed, dated today in office,  3 views of the left foot, and showed a minimally displaced 5th MT shaft fracture.      IMPRESSION: Left 5th MT shaft minimally displaced fracture.    PLAN: I discussed that the overall alignment of this fracture is good.  She can be weightbearing in a boot.  She can discontinue the boot in 1 week.  No heavy impact activities.  We discussed the risk of nonunion and  malunion.  She was instructed to work on range of motion and strengthening exercises and she would like to do this on her own.  We will see her  back in 2 months at which time we will get a new xray of the left foot.        Judithann Sheen, APRN - CNP

## 2023-01-28 ENCOUNTER — Encounter: Payer: PRIVATE HEALTH INSURANCE | Attending: Orthopaedic Surgery | Primary: Family Medicine

## 2023-01-28 NOTE — Telephone Encounter (Signed)
From: [name] [name]  To: Dr. Silvano Bilis  Sent: 01/28/2023 10:37 AM EDT  Subject: Neurotin     Good morning doctor. I was reaching out to ask if u could increase the gabapentin. U had me taking 300 twice a day I believe it would help a bit better. I'm having a hard time sleeping at night. If at all possible I'd greatly appreciate it. Also I was wondering if u could give me a name of an Endocrinologist. Please if ur able let me know what you think. I thank you so much.

## 2023-02-18 ENCOUNTER — Encounter: Payer: PRIVATE HEALTH INSURANCE | Attending: Family Medicine | Primary: Family Medicine

## 2023-03-15 ENCOUNTER — Ambulatory Visit
Admit: 2023-03-15 | Discharge: 2023-03-15 | Payer: PRIVATE HEALTH INSURANCE | Attending: Family Medicine | Primary: Family Medicine

## 2023-03-15 DIAGNOSIS — E119 Type 2 diabetes mellitus without complications: Secondary | ICD-10-CM

## 2023-03-15 MED ORDER — FLUOXETINE HCL 20 MG PO CAPS
20 MG | ORAL_CAPSULE | Freq: Every day | ORAL | 3 refills | Status: AC
Start: 2023-03-15 — End: 2023-11-17

## 2023-03-15 MED ORDER — HYDROXYZINE PAMOATE 25 MG PO CAPS
25 | ORAL_CAPSULE | Freq: Three times a day (TID) | ORAL | 2 refills | Status: AC | PRN
Start: 2023-03-15 — End: 2023-04-14

## 2023-03-15 NOTE — Patient Instructions (Signed)
[facility] Financial Resources*  (Call 211 if need more resources.)     [facility] Financial Assistance  What they offer: Financial assistance programs that are designed to assist you in finding resources that may help pay your hospital bill. Please click on the links below to learn more about the financial assistance programs available within our regions.  Phone Number: [phone]  How to apply for the [facility] Financial Assistance Program:       Option 1: To apply for financial assistance, a patient (or their family or other provider) should fill out the Financial Assistance Application. Copies of the Financial Assistance Application and the FAP may be obtained for free by calling the [facility] Customer Service department at [phone]   Option 2: The Financial Assistance Application and policy may be obtained for free by downloading a copy from the [facility] website:  https://www.[place].com/patient-resources/financial-assistance  [facility] Partnership Program  What they offer: If financial strain is hindering your ability to meet health care needs, the [facility] Partnership Program may be able to help. It provides support to patients facing complex health issues and social barriers. These services are provided at no cost.   Eligible Patients  Adults who are at or below 200% poverty level  Uninsured, underinsured, or those at risk of losing health insurance  You may be eligible to receive:  One-on-one support enrolling in Medicaid, Marketplace health care coverage, financial assistance, and other benefit programs  Short-term or long-term case management to support and help connect you to appropriate health care services and community-based services.   Assistance with Primary care and prescription costs  Case manager referrals as a partnering agency with [employer] to support those overcoming homelessness and living in extreme poverty.   How to Contact:   Phone: Main line  [phone]  Case Manager for [place]: [phone]  Case Manager for [place]:  [phone]    [facility] Care Assurance Program  What they offer:  Patients who need hospital care, but are unable to pay for it, may be eligible for free or reduced fee care at  hospitals through the Hospital Care Assurance Program (HCAP). Applications for HCAP are accepted by the hospital where care was received, and patients seeking HCAP assistance should contact their hospital's billing department for application instructions  Website: https://uh[place]io.org/hcap/     RENT / UTILITIES AND FINANCIAL ASSISTANCE PROGRAMS    [place]  Free Store / Foodbank  What they offer:  Food, rent and utility assistance     Phone Number: [phone]  Address: [address], [place] [address]   Website: https://freestorefoodbank.org/    [employer]   What they offer:  Rent and utility assistance      Phone Number: [phone]  Address: [address], [place], [address]   Website: https://www.cincinnatigoodwill.org     Job and Family Services (HEAP)  What they offer:  Utility assistance with heating bills       Phone Number: [phone]  Address: [address], [place]   Website: https://www.cincy-caa.org/homenergy.asp    [employer]  What they offer:  Rent, utility assistance and financial assistance   Phone Number: [phone]  Address: [address], [place] [address]   Website: https://www.svdpcincinnati.org/     [employer]  What they offer:  Utility assistance      Phone Number: [phone]  Address: [address], [place], [address]   Website:   http://www.use.salvationarmy.org/      [place]  Community Action Agency (SELF)  What they offer:  Food, rent and utility assistance     Phone Number: [phone]  Address: [address], [place], [address]   Website:      [facility]  What they offer:  Food, rent  and utility assistance     Phone Number: [phone]  Address: [address] [place] [address]   Website: http://www.frcoxford.org/     [employer]  What they offer: Rent and utility assistance     Phone Number:   [place]: [phone]  [place]: [phone]  Address:  [place]: [address] [place] [address]  [place]: [address] [place] [address]    Website: http://www.use.salvationarmy.org/    [place]  [place] [employer]   What they offer:  Food, rent and utility assistance     Phone Number: [phone]  Address: [address], [place] [address]   Website: http://www.cccsi.org/about.html     [employer]  What they offer:  Rent, utility assistance and financial assistance   Phone Number: [phone]  Address: [address], [place] [address]   Website: https://www.svdpcincinnati.org/     [employer] [facility] [place]   What they offer:  Food, rent, utility and financial assistance   Phone Number: [phone]  Address: [address] [place], [address]   Website: https://www.salvos.com/[place]/default.htm        [place]  [employer]   What they offer:  Rent and utility assistance     Phone Number: [phone]  Address: [address] [place] [address]   Website: http://www.bumcinfo.org/ministries/missions/local/   [employer] [employer]  What they offer:  Rent and utility assistance     Phone Number: [phone]  Address: [address], [place] [address]    Website: http://www.w3cu.com/   [place]  [place] [employer]  What they offer:  Food, rent and utility assistance     Phone Number: [phone]  Address: [address], [place] [address]   Website: https://clintoncap.org/     [employer] [place]  What they offer:  Food, rent and utility assistance     Phone Number: [phone]  Address: [address], [place] [address]   Website: https://www.salvationarmyusa.org/usn/       [place]  [place] [employer]   What they offer:  Rent and utility assistance     Phone Number: [phone]  Address: [address], [place] [address]   Website: https://www.co.[place].oh.us/      [place] Community Action  What they offer:  Rent and utility assistance     Phone Number: [phone]  Address: [address], [place] [address]   Website: https://www.wccsi.org/sitepages/HOME.html           Financial Resources        Emergency Rental Assistance:   Coalition on Homelessness and Housing in [place]: Cite provides list of Community Resources that assist with emergency rental assistance for all counties in [place]  https://cohhio.org/            How to apply for Medicaid or Health Insurance   Online:  Apply online at: https://benefits.[place].gov/    Phone:  By calling your area Job and Family Services:   https://jfs.[place].gov/about/local-agencies-directory/local-agencies-directory    Online:  Apply at Health Insurance Marketplace:     Mail or drop off a paper application to your local Department of Social   Services.  Applications can be downloaded and printed here: https://benefits.[place].gov/   Mailing may take longer than other methods of applying.   Find your nearest local Department of Social Services by visiting https://jfs.[place].gov/County/County_Directory.stm     Please contact your local job and family services or check online to get updates on the status of your application. Your local DJFS will not contact you with updates.                  [facility] [facility] Insurance Information Program (OSHIIP)   What they offer: The department's [place] [facility] Health Insurance Information Program (OSHIIP) provides Medicare beneficiaries with free, objective health insurance information, one-on-one counseling, and educates consumers about Medicare, Medicare prescription drug coverage (Part D), Medicare Advantage options, Medicare supplement insurance.  Website:   https://www.shiphelp.org/about-medicare/regional-ship-location/[place]   Phone Number: 1-800-686-1578    [place] Department of Insurance  What they offer: Provide consumer protection through education and fair but vigilant regulation while promoting a stable and competitive environment for insurers  Website:  https://insurance.[place].gov/   Phone Number: 614-644-2658    Contact your Area Agency on Aging for information regarding waiver services and Medicaid based assistance for seniors and those with complex medical conditions.        Area Agencies on Aging (AAA)  Council on Aging:   Counties Served: [place], [place], [place], [place], [place]  Address: [address], [place], [address] / Phone: [phone]  Supports Offered  Passport  [facility] waiver  Elderly Services Program (ESP)  [facility]Care [place]   Specialized Recovery Services   Assisted Living Waiver    Area on Aging District 7 (AAA7)  Counties Covered: [place], [place], [place], [place], [place], [place], [place], [place], [place], and [place]  Address: [address], [place] [address] / Phone: [phone]  Supports Offered:   Passport  Assisted Living Waiver  Consumer Directed Option Providers                 [place] Area Food Resources*  (Call 211 if need more resources.)          Feeding America   What they offer: Feeding America is a nationwide network of food banks, food pantries, and meal programs.  Website: https://www.feedingamerica.org/find-your-local-foodbank      National Hunger Hotline  What they offer: The hotline is a resource for individuals and families seeking information on how and where to obtain food.   Website:  https://www.hungerfreeamerica.org/en-us/[place]-national-hunger-hotline    Hunger Hotline Phone Number: 1-866-3-HUNGRY (1-866-348-6479)  Hours of Operation: Monday - Friday 7am to 10pm   The Hunger Hotline also operates a texting service. Our number is [phone]. Please note that these are automated texts and we do not reply or monitor  texts.   [employer]  What they offer: $1 for $1 matching for families receiving SNAP/food stamps when spent on "healthy" food.  The match money can be used to purchase fruits and vegetables so adds more healthy food choices for SNAP beneficiaries.   Contact: https://produceperks.org/locations/   SNAP (formerly Food Stamps)   What they offer: SNAP is used like cash to buy eligible food items from authorized retailers.  Apply for benefits online: https://jfs.[place].gov/ofam/foodstamps.stm     Apply for benefits by phone or in-person by visiting your local Job and Family Services. Locate your county's Job and family services by searching the directory at https://jfs..gov/County/County_Directory.stm           Meals on Wheels   What they offer: Meals on Wheels is a program   that delivers meals to individuals who have no reliable means for maintaining a healthy diet.       To Apply:   Ages 18-59:  Apply online: https://www.muchmorethanameal.org/  Apply by phone: [phone]    Ages 60+:   Council on Aging: [phone]      [employer]  What they offer: Serves neighbors in [place] through its network of Conferences (groups of volunteers based at a church) for assistance with needs such as food, clothing, furniture, rent, utilities, or beds.  Website: https://www.svdpcincinnati.org/get-help/   Phone: 513-562-8841

## 2023-03-15 NOTE — Progress Notes (Unsigned)
03/15/2023     [name] [name] (DOB:  1981/07/29) is a 42 y.o. female, here for evaluation of the following medical concerns:    Medication Refill  Pertinent negatives include no abdominal pain, arthralgias, chest pain, congestion, coughing, fatigue, fever, headaches, nausea, numbness, rash, sore throat or vomiting.       Today, the patient follows up for several chronic medical concerns.      PTSD/OCE/panic disorder- would like to start medication, she understands the risk, no SI, no thoughts of hurting others.      Neurophthy- hands,  right foot, has hx of DM II, still has numbness and tingling, has been on Gabapentin in the past and is requesting we start back on the medication.      DM II- patient s is a diabetic but is not on medication, She is asking for a  Glucometer, is not on insulin, is needing A1C checked today, hasn't kept a log of her glucose readings.      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  Stated that she has a fullness in her throat area, no problem swallowing, no allergic reaction, patient is very focused on this, no erythema noted, no edema noted .     Mixed obsessional thoughts and acts, agoraphobia, PTSD, anxiety, not following with a Psychiatrist at this time?, no side effects from the medication at this time, but needs to get in with someone as soon as possible.      Today, denied  Chest pain, sob, n, v, or diarrhea.     Review of Systems   Constitutional:  Negative for activity change, fatigue, fever and unexpected weight change.   HENT:  Negative for congestion, ear pain, facial swelling, mouth sores, rhinorrhea, sinus pressure, sore throat and trouble swallowing.    Eyes:  Negative for visual disturbance.   Respiratory:  Negative for cough, chest tightness, shortness of breath and wheezing.    Cardiovascular:  Negative for chest pain and leg swelling.   Gastrointestinal:  Negative for abdominal pain, diarrhea, nausea and vomiting.   Endocrine:  Negative for cold intolerance, heat intolerance, polydipsia and polyphagia.   Genitourinary:  Negative for dyspareunia, flank pain, frequency, hematuria, pelvic pain, urgency and vaginal bleeding.   Musculoskeletal:  Negative for arthralgias.   Skin:  Negative for rash.   Allergic/Immunologic: Negative for food allergies.   Neurological:  Negative for dizziness, tremors, syncope, numbness and headaches.   Hematological:  Does not bruise/bleed easily.   Psychiatric/Behavioral:  Negative for suicidal ideas. The patient is not nervous/anxious.        Prior to Visit Medications    Medication Sig Taking? Authorizing Provider   gabapentin (NEURONTIN) 100 MG capsule Take 1 capsule by mouth 3 times daily for 180 days. Intended supply: 30 days Yes Idell Hissong C, DO   busPIRone (BUSPAR) 10 MG tablet TAKE 1 TABLET BY MOUTH TWICE A DAY Yes Forrest Demuro C, DO   glucose monitoring (FREESTYLE FREEDOM) kit 1 kit by Does not apply route daily Yes Whitaker Holderman C, DO   blood glucose monitor strips Test 1 times a day & as needed for symptoms of irregular blood glucose. Dispense sufficient amount for indicated testing frequency plus additional to accommodate PRN testing needs. Yes Arles Rumbold C, DO   ONE TOUCH ULTRASOFT LANCETS MISC Use to test sugar once daily. Yes Psalms Olarte C, DO   acetaminophen (TYLENOL) 500 MG tablet Take 1 tablet by mouth every 6 hours as needed Yes [provider]   sertraline (ZOLOFT) 50 MG tablet Take 1 tablet by mouth daily  Patient not taking: Reported on 03/15/2023  Arman Bogus, DO        Social History     Tobacco Use    Smoking status: Former     Current packs/day: 0.50     Average packs/day: 0.5 packs/day for 11.0 years (5.5 ttl pk-yrs)     Types: Cigarettes    Smokeless tobacco: Never   Substance Use Topics    Alcohol use: Yes     Comment: every 2 months        Vitals:    03/15/23 1458   BP: 122/82   Weight: 58.1 kg (128 lb)   Height: 1.651 m (5\' 5" )     Estimated body mass index is  21.3 kg/m as calculated from the following:    Height as of this encounter: 1.651 m (5\' 5" ).    Weight as of this encounter: 58.1 kg (128 lb).    Physical Exam    ASSESSMENT/PLAN:  There are no diagnoses linked to this encounter.    No follow-ups on file.

## 2023-07-13 ENCOUNTER — Telehealth
Admit: 2023-07-13 | Discharge: 2023-07-13 | Payer: PRIVATE HEALTH INSURANCE | Attending: Family Medicine | Primary: Family Medicine

## 2023-07-13 DIAGNOSIS — U071 COVID-19: Secondary | ICD-10-CM

## 2023-07-13 MED ORDER — BENZONATATE 200 MG PO CAPS
200 | ORAL_CAPSULE | Freq: Three times a day (TID) | ORAL | 0 refills | Status: AC | PRN
Start: 2023-07-13 — End: 2023-07-23

## 2023-07-13 MED ORDER — GABAPENTIN 300 MG PO CAPS
300 | ORAL_CAPSULE | Freq: Two times a day (BID) | ORAL | 1 refills | Status: AC
Start: 2023-07-13 — End: 2024-01-09

## 2023-07-13 MED ORDER — GUAIFENESIN ER 600 MG PO TB12
600 | ORAL_TABLET | Freq: Two times a day (BID) | ORAL | 0 refills | Status: AC
Start: 2023-07-13 — End: 2023-07-28

## 2023-07-13 NOTE — Telephone Encounter (Signed)
 Call and switch to VV she doesn't have a ride and may have covid.  Please let her know.

## 2023-07-13 NOTE — Telephone Encounter (Signed)
 LVM for patient informing of VV

## 2023-07-13 NOTE — Progress Notes (Signed)
 07/13/2023    TELEHEALTH EVALUATION -- Audio/Visual    HPI:    [name] [name] (DOB:  1981/11/01) has requested an audio/video evaluation for the following concern(s):    COVID  Cough  Congestion  Fever and chills  Has had this for three days  Taking otc medication     Review of Systems   Constitutional:  Positive for activity change, fatigue and fever.   HENT:  Positive for congestion, sinus pressure and sinus pain.    Respiratory:  Positive for cough. Negative for shortness of breath.    Cardiovascular:  Negative for chest pain and palpitations.   Gastrointestinal:  Negative for abdominal pain, constipation, diarrhea and nausea.   Musculoskeletal:  Positive for arthralgias.       Prior to Visit Medications    Medication Sig Taking? Authorizing Provider   guaiFENesin  (MUCINEX ) 600 MG extended release tablet Take 1 tablet by mouth 2 times daily for 15 days Yes Sanna Porcaro C, DO   gabapentin  (NEURONTIN ) 300 MG capsule Take 1 capsule by mouth 2 times daily for 180 days. Intended supply: 90 days Yes Nashya Garlington C, DO   benzonatate  (TESSALON ) 200 MG capsule Take 1 capsule by mouth 3 times daily as needed for Cough Yes Braeley Buskey C, DO   FLUoxetine  (PROZAC ) 20 MG capsule Take 1 capsule by mouth daily  Litsy Epting C, DO   glucose monitoring (FREESTYLE FREEDOM) kit 1 kit by Does not apply route daily  Searra Carnathan C, DO   blood glucose monitor strips Test 1 times a day & as needed for symptoms of irregular blood glucose. Dispense sufficient amount for indicated testing frequency plus additional to accommodate PRN testing needs.  Zoejane Gaulin C, DO   ONE TOUCH ULTRASOFT LANCETS MISC Use to test sugar once daily.  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, cocaine, marijuana        Allergies   Allergen Reactions    Latex Itching, Rash and Other (See Comments)     Burning to the touch     Latex Rash    Cephalexin Anaphylaxis       PHYSICAL EXAMINATION:  [ INSTRUCTIONS:  [x]  Indicates a positive item  []  Indicates a negative item  -- DELETE ALL ITEMS NOT EXAMINED]  Vital Signs: (As obtained by patient/caregiver or practitioner observation)  See chart  Bld pressure-  Heart rate-    Respiratory rate-    Temperature-  Pulse oximetry-     Constitutional: [x]  Appears well-developed and well-nourished []  No apparent distress      []  Abnormal-   Mental status  [x]  Alert and awake  []  Oriented to person/place/time [] Able to follow commands      Eyes:  EOM    [x]   Normal  []   Abnormal-  Sclera  []   Normal  []  Abnormal -         Discharge []   None visible  []  Abnormal -    HENT:   [x]  Normocephalic, atraumatic.  []  Abnormal   []  Mouth/Throat: Mucous membranes are moist.     External Ears [x]  Normal  []  Abnormal-     Neck: [x]  No visualized mass     Pulmonary/Chest: [x]  Respiratory effort normal.  []  No visualized signs of difficulty breathing or respiratory distress        []  Abnormal-      Musculoskeletal:   [x]  Normal gait with no signs of ataxia         []  Normal range of motion of neck        []  Abnormal-       Neurological:        [x]  No Facial Asymmetry (Cranial nerve 7 motor function) (limited exam to video visit)          []  No gaze palsy        []  Abnormal-         Skin:        [x]  No significant exanthematous lesions or discoloration noted on facial skin         []  Abnormal-            Psychiatric:       [x]  Normal Affect []  No Hallucinations        []  Abnormal-     Other pertinent observable physical exam findings-      ASSESSMENT/PLAN:  1. COVID  Take medication as prescribed.   Push fluids and rest  Discussed conservative treatment  Discussed signs and symptoms for immediate evaluation in the ER  RTC if no improved.   Tylenol /Ibuprofen  for pain       No follow-ups on file.    [name] [name], was evaluated through a synchronous (real-time) audio-video encounter. The patient (or guardian if applicable) is aware that this is a billable service, which includes applicable co-pays. This Virtual Visit was conducted with patient's (and/or legal guardian's) consent. Patient identification was verified, and a caregiver was present when appropriate.   The patient was located at Home: [address]  [facility] [address]  Provider was located at [employer] (Appt Dept): [address] [place]  [address]  [place],  [place] [address]  Confirm you are appropriately licensed, registered, or certified to deliver care in the state where the patient is located as indicated above. If you are not or unsure, please re-schedule the visit: Yes, I confirm.       Total time spent on this encounter: Not billed by time    --Krystal JAYSON Lex, DO on 07/17/2023 at 11:31 AM    An electronic signature was used to authenticate this note.

## 2023-07-18 ENCOUNTER — Encounter: Payer: PRIVATE HEALTH INSURANCE | Attending: Obstetrics & Gynecology | Primary: Family Medicine

## 2023-09-02 ENCOUNTER — Encounter: Payer: PRIVATE HEALTH INSURANCE | Attending: Family Medicine | Primary: Family Medicine

## 2023-09-06 ENCOUNTER — Ambulatory Visit
Admit: 2023-09-06 | Discharge: 2023-09-06 | Payer: PRIVATE HEALTH INSURANCE | Attending: Family Medicine | Primary: Family Medicine

## 2023-09-06 DIAGNOSIS — E119 Type 2 diabetes mellitus without complications: Secondary | ICD-10-CM

## 2023-09-06 NOTE — Progress Notes (Unsigned)
09/06/2023     [name] [name] (DOB:  12-24-1980) is a 42 y.o. female, here for evaluation of the following medical concerns:    HPI  oday, the patient follows up for several chronic medical concerns.      PTSD/OCE/panic disorder- would like to start medication, she understands the risk, no SI, no thoughts of hurting others.      Neurophthy- hands,  right foot, has hx of DM II, still has numbness and tingling, has been on Gabapentin in the past and is requesting we start back on the medication.      DM II- patient s is a diabetic but is not on medication, She is asking for a  Glucometer, is not on insulin, is needing A1C checked today, hasn't kept a log of her glucose readings.      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  Stated that she has a fullness in her throat area, no problem swallowing, no allergic reaction, patient is very focused on this, no erythema noted, no edema noted .     Mixed obsessional thoughts and acts, agoraphobia, PTSD, anxiety, not following with a Psychiatrist at this time?, no side effects from the medication at this time, but needs to get in with someone as soon as possible.     Tobacco use     Today, denied  Chest pain, sob, n, v, or diarrhea.   Review of Systems    Prior to Visit Medications    Medication Sig Taking? Authorizing Provider   gabapentin (NEURONTIN) 300 MG capsule Take 1 capsule by mouth 2 times daily for 180 days. Intended supply: 90 days Yes Marqus Macphee C, DO   FLUoxetine (PROZAC) 20 MG capsule Take 1 capsule by mouth daily Yes Terrel Nesheiwat C, DO   glucose monitoring (FREESTYLE FREEDOM) kit 1 kit by Does not apply route daily Yes Rio Taber C, DO   blood glucose monitor strips Test 1 times a day & as needed for symptoms of irregular blood glucose. Dispense sufficient amount for indicated testing frequency plus additional to accommodate PRN testing needs. Yes Iveliz Garay C, DO   ONE TOUCH ULTRASOFT LANCETS MISC Use to test  sugar once daily. Yes Arman Bogus, DO   acetaminophen (TYLENOL) 500 MG tablet Take 1 tablet by mouth every 6 hours as needed Yes [provider]        Social History     Tobacco Use    Smoking status: Former     Current packs/day: 0.50     Average packs/day: 0.5 packs/day for 11.0 years (5.5 ttl pk-yrs)     Types: Cigarettes    Smokeless tobacco: Never   Substance Use Topics    Alcohol use: Yes     Comment: every 2 months        Vitals:    09/06/23 1126   BP: 122/82   Weight: 65.8 kg (145 lb)   Height: 1.651 m (5\' 5" )     Estimated body mass index is 24.13 kg/m as calculated from the following:    Height as of this encounter: 1.651 m (5\' 5" ).    Weight as of this encounter: 65.8 kg (145 lb).    Physical Exam    ASSESSMENT/PLAN:  There are no diagnoses linked to this encounter.    No follow-ups on file.

## 2023-09-17 ENCOUNTER — Inpatient Hospital Stay
Disposition: A | Discharge status: Home / Self Care | Payer: PRIVATE HEALTH INSURANCE | Admitting: Family Medicine | Primary: Family Medicine

## 2023-09-17 DIAGNOSIS — E119 Type 2 diabetes mellitus without complications: Secondary | ICD-10-CM

## 2023-09-26 ENCOUNTER — Encounter: Admit: 2023-09-26 | Payer: PRIVATE HEALTH INSURANCE | Admitting: Family Medicine | Primary: Family Medicine

## 2023-09-26 DIAGNOSIS — F410 Panic disorder [episodic paroxysmal anxiety] without agoraphobia: Principal | ICD-10-CM

## 2023-09-26 MED ORDER — HYDROXYZINE PAMOATE 25 MG PO CAPS
25 | ORAL_CAPSULE | Freq: Every day | ORAL | 0 refills | Status: AC
Start: 2023-09-26 — End: 2023-10-26

## 2023-09-26 MED ORDER — FLUOXETINE HCL 20 MG PO CAPS
20 | ORAL_CAPSULE | Freq: Every day | ORAL | 1 refills | Status: AC
Start: 2023-09-26 — End: ?

## 2023-09-26 NOTE — Progress Notes (Signed)
 09/26/2023    TELEHEALTH EVALUATION -- Audio/Visual    HPI:    [name] [name] (DOB:  03-Sep-1981) has requested an audio/video evaluation for the following concern(s):    Depression/anxiety  PTSD/OCE/panic disorder- would like to start medication, she u

## 2023-10-04 ENCOUNTER — Encounter: Admit: 2023-10-04 | Admitting: Family Medicine

## 2023-10-04 ENCOUNTER — Ambulatory Visit
Admit: 2023-10-04 | Discharge: 2023-10-05 | Disposition: A | Discharge status: Home / Self Care | Payer: PRIVATE HEALTH INSURANCE | Source: Ambulatory Visit | Attending: Family Medicine | Admitting: Family Medicine | Primary: Family Medicine

## 2023-10-04 DIAGNOSIS — D497 Neoplasm of unspecified behavior of endocrine glands and other parts of nervous system: Secondary | ICD-10-CM

## 2023-10-04 DIAGNOSIS — E119 Type 2 diabetes mellitus without complications: Secondary | ICD-10-CM

## 2023-10-04 LAB — POCT VENOUS
Est, Glom Filt Rate: >90 (ref >60)
POC Creatinine: 0.5 mg/dL — ABNORMAL LOW (ref 0.6–1.1)

## 2023-10-04 MED ORDER — GADOTERIDOL 279.3 MG/ML IV SOLN
279.3 | Freq: Once | INTRAVENOUS | Status: AC | PRN
Start: 2023-10-04 — End: 2023-10-04
  Administered 2023-10-04: 13:00:00 13 mL via INTRAVENOUS

## 2023-10-10 ENCOUNTER — Encounter: Admit: 2023-10-10 | Payer: PRIVATE HEALTH INSURANCE | Admitting: Family Medicine | Primary: Family Medicine

## 2023-10-10 DIAGNOSIS — M542 Cervicalgia: Secondary | ICD-10-CM

## 2023-10-10 MED ORDER — METHOCARBAMOL 750 MG PO TABS
750 | ORAL_TABLET | Freq: Three times a day (TID) | ORAL | 0 refills | Status: AC
Start: 2023-10-10 — End: 2023-10-20
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
## 2023-10-10 NOTE — Telephone Encounter (Signed)
 Spoke on phone

## 2023-11-17 ENCOUNTER — Telehealth
Admit: 2023-11-17 | Discharge: 2023-11-17 | Payer: PRIVATE HEALTH INSURANCE | Attending: Family Medicine | Primary: Family Medicine

## 2023-11-17 DIAGNOSIS — M542 Cervicalgia: Secondary | ICD-10-CM
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
## 2023-12-22 MED ORDER — HYDROXYZINE PAMOATE 25 MG PO CAPS
25 | ORAL_CAPSULE | Freq: Every day | ORAL | 0 refills | Status: AC
Start: 2023-12-22 — End: ?

## 2023-12-22 NOTE — Telephone Encounter (Signed)
 Medication:   Requested Prescriptions     Pending Prescriptions Disp Refills    hydrOXYzine pamoate (VISTARIL) 25 MG capsule [Pharmacy Med Name: HYDROXYZINE PAM 25 MG CAP] 60 capsule      Sig: TAKE 2 CAPSULES BY MOUTH DAILY        Last Filled:  09/26/23 # 60 refills 0    Patient Phone Number: [phone] (home)     Last appt: 11/17/2023   Next appt: Visit date not found    Last OARRS:        No data to display

## 2024-01-31 DEATH — deceased
